# Patient Record
Sex: Female | Born: 1940 | Race: White | Hispanic: Yes | Smoking: Never smoker
Health system: Southern US, Community
[De-identification: ages and names within clinical notes are randomized; demographics above are authoritative.]

## PROBLEM LIST (undated history)

## (undated) DIAGNOSIS — I241 Dressler's syndrome: Secondary | ICD-10-CM

## (undated) DIAGNOSIS — N3000 Acute cystitis without hematuria: Secondary | ICD-10-CM

## (undated) DIAGNOSIS — F419 Anxiety disorder, unspecified: Secondary | ICD-10-CM

## (undated) DIAGNOSIS — I2510 Atherosclerotic heart disease of native coronary artery without angina pectoris: Secondary | ICD-10-CM

## (undated) DIAGNOSIS — R079 Chest pain, unspecified: Secondary | ICD-10-CM

## (undated) DIAGNOSIS — I255 Ischemic cardiomyopathy: Secondary | ICD-10-CM

## (undated) DIAGNOSIS — E119 Type 2 diabetes mellitus without complications: Secondary | ICD-10-CM

## (undated) DIAGNOSIS — I2102 ST elevation (STEMI) myocardial infarction involving left anterior descending coronary artery: Secondary | ICD-10-CM

## (undated) DIAGNOSIS — I5041 Acute combined systolic (congestive) and diastolic (congestive) heart failure: Secondary | ICD-10-CM

## (undated) DIAGNOSIS — F329 Major depressive disorder, single episode, unspecified: Secondary | ICD-10-CM

## (undated) DIAGNOSIS — I509 Heart failure, unspecified: Secondary | ICD-10-CM

## (undated) DIAGNOSIS — I5023 Acute on chronic systolic (congestive) heart failure: Secondary | ICD-10-CM

## (undated) DIAGNOSIS — R0600 Dyspnea, unspecified: Secondary | ICD-10-CM

## (undated) DIAGNOSIS — H9190 Unspecified hearing loss, unspecified ear: Secondary | ICD-10-CM

## (undated) DIAGNOSIS — I214 Non-ST elevation (NSTEMI) myocardial infarction: Secondary | ICD-10-CM

## (undated) HISTORY — DX: Heart failure, unspecified: I50.9

## (undated) HISTORY — DX: Dressler's syndrome: I24.1

## (undated) HISTORY — DX: Acute cystitis without hematuria: N30.00

## (undated) HISTORY — DX: Major depressive disorder, single episode, unspecified: F32.9

## (undated) HISTORY — DX: ST elevation (STEMI) myocardial infarction involving left anterior descending coronary artery: I21.02

## (undated) HISTORY — DX: Acute on chronic systolic (congestive) heart failure: I50.23

## (undated) HISTORY — DX: Ischemic cardiomyopathy: I25.5

## (undated) HISTORY — DX: Acute combined systolic (congestive) and diastolic (congestive) heart failure: I50.41

## (undated) HISTORY — DX: Chest pain, unspecified: R07.9

## (undated) HISTORY — DX: Atherosclerotic heart disease of native coronary artery without angina pectoris: I25.10

## (undated) HISTORY — DX: Non-ST elevation (NSTEMI) myocardial infarction: I21.4

## (undated) HISTORY — DX: Anxiety disorder, unspecified: F41.9

## (undated) HISTORY — DX: Dyspnea, unspecified: R06.00

---

## 2002-06-28 DIAGNOSIS — E119 Type 2 diabetes mellitus without complications: Secondary | ICD-10-CM

## 2002-06-28 HISTORY — DX: Type 2 diabetes mellitus without complications: E11.9

## 2004-05-29 ENCOUNTER — Ambulatory Visit: Payer: Self-pay | Admitting: Family Medicine

## 2004-07-01 ENCOUNTER — Ambulatory Visit (HOSPITAL_COMMUNITY): Admission: RE | Admit: 2004-07-01 | Discharge: 2004-07-01 | Payer: Self-pay | Admitting: Family Medicine

## 2017-10-05 ENCOUNTER — Inpatient Hospital Stay (HOSPITAL_COMMUNITY)
Admission: EM | Disposition: A | Discharge status: Home Health | Payer: Self-pay | Source: Home / Self Care | Attending: Cardiovascular Disease

## 2017-10-05 ENCOUNTER — Encounter (HOSPITAL_COMMUNITY): Payer: Self-pay | Admitting: Cardiology

## 2017-10-05 ENCOUNTER — Inpatient Hospital Stay (HOSPITAL_COMMUNITY)
Admission: EM | Admit: 2017-10-05 | Discharge: 2017-10-24 | DRG: 246 | Disposition: A | Discharge status: Home Health | Payer: Medicaid Other | Attending: Cardiovascular Disease | Admitting: Cardiovascular Disease

## 2017-10-05 ENCOUNTER — Encounter (HOSPITAL_COMMUNITY)
Admission: EM | Disposition: A | Discharge status: Home Health | Payer: Self-pay | Source: Home / Self Care | Attending: Cardiovascular Disease

## 2017-10-05 DIAGNOSIS — H9190 Unspecified hearing loss, unspecified ear: Secondary | ICD-10-CM

## 2017-10-05 DIAGNOSIS — T502X5A Adverse effect of carbonic-anhydrase inhibitors, benzothiadiazides and other diuretics, initial encounter: Secondary | ICD-10-CM | POA: Diagnosis not present

## 2017-10-05 DIAGNOSIS — E876 Hypokalemia: Secondary | ICD-10-CM | POA: Diagnosis present

## 2017-10-05 DIAGNOSIS — I2102 ST elevation (STEMI) myocardial infarction involving left anterior descending coronary artery: Secondary | ICD-10-CM

## 2017-10-05 DIAGNOSIS — N179 Acute kidney failure, unspecified: Secondary | ICD-10-CM | POA: Diagnosis present

## 2017-10-05 DIAGNOSIS — D509 Iron deficiency anemia, unspecified: Secondary | ICD-10-CM | POA: Diagnosis present

## 2017-10-05 DIAGNOSIS — Z79899 Other long term (current) drug therapy: Secondary | ICD-10-CM | POA: Diagnosis not present

## 2017-10-05 DIAGNOSIS — R0789 Other chest pain: Secondary | ICD-10-CM | POA: Diagnosis not present

## 2017-10-05 DIAGNOSIS — E1122 Type 2 diabetes mellitus with diabetic chronic kidney disease: Secondary | ICD-10-CM | POA: Diagnosis present

## 2017-10-05 DIAGNOSIS — K828 Other specified diseases of gallbladder: Secondary | ICD-10-CM | POA: Diagnosis present

## 2017-10-05 DIAGNOSIS — I255 Ischemic cardiomyopathy: Secondary | ICD-10-CM | POA: Diagnosis present

## 2017-10-05 DIAGNOSIS — I358 Other nonrheumatic aortic valve disorders: Secondary | ICD-10-CM | POA: Diagnosis present

## 2017-10-05 DIAGNOSIS — I5023 Acute on chronic systolic (congestive) heart failure: Secondary | ICD-10-CM | POA: Diagnosis present

## 2017-10-05 DIAGNOSIS — I5021 Acute systolic (congestive) heart failure: Secondary | ICD-10-CM | POA: Diagnosis not present

## 2017-10-05 DIAGNOSIS — E785 Hyperlipidemia, unspecified: Secondary | ICD-10-CM | POA: Diagnosis present

## 2017-10-05 DIAGNOSIS — N289 Disorder of kidney and ureter, unspecified: Secondary | ICD-10-CM | POA: Diagnosis not present

## 2017-10-05 DIAGNOSIS — I959 Hypotension, unspecified: Secondary | ICD-10-CM | POA: Diagnosis present

## 2017-10-05 DIAGNOSIS — N184 Chronic kidney disease, stage 4 (severe): Secondary | ICD-10-CM | POA: Diagnosis not present

## 2017-10-05 DIAGNOSIS — Z7984 Long term (current) use of oral hypoglycemic drugs: Secondary | ICD-10-CM

## 2017-10-05 DIAGNOSIS — I241 Dressler's syndrome: Secondary | ICD-10-CM | POA: Diagnosis not present

## 2017-10-05 DIAGNOSIS — R0600 Dyspnea, unspecified: Secondary | ICD-10-CM | POA: Diagnosis present

## 2017-10-05 DIAGNOSIS — D631 Anemia in chronic kidney disease: Secondary | ICD-10-CM | POA: Diagnosis present

## 2017-10-05 DIAGNOSIS — I214 Non-ST elevation (NSTEMI) myocardial infarction: Secondary | ICD-10-CM | POA: Diagnosis present

## 2017-10-05 DIAGNOSIS — I2510 Atherosclerotic heart disease of native coronary artery without angina pectoris: Secondary | ICD-10-CM | POA: Diagnosis present

## 2017-10-05 DIAGNOSIS — E222 Syndrome of inappropriate secretion of antidiuretic hormone: Secondary | ICD-10-CM | POA: Diagnosis present

## 2017-10-05 DIAGNOSIS — R109 Unspecified abdominal pain: Secondary | ICD-10-CM | POA: Diagnosis not present

## 2017-10-05 DIAGNOSIS — R1011 Right upper quadrant pain: Secondary | ICD-10-CM

## 2017-10-05 DIAGNOSIS — I252 Old myocardial infarction: Secondary | ICD-10-CM | POA: Diagnosis present

## 2017-10-05 DIAGNOSIS — R05 Cough: Secondary | ICD-10-CM

## 2017-10-05 DIAGNOSIS — R059 Cough, unspecified: Secondary | ICD-10-CM

## 2017-10-05 DIAGNOSIS — N170 Acute kidney failure with tubular necrosis: Secondary | ICD-10-CM | POA: Diagnosis not present

## 2017-10-05 DIAGNOSIS — R1013 Epigastric pain: Secondary | ICD-10-CM | POA: Diagnosis not present

## 2017-10-05 DIAGNOSIS — R1010 Upper abdominal pain, unspecified: Secondary | ICD-10-CM | POA: Diagnosis not present

## 2017-10-05 DIAGNOSIS — R0602 Shortness of breath: Secondary | ICD-10-CM | POA: Diagnosis not present

## 2017-10-05 DIAGNOSIS — Z955 Presence of coronary angioplasty implant and graft: Secondary | ICD-10-CM

## 2017-10-05 DIAGNOSIS — E871 Hypo-osmolality and hyponatremia: Secondary | ICD-10-CM

## 2017-10-05 DIAGNOSIS — I213 ST elevation (STEMI) myocardial infarction of unspecified site: Secondary | ICD-10-CM | POA: Diagnosis present

## 2017-10-05 DIAGNOSIS — D649 Anemia, unspecified: Secondary | ICD-10-CM | POA: Diagnosis not present

## 2017-10-05 HISTORY — PX: LEFT HEART CATH AND CORONARY ANGIOGRAPHY: CATH118249

## 2017-10-05 HISTORY — DX: Type 2 diabetes mellitus without complications: E11.9

## 2017-10-05 HISTORY — PX: CORONARY/GRAFT ACUTE MI REVASCULARIZATION: CATH118305

## 2017-10-05 HISTORY — DX: Unspecified hearing loss, unspecified ear: H91.90

## 2017-10-05 HISTORY — DX: ST elevation (STEMI) myocardial infarction involving left anterior descending coronary artery: I21.02

## 2017-10-05 HISTORY — DX: Ischemic cardiomyopathy: I25.5

## 2017-10-05 HISTORY — DX: Dyspnea, unspecified: R06.00

## 2017-10-05 LAB — POCT I-STAT, CHEM 8
BUN: 28 mg/dL — AB (ref 6–20)
CREATININE: 1.2 mg/dL — AB (ref 0.44–1.00)
Calcium, Ion: 1.11 mmol/L — ABNORMAL LOW (ref 1.15–1.40)
Chloride: 85 mmol/L — ABNORMAL LOW (ref 101–111)
Glucose, Bld: 295 mg/dL — ABNORMAL HIGH (ref 65–99)
HCT: 29 % — ABNORMAL LOW (ref 36.0–46.0)
Hemoglobin: 9.9 g/dL — ABNORMAL LOW (ref 12.0–15.0)
POTASSIUM: 5.5 mmol/L — AB (ref 3.5–5.1)
Sodium: 116 mmol/L — CL (ref 135–145)
TCO2: 20 mmol/L — ABNORMAL LOW (ref 22–32)

## 2017-10-05 LAB — COMPREHENSIVE METABOLIC PANEL
ALT: 13 U/L — ABNORMAL LOW (ref 14–54)
AST: 15 U/L (ref 15–41)
Albumin: 2 g/dL — ABNORMAL LOW (ref 3.5–5.0)
Alkaline Phosphatase: 47 U/L (ref 38–126)
Anion gap: 20 — ABNORMAL HIGH (ref 5–15)
BUN: 21 mg/dL — ABNORMAL HIGH (ref 6–20)
CO2: 11 mmol/L — ABNORMAL LOW (ref 22–32)
Calcium: 5.3 mg/dL — CL (ref 8.9–10.3)
Chloride: 102 mmol/L (ref 101–111)
Creatinine, Ser: 0.77 mg/dL (ref 0.44–1.00)
GFR calc Af Amer: >60 mL/min (ref >60)
GFR calc non Af Amer: >60 mL/min (ref >60)
Glucose, Bld: 216 mg/dL — ABNORMAL HIGH (ref 65–99)
Potassium: 3.2 mmol/L — ABNORMAL LOW (ref 3.5–5.1)
Sodium: 133 mmol/L — ABNORMAL LOW (ref 135–145)
Total Bilirubin: 0.3 mg/dL (ref 0.3–1.2)
Total Protein: 4.3 g/dL — ABNORMAL LOW (ref 6.5–8.1)

## 2017-10-05 LAB — CBC WITH DIFFERENTIAL/PLATELET
Basophils Absolute: 0 10*3/uL (ref 0.0–0.1)
Basophils Relative: 0 %
Eosinophils Absolute: 0 10*3/uL (ref 0.0–0.7)
Eosinophils Relative: 0 %
HCT: 27.6 % — ABNORMAL LOW (ref 36.0–46.0)
Hemoglobin: 9.6 g/dL — ABNORMAL LOW (ref 12.0–15.0)
Lymphocytes Relative: 17 %
Lymphs Abs: 1.6 10*3/uL (ref 0.7–4.0)
MCH: 27.7 pg (ref 26.0–34.0)
MCHC: 34.8 g/dL (ref 30.0–36.0)
MCV: 79.5 fL (ref 78.0–100.0)
Monocytes Absolute: 0.4 10*3/uL (ref 0.1–1.0)
Monocytes Relative: 5 %
Neutro Abs: 7.4 10*3/uL (ref 1.7–7.7)
Neutrophils Relative %: 78 %
Platelets: 493 10*3/uL — ABNORMAL HIGH (ref 150–400)
RBC: 3.47 MIL/uL — ABNORMAL LOW (ref 3.87–5.11)
RDW: 13 % (ref 11.5–15.5)
WBC: 9.4 10*3/uL (ref 4.0–10.5)

## 2017-10-05 LAB — CBC
HEMATOCRIT: 27 % — AB (ref 36.0–46.0)
Hemoglobin: 9.4 g/dL — ABNORMAL LOW (ref 12.0–15.0)
MCH: 27.6 pg (ref 26.0–34.0)
MCHC: 34.8 g/dL (ref 30.0–36.0)
MCV: 79.4 fL (ref 78.0–100.0)
PLATELETS: 485 10*3/uL — AB (ref 150–400)
RBC: 3.4 MIL/uL — ABNORMAL LOW (ref 3.87–5.11)
RDW: 13 % (ref 11.5–15.5)
WBC: 8.5 10*3/uL (ref 4.0–10.5)

## 2017-10-05 LAB — PROTIME-INR
INR: 1.02
Prothrombin Time: 13.3 seconds (ref 11.4–15.2)

## 2017-10-05 LAB — LIPID PANEL
CHOLESTEROL: 157 mg/dL (ref 0–200)
HDL: 47 mg/dL (ref >40)
LDL Cholesterol: 86 mg/dL (ref 0–99)
Total CHOL/HDL Ratio: 3.3 RATIO
Triglycerides: 119 mg/dL (ref <150)
VLDL: 24 mg/dL (ref 0–40)

## 2017-10-05 LAB — MRSA PCR SCREENING: MRSA by PCR: NEGATIVE

## 2017-10-05 LAB — HEMOGLOBIN A1C
Hgb A1c MFr Bld: 7.6 % — ABNORMAL HIGH (ref 4.8–5.6)
Hgb A1c MFr Bld: 6.6 % — ABNORMAL HIGH (ref 4.8–5.6)
MEAN PLASMA GLUCOSE: 171.42 mg/dL
Mean Plasma Glucose: 142.72 mg/dL

## 2017-10-05 LAB — POCT ACTIVATED CLOTTING TIME
ACTIVATED CLOTTING TIME: 186 s
Activated Clotting Time: 290 seconds

## 2017-10-05 LAB — TROPONIN I
Troponin I: 1.46 ng/mL (ref <0.03)
Troponin I: 1.2 ng/mL (ref <0.03)

## 2017-10-05 LAB — TSH: TSH: 3.377 u[IU]/mL (ref 0.350–4.500)

## 2017-10-05 LAB — GLUCOSE, CAPILLARY: Glucose-Capillary: 303 mg/dL — ABNORMAL HIGH (ref 65–99)

## 2017-10-05 LAB — APTT: APTT: 29 s (ref 24–36)

## 2017-10-05 LAB — BRAIN NATRIURETIC PEPTIDE: B Natriuretic Peptide: 3798.7 pg/mL — ABNORMAL HIGH (ref 0.0–100.0)

## 2017-10-05 SURGERY — LEFT HEART CATH AND CORONARY ANGIOGRAPHY
Anesthesia: LOCAL

## 2017-10-05 MED ORDER — HEPARIN (PORCINE) IN NACL 2-0.9 UNIT/ML-% IJ SOLN
INTRAMUSCULAR | Status: AC | PRN
  Administered 2017-10-05 (×2): 500 mL

## 2017-10-05 MED ORDER — NITROGLYCERIN 0.4 MG SL SUBL
0.4000 mg | SUBLINGUAL_TABLET | SUBLINGUAL | Status: DC | PRN
  Administered 2017-10-08 – 2017-10-09 (×2): 0.4 mg via SUBLINGUAL
  Filled 2017-10-05 (×3): qty 1

## 2017-10-05 MED ORDER — TIROFIBAN (AGGRASTAT) BOLUS VIA INFUSION
INTRAVENOUS | Status: DC | PRN
  Administered 2017-10-05: 1500 ug via INTRAVENOUS

## 2017-10-05 MED ORDER — ONDANSETRON HCL 4 MG/2ML IJ SOLN
4.0000 mg | Freq: Four times a day (QID) | INTRAMUSCULAR | Status: DC | PRN
  Administered 2017-10-08 – 2017-10-09 (×2): 4 mg via INTRAVENOUS
  Filled 2017-10-05 (×2): qty 2

## 2017-10-05 MED ORDER — LIDOCAINE HCL (PF) 1 % IJ SOLN
INTRAMUSCULAR | Status: DC | PRN
  Administered 2017-10-05: 15 mL via INTRADERMAL

## 2017-10-05 MED ORDER — ZOLPIDEM TARTRATE 5 MG PO TABS
5.0000 mg | ORAL_TABLET | Freq: Every evening | ORAL | Status: DC | PRN
  Administered 2017-10-13 – 2017-10-24 (×2): 5 mg via ORAL
  Filled 2017-10-05 (×2): qty 1

## 2017-10-05 MED ORDER — IOPAMIDOL (ISOVUE-370) INJECTION 76%
INTRAVENOUS | Status: AC
  Filled 2017-10-05: qty 125

## 2017-10-05 MED ORDER — ATORVASTATIN CALCIUM 80 MG PO TABS: 80.0000 mg | ORAL_TABLET | Freq: Every day | ORAL | Status: DC

## 2017-10-05 MED ORDER — TIROFIBAN HCL IN NACL 5-0.9 MG/100ML-% IV SOLN
INTRAVENOUS | Status: AC
  Filled 2017-10-05: qty 100

## 2017-10-05 MED ORDER — HYDRALAZINE HCL 20 MG/ML IJ SOLN: 5.0000 mg | INTRAMUSCULAR | Status: AC | PRN

## 2017-10-05 MED ORDER — INSULIN ASPART 100 UNIT/ML ~~LOC~~ SOLN
0.0000 [IU] | Freq: Every day | SUBCUTANEOUS | Status: DC
  Administered 2017-10-05: 4 [IU] via SUBCUTANEOUS
  Administered 2017-10-06: 2 [IU] via SUBCUTANEOUS

## 2017-10-05 MED ORDER — LIDOCAINE HCL (PF) 1 % IJ SOLN
INTRAMUSCULAR | Status: AC
  Filled 2017-10-05: qty 30

## 2017-10-05 MED ORDER — FUROSEMIDE 10 MG/ML IJ SOLN
INTRAMUSCULAR | Status: AC
  Filled 2017-10-05: qty 4

## 2017-10-05 MED ORDER — ATORVASTATIN CALCIUM 80 MG PO TABS
80.0000 mg | ORAL_TABLET | Freq: Every day | ORAL | Status: DC
  Administered 2017-10-06 – 2017-10-23 (×16): 80 mg via ORAL
  Filled 2017-10-05 (×16): qty 1

## 2017-10-05 MED ORDER — ASPIRIN 81 MG PO CHEW
81.0000 mg | CHEWABLE_TABLET | Freq: Every day | ORAL | Status: DC
  Administered 2017-10-06 – 2017-10-24 (×19): 81 mg via ORAL
  Filled 2017-10-05 (×19): qty 1

## 2017-10-05 MED ORDER — HEPARIN (PORCINE) IN NACL 2-0.9 UNIT/ML-% IJ SOLN
INTRAMUSCULAR | Status: AC
  Filled 2017-10-05: qty 1000

## 2017-10-05 MED ORDER — FENTANYL CITRATE (PF) 100 MCG/2ML IJ SOLN
INTRAMUSCULAR | Status: DC | PRN
  Administered 2017-10-05 (×2): 25 ug via INTRAVENOUS

## 2017-10-05 MED ORDER — TICAGRELOR 90 MG PO TABS
ORAL_TABLET | ORAL | Status: DC | PRN
  Administered 2017-10-05: 180 mg via ORAL

## 2017-10-05 MED ORDER — BIVALIRUDIN TRIFLUOROACETATE 250 MG IV SOLR
INTRAVENOUS | Status: AC
  Filled 2017-10-05: qty 250

## 2017-10-05 MED ORDER — MIDAZOLAM HCL 2 MG/2ML IJ SOLN
INTRAMUSCULAR | Status: DC | PRN
  Administered 2017-10-05 (×2): 1 mg via INTRAVENOUS

## 2017-10-05 MED ORDER — BIVALIRUDIN BOLUS VIA INFUSION - CUPID
INTRAVENOUS | Status: DC | PRN
  Administered 2017-10-05: 41.25 mg via INTRAVENOUS

## 2017-10-05 MED ORDER — ACETAMINOPHEN 325 MG PO TABS
650.0000 mg | ORAL_TABLET | ORAL | Status: DC | PRN
  Administered 2017-10-12 – 2017-10-23 (×4): 650 mg via ORAL
  Filled 2017-10-05 (×4): qty 2

## 2017-10-05 MED ORDER — FENTANYL CITRATE (PF) 100 MCG/2ML IJ SOLN
INTRAMUSCULAR | Status: AC
  Filled 2017-10-05: qty 2

## 2017-10-05 MED ORDER — ZOLPIDEM TARTRATE 5 MG PO TABS: 5.0000 mg | ORAL_TABLET | Freq: Every evening | ORAL | Status: DC | PRN

## 2017-10-05 MED ORDER — NITROGLYCERIN 1 MG/10 ML FOR IR/CATH LAB
INTRA_ARTERIAL | Status: AC
  Filled 2017-10-05: qty 10

## 2017-10-05 MED ORDER — TIROFIBAN HCL IN NACL 5-0.9 MG/100ML-% IV SOLN
INTRAVENOUS | Status: AC | PRN
  Administered 2017-10-05: 0.075 ug/kg/min via INTRAVENOUS

## 2017-10-05 MED ORDER — ONDANSETRON HCL 4 MG/2ML IJ SOLN: 4.0000 mg | Freq: Four times a day (QID) | INTRAMUSCULAR | Status: DC | PRN

## 2017-10-05 MED ORDER — TICAGRELOR 90 MG PO TABS
ORAL_TABLET | ORAL | Status: AC
  Filled 2017-10-05: qty 4

## 2017-10-05 MED ORDER — LABETALOL HCL 5 MG/ML IV SOLN: 10.0000 mg | INTRAVENOUS | Status: AC | PRN

## 2017-10-05 MED ORDER — ASPIRIN EC 81 MG PO TBEC: 81.0000 mg | DELAYED_RELEASE_TABLET | Freq: Every day | ORAL | Status: DC

## 2017-10-05 MED ORDER — VERAPAMIL HCL 2.5 MG/ML IV SOLN
INTRAVENOUS | Status: DC | PRN
Start: 2017-10-05 — End: 2017-10-05
  Administered 2017-10-05: 200 ug via INTRACORONARY

## 2017-10-05 MED ORDER — ALPRAZOLAM 0.25 MG PO TABS
0.2500 mg | ORAL_TABLET | Freq: Two times a day (BID) | ORAL | Status: DC | PRN
  Administered 2017-10-05: 0.25 mg via ORAL
  Filled 2017-10-05: qty 1

## 2017-10-05 MED ORDER — NITROGLYCERIN IN D5W 200-5 MCG/ML-% IV SOLN
INTRAVENOUS | Status: AC | PRN
  Administered 2017-10-05: 10 ug/min via INTRAVENOUS

## 2017-10-05 MED ORDER — SODIUM CHLORIDE 0.9% FLUSH: 3.0000 mL | INTRAVENOUS | Status: DC | PRN

## 2017-10-05 MED ORDER — SODIUM CHLORIDE 0.9 % IV SOLN: INTRAVENOUS | Status: AC

## 2017-10-05 MED ORDER — NITROGLYCERIN 1 MG/10 ML FOR IR/CATH LAB
INTRA_ARTERIAL | Status: DC | PRN
  Administered 2017-10-05 (×3): 200 ug via INTRACORONARY
  Administered 2017-10-05: 150 ug via INTRACORONARY

## 2017-10-05 MED ORDER — ACETAMINOPHEN 325 MG PO TABS: 650.0000 mg | ORAL_TABLET | ORAL | Status: DC | PRN

## 2017-10-05 MED ORDER — DIAZEPAM 5 MG PO TABS
5.0000 mg | ORAL_TABLET | Freq: Four times a day (QID) | ORAL | Status: DC | PRN
  Administered 2017-10-06 – 2017-10-15 (×9): 5 mg via ORAL
  Filled 2017-10-05 (×10): qty 1

## 2017-10-05 MED ORDER — SODIUM CHLORIDE 0.9% FLUSH
3.0000 mL | Freq: Two times a day (BID) | INTRAVENOUS | Status: DC
  Administered 2017-10-05 – 2017-10-18 (×24): 3 mL via INTRAVENOUS
  Administered 2017-10-19: 21:00:00 via INTRAVENOUS
  Administered 2017-10-20 – 2017-10-24 (×7): 3 mL via INTRAVENOUS

## 2017-10-05 MED ORDER — FUROSEMIDE 10 MG/ML IJ SOLN
INTRAMUSCULAR | Status: DC | PRN
  Administered 2017-10-05: 20 mg via INTRAVENOUS

## 2017-10-05 MED ORDER — TICAGRELOR 90 MG PO TABS
90.0000 mg | ORAL_TABLET | Freq: Two times a day (BID) | ORAL | Status: DC
  Administered 2017-10-06 – 2017-10-24 (×36): 90 mg via ORAL
  Filled 2017-10-05 (×38): qty 1

## 2017-10-05 MED ORDER — MIDAZOLAM HCL 2 MG/2ML IJ SOLN
INTRAMUSCULAR | Status: AC
  Filled 2017-10-05: qty 2

## 2017-10-05 MED ORDER — SODIUM CHLORIDE 0.9 % IV SOLN: 250.0000 mL | INTRAVENOUS | Status: DC | PRN

## 2017-10-05 MED ORDER — NITROGLYCERIN IN D5W 200-5 MCG/ML-% IV SOLN
0.0000 ug/min | INTRAVENOUS | Status: DC
  Filled 2017-10-05: qty 250

## 2017-10-05 MED ORDER — SODIUM CHLORIDE 0.9 % IV SOLN
INTRAVENOUS | Status: AC | PRN
  Administered 2017-10-05: 1.75 mg/kg/h via INTRAVENOUS

## 2017-10-05 MED ORDER — SODIUM CHLORIDE 0.9% FLUSH
3.0000 mL | INTRAVENOUS | Status: DC | PRN
  Administered 2017-10-22: 3 mL via INTRAVENOUS
  Filled 2017-10-05: qty 3

## 2017-10-05 MED ORDER — INSULIN ASPART 100 UNIT/ML ~~LOC~~ SOLN
0.0000 [IU] | Freq: Three times a day (TID) | SUBCUTANEOUS | Status: DC
  Administered 2017-10-06: 3 [IU] via SUBCUTANEOUS
  Administered 2017-10-06: 2 [IU] via SUBCUTANEOUS
  Administered 2017-10-07: 3 [IU] via SUBCUTANEOUS
  Administered 2017-10-07 – 2017-10-08 (×3): 1 [IU] via SUBCUTANEOUS
  Administered 2017-10-09 – 2017-10-10 (×2): 2 [IU] via SUBCUTANEOUS
  Administered 2017-10-10 (×2): 1 [IU] via SUBCUTANEOUS
  Administered 2017-10-11 – 2017-10-13 (×5): 2 [IU] via SUBCUTANEOUS
  Administered 2017-10-14: 5 [IU] via SUBCUTANEOUS
  Administered 2017-10-15 – 2017-10-17 (×5): 2 [IU] via SUBCUTANEOUS
  Administered 2017-10-17: 3 [IU] via SUBCUTANEOUS
  Administered 2017-10-18: 2 [IU] via SUBCUTANEOUS
  Administered 2017-10-18: 1 [IU] via SUBCUTANEOUS
  Administered 2017-10-18 – 2017-10-19 (×2): 2 [IU] via SUBCUTANEOUS
  Administered 2017-10-19: 3 [IU] via SUBCUTANEOUS
  Administered 2017-10-19: 1 [IU] via SUBCUTANEOUS
  Administered 2017-10-20: 2 [IU] via SUBCUTANEOUS
  Administered 2017-10-20: 3 [IU] via SUBCUTANEOUS
  Administered 2017-10-20 – 2017-10-21 (×2): 2 [IU] via SUBCUTANEOUS
  Administered 2017-10-21: 1 [IU] via SUBCUTANEOUS
  Administered 2017-10-21 – 2017-10-22 (×4): 2 [IU] via SUBCUTANEOUS
  Administered 2017-10-23: 1 [IU] via SUBCUTANEOUS
  Administered 2017-10-23: 2 [IU] via SUBCUTANEOUS
  Administered 2017-10-23 – 2017-10-24 (×2): 1 [IU] via SUBCUTANEOUS

## 2017-10-05 MED ORDER — METOPROLOL TARTRATE 12.5 MG HALF TABLET
12.5000 mg | ORAL_TABLET | Freq: Two times a day (BID) | ORAL | Status: DC
  Administered 2017-10-05 – 2017-10-19 (×28): 12.5 mg via ORAL
  Filled 2017-10-05 (×28): qty 1

## 2017-10-05 SURGICAL SUPPLY — 19 items
BALLN SAPPHIRE 2.0X12 (BALLOONS) ×2
BALLN SAPPHIRE 2.5X20 (BALLOONS) ×2
BALLN ~~LOC~~ EUPHORA RX 2.75X15 (BALLOONS) ×2
BALLOON SAPPHIRE 2.0X12 (BALLOONS) IMPLANT
BALLOON SAPPHIRE 2.5X20 (BALLOONS) IMPLANT
BALLOON ~~LOC~~ EUPHORA RX 2.75X15 (BALLOONS) IMPLANT
CATH INFINITI 5FR MULTPACK ANG (CATHETERS) ×1 IMPLANT
CATH VISTA GUIDE 6FR XBLAD3.5 (CATHETERS) ×1 IMPLANT
ELECT DEFIB PAD ADLT CADENCE (PAD) ×1 IMPLANT
KIT ENCORE 26 ADVANTAGE (KITS) ×1 IMPLANT
KIT HEART LEFT (KITS) ×2 IMPLANT
PACK CARDIAC CATHETERIZATION (CUSTOM PROCEDURE TRAY) ×2 IMPLANT
SHEATH AVANTI 11CM 6FR (SHEATH) ×1 IMPLANT
STENT RESOLUTE ONYX 2.25X12 (Permanent Stent) ×1 IMPLANT
STENT RESOLUTE ONYX 2.5X26 (Permanent Stent) ×1 IMPLANT
TRANSDUCER W/STOPCOCK (MISCELLANEOUS) ×2 IMPLANT
TUBING CIL FLEX 10 FLL-RA (TUBING) ×2 IMPLANT
WIRE EMERALD 3MM-J .035X150CM (WIRE) ×1 IMPLANT
WIRE PT2 MS 185 (WIRE) ×1 IMPLANT

## 2017-10-05 NOTE — H&P (Addendum)
Cardiology Admission History and Physical:   Patient ID: Sheila White; MRN: [id number]; DOB: 1941-03-31   Admission date: 10/05/2017  Primary Care Provider: No primary care provider on file. Primary Cardiologist: No primary care provider on file.  Chief Complaint:  SOB  Patient Profile:   Sheila White is a 77 y.o.Hispanic female who presented to the ED with dyspnea via EMS.  History of Present Illness:   Sheila. Sheila White does not speak English and is hard of hearing. Her history is obtained from interview with the family and information provided from the patient. Sheila White began feeling poorly a week ago. She was seen by her PCP and told she was "Ok". She continued to feel poorly-SOB and Rt back pain. Her daughter brought her back to her PCP yesterday and she was placed on ABs for suspected CAP. Around 5 am this morning the patient developed back pain that radiated to her chest. This lasted till around 10 am. After this the pain eased but she was increasingly SOB and EMS was called when it was apparent she couldn't walk to her car to go to the doctor's office.   In the ED her EKG shows anterior Qs with anterior ST elevation. She was examined and interviewed by Dr Tresa Endo in the ED. NTG and O2 provided by EMS did help her symptoms but she was still SOB at rest. It was decided to take her up to the cath lab fas a STEMI for further evaluation.   Past Medical History:  Diagnosis Date  . HOH (hard of hearing)   . Non-insulin dependent type 2 diabetes mellitus (HCC)   -The patient denied any serious medical problems  History reviewed. No pertinent surgical history. The pt denies any major surgery  Medications Prior to Admission: Prior to Admission medications  Glucophage Glimeperide 2mg    Not on File     Allergies:   No Known Allergies  Social History:   Widowed 2 years   Family History: no family history of early CAD   ROS:  Please see the history of present illness.  No history of  bleeding issues All other ROS reviewed and negative.     Physical Exam/Data:  There were no vitals filed for this visit. No intake or output data in the 24 hours ending 10/05/17 1814 There were no vitals filed for this visit. There is no height or weight on file to calculate BMI.  General:  Well nourished, well developed, in moderate distress HEENT: normal Lymph: no adenopathy Neck: noJVD Endocrine:  No thryomegaly Vascular: No carotid bruits; FA pulses 2+ bilaterally without bruits  Cardiac:  normal S1, S2; RRR; no murmur  Lungs:  Decreased breath sounds Abd: soft, nontender, no hepatomegaly  Ext: no edema Musculoskeletal:  No deformities, BUE and BLE strength normal and equal Skin: warm and dry  Neuro:  CNs 2-12 intact, no focal abnormalities noted Psych:  Normal affect    EKG:  The ECG that was done by EMS was personally reviewed and demonstrates NSR with anterior Qs and ST elevation  Relevant CV Studies:  Laboratory Data:  ChemistryNo results for input(s): NA, K, CL, CO2, GLUCOSE, BUN, CREATININE, CALCIUM, GFRNONAA, GFRAA, ANIONGAP in the last 168 hours.  No results for input(s): PROT, ALBUMIN, AST, ALT, ALKPHOS, BILITOT in the last 168 hours. HematologyNo results for input(s): WBC, RBC, HGB, HCT, MCV, MCH, MCHC, RDW, PLT in the last 168 hours. Cardiac EnzymesNo results for input(s): TROPONINI in the last 168 hours. No results for input(s):  TROPIPOC in the last 168 hours.  BNPNo results for input(s): BNP, PROBNP in the last 168 hours.  DDimer No results for input(s): DDIMER in the last 168 hours.  Radiology/Studies:  No results found.  Assessment and Plan:   Anterior STEMI- late presentation  NIDDM- on oral agents  HOH  Plan: Urgent cath  Severity of Illness: The appropriate patient status for this patient is INPATIENT. Inpatient status is judged to be reasonable and necessary in order to provide the required intensity of service to ensure the patient's safety.  The patient's presenting symptoms, physical exam findings, and initial radiographic and laboratory data in the context of their chronic comorbidities is felt to place them at high risk for further clinical deterioration. Furthermore, it is not anticipated that the patient will be medically stable for discharge from the hospital within 2 midnights of admission. The following factors support the patient status of inpatient.   " The patient's presenting symptoms include SOB . " The worrisome physical exam findings include decreased breath breath sounds. " The initial radiographic and laboratory data are worrisome because of EKG-anterior Qs, anterior ST evevation. " The chronic co-morbidities include Diabtetes.   * I certify that at the point of admission it is my clinical judgment that the patient will require inpatient hospital care spanning beyond 2 midnights from the point of admission due to high intensity of service, high risk for further deterioration and high frequency of surveillance required.*    For questions or updates, please contact [facility] Please consult [email or web address] for contact info under Cardiology/STEMI.    Sheila ProvostSigned, Luke Kilroy, PA-C  10/05/2017 6:14 PM    Patient seen and examined. Agree with assessment and plan.  Sheila White is a 77 year old Hispanic female who was originally from [place].  She has a history of diabetes mellitus.  She developed increasing fatigability approximately 1 week ago.  Yesterday and this morning she developed chest and back discomfort.  She has significant back pain rating to her chest this morning for a proximally 5 hours.  Throughout the day.  She was noticing more shortness of breath.  [facility]  She was complaining of significant shortness of breath with residual upper abdominal/chest discomfort.  Her ECG has shown anterior Q waves with residual ST elevation suggesting delayed presentation LAD ST segment elevation MI.   With ongoing symptomatology she is now taken acutely to the cardiac catheterization laboratory.     Lennette Biharihomas A. Keller Bounds, MD, [facility]LL[facility] HospitalFACC 10/05/2017 7:29 PM

## 2017-10-05 NOTE — Progress Notes (Signed)
Orthopedic Tech Progress Note Patient Details:  Sheila White [birth year][name]940 [id number]  Ortho Devices Type of Ortho Device: Knee Immobilizer Ortho Device/Splint Location: delivered to rn Ortho Device/Splint Interventions: Sheila BraveOrdered       Sheila White 10/05/2017, 10:21 PM

## 2017-10-06 ENCOUNTER — Inpatient Hospital Stay (HOSPITAL_COMMUNITY): Payer: Medicaid Other

## 2017-10-06 ENCOUNTER — Encounter (HOSPITAL_COMMUNITY): Payer: Self-pay | Admitting: Cardiovascular Disease

## 2017-10-06 ENCOUNTER — Other Ambulatory Visit (HOSPITAL_COMMUNITY): Payer: Self-pay

## 2017-10-06 ENCOUNTER — Other Ambulatory Visit: Payer: Self-pay

## 2017-10-06 DIAGNOSIS — I2102 ST elevation (STEMI) myocardial infarction involving left anterior descending coronary artery: Secondary | ICD-10-CM

## 2017-10-06 DIAGNOSIS — I2510 Atherosclerotic heart disease of native coronary artery without angina pectoris: Secondary | ICD-10-CM

## 2017-10-06 DIAGNOSIS — I241 Dressler's syndrome: Secondary | ICD-10-CM

## 2017-10-06 DIAGNOSIS — R0602 Shortness of breath: Secondary | ICD-10-CM

## 2017-10-06 LAB — LIPID PANEL
Cholesterol: 161 mg/dL (ref 0–200)
HDL: 49 mg/dL (ref >40)
LDL Cholesterol: 90 mg/dL (ref 0–99)
Total CHOL/HDL Ratio: 3.3 RATIO
Triglycerides: 110 mg/dL (ref <150)
VLDL: 22 mg/dL (ref 0–40)

## 2017-10-06 LAB — CBC
HEMATOCRIT: 26.4 % — AB (ref 36.0–46.0)
HEMOGLOBIN: 9.2 g/dL — AB (ref 12.0–15.0)
MCH: 27.7 pg (ref 26.0–34.0)
MCHC: 34.8 g/dL (ref 30.0–36.0)
MCV: 79.5 fL (ref 78.0–100.0)
Platelets: 468 10*3/uL — ABNORMAL HIGH (ref 150–400)
RBC: 3.32 MIL/uL — AB (ref 3.87–5.11)
RDW: 13 % (ref 11.5–15.5)
WBC: 9.5 10*3/uL (ref 4.0–10.5)

## 2017-10-06 LAB — COMPREHENSIVE METABOLIC PANEL
ALT: 19 U/L (ref 14–54)
AST: 22 U/L (ref 15–41)
Albumin: 2.9 g/dL — ABNORMAL LOW (ref 3.5–5.0)
Alkaline Phosphatase: 71 U/L (ref 38–126)
Anion gap: 13 (ref 5–15)
BILIRUBIN TOTAL: 0.2 mg/dL — AB (ref 0.3–1.2)
BUN: 29 mg/dL — AB (ref 6–20)
CHLORIDE: 85 mmol/L — AB (ref 101–111)
CO2: 17 mmol/L — ABNORMAL LOW (ref 22–32)
CREATININE: 1.2 mg/dL — AB (ref 0.44–1.00)
Calcium: 8.1 mg/dL — ABNORMAL LOW (ref 8.9–10.3)
GFR calc Af Amer: 48 mL/min — ABNORMAL LOW (ref >60)
GFR, EST NON AFRICAN AMERICAN: 42 mL/min — AB (ref >60)
Glucose, Bld: 286 mg/dL — ABNORMAL HIGH (ref 65–99)
Potassium: 5.5 mmol/L — ABNORMAL HIGH (ref 3.5–5.1)
Sodium: 115 mmol/L — CL (ref 135–145)
Total Protein: 6.3 g/dL — ABNORMAL LOW (ref 6.5–8.1)

## 2017-10-06 LAB — POCT ACTIVATED CLOTTING TIME: Activated Clotting Time: 147 seconds

## 2017-10-06 LAB — SODIUM, URINE, RANDOM: Sodium, Ur: 59 mmol/L

## 2017-10-06 LAB — HEPATIC FUNCTION PANEL
ALK PHOS: 67 U/L (ref 38–126)
ALT: 18 U/L (ref 14–54)
AST: 25 U/L (ref 15–41)
Albumin: 2.9 g/dL — ABNORMAL LOW (ref 3.5–5.0)
BILIRUBIN TOTAL: 0.4 mg/dL (ref 0.3–1.2)
Bilirubin, Direct: <0.1 mg/dL — ABNORMAL LOW (ref 0.1–0.5)
TOTAL PROTEIN: 6.2 g/dL — AB (ref 6.5–8.1)

## 2017-10-06 LAB — BASIC METABOLIC PANEL
Anion gap: 13 (ref 5–15)
BUN: 29 mg/dL — ABNORMAL HIGH (ref 6–20)
CO2: 20 mmol/L — ABNORMAL LOW (ref 22–32)
Calcium: 8.2 mg/dL — ABNORMAL LOW (ref 8.9–10.3)
Chloride: 83 mmol/L — ABNORMAL LOW (ref 101–111)
Creatinine, Ser: 1.27 mg/dL — ABNORMAL HIGH (ref 0.44–1.00)
GFR calc Af Amer: 45 mL/min — ABNORMAL LOW (ref >60)
GFR calc non Af Amer: 39 mL/min — ABNORMAL LOW (ref >60)
Glucose, Bld: 157 mg/dL — ABNORMAL HIGH (ref 65–99)
Potassium: 4.7 mmol/L (ref 3.5–5.1)
Sodium: 116 mmol/L — CL (ref 135–145)

## 2017-10-06 LAB — OSMOLALITY, URINE: OSMOLALITY UR: 312 mosm/kg (ref 300–900)

## 2017-10-06 LAB — GLUCOSE, CAPILLARY
GLUCOSE-CAPILLARY: 200 mg/dL — AB (ref 65–99)
GLUCOSE-CAPILLARY: 221 mg/dL — AB (ref 65–99)
GLUCOSE-CAPILLARY: 216 mg/dL — AB (ref 65–99)

## 2017-10-06 LAB — ECHOCARDIOGRAM COMPLETE: Weight: 1975.32 oz

## 2017-10-06 LAB — BRAIN NATRIURETIC PEPTIDE: B Natriuretic Peptide: 2455.9 pg/mL — ABNORMAL HIGH (ref 0.0–100.0)

## 2017-10-06 LAB — TROPONIN I: Troponin I: 1.58 ng/mL (ref <0.03)

## 2017-10-06 MED ORDER — MORPHINE SULFATE (PF) 2 MG/ML IV SOLN
2.0000 mg | INTRAVENOUS | Status: DC | PRN
  Administered 2017-10-06 – 2017-10-13 (×10): 2 mg via INTRAVENOUS
  Filled 2017-10-06 (×12): qty 1

## 2017-10-06 MED ORDER — SODIUM CHLORIDE 0.9 % IV SOLN
4.0000 mg | Freq: Once | INTRAVENOUS | Status: AC
  Administered 2017-10-06: 4 mg via INTRAVENOUS
  Filled 2017-10-06: qty 1

## 2017-10-06 MED ORDER — TIROFIBAN HCL IN NACL 5-0.9 MG/100ML-% IV SOLN
0.0750 ug/kg/min | INTRAVENOUS | Status: AC
  Administered 2017-10-06: 0.075 ug/kg/min via INTRAVENOUS
  Filled 2017-10-06: qty 100

## 2017-10-06 MED ORDER — PNEUMOCOCCAL VAC POLYVALENT 25 MCG/0.5ML IJ INJ: 0.5000 mL | INJECTION | INTRAMUSCULAR | Status: DC

## 2017-10-06 MED ORDER — FUROSEMIDE 10 MG/ML IJ SOLN
40.0000 mg | Freq: Two times a day (BID) | INTRAMUSCULAR | Status: DC
  Administered 2017-10-06 – 2017-10-07 (×2): 40 mg via INTRAVENOUS
  Filled 2017-10-06 (×2): qty 4

## 2017-10-06 MED ORDER — COLCHICINE 0.6 MG PO TABS
0.6000 mg | ORAL_TABLET | Freq: Two times a day (BID) | ORAL | Status: DC
  Administered 2017-10-06 – 2017-10-07 (×4): 0.6 mg via ORAL
  Filled 2017-10-06 (×4): qty 1

## 2017-10-06 MED ORDER — FUROSEMIDE 10 MG/ML IJ SOLN
40.0000 mg | Freq: Once | INTRAMUSCULAR | Status: AC
  Administered 2017-10-06: 40 mg via INTRAVENOUS
  Filled 2017-10-06: qty 4

## 2017-10-06 MED FILL — Heparin Sodium (Porcine) 2 Unit/ML in Sodium Chloride 0.9%: INTRAMUSCULAR | Qty: 1000 | Status: AC

## 2017-10-06 NOTE — Progress Notes (Addendum)
Progress Note  Patient Name: Sheila White Date of Encounter: 10/06/2017  Primary Cardiologist: Nicki Guadalajara  Subjective   Not feeling well.  Language barrier.  Hurts to take a deep breath.  Feels short of breath.  Hurts all over.  Very anxious and moving around.  No assessment of LV function.  Slow flow post stenting.  Inpatient Medications    Scheduled Meds: . aspirin  81 mg Oral Daily  . atorvastatin  80 mg Oral q1800  . insulin aspart  0-5 Units Subcutaneous QHS  . insulin aspart  0-9 Units Subcutaneous TID WC  . metoprolol tartrate  12.5 mg Oral BID  . sodium chloride flush  3 mL Intravenous Q12H  . ticagrelor  90 mg Oral BID   Continuous Infusions: . sodium chloride    . nitroGLYCERIN 40 mcg/min (10/06/17 0250)  . tirofiban 0.075 mcg/kg/min (10/06/17 0700)   PRN Meds: sodium chloride, acetaminophen, diazepam, nitroGLYCERIN, ondansetron (ZOFRAN) IV, sodium chloride flush, zolpidem   Vital Signs    Vitals:   10/06/17 0715 10/06/17 0745 10/06/17 0800 10/06/17 0804  BP: 133/69 132/81 130/86   Pulse: 95  93   Resp: (!) 25 16 (!) 21   Temp:    97.6 F (36.4 C)  TempSrc:    Oral  SpO2: 95%  96%   Weight:        Intake/Output Summary (Last 24 hours) at 10/06/2017 0850 Last data filed at 10/06/2017 0700 Gross per 24 hour  Intake 428.57 ml  Output 2250 ml  Net -1821.43 ml   Filed Weights   10/05/17 2200 10/06/17 0500  Weight: 129 lb 10.1 oz (58.8 kg) 123 lb 7.3 oz (56 kg)    Telemetry    Normal sinus rhythm.- Personally Reviewed  ECG    Sinus rhythm, persistent ST elevation, QS pattern V1 through V5.  When compared to preintervention EKGs from yesterday no change has occurred.- Personally Reviewed  Physical Exam  Latino female who is uncomfortable and moving about in bed. GEN: No acute distress.   Neck: No JVD Cardiac: RRR, no murmurs, rubs, or gallops.  Respiratory: Clear to auscultation bilaterally.  Decreased breath sounds bilaterally. GI: Soft,  nontender, non-distended  MS: No edema; No deformity. Neuro:  Nonfocal  Psych: Normal affect   Labs    Chemistry Recent Labs  Lab 10/05/17 1754 10/05/17 2035 10/06/17 0256  NA 115*  116* 133* 116*  K 5.5*  5.5* 3.2* 4.7  CL 85*  85* 102 83*  CO2 17* 11* 20*  GLUCOSE 286*  295* 216* 157*  BUN 29*  28* 21* 29*  CREATININE 1.20*  1.20* 0.77 1.27*  CALCIUM 8.1* 5.3* 8.2*  PROT 6.3* 4.3* 6.2*  ALBUMIN 2.9* 2.0* 2.9*  AST 22 15 25   ALT 19 13* 18  ALKPHOS 71 47 67  BILITOT 0.2* 0.3 0.4  GFRNONAA 42* >60 39*  GFRAA 48* >60 45*  ANIONGAP 13 20* 13     Hematology Recent Labs  Lab 10/05/17 1754 10/05/17 2035 10/06/17 0256  WBC 8.5 9.4 9.5  RBC 3.40* 3.47* 3.32*  HGB 9.9*  9.4* 9.6* 9.2*  HCT 29.0*  27.0* 27.6* 26.4*  MCV 79.4 79.5 79.5  MCH 27.6 27.7 27.7  MCHC 34.8 34.8 34.8  RDW 13.0 13.0 13.0  PLT 485* 493* 468*    Cardiac Enzymes Recent Labs  Lab 10/05/17 1754 10/05/17 2035 10/06/17 0256  TROPONINI 1.46* 1.20* 1.58*   No results for input(s): TROPIPOC in the last 168 hours.  BNP Recent Labs  Lab 10/05/17 2035  BNP 3,798.7*     DDimer No results for input(s): DDIMER in the last 168 hours.   Radiology    No results found.  Cardiac Studies   Coronary angiography and PCI: 10/05/2017 Coronary Diagrams   Diagnostic Diagram       Post-Intervention Diagram          Patient Profile     77 y.o. female with history of diabetes mellitus who presented with acute anterior myocardial infarction, starting 24-36 hours before presentation.  LAD stenting performed.  LAD flow was decreased postprocedure and there was evidence of microcirculatory obstruction.  TIMI blush score 0.  Assessment & Plan    1. Late presenting anterior myocardial infarction treated with angioplasty and stenting, with postprocedure slow flow and decreased myocardial blush.  Persistent chest discomfort with pleuritic component now suggestive of post infarct  pericarditis. 2. 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 -IV Decadron then add colchicine 3. Life-threatening hyponatremia -IV Lasix 4. Suspected acute systolic heart failure -IV Lasix 5. Diabetes mellitus type 2, with complications and poor control.  Overall, patient is critically ill.  Suspect post infarct pericarditis due to late presentation greater than 24 hours post MI, hyponatremia likely related to heart failure although will get either nephrology or critical care to help manage, will treat severe symptoms from pericarditis acutely with IV Decadron then colchicine, suspect significant reduction in LV function (stat echo).  Overall prognosis is guarded as she is now suffering significant complications due to late presentation with large infarct.  Critical care time, 40 minutes.  Time spent managing acute illness, discussion with patient and family via interpreter, and coordinating care.  For questions or updates, please contact [facility] Please consult [email or web address] for contact info under Cardiology/STEMI.      Signed, Lesleigh NoeHenry W Long Brimage III, MD  10/06/2017, 8:50 AM

## 2017-10-06 NOTE — H&P (Signed)
Referring Provider: No ref. provider found Primary Care Physician:  No primary care provider on file. Primary Nephrologist:     Reason for Consultation:   Hyponatremia    HPI:  This is a very nice lady [place]  She has been staying with family in the [place] since December  She was brought to the ER with chest pain and deemed to have late presenting anterior MI that was treated with angioplasty and stent  She developed post infarction pericarditis and had hyponatremia  Her sodium was found to 116 this morning that has dropped acutely from 133 yesterday  Creatinine ws 0.77 and calcium low at 5.3  Corrected still low and about 7.0 She appears very frail and weak and has no evidence of edema on examination  Her bicarbonate improved from 11 -- to 20  She diuresed aboutl 2.7 L yesterday and sodium still dropped       Past Medical History:  Diagnosis Date  . HOH (hard of hearing)   . Non-insulin dependent type 2 diabetes mellitus [facility])     Past Surgical History:  Procedure Laterality Date  . CORONARY/GRAFT ACUTE MI REVASCULARIZATION N/A 10/05/2017   Procedure: Coronary/Graft Acute MI Revascularization;  Surgeon: Lennette Bihari, MD;  Location[facility] INVASIVE CV LAB;  Service: Cardiovascular;  Laterality: N/A;  . LEFT HEART CATH AND CORONARY ANGIOGRAPHY N/A 10/05/2017   Procedure: LEFT HEART CATH AND CORONARY ANGIOGRAPHY;  Surgeon: Lennette Bihari, MD;  Location: [facility] INVASIVE CV LAB;  Service: Cardiovascular;  Laterality: N/A;    Prior to Admission medications   Medication Sig Start Date End Date Taking? Authorizing Provider  glimepiride (AMARYL) 2 MG tablet Take 2 mg by mouth daily with breakfast.   Yes [provider]  metFORMIN (GLUCOPHAGE) 1000 MG tablet Take 1,000 mg by mouth daily with breakfast.   Yes [provider]  nitrofurantoin (MACRODANTIN) 50 MG capsule Take 50 mg by mouth 4 (four) times daily.   Yes [provider]  predniSONE (DELTASONE) 10  MG tablet Take 10 mg by mouth daily with breakfast. 10/04/17 10/11/17 Yes [provider]    Current Facility-Administered Medications  Medication Dose Route Frequency Provider Last Rate Last Dose  . 0.9 %  sodium chloride infusion  250 mL Intravenous PRN Lennette Bihari, MD      . acetaminophen (TYLENOL) tablet 650 mg  650 mg Oral Q4H PRN Corine Shelter K, PA-C      . aspirin chewable tablet 81 mg  81 mg Oral Daily Lennette Bihari, MD   81 mg at 10/06/17 0928  . atorvastatin (LIPITOR) tablet 80 mg  80 mg Oral q1800 Lennette Bihari, MD      . colchicine tablet 0.6 mg  0.6 mg Oral BID Lyn Records, MD   0.6 mg at 10/06/17 1300  . diazepam (VALIUM) tablet 5 mg  5 mg Oral Q6H PRN Lennette Bihari, MD   5 mg at 10/06/17 0003  . insulin aspart (novoLOG) injection 0-5 Units  0-5 Units Subcutaneous QHS Abelino Derrick, [place]   4 Units at 10/05/17 2257  . insulin aspart (novoLOG) injection 0-9 Units  0-9 Units Subcutaneous TID WC Abelino Derrick, PA-C   3 Units at 10/06/17 1259  . metoprolol tartrate (LOPRESSOR) tablet 12.5 mg  12.5 mg Oral BID Abelino Derrick, PA-C   12.5 mg at 10/06/17 1610  . morphine 2 MG/ML injection 2 mg  2 mg Intravenous Q1H PRN Katrinka Blazing,  Barry DienesHenry W, MD   2 mg at 10/06/17 1454  . nitroGLYCERIN (NITROSTAT) SL tablet 0.4 mg  0.4 mg Sublingual Q5 Min x 3 PRN Kilroy, Luke K, PA-C      . nitroGLYCERIN 50 mg in dextrose 5 % 250 mL (0.2 mg/mL) infusion  0-200 mcg/min Intravenous Titrated Lennette BihariKelly, Thomas A, MD   Stopped at 10/06/17 1240  . ondansetron (ZOFRAN) injection 4 mg  4 mg Intravenous Q6H PRN Lennette BihariKelly, Thomas A, MD      . Melene Muller[START ON 10/07/2017] pneumococcal 23 valent vaccine (PNU-IMMUNE) injection 0.5 mL  0.5 mL Intramuscular Tomorrow-1000 Lyn RecordsSmith, Henry W, MD      . sodium chloride flush (NS) 0.9 % injection 3 mL  3 mL Intravenous PRN Kilroy, Luke K, PA-C      . sodium chloride flush (NS) 0.9 % injection 3 mL  3 mL Intravenous Q12H Lennette BihariKelly, Thomas A, MD   3 mL at 10/06/17 0932  . ticagrelor  (BRILINTA) tablet 90 mg  90 mg Oral BID Lennette BihariKelly, Thomas A, MD   90 mg at 10/06/17 0556  . zolpidem (AMBIEN) tablet 5 mg  5 mg Oral QHS PRN Lennette BihariKelly, Thomas A, MD        Allergies as of 10/05/2017  . (No Known Allergies)    History reviewed. No pertinent family history.  Social History   Socioeconomic History  . Marital status: Widowed    Spouse name: Not on file  . Number of children: Not on file  . Years of education: Not on file  . Highest education level: Not on file  Occupational History  . Not on file  Social Needs  . Financial resource strain: Not on file  . Food insecurity:    Worry: Not on file    Inability: Not on file  . Transportation needs:    Medical: Not on file    Non-medical: Not on file  Tobacco Use  . Smoking status: Never Smoker  . Smokeless tobacco: Never Used  Substance and Sexual Activity  . Alcohol use: Not Currently  . Drug use: Never  . Sexual activity: Not Currently  Lifestyle  . Physical activity:    Days per week: Not on file    Minutes per session: Not on file  . Stress: Not on file  Relationships  . Social connections:    Talks on phone: Not on file    Gets together: Not on file    Attends religious service: Not on file    Active member of club or organization: Not on file    Attends meetings of clubs or organizations: Not on file    Relationship status: Not on file  . Intimate partner violence:    Fear of current or ex partner: Not on file    Emotionally abused: Not on file    Physically abused: Not on file    Forced sexual activity: Not on file  Other Topics Concern  . Not on file  Social History Narrative  . Not on file    Review of Systems: Gen: Denies any fever, chills, sweats, anorexia, fatigue, weakness, malaise, weight loss, and sleep disorder HEENT: No visual complaints, No history of Retinopathy. Normal external appearance No Epistaxis or Sore throat. No sinusitis.   CV: Denies chest pain, angina, palpitations, syncope,  orthopnea, PND, peripheral edema, and claudication. Resp: Denies dyspnea at rest, dyspnea with exercise, cough, sputum, wheezing, coughing up blood, and pleurisy. GI: Denies vomiting blood, jaundice, and fecal incontinence.   Denies  dysphagia or odynophagia. GU : Denies urinary burning, blood in urine, urinary frequency, urinary hesitancy, nocturnal urination, and urinary incontinence.  No renal calculi. MS: Denies joint pain, limitation of movement, and swelling, stiffness, low back pain, extremity pain. Denies muscle weakness, cramps, atrophy.  No use of non steroidal antiinflammatory drugs. Derm: Denies rash, itching, dry skin, hives, moles, warts, or unhealing ulcers.  Psych: Denies depression, anxiety, memory loss, suicidal ideation, hallucinations, paranoia, and confusion. Heme: Denies bruising, bleeding, and enlarged lymph nodes. Neuro: No headache.  No diplopia. No dysarthria.  No dysphasia.  No history of CVA.  No Seizures. No paresthesias.  No weakness. Endocrine No DM.  No Thyroid disease.  No Adrenal disease.  Physical Exam: Vital signs in last 24 hours: Temp:  [97.5 F (36.4 C)-97.6 F (36.4 C)] 97.6 F (36.4 C) (04/11 0804) Pulse Rate:  [18-106] 75 (04/11 1325) Resp:  [8-31] 20 (04/11 1329) BP: (77-171)/(26-111) 119/88 (04/11 1325) SpO2:  [92 %-100 %] 99 % (04/11 1325) Arterial Line BP: (110-182)/(51-113) 118/57 (04/11 0115) Weight:  [123 lb 7.3 oz (56 kg)-129 lb 10.1 oz (58.8 kg)] 123 lb 7.3 oz (56 kg) (04/11 1329) Last BM Date: (uta) General:   Ill appearing lady in no distress  Head:  Normocephalic and atraumatic. Eyes:  Sclera clear, no icterus.   Conjunctiva pink. Ears:  Normal auditory acuity. Nose:  No deformity, discharge,  or lesions. Mouth:  No deformity or lesions, dentition normal. Neck:  Supple; no masses or thyromegaly. JVP not elevated Lungs:  Clear throughout to auscultation.   No wheezes, crackles, or rhonchi. No acute distress. Heart:  Regular rate and  rhythm; no murmurs, clicks, rubs,  or gallops. Abdomen:  Soft, nontender and nondistended. No masses, hepatosplenomegaly or hernias noted. Normal bowel sounds, without guarding, and without rebound.   Msk:  Symmetrical without gross deformities. Normal posture. Pulses:  No carotid, renal, femoral bruits. DP and PT symmetrical and equal Extremities:  Without clubbing or edema. Neurologic:  Alert and  oriented x4;  grossly normal neurologically. Skin:  Intact without significant lesions or rashes.   Intake/Output from previous day: 04/10 0701 - 04/11 0700 In: 428.6 [I.V.:428.6] Out: 2250 [Urine:2250] Intake/Output this shift: Total I/O In: 178.9 [I.V.:178.9] Out: 450 [Urine:450]  Lab Results: Recent Labs    10/05/17 1754 10/05/17 2035 10/06/17 0256  WBC 8.5 9.4 9.5  HGB 9.9*  9.4* 9.6* 9.2*  HCT 29.0*  27.0* 27.6* 26.4*  PLT 485* 493* 468*   BMET Recent Labs    10/05/17 1754 10/05/17 2035 10/06/17 0256  NA 115*  116* 133* 116*  K 5.5*  5.5* 3.2* 4.7  CL 85*  85* 102 83*  CO2 17* 11* 20*  GLUCOSE 286*  295* 216* 157*  BUN 29*  28* 21* 29*  CREATININE 1.20*  1.20* 0.77 1.27*  CALCIUM 8.1* 5.3* 8.2*   LFT Recent Labs    10/06/17 0256  PROT 6.2*  ALBUMIN 2.9*  AST 25  ALT 18  ALKPHOS 67  BILITOT 0.4  BILIDIR <0.1*  IBILI NOT CALCULATED   PT/INR Recent Labs    10/05/17 1754  LABPROT 13.3  INR 1.02   Hepatitis Panel No results for input(s): HEPBSAG, HCVAB, HEPAIGM, HEPBIGM in the last 72 hours.  Studies/Results: Dg Chest Port 1 View  Result Date: 10/06/2017 CLINICAL DATA:  MI. EXAM: PORTABLE CHEST 1 VIEW COMPARISON:  None. FINDINGS: Cardiomegaly. Perihilar and lower lobe airspace opacities, likely edema. Layering bilateral effusions. IMPRESSION: Cardiomegaly with mild pulmonary edema and  layering effusions. Electronically Signed   By: Charlett Nose M.D.   On: 10/06/2017 10:37    Assessment/Plan:  Hyponatremia  Serum Na 116   Urine Sodium 59   Urine Osm 312  TSH 3.37     This looks like SIADH or could be secondary to congestive heart failure   I think this should improve with water restriction and diuretics. Certainly the drop is fairly sudden although the results yesterday are a little inconsistent . It would be unwise to correct this too rapidly to avoid central pontinue demyelination.     LOS: 1 Javier Gell W @TODAY @3 :44 PM

## 2017-10-06 NOTE — Progress Notes (Signed)
EKG CRITICAL VALUE     12 lead EKG performed.  Critical value noted. Tawnya CrookGretchen Smitherman, RN notified.   Kaelyn Innocent H, CCT 10/06/2017 7:29 AM

## 2017-10-06 NOTE — Progress Notes (Signed)
While sheath in place dressing changed frequently due to saturation.  First ACT was 198 second ACT was 147.  Sheath pulled by me and assistance with Darien RamusAna, Charity fundraiserN.  Catheter intact. Pressure held for 35 minutes hemastasis achieved. Site dressed with gauze and transparent film. Level 0.  No issues throughout.

## 2017-10-06 NOTE — Progress Notes (Signed)
  Echocardiogram 2D Echocardiogram has been performed.  Sheila White  [name] 10/06/2017, 9:58 AM

## 2017-10-06 NOTE — Progress Notes (Signed)
Pt with 2nd episode of SOB, anxiety, c/o pain horizontal across upper stomach under breast line.

## 2017-10-06 NOTE — Progress Notes (Signed)
   Clarification from family is that patient has tended to ramble and be forgetful/confused over the past 2-3 months.  Current situation is not new.  Decadron has helped chest pain although having difficulty being accurate with that assessment based on language barrier.  Chest x-ray reveals pulmonary edema.  Echo demonstrates anteroapical infarction with thinning of myocardium suggesting an old infarction than 24-48 hours.  Cardiac marker data suggests that infarct was greater than 48 hours ago.  Plan: Colchicine for pericarditis, IV Lasix to treat heart failure, ACE/ARB therapy when kidney function stable, beta-blocker therapy when volume status is improved, nephrology consult concerning hyponatremia.  Hyponatremia likely secondary to heart failure and hopefully will improve with diuresis.  I have only given 1 dose of 40 mg IV Lasix.  Guarded prognosis.

## 2017-10-06 NOTE — Progress Notes (Signed)
Pt 3rd episode of SOB, anxiety, and attempting to get OOB. Pt Nitro titrated up MD notified, pt tol well, [facility]

## 2017-10-06 NOTE — Progress Notes (Signed)
CSW received consult for medication assistance- informed RNCM who assists with medication assistance  Burna SisJenna H. Kyiah Canepa, LCSW Clinical Social Worker [phone]

## 2017-10-06 NOTE — Progress Notes (Signed)
Pt having episode of SOB, anxiety, and pointing to stomach, pt spanish speaking only, Spanish speaking RN translating, MD paged [facility] received

## 2017-10-07 ENCOUNTER — Encounter (HOSPITAL_COMMUNITY): Payer: Self-pay | Admitting: *Deleted

## 2017-10-07 LAB — BASIC METABOLIC PANEL
Anion gap: 13 (ref 5–15)
BUN: 39 mg/dL — AB (ref 6–20)
CALCIUM: 8.1 mg/dL — AB (ref 8.9–10.3)
CHLORIDE: 83 mmol/L — AB (ref 101–111)
CO2: 22 mmol/L (ref 22–32)
Creatinine, Ser: 2.57 mg/dL — ABNORMAL HIGH (ref 0.44–1.00)
GFR calc Af Amer: 19 mL/min — ABNORMAL LOW (ref >60)
GFR calc non Af Amer: 17 mL/min — ABNORMAL LOW (ref >60)
GLUCOSE: 111 mg/dL — AB (ref 65–99)
Potassium: 4.5 mmol/L (ref 3.5–5.1)
Sodium: 118 mmol/L — CL (ref 135–145)

## 2017-10-07 LAB — GLUCOSE, CAPILLARY
GLUCOSE-CAPILLARY: 102 mg/dL — AB (ref 65–99)
GLUCOSE-CAPILLARY: 231 mg/dL — AB (ref 65–99)
Glucose-Capillary: 203 mg/dL — ABNORMAL HIGH (ref 65–99)
Glucose-Capillary: 149 mg/dL — ABNORMAL HIGH (ref 65–99)
Glucose-Capillary: 147 mg/dL — ABNORMAL HIGH (ref 65–99)

## 2017-10-07 LAB — BASIC METABOLIC PANEL WITH GFR
Anion gap: 16 — ABNORMAL HIGH (ref 5–15)
BUN: 32 mg/dL — ABNORMAL HIGH (ref 6–20)
CO2: 17 mmol/L — ABNORMAL LOW (ref 22–32)
Calcium: 8.3 mg/dL — ABNORMAL LOW (ref 8.9–10.3)
Chloride: 83 mmol/L — ABNORMAL LOW (ref 101–111)
Creatinine, Ser: 1.85 mg/dL — ABNORMAL HIGH (ref 0.44–1.00)
GFR calc Af Amer: 29 mL/min — ABNORMAL LOW
GFR calc non Af Amer: 25 mL/min — ABNORMAL LOW
Glucose, Bld: 186 mg/dL — ABNORMAL HIGH (ref 65–99)
Potassium: 4.8 mmol/L (ref 3.5–5.1)
Sodium: 116 mmol/L — CL (ref 135–145)

## 2017-10-07 LAB — SODIUM
SODIUM: 118 mmol/L — AB (ref 135–145)
SODIUM: 114 mmol/L — AB (ref 135–145)

## 2017-10-07 MED ORDER — TOLVAPTAN 15 MG PO TABS
15.0000 mg | ORAL_TABLET | ORAL | Status: DC
  Administered 2017-10-07: 15 mg via ORAL
  Filled 2017-10-07 (×2): qty 1

## 2017-10-07 MED ORDER — PNEUMOCOCCAL VAC POLYVALENT 25 MCG/0.5ML IJ INJ: 0.5000 mL | INJECTION | INTRAMUSCULAR | Status: DC | PRN

## 2017-10-07 MED ORDER — GI COCKTAIL ~~LOC~~
30.0000 mL | Freq: Once | ORAL | Status: AC
  Administered 2017-10-07: 30 mL via ORAL
  Filled 2017-10-07: qty 30

## 2017-10-07 NOTE — Progress Notes (Signed)
Patient ID: Sheila White, female   DOB: 07/16/1940, 77 y.o.   MRN: [id number] [facility] Progress Note   Assessment/ Plan:   1.  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  Concern raised with acute elevation and then again drop of sodium level however, sodium of 133 indeed appears to be spurious.  Awaiting labs from this morning to decide on further management-may need to add Tolvaptan for at least 1 or 2 doses to help augment aquaresis. 2.  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 3.  Acute systolic heart failure: LVEF 30%, continue efforts at volume unloading with furosemide.  Subjective:   Reports some chest discomfort overnight and episodes of anxiety with shortness of breath.   Objective:   BP 132/73   Pulse 86   Temp 98 F (36.7 C) (Oral)   Resp 14   Ht 5' (1.524 m)   Wt 55.8 kg (123 lb 0.3 oz)   SpO2 96%   BMI 24.03 kg/m   Intake/Output Summary (Last 24 hours) at 10/07/2017 0845 Last data filed at 10/07/2017 0600 Gross per 24 hour  Intake 149.1 ml  Output 1045 ml  Net -895.9 ml   Weight change: -2.8 kg (-6 lb 2.8 oz)  Physical Exam: Gen: Appears to be comfortable sitting up in bed, family members at bedside CVS: Pulse regular rhythm, normal rate, S1 and S2 normal without any rub Resp: Fine rales right base otherwise clear to auscultation, no rhonchi Abd: Soft, flat, nontender Ext: No lower extremity edema  Imaging: Dg Chest Port 1 View  Result Date: 10/06/2017 CLINICAL DATA:  MI. EXAM: PORTABLE CHEST 1 VIEW COMPARISON:  None. FINDINGS: Cardiomegaly. Perihilar and lower lobe airspace opacities, likely edema. Layering bilateral effusions.  IMPRESSION: Cardiomegaly with mild pulmonary edema and layering effusions. Electronically Signed   By: Charlett NoseKevin  Dover M.D.   On: 10/06/2017 10:37    Labs: BMET Recent Labs  Lab 10/05/17 1754 10/05/17 2035 10/06/17 0256  NA 115*  116* 133* 116*  K 5.5*  5.5* 3.2* 4.7  CL 85*  85* 102 83*  CO2 17* 11* 20*  GLUCOSE 286*  295* 216* 157*  BUN 29*  28* 21* 29*  CREATININE 1.20*  1.20* 0.77 1.27*  CALCIUM 8.1* 5.3* 8.2*   CBC Recent Labs  Lab 10/05/17 1754 10/05/17 2035 10/06/17 0256  WBC 8.5 9.4 9.5  NEUTROABS  --  7.4  --   HGB 9.9*  9.4* 9.6* 9.2*  HCT 29.0*  27.0* 27.6* 26.4*  MCV 79.4 79.5 79.5  PLT 485* 493* 468*    Medications:    . aspirin  81 mg Oral Daily  . atorvastatin  80 mg Oral q1800  . colchicine  0.6 mg Oral BID  . furosemide  40 mg Intravenous BID  . insulin aspart  0-5 Units Subcutaneous QHS  . insulin aspart  0-9 Units Subcutaneous TID WC  . metoprolol tartrate  12.5 mg Oral BID  . sodium chloride flush  3 mL Intravenous Q12H  . ticagrelor  90 mg Oral BID   Zetta BillsJay Dafney Farler, MD 10/07/2017, 8:45 AM

## 2017-10-07 NOTE — Care Management Note (Signed)
Case Management Note  Patient Details  Name: Sheila White MRN: [id number] Date of Birth: 07/16/1940  Subjective/Objective:       Pt admitted with MI - post infarct pericard            Action/Plan:  PTA independent from home with daughter - [place] recently moved to [place] and [place] not have insurance.  [facility]  CM spoke with pt/daughter and son at bedside (daughter interpreted).  CM explained the PCP appt and that pt will also need to go to clinic on 4/18 and apply for orange card - all information provided on AVS.  Pt will need to be MATCH at discharge.  CM discussed potential concern with NP Lillia AbedLindsay that pt may have gap in medication coverage post MATCH inventory and medication assistance being provided by [employer] - NP will follow pt closer to discharge for contingency plan for medications    Expected Discharge Date:                  Expected Discharge Plan:  Home/Self Care  In-House Referral:     Discharge planning Services  CM Consult  Post Acute Care Choice:    Choice offered to:     DME Arranged:    DME Agency:     HH Arranged:    HH Agency:     Status of Service:     If discussed at [employer] Meetings, dates discussed:    Additional Comments:  Sheila White, Clarinda Obi S, RN 10/07/2017, 10:14 AM

## 2017-10-07 NOTE — Progress Notes (Signed)
Pt's sodium level at 1824 was 118 and went down to 114 at 2207. Pt also had a total urine output of 5 ml for the last 5 hours. Dr. Allena KatzPatel from Nephrology was paged and notified of critical values and low urine output. Dr. Allena KatzPatel ordered to remove the 800 ml fluid restriction on the patient and encourage fluid intake. Will continue to monitor patient closely.

## 2017-10-07 NOTE — Progress Notes (Signed)
Attempted to notify cardiology about patient's decreased urine output over the past few hours.

## 2017-10-07 NOTE — Progress Notes (Signed)
Progress Note  Patient Name: Sheila White Date of Encounter: 10/07/2017  Primary Cardiologist: Nicki Guadalajarahomas Kelly  Subjective   Seems to feel better.  Family at bedside.  Denies angina.  Pericarditic pain has resolved.  Denies dyspnea.  Language barrier prevents direct evaluation.  Inpatient Medications    Scheduled Meds: . aspirin  81 mg Oral Daily  . atorvastatin  80 mg Oral q1800  . colchicine  0.6 mg Oral BID  . furosemide  40 mg Intravenous BID  . insulin aspart  0-5 Units Subcutaneous QHS  . insulin aspart  0-9 Units Subcutaneous TID WC  . metoprolol tartrate  12.5 mg Oral BID  . sodium chloride flush  3 mL Intravenous Q12H  . ticagrelor  90 mg Oral BID   Continuous Infusions: . sodium chloride    . nitroGLYCERIN Stopped (10/06/17 1240)   PRN Meds: sodium chloride, acetaminophen, diazepam, morphine injection, nitroGLYCERIN, ondansetron (ZOFRAN) IV, pneumococcal 23 valent vaccine, sodium chloride flush, zolpidem   Vital Signs    Vitals:   10/07/17 0500 10/07/17 0600 10/07/17 0745 10/07/17 1100  BP: 116/66 132/73  126/90  Pulse: 80 86  85  Resp: 12 14    Temp:   98 F (36.7 C)   TempSrc:   Oral   SpO2: 99% 96%    Weight:      Height:        Intake/Output Summary (Last 24 hours) at 10/07/2017 1202 Last data filed at 10/07/2017 0600 Gross per 24 hour  Intake 34.3 ml  Output 1045 ml  Net -1010.7 ml   Filed Weights   10/06/17 0500 10/06/17 1329 10/07/17 0349  Weight: 123 lb 7.3 oz (56 kg) 123 lb 7.3 oz (56 kg) 123 lb 0.3 oz (55.8 kg)    Telemetry    Normal sinus rhythm- Personally Reviewed  ECG    Sinus rhythm, prominent precordial voltage, ST elevation of 2 mm in leads with Q waves but also generally mild elevation in other leads.  No evolutionary change since admission.  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  There is probably also superimposed epicardial injury from pericarditis.- Personally Reviewed  Physical  Exam  Feels better.  Not able to carry on conversation because of language barrier.  Family at bedside. GEN: No acute distress.   Neck:  Decrease in neck veins compared to prior.  Neck veins are still visible with the patient lying at 30 degrees just slightly above the clavicle. Cardiac: RRR, no murmurs or rub.  S4 gallop.  Respiratory: Clear to auscultation bilaterally. GI: Soft, nontender, non-distended  MS: No edema; No deformity. Neuro:  Nonfocal  Psych: Normal affect   Labs    Chemistry Recent Labs  Lab 10/05/17 1754 10/05/17 2035 10/06/17 0256 10/07/17 0545  NA 115*  116* 133* 116* 116*  K 5.5*  5.5* 3.2* 4.7 4.8  CL 85*  85* 102 83* 83*  CO2 17* 11* 20* 17*  GLUCOSE 286*  295* 216* 157* 186*  BUN 29*  28* 21* 29* 32*  CREATININE 1.20*  1.20* 0.77 1.27* 1.85*  CALCIUM 8.1* 5.3* 8.2* 8.3*  PROT 6.3* 4.3* 6.2*  --   ALBUMIN 2.9* 2.0* 2.9*  --   AST 22 15 25   --   ALT 19 13* 18  --   ALKPHOS 71 47 67  --   BILITOT 0.2* 0.3 0.4  --   GFRNONAA 42* >60 39* 25*  GFRAA 48* >60 45* 29*  ANIONGAP 13 20*  13 16*     Hematology Recent Labs  Lab 10/05/17 1754 10/05/17 2035 10/06/17 0256  WBC 8.5 9.4 9.5  RBC 3.40* 3.47* 3.32*  HGB 9.9*  9.4* 9.6* 9.2*  HCT 29.0*  27.0* 27.6* 26.4*  MCV 79.4 79.5 79.5  MCH 27.6 27.7 27.7  MCHC 34.8 34.8 34.8  RDW 13.0 13.0 13.0  PLT 485* 493* 468*    Cardiac Enzymes Recent Labs  Lab 10/05/17 1754 10/05/17 2035 10/06/17 0256  TROPONINI 1.46* 1.20* 1.58*   No results for input(s): TROPIPOC in the last 168 hours.   BNP Recent Labs  Lab 10/05/17 2035 10/06/17 1037  BNP 3,798.7* 2,455.9*     DDimer No results for input(s): DDIMER in the last 168 hours.   Radiology    Dg Chest Port 1 View  Result Date: 10/06/2017 CLINICAL DATA:  MI. EXAM: PORTABLE CHEST 1 VIEW COMPARISON:  None. FINDINGS: Cardiomegaly. Perihilar and lower lobe airspace opacities, likely edema. Layering bilateral effusions. IMPRESSION:  Cardiomegaly with mild pulmonary edema and layering effusions. Electronically Signed   By: Charlett Nose M.D.   On: 10/06/2017 10:37    Cardiac Studies   Study Conclusions   - Left ventricle: The cavity size was normal. There was mild focal   basal hypertrophy of the septum. Mid to apical   inferoseptal/anteroseptal akinesis, apical lateral akinesis, mid   to apical anterior akinesis, apical inferior akinesis, akinesis   of the true apex. The estimated ejection fraction was 30%.   Features are consistent with a pseudonormal left ventricular   filling pattern, with concomitant abnormal relaxation and   increased filling pressure (grade 2 diastolic dysfunction). - Aortic valve: Trileaflet; moderately calcified leaflets.   Sclerosis without stenosis. There was trivial regurgitation. - Mitral valve: Mildly calcified annulus. There was trivial   regurgitation. - Right ventricle: The cavity size was normal. Systolic function   was normal. - Pulmonary arteries: No complete TR doppler jet so unable to   estimate PA systolic pressure. - Inferior vena cava: The vessel was normal in size. The   respirophasic diameter changes were in the normal range (= 50%),   consistent with normal central venous pressure. - Pericardium, extracardiac: A trivial pericardial effusion was   identified.   Impressions:   - Normal LV size with mild focal basal septal hypertrophy. EF 30%   with wall motion abnormalities as noted above in LAD   distribution. Normal RV size and systolic function. Aortic valve   sclerosis without significant stenosis.   Patient Profile     77 y.o. female with history of diabetes mellitus who presented with acute anterior myocardial infarction, starting 24-36 hours before presentation. LAD stenting performed. LAD flow was decreased postprocedure and there was evidence of microcirculatory obstruction. TIMI blush score 0.  Severe hyponatremia of uncertain duration, not responding to  diuresis for heart failure management.   Assessment & Plan    1. Acute on chronic systolic heart failure developing over the past 7-10 days prior to admission due to a large late presenting anterior myocardial infarction.  Pulmonary edema on chest x-ray has been managed with diuresis.  Net -2.6 L since admission. 2. Coronary artery disease with late presenting anterior myocardial infarction due to LAD occlusion, treated with PCI and stenting of the LAD in multiple sites. 3. Chest pain without pericardial rub but highly suspicious for post infarct pericarditis responding to 1 dose of IV Decadron with complete relief.  Now on colchicine 0.6 mg twice daily but may need  to adjust if kidney injury continues. 4. Severe hyponatremia, not improving with diuresis for acute heart failure.  Suspect SIADH.  Chronicity of hyponatremia is uncertain.  Consider tolvaptan therapy.  Will discuss with nephrology. 5. Acute kidney injury, stage III secondary to diuresis and decreased cardiac output in the setting of diabetes mellitus.  Plan to discontinue diuretic therapy at this point.  Also plan gentle hydration for several hours with normal saline. 6. Pre-existing cognitive impairment, uncertain if organic versus hyponatremia (not ODS since hyponatremia is still uncorrected) related.  Guarded prognosis considering above significant medical issues.  For questions or updates, please contact [facility] Please consult [email or web address] for contact info under Cardiology/STEMI.      Signed, Lesleigh Noe, MD  10/07/2017, 12:02 PM

## 2017-10-07 NOTE — Progress Notes (Signed)
EKG CRITICAL VALUE     12 lead EKG performed.  Critical value noted. Laverna PeaceShanna Hintz, RN notified.   Deitra MayoWATLINGTON, Jadie Allington H, CCT 10/07/2017 7:32 AM

## 2017-10-07 NOTE — Progress Notes (Signed)
   10/07/17 1100  Clinical Encounter Type  Visited With Patient;Patient and family together  Visit Type Spiritual support  Referral From Nurse  Consult/Referral To Chaplain  Spiritual Encounters  Spiritual Needs Prayer  Stress Factors  Patient Stress Factors None identified  Family Stress Factors None identified    Responded to Henry She is a nice lady. I prayed for her.  Also, her family  supports her.   Chaplain Intern Breck CoonsIntek Tiandre Teall

## 2017-10-08 ENCOUNTER — Inpatient Hospital Stay: Payer: Self-pay

## 2017-10-08 DIAGNOSIS — I5023 Acute on chronic systolic (congestive) heart failure: Secondary | ICD-10-CM

## 2017-10-08 DIAGNOSIS — N289 Disorder of kidney and ureter, unspecified: Secondary | ICD-10-CM

## 2017-10-08 LAB — OSMOLALITY: Osmolality: 261 mOsm/kg — ABNORMAL LOW (ref 275–295)

## 2017-10-08 LAB — GLUCOSE, CAPILLARY
GLUCOSE-CAPILLARY: 133 mg/dL — AB (ref 65–99)
GLUCOSE-CAPILLARY: 144 mg/dL — AB (ref 65–99)
GLUCOSE-CAPILLARY: 150 mg/dL — AB (ref 65–99)
Glucose-Capillary: 176 mg/dL — ABNORMAL HIGH (ref 65–99)
Glucose-Capillary: 119 mg/dL — ABNORMAL HIGH (ref 65–99)
Glucose-Capillary: 122 mg/dL — ABNORMAL HIGH (ref 65–99)

## 2017-10-08 LAB — SODIUM
SODIUM: 114 mmol/L — AB (ref 135–145)
SODIUM: 115 mmol/L — AB (ref 135–145)

## 2017-10-08 LAB — BASIC METABOLIC PANEL
Anion gap: 13 (ref 5–15)
Anion gap: 12 (ref 5–15)
BUN: 44 mg/dL — AB (ref 6–20)
BUN: 47 mg/dL — AB (ref 6–20)
CALCIUM: 7.6 mg/dL — AB (ref 8.9–10.3)
CHLORIDE: 82 mmol/L — AB (ref 101–111)
CHLORIDE: 81 mmol/L — AB (ref 101–111)
CO2: 19 mmol/L — ABNORMAL LOW (ref 22–32)
CO2: 20 mmol/L — AB (ref 22–32)
CREATININE: 3.2 mg/dL — AB (ref 0.44–1.00)
Calcium: 7.5 mg/dL — ABNORMAL LOW (ref 8.9–10.3)
Creatinine, Ser: 3.62 mg/dL — ABNORMAL HIGH (ref 0.44–1.00)
GFR calc Af Amer: 15 mL/min — ABNORMAL LOW (ref >60)
GFR calc Af Amer: 13 mL/min — ABNORMAL LOW (ref >60)
GFR calc non Af Amer: 11 mL/min — ABNORMAL LOW (ref >60)
GFR, EST NON AFRICAN AMERICAN: 13 mL/min — AB (ref >60)
Glucose, Bld: 127 mg/dL — ABNORMAL HIGH (ref 65–99)
Glucose, Bld: 146 mg/dL — ABNORMAL HIGH (ref 65–99)
POTASSIUM: 4.6 mmol/L (ref 3.5–5.1)
Potassium: 4.8 mmol/L (ref 3.5–5.1)
SODIUM: 114 mmol/L — AB (ref 135–145)
Sodium: 113 mmol/L — CL (ref 135–145)

## 2017-10-08 LAB — OSMOLALITY, URINE: Osmolality, Ur: 184 mOsm/kg — ABNORMAL LOW (ref 300–900)

## 2017-10-08 LAB — SODIUM, URINE, RANDOM

## 2017-10-08 MED ORDER — SODIUM CHLORIDE 3 % IV SOLN
INTRAVENOUS | Status: AC
  Administered 2017-10-08: 33 mL/h via INTRAVENOUS
  Filled 2017-10-08 (×2): qty 500

## 2017-10-08 MED ORDER — COLCHICINE 0.6 MG PO TABS
0.6000 mg | ORAL_TABLET | Freq: Every day | ORAL | Status: DC
  Administered 2017-10-08 – 2017-10-24 (×17): 0.6 mg via ORAL
  Filled 2017-10-08 (×17): qty 1

## 2017-10-08 MED ORDER — SODIUM CHLORIDE 3 % IV SOLN
INTRAVENOUS | Status: DC
  Filled 2017-10-08 (×2): qty 500

## 2017-10-08 MED ORDER — SODIUM CHLORIDE 3 % IV SOLN: INTRAVENOUS | Status: DC

## 2017-10-08 MED ORDER — SODIUM CHLORIDE 0.9 % IV SOLN
INTRAVENOUS | Status: DC
  Administered 2017-10-08: 09:00:00 via INTRAVENOUS

## 2017-10-08 MED ORDER — GI COCKTAIL ~~LOC~~
30.0000 mL | Freq: Once | ORAL | Status: AC
  Administered 2017-10-08: 30 mL via ORAL
  Filled 2017-10-08: qty 30

## 2017-10-08 NOTE — Progress Notes (Signed)
CRITICAL VALUE ALERT  Critical Value: Na 114  Date & Time Notied: 10/08/17 @ [id number]  Provider Notified: Dr. Allena KatzPatel (nephrology) informed on rounds @ [phone]  Orders Received/Actions taken: NS bolus ordered

## 2017-10-08 NOTE — Progress Notes (Signed)
Cardiac Rehab Note: 1315-1330 Cardiac Rehab RN in to see patient and family. Patient just got up to chair with primary RN. Patient moaning with eyes closed. Having abdominal pain. Family at bedside. RN aware. Patient got valium last evening and still remains mildly sedated. Daughter english speaking however request all education information to be given in spanish. Spanish version of Heart Attack booklet given. Unable to review related to family being very attentive to mothers pain and needs. Daughter states she will be present on Monday for education. Answered questions about progression of activity. Patient not appropriate for ambulation today. Will follow up

## 2017-10-08 NOTE — Progress Notes (Signed)
CRITICAL VALUE ALERT  Critical Value:  Sodium  114  Date & Time Notied:  10/08/17 1940   Provider Notified: Dr Arlean HoppingSchertz  Orders Received/Actions taken: MD aware of low sodium level; will start hypertonic Sodium and recheck level q 3hr. Na-level expected to go up not greater than 4meq every 2 hr. Order to call sodium level if increases rapidly but not every level higher that 114 while fluids infusing even if still critical value. Will monitor pt closely.

## 2017-10-08 NOTE — Progress Notes (Signed)
Spoke with Dr Arlean HoppingSchertz re PICC line, states ok to place. Spoke with night shift RN to notify PICC to be placed in AM.  She states a line is needed tonight for 3% NS and will call CCM.  Notified okay to run via PIV if less than 24 hours.  States will still call CCM per MD request.

## 2017-10-08 NOTE — Progress Notes (Signed)
CRITICAL VALUE ALERT  Critical Value: Na 113  Date & Time Notified: 10/08/17 @ 6:04pm  Provider Notified: Schertz @ 6:13pm  Orders Received/Actions taken: Schertz @ 1816, orders PICC placement for hypertonic saline

## 2017-10-08 NOTE — Plan of Care (Signed)
At around 1420pm, pt sitting in chair and c/o severe epigastric pain, described as cramping that radiates to upward to her sternum. Pink/blood-tinged urine also noted in pt's foley catheter tubing at this time. Pt given Morphine 2mg  IV for pain and one sublingual NTG tablet at this time. Both Dr. Arlean HoppingSchertz (nephrology) and Dr. Johney FrameAllred (cardiology) paged. Schertz made aware of new blood in urine and abdominal pain. Orders for Na level draw (BMET) at 5pm, so Schertz requested RN to call once those labs have resulted, if Na has not improved. Allred made aware of new epigastric pain, blood in urine. Allred ordered GI cocktail. GI cocktail given to patient at 1451pm, which she almost immediately vomited back up. Zofran 4mg  IV then given to patient for nausea and vomiting, and a cool cloth was placed on her neck. Will obtain 12-Lead EKG since pain radiates to chest and pt is only 24-hours post MI.

## 2017-10-08 NOTE — Progress Notes (Addendum)
Progress Note   Subjective   Doing well today, the patient denies CP or SOB.  No new concerns  Inpatient Medications    Scheduled Meds: . aspirin  81 mg Oral Daily  . atorvastatin  80 mg Oral q1800  . colchicine  0.6 mg Oral Daily  . insulin aspart  0-5 Units Subcutaneous QHS  . insulin aspart  0-9 Units Subcutaneous TID WC  . metoprolol tartrate  12.5 mg Oral BID  . sodium chloride flush  3 mL Intravenous Q12H  . ticagrelor  90 mg Oral BID   Continuous Infusions: . sodium chloride    . sodium chloride 50 mL/hr at 10/08/17 1100   PRN Meds: sodium chloride, acetaminophen, diazepam, morphine injection, nitroGLYCERIN, ondansetron (ZOFRAN) IV, pneumococcal 23 valent vaccine, sodium chloride flush, zolpidem   Vital Signs    Vitals:   10/08/17 0400 10/08/17 0420 10/08/17 0500 10/08/17 0600  BP: (!) 82/61  97/70 93/61  Pulse: 66  69 67  Resp: 11  15 12   Temp:  (!) 97.5 F (36.4 C)    TempSrc:  Axillary    SpO2: 96%  100% 95%  Weight:   124 lb 1.9 oz (56.3 kg)   Height:        Intake/Output Summary (Last 24 hours) at 10/08/2017 1207 Last data filed at 10/08/2017 1100 Gross per 24 hour  Intake 665 ml  Output 145 ml  Net 520 ml   Filed Weights   10/06/17 1329 10/07/17 0349 10/08/17 0500  Weight: 123 lb 7.3 oz (56 kg) 123 lb 0.3 oz (55.8 kg) 124 lb 1.9 oz (56.3 kg)    Telemetry    Sinus rhythm - Personally Reviewed  Physical Exam   GEN- The patient is well appearing, sleeping but rouses Head- normocephalic, atraumatic Eyes-  Sclera clear, conjunctiva pink Ears- hearing intact Oropharynx- clear Neck- supple, Lungs- Clear to ausculation bilaterally, normal work of breathing Heart- Regular rate and rhythm  GI- soft, NT, ND, + BS Extremities- no clubbing, cyanosis, or edema  MS- no significant deformity or atrophy Skin- no rash or lesion Psych- euthymic mood, full affect Neuro- strength and sensation are intact   Labs    Chemistry Recent Labs  Lab  10/05/17 1754 10/05/17 2035 10/06/17 0256 10/07/17 0545  10/07/17 1824 10/07/17 2207 10/08/17 0702  NA 115*  116* 133* 116* 116*   < > 118* 114* 114*  K 5.5*  5.5* 3.2* 4.7 4.8  --  4.5  --  4.8  CL 85*  85* 102 83* 83*  --  83*  --  82*  CO2 17* 11* 20* 17*  --  22  --  19*  GLUCOSE 286*  295* 216* 157* 186*  --  111*  --  127*  BUN 29*  28* 21* 29* 32*  --  39*  --  44*  CREATININE 1.20*  1.20* 0.77 1.27* 1.85*  --  2.57*  --  3.20*  CALCIUM 8.1* 5.3* 8.2* 8.3*  --  8.1*  --  7.6*  PROT 6.3* 4.3* 6.2*  --   --   --   --   --   ALBUMIN 2.9* 2.0* 2.9*  --   --   --   --   --   AST 22 15 25   --   --   --   --   --   ALT 19 13* 18  --   --   --   --   --  ALKPHOS 71 47 67  --   --   --   --   --   BILITOT 0.2* 0.3 0.4  --   --   --   --   --   GFRNONAA 42* >60 39* 25*  --  17*  --  13*  GFRAA 48* >60 45* 29*  --  19*  --  15*  ANIONGAP 13 20* 13 16*  --  13  --  13   < > = values in this interval not displayed.     Hematology Recent Labs  Lab 10/05/17 1754 10/05/17 2035 10/06/17 0256  WBC 8.5 9.4 9.5  RBC 3.40* 3.47* 3.32*  HGB 9.9*  9.4* 9.6* 9.2*  HCT 29.0*  27.0* 27.6* 26.4*  MCV 79.4 79.5 79.5  MCH 27.6 27.7 27.7  MCHC 34.8 34.8 34.8  RDW 13.0 13.0 13.0  PLT 485* 493* 468*    Cardiac Enzymes Recent Labs  Lab 10/05/17 1754 10/05/17 2035 10/06/17 0256  TROPONINI 1.46* 1.20* 1.58*   No results for input(s): TROPIPOC in the last 168 hours.      Patient Profile     77 y.o. female with history of diabetes mellitus who presented with acute anterior myocardial infarction, starting 24-36 hours before presentation. LAD stenting performed. LAD flow was decreased postprocedure and there was evidence of microcirculatory obstruction. TIMI blush score 0.  Severe hyponatremia of uncertain duration, not responding to diuresis for heart failure management.  Worsening renal function for which nephrology is following.  Assessment & Plan    1.  ACS with late  presenting anterior MI due to LAD occlusion Clinically improved post PCI.  Continue medical management On ASA and brilenta BP is low and limits medical therapy On high potency statin On low dose beta blocker.  2. Acute on chronic systolic dysfunction EF 30% by echo 10/06/17 (reviewed) Complicated by worsening renal failure and severe hyponatremia Low BP limits medical therapy Not on ACE inhibitor due to renal function  3. Severe hyponatremia Not improving Appreciated nephrology help  4. Acute renal function Markedly abnormal renal function post cath is worrisome Agree with gentle hydration today as advised by Dr Allena KatzPatel Not on ACE inhibitor due to renal function  5. Anemia Stable No change required today Will need to follow closely on ASA and brilenta  Up to chair as able.  Need to start ambulation soon. Transfer to stepdown because of low Na.    I agree with Dr Katrinka BlazingSmith that her prognosis is guarded.  Appears stable otherwise from CV standpoint. Complicated patient with multiple acute medical issues.  A high level of decision making was required for this encounter.  Hillis RangeJames Estus Krakowski MD, [facility] 10/08/2017 12:07 PM

## 2017-10-08 NOTE — Progress Notes (Signed)
Patient ID: Amarya Kuehl, female   DOB: 03/26/1941, 77 y.o.   MRN: [id number] [facility] Progress Note   Assessment/ Plan:   1.  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  At this time, given the abrupt rise of her creatinine/decreasing urine output, suspect that indeed she might be having intravascular volume contraction.  I will stop furosemide, stop tolvaptan and give her some normal saline (250 cc over 5 hours) and review response. 2.  Acute kidney injury: Suspected to be hemodynamically mediated-possibly also with mild injury from contrast exposure.  At this time, will hold diuretics/tolvaptan and give some gentle fluids as I suspect she might be intravascularly contracted. 2.  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 3.  Acute systolic heart failure: LVEF 30%, continue efforts at volume unloading with furosemide.  Subjective:   Chest pain overnight with negative EKG for ACS, lack of response to both GI cocktail and morphine but felt better after diazepam.   Objective:   BP 93/61   Pulse 67   Temp (!) 97.5 F (36.4 C) (Axillary)   Resp 12   Ht 5' (1.524 m)   Wt 56.3 kg (124 lb 1.9 oz)   SpO2 95%   BMI 24.24 kg/m   Intake/Output Summary (Last 24 hours) at 10/08/2017 4098 Last data filed at 10/08/2017 0800 Gross per 24 hour  Intake 575 ml  Output 185 ml  Net 390 ml   Weight change: 0.3 kg (10.6 oz)  Physical Exam: Gen: Appears to be comfortable resting in bed, family at bedside.   CVS: Pulse regular rhythm, normal rate, S1 and S2 normal without any rub Resp: Fine rales right base otherwise clear to auscultation, no rhonchi Abd: Soft, flat,  nontender Ext: No lower extremity edema  Imaging: Dg Chest Port 1 View  Result Date: 10/06/2017 CLINICAL DATA:  MI. EXAM: PORTABLE CHEST 1 VIEW COMPARISON:  None. FINDINGS: Cardiomegaly. Perihilar and lower lobe airspace opacities, likely edema. Layering bilateral effusions. IMPRESSION: Cardiomegaly with mild pulmonary edema and layering effusions. Electronically Signed   By: Charlett Nose M.D.   On: 10/06/2017 10:37    Labs: BMET Recent Labs  Lab 10/05/17 1754 10/05/17 2035 10/06/17 0256 10/07/17 0545 10/07/17 1420 10/07/17 1824 10/07/17 2207 10/08/17 0702  NA 115*  116* 133* 116* 116* 118* 118* 114* 114*  K 5.5*  5.5* 3.2* 4.7 4.8  --  4.5  --  4.8  CL 85*  85* 102 83* 83*  --  83*  --  82*  CO2 17* 11* 20* 17*  --  22  --  19*  GLUCOSE 286*  295* 216* 157* 186*  --  111*  --  127*  BUN 29*  28* 21* 29* 32*  --  39*  --  44*  CREATININE 1.20*  1.20* 0.77 1.27* 1.85*  --  2.57*  --  3.20*  CALCIUM 8.1* 5.3* 8.2* 8.3*  --  8.1*  --  7.6*   CBC Recent Labs  Lab 10/05/17 1754 10/05/17 2035 10/06/17 0256  WBC 8.5 9.4 9.5  NEUTROABS  --  7.4  --   HGB 9.9*  9.4* 9.6* 9.2*  HCT 29.0*  27.0* 27.6* 26.4*  MCV 79.4 79.5 79.5  PLT 485* 493* 468*  Medications:    . aspirin  81 mg Oral Daily  . atorvastatin  80 mg Oral q1800  . colchicine  0.6 mg Oral BID  . insulin aspart  0-5 Units Subcutaneous QHS  . insulin aspart  0-9 Units Subcutaneous TID WC  . metoprolol tartrate  12.5 mg Oral BID  . sodium chloride flush  3 mL Intravenous Q12H  . ticagrelor  90 mg Oral BID  . tolvaptan  15 mg Oral Q24H   Zetta BillsJay Paxon Propes, MD 10/08/2017, 8:33 AM

## 2017-10-09 DIAGNOSIS — N170 Acute kidney failure with tubular necrosis: Secondary | ICD-10-CM

## 2017-10-09 LAB — GLUCOSE, CAPILLARY
GLUCOSE-CAPILLARY: 113 mg/dL — AB (ref 65–99)
GLUCOSE-CAPILLARY: 151 mg/dL — AB (ref 65–99)
Glucose-Capillary: 115 mg/dL — ABNORMAL HIGH (ref 65–99)
Glucose-Capillary: 127 mg/dL — ABNORMAL HIGH (ref 65–99)

## 2017-10-09 LAB — CBC
HCT: 25.4 % — ABNORMAL LOW (ref 36.0–46.0)
Hemoglobin: 8.7 g/dL — ABNORMAL LOW (ref 12.0–15.0)
MCH: 28 pg (ref 26.0–34.0)
MCHC: 34.3 g/dL (ref 30.0–36.0)
MCV: 81.7 fL (ref 78.0–100.0)
PLATELETS: 420 10*3/uL — AB (ref 150–400)
RBC: 3.11 MIL/uL — AB (ref 3.87–5.11)
RDW: 13.6 % (ref 11.5–15.5)
WBC: 8.3 10*3/uL (ref 4.0–10.5)

## 2017-10-09 LAB — SODIUM
SODIUM: 116 mmol/L — AB (ref 135–145)
SODIUM: 123 mmol/L — AB (ref 135–145)
SODIUM: 124 mmol/L — AB (ref 135–145)
Sodium: 118 mmol/L — CL (ref 135–145)
Sodium: 121 mmol/L — ABNORMAL LOW (ref 135–145)
Sodium: 124 mmol/L — ABNORMAL LOW (ref 135–145)

## 2017-10-09 LAB — BASIC METABOLIC PANEL
Anion gap: 13 (ref 5–15)
BUN: 48 mg/dL — AB (ref 6–20)
CALCIUM: 7.3 mg/dL — AB (ref 8.9–10.3)
CO2: 18 mmol/L — ABNORMAL LOW (ref 22–32)
Chloride: 88 mmol/L — ABNORMAL LOW (ref 101–111)
Creatinine, Ser: 3.86 mg/dL — ABNORMAL HIGH (ref 0.44–1.00)
GFR calc Af Amer: 12 mL/min — ABNORMAL LOW (ref >60)
GFR, EST NON AFRICAN AMERICAN: 10 mL/min — AB (ref >60)
Glucose, Bld: 115 mg/dL — ABNORMAL HIGH (ref 65–99)
POTASSIUM: 4.2 mmol/L (ref 3.5–5.1)
SODIUM: 119 mmol/L — AB (ref 135–145)

## 2017-10-09 MED ORDER — SODIUM CHLORIDE 3 % IV SOLN
INTRAVENOUS | Status: AC
  Filled 2017-10-09: qty 500

## 2017-10-09 MED ORDER — GI COCKTAIL ~~LOC~~
30.0000 mL | Freq: Once | ORAL | Status: AC
  Administered 2017-10-09: 30 mL via ORAL
  Filled 2017-10-09: qty 30

## 2017-10-09 NOTE — Progress Notes (Signed)
Patient ID: Sheila White, female   DOB: 07/16/1940, 77 y.o.   MRN: [id number] [facility] Progress Note   Assessment/ Plan:   1.  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  With unusual sequence of events 48 hours ago when she had oliguria, worsening renal function as well as worsening hyponatremia on furosemide/tolvaptan with soft blood pressures.  Given 500 cc normal saline to try to increase intravascular volume yesterday with sodium dropping from 118 to 114.  Thereafter started on 3% saline overnight with sodium this morning 119. 2.  Acute kidney injury: Suspected to be hemodynamically mediated from CHF exacerbation/renal hypoperfusion-possibly also with mild injury from contrast exposure.  Holding diuretics/tolvaptan with soft blood pressures at this time that is likely to compound management of her hyponatremia. 2.  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 3.  Acute systolic heart failure: LVEF 30%, diuretics currently on hold and will be started when renal function improves.  Subjective:   Had an episode of chest pain/mild dyspnea and transient hematuria yesterday.  Improving urine output noted this morning.   Objective:   BP 119/69   Pulse 81   Temp 98.3 F (36.8 C) (Oral)   Resp 12   Ht 5' (1.524 m)   Wt 57.5 kg (126 lb 12.2 oz)   SpO2 96%   BMI 24.76 kg/m   Intake/Output Summary (Last 24 hours) at 10/09/2017 0851 Last data filed at 10/09/2017 0700 Gross per 24 hour  Intake 890 ml  Output 438 ml  Net 452 ml   Weight change: 1.2 kg (2 lb 10.3 oz)  Physical Exam: Gen: Appears to be comfortable resting in bed, family at bedside.    CVS: Pulse regular rhythm, normal rate, S1 and S2 normal without any rub Resp: Anterior chest clear to auscultation without rales, no rhonchi Abd: Soft, flat, nontender Ext: No lower extremity edema  Imaging: [place] Ekg Site Rite  Result Date: 10/08/2017 If Site Rite image not attached, placement could not be confirmed due to current cardiac rhythm.   Labs: BMET Recent Labs  Lab 10/05/17 2035 10/06/17 0256 10/07/17 0545  10/07/17 1824  10/08/17 0702 10/08/17 1619 10/08/17 1932 10/08/17 2118 10/09/17 0022 10/09/17 0223 10/09/17 0645  NA 133* 116* 116*   < > 118*   < > 114* 113* 114* 115* 116* 118* 119*  K 3.2* 4.7 4.8  --  4.5  --  4.8 4.6  --   --   --   --  4.2  CL 102 83* 83*  --  83*  --  82* 81*  --   --   --   --  88*  CO2 11* 20* 17*  --  22  --  19* 20*  --   --   --   --  18*  GLUCOSE 216* 157* 186*  --  111*  --  127* 146*  --   --   --   --  115*  BUN 21* 29* 32*  --  39*  --  44* 47*  --   --   --   --  48*  CREATININE 0.77 1.27* 1.85*  --  2.57*  --  3.20* 3.62*  --   --   --   --  3.86*  CALCIUM 5.3* 8.2* 8.3*  --  8.1*  --  7.6* 7.5*  --   --   --   --  7.3*   < > = values in this interval not displayed.   CBC Recent Labs  Lab 10/05/17 1754 10/05/17 2035 10/06/17 0256 10/09/17 0645  WBC 8.5 9.4 9.5 8.3  NEUTROABS  --  7.4  --   --   HGB 9.9*  9.4* 9.6* 9.2* 8.7*  HCT 29.0*  27.0* 27.6* 26.4* 25.4*  MCV 79.4 79.5 79.5 81.7  PLT 485* 493* 468* 420*    Medications:    . aspirin  81 mg Oral Daily  . atorvastatin  80 mg Oral q1800  . colchicine  0.6 mg Oral Daily  . insulin aspart  0-5 Units Subcutaneous QHS  . insulin aspart  0-9 Units Subcutaneous TID WC  . metoprolol tartrate  12.5 mg Oral BID  . sodium chloride flush  3 mL Intravenous Q12H  . ticagrelor  90 mg Oral BID   Zetta Bills, MD 10/09/2017, 8:51 AM

## 2017-10-09 NOTE — Progress Notes (Signed)
Progress Note   Subjective   Doing well today, the patient denies CP or SOB.  She had epigastric pain yesterday which resolved after vomiting.  No further nausea.   No new concerns  Inpatient Medications    Scheduled Meds: . aspirin  81 mg Oral Daily  . atorvastatin  80 mg Oral q1800  . colchicine  0.6 mg Oral Daily  . insulin aspart  0-5 Units Subcutaneous QHS  . insulin aspart  0-9 Units Subcutaneous TID WC  . metoprolol tartrate  12.5 mg Oral BID  . sodium chloride flush  3 mL Intravenous Q12H  . ticagrelor  90 mg Oral BID   Continuous Infusions: . sodium chloride     PRN Meds: sodium chloride, acetaminophen, diazepam, morphine injection, nitroGLYCERIN, ondansetron (ZOFRAN) IV, pneumococcal 23 valent vaccine, sodium chloride flush, zolpidem   Vital Signs    Vitals:   10/09/17 0400 10/09/17 0500 10/09/17 0600 10/09/17 0700  BP: 108/67 98/61 116/62 119/69  Pulse: 81 76 80 81  Resp: 12 11 12 12   Temp:    98.3 F (36.8 C)  TempSrc:    Oral  SpO2: 95% 96% 94% 96%  Weight: 126 lb 12.2 oz (57.5 kg)     Height:        Intake/Output Summary (Last 24 hours) at 10/09/2017 0849 Last data filed at 10/09/2017 0700 Gross per 24 hour  Intake 890 ml  Output 438 ml  Net 452 ml   Filed Weights   10/07/17 0349 10/08/17 0500 10/09/17 0400  Weight: 123 lb 0.3 oz (55.8 kg) 124 lb 1.9 oz (56.3 kg) 126 lb 12.2 oz (57.5 kg)    Telemetry    sinus - Personally Reviewed  Physical Exam   GEN- The patient is ill appearing,sleepoing but rouses Head- normocephalic, atraumatic Eyes-  Sclera clear, conjunctiva pink Ears- hearing intact Oropharynx- clear Neck- supple, Lungs- Clear to ausculation bilaterally, normal work of breathing Heart- Regular rate and rhythm  GI- soft, NT, ND, + BS Extremities- no clubbing, cyanosis, or edema  MS- no significant deformity or atrophy Skin- no rash or lesion Psych- euthymic mood, full affect Neuro- strength and sensation are  intact   Labs    Chemistry Recent Labs  Lab 10/05/17 1754 10/05/17 2035 10/06/17 0256  10/08/17 0702 10/08/17 1619  10/09/17 0022 10/09/17 0223 10/09/17 0645  NA 115*  116* 133* 116*   < > 114* 113*   < > 116* 118* 119*  K 5.5*  5.5* 3.2* 4.7   < > 4.8 4.6  --   --   --  4.2  CL 85*  85* 102 83*   < > 82* 81*  --   --   --  88*  CO2 17* 11* 20*   < > 19* 20*  --   --   --  18*  GLUCOSE 286*  295* 216* 157*   < > 127* 146*  --   --   --  115*  BUN 29*  28* 21* 29*   < > 44* 47*  --   --   --  48*  CREATININE 1.20*  1.20* 0.77 1.27*   < > 3.20* 3.62*  --   --   --  3.86*  CALCIUM 8.1* 5.3* 8.2*   < > 7.6* 7.5*  --   --   --  7.3*  PROT 6.3* 4.3* 6.2*  --   --   --   --   --   --   --  ALBUMIN 2.9* 2.0* 2.9*  --   --   --   --   --   --   --   AST 22 15 25   --   --   --   --   --   --   --   ALT 19 13* 18  --   --   --   --   --   --   --   ALKPHOS 71 47 67  --   --   --   --   --   --   --   BILITOT 0.2* 0.3 0.4  --   --   --   --   --   --   --   GFRNONAA 42* >60 39*   < > 13* 11*  --   --   --  10*  GFRAA 48* >60 45*   < > 15* 13*  --   --   --  12*  ANIONGAP 13 20* 13   < > 13 12  --   --   --  13   < > = values in this interval not displayed.     Hematology Recent Labs  Lab 10/05/17 2035 10/06/17 0256 10/09/17 0645  WBC 9.4 9.5 8.3  RBC 3.47* 3.32* 3.11*  HGB 9.6* 9.2* 8.7*  HCT 27.6* 26.4* 25.4*  MCV 79.5 79.5 81.7  MCH 27.7 27.7 28.0  MCHC 34.8 34.8 34.3  RDW 13.0 13.0 13.6  PLT 493* 468* 420*    Cardiac Enzymes Recent Labs  Lab 10/05/17 1754 10/05/17 2035 10/06/17 0256  TROPONINI 1.46* 1.20* 1.58*   No results for input(s): TROPIPOC in the last 168 hours.     ekg yesterday reveals evolving anterior MI  Patient Profile   77 y.o.femalewith history of diabetes mellitus who presented with acute anterior myocardial infarction, starting 24-36 hours before presentation. LAD stenting performed. LAD flow was decreased postprocedure and there was  evidence of microcirculatory obstruction. TIMI blush score 0.Severe hyponatremia of uncertain duration, not responding to diuresis for heart failure management.  Worsening renal function for which nephrology is following.  Assessment & Plan    1.  Acute coronary syndrome with anterior MI (late presentation) due to occluded LAD On ASA and brilenta On high potency statin and low dose beta blocker Low BP limits management  2. Severe hyponatremia Appreciate nephrology assistance Now on hypertonic saline with close management   3. Acute renal function Continues to decline Appreciate nephrology input  4. Acute on chronic systolic dysfunction EF 30% Management limited by renal function and low BP Not on ace inhibitor due to renal function  5. Anemia Stable No change required today  Although stable from CV standpoint, overall prognosis is guarded.   Hillis Range MD, [facility] 10/09/2017 8:49 AM

## 2017-10-10 DIAGNOSIS — I241 Dressler's syndrome: Secondary | ICD-10-CM

## 2017-10-10 DIAGNOSIS — I5041 Acute combined systolic (congestive) and diastolic (congestive) heart failure: Secondary | ICD-10-CM | POA: Insufficient documentation

## 2017-10-10 DIAGNOSIS — I214 Non-ST elevation (NSTEMI) myocardial infarction: Secondary | ICD-10-CM | POA: Diagnosis present

## 2017-10-10 DIAGNOSIS — I255 Ischemic cardiomyopathy: Secondary | ICD-10-CM

## 2017-10-10 HISTORY — DX: Dressler's syndrome: I24.1

## 2017-10-10 HISTORY — DX: Acute combined systolic (congestive) and diastolic (congestive) heart failure: I50.41

## 2017-10-10 LAB — CBC
HEMATOCRIT: 29.6 % — AB (ref 36.0–46.0)
Hemoglobin: 9.8 g/dL — ABNORMAL LOW (ref 12.0–15.0)
MCH: 27.5 pg (ref 26.0–34.0)
MCHC: 33.1 g/dL (ref 30.0–36.0)
MCV: 83.1 fL (ref 78.0–100.0)
Platelets: 465 10*3/uL — ABNORMAL HIGH (ref 150–400)
RBC: 3.56 MIL/uL — AB (ref 3.87–5.11)
RDW: 14.1 % (ref 11.5–15.5)
WBC: 7.1 10*3/uL (ref 4.0–10.5)

## 2017-10-10 LAB — COMPREHENSIVE METABOLIC PANEL
ALT: 22 U/L (ref 14–54)
AST: 28 U/L (ref 15–41)
Albumin: 2.7 g/dL — ABNORMAL LOW (ref 3.5–5.0)
Alkaline Phosphatase: 105 U/L (ref 38–126)
Anion gap: 12 (ref 5–15)
BILIRUBIN TOTAL: 0.5 mg/dL (ref 0.3–1.2)
BUN: 46 mg/dL — AB (ref 6–20)
CO2: 20 mmol/L — ABNORMAL LOW (ref 22–32)
CREATININE: 3.84 mg/dL — AB (ref 0.44–1.00)
Calcium: 8.2 mg/dL — ABNORMAL LOW (ref 8.9–10.3)
Chloride: 97 mmol/L — ABNORMAL LOW (ref 101–111)
GFR calc Af Amer: 12 mL/min — ABNORMAL LOW (ref >60)
GFR, EST NON AFRICAN AMERICAN: 10 mL/min — AB (ref >60)
Glucose, Bld: 162 mg/dL — ABNORMAL HIGH (ref 65–99)
Potassium: 4.1 mmol/L (ref 3.5–5.1)
Sodium: 129 mmol/L — ABNORMAL LOW (ref 135–145)
TOTAL PROTEIN: 6.1 g/dL — AB (ref 6.5–8.1)

## 2017-10-10 LAB — GLUCOSE, CAPILLARY
GLUCOSE-CAPILLARY: 161 mg/dL — AB (ref 65–99)
Glucose-Capillary: 141 mg/dL — ABNORMAL HIGH (ref 65–99)
Glucose-Capillary: 130 mg/dL — ABNORMAL HIGH (ref 65–99)
Glucose-Capillary: 136 mg/dL — ABNORMAL HIGH (ref 65–99)

## 2017-10-10 MED ORDER — DOCUSATE SODIUM 100 MG PO CAPS
100.0000 mg | ORAL_CAPSULE | Freq: Every day | ORAL | Status: DC
  Administered 2017-10-10 – 2017-10-24 (×13): 100 mg via ORAL
  Filled 2017-10-10 (×15): qty 1

## 2017-10-10 MED ORDER — PANTOPRAZOLE SODIUM 40 MG PO TBEC
40.0000 mg | DELAYED_RELEASE_TABLET | Freq: Every day | ORAL | Status: DC
  Administered 2017-10-10 – 2017-10-11 (×2): 40 mg via ORAL
  Filled 2017-10-10 (×2): qty 1

## 2017-10-10 MED ORDER — POLYETHYLENE GLYCOL 3350 17 G PO PACK: 17.0000 g | PACK | Freq: Every day | ORAL | Status: DC | PRN

## 2017-10-10 MED ORDER — POLYETHYLENE GLYCOL 3350 17 G PO PACK
17.0000 g | PACK | Freq: Every day | ORAL | Status: DC
  Administered 2017-10-10: 17 g via ORAL
  Filled 2017-10-10: qty 1

## 2017-10-10 MED ORDER — SORBITOL 70 % SOLN
30.0000 mL | Freq: Once | Status: AC
  Administered 2017-10-10: 30 mL via ORAL
  Filled 2017-10-10: qty 30

## 2017-10-10 NOTE — Progress Notes (Addendum)
Progress Note  Patient Name: Sheila White Date of Encounter: 10/10/2017  Primary Cardiologist: No primary care provider on file.  Subjective   No chest pain this morning. Does c/o diffuse abd pain this morning but no nausea/vomiting.   Inpatient Medications    Scheduled Meds: . aspirin  81 mg Oral Daily  . atorvastatin  80 mg Oral q1800  . colchicine  0.6 mg Oral Daily  . insulin aspart  0-5 Units Subcutaneous QHS  . insulin aspart  0-9 Units Subcutaneous TID WC  . metoprolol tartrate  12.5 mg Oral BID  . sodium chloride flush  3 mL Intravenous Q12H  . ticagrelor  90 mg Oral BID   Continuous Infusions: . sodium chloride     PRN Meds: sodium chloride, acetaminophen, diazepam, morphine injection, nitroGLYCERIN, ondansetron (ZOFRAN) IV, pneumococcal 23 valent vaccine, sodium chloride flush, zolpidem   Vital Signs    Vitals:   10/10/17 0500 10/10/17 0600 10/10/17 0800 10/10/17 0809  BP: (!) 112/52 (!) 103/55 122/65   Pulse: 79 75 85   Resp: 11 10 19    Temp:   98.1 F (36.7 C) 98.1 F (36.7 C)  TempSrc:   Oral Oral  SpO2: 95% 95% 95%   Weight:      Height:        Intake/Output Summary (Last 24 hours) at 10/10/2017 0837 Last data filed at 10/10/2017 0800 Gross per 24 hour  Intake 672 ml  Output 1770 ml  Net -1098 ml   Filed Weights   10/07/17 0349 10/08/17 0500 10/09/17 0400  Weight: 123 lb 0.3 oz (55.8 kg) 124 lb 1.9 oz (56.3 kg) 126 lb 12.2 oz (57.5 kg)    Telemetry    SR - Personally Reviewed  ECG    N/a - Personally Reviewed  Physical Exam   General: Older hispanic female appearing in no acute distress. Head: Normocephalic, atraumatic.  Neck: Supple, no JVD. Lungs:  Resp regular and unlabored, CTA. Heart: RRR, S1, S2, no murmur. Abdomen: Soft, non-tender, non-distended with normoactive bowel sounds.  Extremities: No clubbing, cyanosis, edema. Distal pedal pulses are 2+ bilaterally. Neuro: Alert and oriented X 3. Moves all extremities  spontaneously. Psych: Normal affect.  Labs    Chemistry Recent Labs  Lab 10/05/17 1754 10/05/17 2035 10/06/17 0256  10/08/17 0702 10/08/17 1619  10/09/17 0645  10/09/17 1304 10/09/17 1616 10/09/17 1921  NA 115*  116* 133* 116*   < > 114* 113*   < > 119*   < > 124* 123* 124*  K 5.5*  5.5* 3.2* 4.7   < > 4.8 4.6  --  4.2  --   --   --   --   CL 85*  85* 102 83*   < > 82* 81*  --  88*  --   --   --   --   CO2 17* 11* 20*   < > 19* 20*  --  18*  --   --   --   --   GLUCOSE 286*  295* 216* 157*   < > 127* 146*  --  115*  --   --   --   --   BUN 29*  28* 21* 29*   < > 44* 47*  --  48*  --   --   --   --   CREATININE 1.20*  1.20* 0.77 1.27*   < > 3.20* 3.62*  --  3.86*  --   --   --   --  CALCIUM 8.1* 5.3* 8.2*   < > 7.6* 7.5*  --  7.3*  --   --   --   --   PROT 6.3* 4.3* 6.2*  --   --   --   --   --   --   --   --   --   ALBUMIN 2.9* 2.0* 2.9*  --   --   --   --   --   --   --   --   --   AST 22 15 25   --   --   --   --   --   --   --   --   --   ALT 19 13* 18  --   --   --   --   --   --   --   --   --   ALKPHOS 71 47 67  --   --   --   --   --   --   --   --   --   BILITOT 0.2* 0.3 0.4  --   --   --   --   --   --   --   --   --   GFRNONAA 42* >60 39*   < > 13* 11*  --  10*  --   --   --   --   GFRAA 48* >60 45*   < > 15* 13*  --  12*  --   --   --   --   ANIONGAP 13 20* 13   < > 13 12  --  13  --   --   --   --    < > = values in this interval not displayed.     Hematology Recent Labs  Lab 10/05/17 2035 10/06/17 0256 10/09/17 0645  WBC 9.4 9.5 8.3  RBC 3.47* 3.32* 3.11*  HGB 9.6* 9.2* 8.7*  HCT 27.6* 26.4* 25.4*  MCV 79.5 79.5 81.7  MCH 27.7 27.7 28.0  MCHC 34.8 34.8 34.3  RDW 13.0 13.0 13.6  PLT 493* 468* 420*    Cardiac Enzymes Recent Labs  Lab 10/05/17 1754 10/05/17 2035 10/06/17 0256  TROPONINI 1.46* 1.20* 1.58*   No results for input(s): TROPIPOC in the last 168 hours.   BNP Recent Labs  Lab 10/05/17 2035 10/06/17 1037  BNP 3,798.7*  2,455.9*     DDimer No results for input(s): DDIMER in the last 168 hours.    Radiology    [place] Ekg Site Rite  Result Date: 10/08/2017 If [facility] image not attached, placement could not be confirmed due to current cardiac rhythm.   Cardiac Studies   Cath: 10/05/17  Conclusion     Mid LAD lesion is 90% stenosed.  Prox LAD lesion is 99% stenosed.  Dist LAD lesion is 99% stenosed.  1st Mrg lesion is 95% stenosed.  Ost 2nd Mrg to 2nd Mrg lesion is 30% stenosed.  Ost RPDA to RPDA lesion is 30% stenosed.  Post intervention, there is a 0% residual stenosis.  Post intervention, there is a 0% residual stenosis.  Post intervention, there is a 35% residual stenosis.  A stent was successfully placed.  A stent was successfully placed.   Acute late presentation anterior ST segment elevation myocardial infarction with demonstration of subtotal long proximal LAD stenosis, 90% mid stenosis, and 99% apical stenosis with reduced apical flow.  Left circumflex disease with 50%  proximal OM1 stenosis followed by distal 95% marginal stenosis prior to giving off distal branches in the OM1 vessel.  Mild diffuse irregularity of a large dominant RCA with 30% narrowing in the PDA vessel.  LVEDP 34 mmHg/  Successful PCI to the LAD with ultimate insertion of a 2.526 mm Resolute DES stent postdilated to 2.8 mm proximally and 2.68 mm distally with the 99% stenosis being reduced to 0%; DES stenting of the mid 90% stenosis with ultimate insertion of a 2.2512 mm Resolute DES stent with residual narrowing at 0%, and initial 99% apical thrombotic stenosis with reduced flow treated with low-level PTCA and intracoronary verapamil with improvement to approximately 35-40% in a very small apical LAD segment.  RECOMMENDATION: DAPT for minimum of 1 year.  Diuresis with elevated EDP.  An echo Doppler study will be obtained in a.m.  Patient should be started on low-dose ACE inhibition, carvedilol,  probable nitrate therapy, and high potency statin treatment.   TTE: 10/06/17  Study Conclusions  - Left ventricle: The cavity size was normal. There was mild focal   basal hypertrophy of the septum. Mid to apical   inferoseptal/anteroseptal akinesis, apical lateral akinesis, mid   to apical anterior akinesis, apical inferior akinesis, akinesis   of the true apex. The estimated ejection fraction was 30%.   Features are consistent with a pseudonormal left ventricular   filling pattern, with concomitant abnormal relaxation and   increased filling pressure (grade 2 diastolic dysfunction). - Aortic valve: Trileaflet; moderately calcified leaflets.   Sclerosis without stenosis. There was trivial regurgitation. - Mitral valve: Mildly calcified annulus. There was trivial   regurgitation. - Right ventricle: The cavity size was normal. Systolic function   was normal. - Pulmonary arteries: No complete TR doppler jet so unable to   estimate PA systolic pressure. - Inferior vena cava: The vessel was normal in size. The   respirophasic diameter changes were in the normal range (= 50%),   consistent with normal central venous pressure. - Pericardium, extracardiac: A trivial pericardial effusion was   identified.  Impressions:  - Normal LV size with mild focal basal septal hypertrophy. EF 30%   with wall motion abnormalities as noted above in LAD   distribution. Normal RV size and systolic function. Aortic valve   sclerosis without significant stenosis.  Patient Profile     77 y.o. female with history of diabetes mellitus who presented with acute anterior myocardial infarction, starting 24-36 hours before presentation. LAD stenting performed. LAD flow was decreased postprocedure and there was evidence of microcirculatory obstruction. TIMI blush score 0.Severe hyponatremia of uncertain duration, not responding to diuresis for heart failure management.Worsening renal function for which  nephrology is following.  Assessment & Plan    1. Late presenting MI: Had PCI/DES to the p/mLAD with ballooning to distal segment. Placed on DAPT with ASA/Brilinta. Struggled with chest pain post MI felt to be related to pericarditis. Given decadron with improvement. No further reports of chest pain. On statin, and low dose BB.   2. Severe hyponatremia: Nephrology following. Was placed on hypertonic saline. Na+ 124 last evening.  -BMET pending this morning.  3. ICM: EF 30% on echo. Did struggle with oliguria post cath, but now seems to be diuresing well. No signs of overload on exam  4. Acute renal failure: Cr continues to rise, at 3.86 last evening. Possible CN? Cr pending this am. Nephrology following.   5. Anemia: Hgb has been around 9.5, down to 8.7 today. No signs of  overt bleeding.  -- follow CBC  6. Abd pain: States she has not a BM since admission. Will add colace today.   Signed, Laverda Page, NP  10/10/2017, 8:37 AM  Pager # [phone]   For questions or updates, please contact [facility] Please consult [email or web address] for contact info under Cardiology/STEMI.

## 2017-10-10 NOTE — Progress Notes (Signed)
CARDIAC REHAB PHASE I   PRE:  Rate/Rhythm: 65 SR    BP: sitting 118/65    SaO2: 96 RA  MODE:  Ambulation: to BSC   POST:  Rate/Rhythm: 80 SR    BP: sitting 93/51 on BSC     SaO2:   Came to ambulate pt however she sts she needs to have BM. [facility]. Pt sat on BSC for 15-20 min with seemingly large BM and diarrhea. She c/o weakness and when we took her BP, it was lower. We will allow her to rest after BM in recliner or bed. Will f/u tomorrow to ambulate. Family present. Gave her daughter stent card. [id number]  Harriet MassonRandi Kristan Artemis Koller CES, ACSM 10/10/2017 11:47 AM

## 2017-10-10 NOTE — Progress Notes (Signed)
Patient ID: Sheila White, female   DOB: 07/16/1940, 77 y.o.   MRN: [id number] [facility] Progress Note   Assessment/ Plan:   1.  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  With unusual sequence of events leading to worsening hyponatremia: oliguria, worsening renal function as well as worsening hyponatremia on furosemide/tolvaptan with soft blood pressures.  Required a short course of 3% saline.  Hopeful with increased UOP water diuresis achieved.  Will follow labs from today.   2.  Acute kidney injury: Suspected to be hemodynamically mediated from CHF exacerbation/renal hypoperfusion-possibly also with mild injury from contrast exposure.  Holding diuretics/tolvaptan with soft blood pressures at this time that is likely to compound management of her hyponatremia. 2.  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  On colchicine. 3.  Acute systolic heart failure: LVEF 30%, diuretics currently on hold and will be started when renal function improves.  Subjective:    Urine output continues to improve.  3% stopped yesterday, last Na 124.  Labs pending this AM.     Objective:   BP 122/65 (BP Location: Right Arm)   Pulse 85   Temp 98.1 F (36.7 C) (Oral)   Resp 19   Ht 5' (1.524 m)   Wt 57.5 kg (126 lb 12.2 oz)   SpO2 95%   BMI 24.76 kg/m   Intake/Output Summary (Last 24 hours) at 10/10/2017 [id number] Last data filed at 10/10/2017 0800 Gross per 24 hour  Intake 606 ml  Output 1695 ml  Net -1089 ml   Weight change:   Physical Exam: Gen: Appears to be comfortable resting in bed, family at bedside.   HEENT: prominent JVP CVS: Pulse regular rhythm,  normal rate, S1 and S2 normal without any rub Resp: Anterior chest clear to auscultation without rales, no rhonchi Abd: Soft, flat, nontender Ext: No lower extremity edema  Imaging: [place] Ekg Site Rite  Result Date: 10/08/2017 If Site Rite image not attached, placement could not be confirmed due to current cardiac rhythm.   Labs: BMET Recent Labs  Lab 10/05/17 2035 10/06/17 0256 10/07/17 0545  10/07/17 1824  10/08/17 0702 10/08/17 1619  10/09/17 0022 10/09/17 0223 10/09/17 0645 10/09/17 0917 10/09/17 1304 10/09/17 1616 10/09/17 1921  NA 133* 116* 116*   < > 118*   < > 114* 113*   < > 116* 118* 119* 121* 124* 123* 124*  K 3.2* 4.7 4.8  --  4.5  --  4.8 4.6  --   --   --  4.2  --   --   --   --   CL 102 83* 83*  --  83*  --  82* 81*  --   --   --  88*  --   --   --   --   CO2 11* 20* 17*  --  22  --  19* 20*  --   --   --  18*  --   --   --   --   GLUCOSE 216* 157* 186*  --  111*  --  127* 146*  --   --   --  115*  --   --   --   --  BUN 21* 29* 32*  --  39*  --  44* 47*  --   --   --  48*  --   --   --   --   CREATININE 0.77 1.27* 1.85*  --  2.57*  --  3.20* 3.62*  --   --   --  3.86*  --   --   --   --   CALCIUM 5.3* 8.2* 8.3*  --  8.1*  --  7.6* 7.5*  --   --   --  7.3*  --   --   --   --    < > = values in this interval not displayed.   CBC Recent Labs  Lab 10/05/17 1754 10/05/17 2035 10/06/17 0256 10/09/17 0645  WBC 8.5 9.4 9.5 8.3  NEUTROABS  --  7.4  --   --   HGB 9.9*  9.4* 9.6* 9.2* 8.7*  HCT 29.0*  27.0* 27.6* 26.4* 25.4*  MCV 79.4 79.5 79.5 81.7  PLT 485* 493* 468* 420*    Medications:    . aspirin  81 mg Oral Daily  . atorvastatin  80 mg Oral q1800  . colchicine  0.6 mg Oral Daily  . docusate sodium  100 mg Oral Daily  . insulin aspart  0-5 Units Subcutaneous QHS  . insulin aspart  0-9 Units Subcutaneous TID WC  . metoprolol tartrate  12.5 mg Oral BID  . pantoprazole  40 mg Oral Daily  . polyethylene glycol  17 g Oral Daily  . sodium chloride  flush  3 mL Intravenous Q12H  . ticagrelor  90 mg Oral BID   Bufford Buttner MD [facility] pgr [phone] 10/10/2017, 9:21 AM

## 2017-10-10 NOTE — Plan of Care (Signed)
  Problem: Elimination: Goal: Will not experience complications related to bowel motility Outcome: Progressing   Problem: Pain Managment: Goal: General experience of comfort will improve Outcome: Progressing   Problem: Elimination: Goal: Will not experience complications related to urinary retention Outcome: Not Applicable

## 2017-10-11 ENCOUNTER — Inpatient Hospital Stay (HOSPITAL_COMMUNITY): Payer: Medicaid Other

## 2017-10-11 DIAGNOSIS — N179 Acute kidney failure, unspecified: Secondary | ICD-10-CM

## 2017-10-11 DIAGNOSIS — D649 Anemia, unspecified: Secondary | ICD-10-CM

## 2017-10-11 DIAGNOSIS — R0789 Other chest pain: Secondary | ICD-10-CM

## 2017-10-11 LAB — BASIC METABOLIC PANEL
ANION GAP: 11 (ref 5–15)
BUN: 36 mg/dL — ABNORMAL HIGH (ref 6–20)
CALCIUM: 8.1 mg/dL — AB (ref 8.9–10.3)
CO2: 19 mmol/L — AB (ref 22–32)
CREATININE: 3.24 mg/dL — AB (ref 0.44–1.00)
Chloride: 100 mmol/L — ABNORMAL LOW (ref 101–111)
GFR, EST AFRICAN AMERICAN: 15 mL/min — AB (ref >60)
GFR, EST NON AFRICAN AMERICAN: 13 mL/min — AB (ref >60)
GLUCOSE: 178 mg/dL — AB (ref 65–99)
Potassium: 3.8 mmol/L (ref 3.5–5.1)
Sodium: 130 mmol/L — ABNORMAL LOW (ref 135–145)

## 2017-10-11 LAB — CBC
HCT: 29.3 % — ABNORMAL LOW (ref 36.0–46.0)
HEMOGLOBIN: 9.6 g/dL — AB (ref 12.0–15.0)
MCH: 27.4 pg (ref 26.0–34.0)
MCHC: 32.8 g/dL (ref 30.0–36.0)
MCV: 83.5 fL (ref 78.0–100.0)
PLATELETS: 449 10*3/uL — AB (ref 150–400)
RBC: 3.51 MIL/uL — AB (ref 3.87–5.11)
RDW: 14.1 % (ref 11.5–15.5)
WBC: 6.8 10*3/uL (ref 4.0–10.5)

## 2017-10-11 LAB — GLUCOSE, CAPILLARY
GLUCOSE-CAPILLARY: 170 mg/dL — AB (ref 65–99)
GLUCOSE-CAPILLARY: 186 mg/dL — AB (ref 65–99)
GLUCOSE-CAPILLARY: 149 mg/dL — AB (ref 65–99)
Glucose-Capillary: 141 mg/dL — ABNORMAL HIGH (ref 65–99)

## 2017-10-11 MED ORDER — POLYETHYLENE GLYCOL 3350 17 G PO PACK
17.0000 g | PACK | Freq: Every day | ORAL | Status: DC
  Administered 2017-10-12 – 2017-10-24 (×9): 17 g via ORAL
  Filled 2017-10-11 (×11): qty 1

## 2017-10-11 MED ORDER — PANTOPRAZOLE SODIUM 40 MG PO TBEC
40.0000 mg | DELAYED_RELEASE_TABLET | Freq: Two times a day (BID) | ORAL | Status: DC
  Administered 2017-10-11 – 2017-10-24 (×26): 40 mg via ORAL
  Filled 2017-10-11 (×25): qty 1

## 2017-10-11 MED ORDER — POLYETHYLENE GLYCOL 3350 17 G PO PACK: 17.0000 g | PACK | Freq: Every day | ORAL | Status: DC

## 2017-10-11 NOTE — Progress Notes (Signed)
Patient ID: Sheila White, female   DOB: [birth year], 77 y.o.   MRN: [id number] [facility] Progress Note   Assessment/ Plan:    1.  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  She required 3% NS 4/14 which resulted in a good response- water diuresis.  Her Na is now 130 off 3% and also Lasix and she appears to be autodiuresing.  I would hold Lasix at least one more day, follow labs tomorrow, and consider restarting tomorrow AM as well.   2.  Acute kidney injury: Suspected to be hemodynamically mediated from CHF exacerbation/renal hypoperfusion-possibly also with mild injury from contrast exposure.  This is improving.  2.  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  On colchicine.  3.  Acute systolic heart failure: LVEF 30%, diuretics currently on hold and will be started when renal function improves as above.  4.  Dispo: From renal perspective will likely be able to d/c home in the next couple of days  Subjective:    Na now 130, Cr downtrending at 3.2.  Urine output picking up --> looks like she's autodiuresing.      Objective:   BP (!) 105/59 (BP Location: Right Arm)   Pulse 78   Temp 98.1 F (36.7 C) (Oral)   Resp 14   Ht 5' (1.524 m)   Wt 54.7 kg (120 lb 9.5 oz)   SpO2 95%   BMI 23.55 kg/m   Intake/Output Summary (Last 24 hours) at 10/11/2017 1333 Last data filed at 10/11/2017 1215 Gross per 24 hour  Intake 360 ml  Output 1335 ml  Net -975 ml   Weight change:   Physical Exam: Gen: Appears to be comfortable resting in bed, family at bedside.   HEENT: JVP improved CVS: Pulse regular rhythm, normal rate, S1 and S2 normal without any rub Resp: Anterior chest clear to auscultation without rales, no rhonchi Abd: Soft, flat, nontender Ext:  No lower extremity edema  Imaging: No results found.  Labs: BMET Recent Labs  Lab 10/07/17 0545  10/07/17 1824  10/08/17 0702 10/08/17 1619  10/09/17 0645 10/09/17 0917 10/09/17 1304 10/09/17 1616 10/09/17 1921 10/10/17 1048 10/11/17 0742  NA 116*   < > 118*   < > 114* 113*   < > 119* 121* 124* 123* 124* 129* 130*  K 4.8  --  4.5  --  4.8 4.6  --  4.2  --   --   --   --  4.1 3.8  CL 83*  --  83*  --  82* 81*  --  88*  --   --   --   --  97* 100*  CO2 17*  --  22  --  19* 20*  --  18*  --   --   --   --  20* 19*  GLUCOSE 186*  --  111*  --  127* 146*  --  115*  --   --   --   --  162* 178*  BUN 32*  --  39*  --  44* 47*  --  48*  --   --   --   --  46* 36*  CREATININE 1.85*  --  2.57*  --  3.20* 3.62*  --  3.86*  --   --   --   --  3.84* 3.24*  CALCIUM 8.3*  --  8.1*  --  7.6* 7.5*  --  7.3*  --   --   --   --  8.2* 8.1*   < > = values in this interval not displayed.   CBC Recent Labs  Lab 10/05/17 2035 10/06/17 0256 10/09/17 0645 10/10/17 1048 10/11/17 0742  WBC 9.4 9.5 8.3 7.1 6.8  NEUTROABS 7.4  --   --   --   --   HGB 9.6* 9.2* 8.7* 9.8* 9.6*  HCT 27.6* 26.4* 25.4* 29.6* 29.3*  MCV 79.5 79.5 81.7 83.1 83.5  PLT 493* 468* 420* 465* 449*    Medications:    . aspirin  81 mg Oral Daily  . atorvastatin  80 mg Oral q1800  . colchicine  0.6 mg Oral Daily  . docusate sodium  100 mg Oral Daily  . insulin aspart  0-5 Units Subcutaneous QHS  . insulin aspart  0-9 Units Subcutaneous TID WC  . metoprolol tartrate  12.5 mg Oral BID  . pantoprazole  40 mg Oral Daily  . sodium chloride flush  3 mL Intravenous Q12H  . ticagrelor  90 mg Oral BID   Bufford ButtnerElizabeth Lonnel Gjerde MD [facility] pgr [phone] 10/11/2017, 1:33 PM

## 2017-10-11 NOTE — Progress Notes (Signed)
Progress Note  Patient Name: Sheila White Date of Encounter: 10/11/2017  Primary Cardiologist: No primary care provider on file.  Subjective   No chest pain this morning. Does c/o diffuse abd pain this morning but no nausea/vomiting.   Inpatient Medications    Scheduled Meds: . aspirin  81 mg Oral Daily  . atorvastatin  80 mg Oral q1800  . colchicine  0.6 mg Oral Daily  . docusate sodium  100 mg Oral Daily  . insulin aspart  0-5 Units Subcutaneous QHS  . insulin aspart  0-9 Units Subcutaneous TID WC  . metoprolol tartrate  12.5 mg Oral BID  . pantoprazole  40 mg Oral Daily  . sodium chloride flush  3 mL Intravenous Q12H  . ticagrelor  90 mg Oral BID   Continuous Infusions: . sodium chloride     PRN Meds: sodium chloride, acetaminophen, diazepam, morphine injection, nitroGLYCERIN, ondansetron (ZOFRAN) IV, pneumococcal 23 valent vaccine, polyethylene glycol, sodium chloride flush, zolpidem   Vital Signs    Vitals:   10/10/17 2301 10/11/17 0427 10/11/17 0740 10/11/17 0802  BP: 139/75 104/76  105/62  Pulse: 81 84    Resp: 15 12 (!) 21 16  Temp: 97.7 F (36.5 C) (!) 97.5 F (36.4 C)  97.7 F (36.5 C)  TempSrc: Oral Oral  Oral  SpO2: 95% 94% 93% 93%  Weight: 120 lb 9.5 oz (54.7 kg) 120 lb 9.5 oz (54.7 kg)    Height:        Intake/Output Summary (Last 24 hours) at 10/11/2017 1133 Last data filed at 10/11/2017 1004 Gross per 24 hour  Intake 360 ml  Output 1210 ml  Net -850 ml   Filed Weights   10/09/17 0400 10/10/17 2301 10/11/17 0427  Weight: 126 lb 12.2 oz (57.5 kg) 120 lb 9.5 oz (54.7 kg) 120 lb 9.5 oz (54.7 kg)    Telemetry    SR - Personally Reviewed  ECG    N/a - Personally Reviewed  Physical Exam   General: Older hispanic female appearing in no acute distress. Head: Normocephalic, atraumatic.  Neck: Supple, no JVD. Lungs:  Resp regular and unlabored, CTA. Heart: RRR, S1, S2, no murmur. Abdomen: Soft, non-tender, non-distended with normoactive  bowel sounds.  Extremities: No clubbing, cyanosis, edema. Distal pedal pulses are 2+ bilaterally. Neuro: Alert and oriented X 3. Moves all extremities spontaneously. Psych: Normal affect.  Labs    Chemistry Recent Labs  Lab 10/05/17 2035 10/06/17 0256  10/09/17 0645  10/09/17 1921 10/10/17 1048 10/11/17 0742  NA 133* 116*   < > 119*   < > 124* 129* 130*  K 3.2* 4.7   < > 4.2  --   --  4.1 3.8  CL 102 83*   < > 88*  --   --  97* 100*  CO2 11* 20*   < > 18*  --   --  20* 19*  GLUCOSE 216* 157*   < > 115*  --   --  162* 178*  BUN 21* 29*   < > 48*  --   --  46* 36*  CREATININE 0.77 1.27*   < > 3.86*  --   --  3.84* 3.24*  CALCIUM 5.3* 8.2*   < > 7.3*  --   --  8.2* 8.1*  PROT 4.3* 6.2*  --   --   --   --  6.1*  --   ALBUMIN 2.0* 2.9*  --   --   --   --  2.7*  --   AST 15 25  --   --   --   --  28  --   ALT 13* 18  --   --   --   --  22  --   ALKPHOS 47 67  --   --   --   --  105  --   BILITOT 0.3 0.4  --   --   --   --  0.5  --   GFRNONAA >60 39*   < > 10*  --   --  10* 13*  GFRAA >60 45*   < > 12*  --   --  12* 15*  ANIONGAP 20* 13   < > 13  --   --  12 11   < > = values in this interval not displayed.     Hematology Recent Labs  Lab 10/09/17 0645 10/10/17 1048 10/11/17 0742  WBC 8.3 7.1 6.8  RBC 3.11* 3.56* 3.51*  HGB 8.7* 9.8* 9.6*  HCT 25.4* 29.6* 29.3*  MCV 81.7 83.1 83.5  MCH 28.0 27.5 27.4  MCHC 34.3 33.1 32.8  RDW 13.6 14.1 14.1  PLT 420* 465* 449*    Cardiac Enzymes Recent Labs  Lab 10/05/17 1754 10/05/17 2035 10/06/17 0256  TROPONINI 1.46* 1.20* 1.58*   No results for input(s): TROPIPOC in the last 168 hours.   BNP Recent Labs  Lab 10/05/17 2035 10/06/17 1037  BNP 3,798.7* 2,455.9*     DDimer No results for input(s): DDIMER in the last 168 hours.    Radiology    No results found.  Cardiac Studies   Cath: 10/05/17  Conclusion     Mid LAD lesion is 90% stenosed.  Prox LAD lesion is 99% stenosed.  Dist LAD lesion is 99%  stenosed.  1st Mrg lesion is 95% stenosed.  Ost 2nd Mrg to 2nd Mrg lesion is 30% stenosed.  Ost RPDA to RPDA lesion is 30% stenosed.  Post intervention, there is a 0% residual stenosis.  Post intervention, there is a 0% residual stenosis.  Post intervention, there is a 35% residual stenosis.  A stent was successfully placed.  A stent was successfully placed.   Acute late presentation anterior ST segment elevation myocardial infarction with demonstration of subtotal long proximal LAD stenosis, 90% mid stenosis, and 99% apical stenosis with reduced apical flow.  Left circumflex disease with 50% proximal OM1 stenosis followed by distal 95% marginal stenosis prior to giving off distal branches in the OM1 vessel.  Mild diffuse irregularity of a large dominant RCA with 30% narrowing in the PDA vessel.  LVEDP 34 mmHg/  Successful PCI to the LAD with ultimate insertion of a 2.526 mm Resolute DES stent postdilated to 2.8 mm proximally and 2.68 mm distally with the 99% stenosis being reduced to 0%; DES stenting of the mid 90% stenosis with ultimate insertion of a 2.2512 mm Resolute DES stent with residual narrowing at 0%, and initial 99% apical thrombotic stenosis with reduced flow treated with low-level PTCA and intracoronary verapamil with improvement to approximately 35-40% in a very small apical LAD segment.  RECOMMENDATION: DAPT for minimum of 1 year.  Diuresis with elevated EDP.  An echo Doppler study will be obtained in a.m.  Patient should be started on low-dose ACE inhibition, carvedilol, probable nitrate therapy, and high potency statin treatment.   TTE: 10/06/17  Study Conclusions  - Left ventricle: The cavity size was normal. There was mild focal   basal  hypertrophy of the septum. Mid to apical   inferoseptal/anteroseptal akinesis, apical lateral akinesis, mid   to apical anterior akinesis, apical inferior akinesis, akinesis   of the true apex. The estimated ejection  fraction was 30%.   Features are consistent with a pseudonormal left ventricular   filling pattern, with concomitant abnormal relaxation and   increased filling pressure (grade 2 diastolic dysfunction). - Aortic valve: Trileaflet; moderately calcified leaflets.   Sclerosis without stenosis. There was trivial regurgitation. - Mitral valve: Mildly calcified annulus. There was trivial   regurgitation. - Right ventricle: The cavity size was normal. Systolic function   was normal. - Pulmonary arteries: No complete TR doppler jet so unable to   estimate PA systolic pressure. - Inferior vena cava: The vessel was normal in size. The   respirophasic diameter changes were in the normal range (= 50%),   consistent with normal central venous pressure. - Pericardium, extracardiac: A trivial pericardial effusion was   identified.  Impressions:  - Normal LV size with mild focal basal septal hypertrophy. EF 30%   with wall motion abnormalities as noted above in LAD   distribution. Normal RV size and systolic function. Aortic valve   sclerosis without significant stenosis.  Patient Profile     77 y.o. female with history of diabetes mellitus who presented with acute anterior myocardial infarction, starting 24-36 hours before presentation. LAD stenting performed. LAD flow was decreased postprocedure and there was evidence of microcirculatory obstruction. TIMI blush score 0.Severe hyponatremia of uncertain duration, not responding to diuresis for heart failure management.Worsening renal function for which nephrology is following.  Assessment & Plan    1. Late presenting MI: Had PCI/DES to the p/mLAD with ballooning to distal segment. Placed on DAPT with ASA/Brilinta. Struggled with chest pain post MI felt to be related to pericarditis. Given decadron with improvement. No further reports of chest pain. On statin, and low dose BB.   2. Severe hyponatremia: Nephrology following. Was placed on  hypertonic saline.. Na has increased to 130  3. ICM: EF 30% on echo. Did struggle with oliguria post cath, but now seems to be diuresing well. No signs of overload on exam  4. Acute renal failure: Cr continues to rise, at 3.86---> 3.24  Possible CN? Nephrology following.   5. Anemia: Hgb has been around 9.5, down to 8.7---> 9.6. No signs of overt bleeding.  -- follow CBC  6. Abd pain: States she has not a BM since admission. Will add colace today. Had bowel movement yesterday but still complaining of abdominal pain. Abdomen soft and she has active bowel sounds. I am going to get GI service to evaluate.    Signed, Nanetta Batty, MD  10/11/2017, 11:33 AM  Pager # [phone]   For questions or updates, please contact [facility] Please consult [email or web address] for contact info under Cardiology/STEMI.

## 2017-10-11 NOTE — Progress Notes (Signed)
CARDIAC REHAB PHASE I   PRE:  Rate/Rhythm: 82 SR    BP: sitting 118/65    SaO2: 96 RA  MODE:  Ambulation: 100 ft   POST:  Rate/Rhythm: 87 SR    BP: sitting 55/30?, recheck 94/72     SaO2: 98 RA  Pt sleepy, had been in bed all day. Pt weak, dizzy, debilitated. Used RW, gait belt, assist x2 (followed with rollator) and had difficulty steering RW and standing upright. Very slow pace. She was not aware of fatiguing and we asked her to turn around. Then at end of walk, she sts she was dizzy the whole walk. To recliner, daughter present. BP initially low first check, recheck ok. Pt needs PT to help strengthen. Daughter with pt in room, in recliner. Encouraged more mobility.  [phone]   Harriet MassonRandi Kristan Thimothy Barretta CES, ACSM 10/11/2017 2:07 PM

## 2017-10-11 NOTE — Consult Note (Signed)
Gastroenterology Consult: 2:21 PM 10/11/2017  LOS: 6 days    Referring Provider: Dr Allyson SabalBerry  Primary Care Physician:  No primary care provider on file. Primary Gastroenterologist:  Dr. Allyson SabalBerry.      Reason for Consultation: Abdominal pain.  Nausea.   HPI: Sheila White is a 77 y.o. female.  Patient does not speak [place].  She is hard of hearing.  Bulk of the history was obtained using her daughter as a Nurse, learning disabilitytranslator.  History of NIDDM. No previous colonoscopy or upper endoscopy.  She was admitted almost a week ago with complaint of weakness and dyspnea.  The day prior to admission her PCP had started her on antibiotic for suspected CAP.  She developed back pain which radiated to the chest on the evening prior to presenting to the emergency department. EKG in the ED showed Q waves and she was taken to the Cath Lab. At cardiac cath she had PCI with drug-eluting stent placement to the LAD and ballooning of the distal LAD.  She is now on aspirin and Brilinta.  Her EF is 30% on echo.  Post procedure pain attributed to pericarditis and improved with Decadron and colchicine.  Hyponatremia and AK I managed by nephrology.  She is anemic.  At arrival her Hgb was 9.4 and it is remained steady, today it is 9.6.  MCV at its lowest 79.4.  Platelets in the 400s.  LFTs are normal her albumin is low The only imaging is a portable chest x-ray from a week ago showing cardiomegaly and mild pulmonary edema and layering effusions. She is complaining of abdominal pain and had not had a bowel movement since admission as of this morning so Dr. Gery PrayBarry gave her a dose of MiraLAX yesterday and added daily Colace.  He also started her on once daily Protonix.    For 3 weeks or so prior to admission she lost her appetite and was not eating as well.  Prior  to that she ate well and had stable weight.  She has pain in 2 separate locations.  One location is the epigastrium and up into her chest.  It is generally postprandial.  The other pain is in her lower abdomen and it has resolved since she had a bowel movement yesterday.    Family history of GI cancers or ulcer disease.  Does not use NSAIDs at home.     Past Medical History:  Diagnosis Date  . HOH (hard of hearing)   . Non-insulin dependent type 2 diabetes mellitus [facility](HCC)     Past Surgical History:  Procedure Laterality Date  . CORONARY/GRAFT ACUTE MI REVASCULARIZATION N/A 10/05/2017   Procedure: Coronary/Graft Acute MI Revascularization;  Surgeon: Lennette BihariKelly, Thomas A, MD;  [facility];  Service: Cardiovascular;  Laterality: N/A;  . LEFT HEART CATH AND CORONARY ANGIOGRAPHY N/A 10/05/2017   Procedure: LEFT HEART CATH AND CORONARY ANGIOGRAPHY;  Surgeon: Lennette BihariKelly, Thomas A, MD;  Location: [facility] INVASIVE CV LAB;  Service: Cardiovascular;  Laterality: N/A;    Prior to Admission medications   Medication Sig  Start Date End Date Taking? Authorizing Provider  glimepiride (AMARYL) 2 MG tablet Take 2 mg by mouth daily with breakfast.   Yes [provider]  metFORMIN (GLUCOPHAGE) 1000 MG tablet Take 1,000 mg by mouth daily with breakfast.   Yes [provider]  nitrofurantoin (MACRODANTIN) 50 MG capsule Take 50 mg by mouth 4 (four) times daily.   Yes [provider]  predniSONE (DELTASONE) 10 MG tablet Take 10 mg by mouth daily with breakfast. 10/04/17 10/11/17 Yes [provider]    Scheduled Meds: . aspirin  81 mg Oral Daily  . atorvastatin  80 mg Oral q1800  . colchicine  0.6 mg Oral Daily  . docusate sodium  100 mg Oral Daily  . insulin aspart  0-5 Units Subcutaneous QHS  . insulin aspart  0-9 Units Subcutaneous TID WC  . metoprolol tartrate  12.5 mg Oral BID  . pantoprazole  40 mg Oral Daily  . sodium chloride flush  3 mL Intravenous Q12H  .  ticagrelor  90 mg Oral BID   Infusions: . sodium chloride     PRN Meds: sodium chloride, acetaminophen, diazepam, morphine injection, nitroGLYCERIN, ondansetron (ZOFRAN) IV, pneumococcal 23 valent vaccine, polyethylene glycol, sodium chloride flush, zolpidem   Allergies as of 10/05/2017  . (No Known Allergies)    History reviewed. No pertinent family history.  Social History   Socioeconomic History  . Marital status: Widowed    Spouse name: Not on file  . Number of children: Not on file  . Years of education: Not on file  . Highest education level: Not on file  Occupational History  . Not on file  Social Needs  . Financial resource strain: Not on file  . Food insecurity:    Worry: Not on file    Inability: Not on file  . Transportation needs:    Medical: Not on file    Non-medical: Not on file  Tobacco Use  . Smoking status: Never Smoker  . Smokeless tobacco: Never Used  Substance and Sexual Activity  . Alcohol use: Not Currently  . Drug use: Never  . Sexual activity: Not Currently  Lifestyle  . Physical activity:    Days per week: Not on file    Minutes per session: Not on file  . Stress: Not on file  Relationships  . Social connections:    Talks on phone: Not on file    Gets together: Not on file    Attends religious service: Not on file    Active member of club or organization: Not on file    Attends meetings of clubs or organizations: Not on file    Relationship status: Not on file  . Intimate partner violence:    Fear of current or ex partner: Not on file    Emotionally abused: Not on file    Physically abused: Not on file    Forced sexual activity: Not on file  Other Topics Concern  . Not on file  Social History Narrative  . Not on file    REVIEW OF SYSTEMS: Constitutional: Feels tired. ENT:  No nose bleeds Pulm: Dyspnea has improved during the course of hospitalization. CV:  No palpitations, no LE edema.  GU:  No hematuria, no frequency GI:   Per HPI Heme: No unusual or excessive bleeding or bruising.  No previous history of anemia. Transfusions: No previous transfusions of blood products. Neuro:  No headaches, no peripheral tingling or numbness Derm:  No itching, no rash  or sores.  Endocrine:  No sweats or chills.  No polyuria or dysuria Immunization: Did not ask about recent vaccinations. Travel:  None beyond local counties in last few months.    PHYSICAL EXAM: Vital signs in last 24 hours: Vitals:   10/11/17 0802 10/11/17 1214  BP: 105/62 (!) 105/59  Pulse:  78  Resp: 16 14  Temp: 97.7 F (36.5 C) 98.1 F (36.7 C)  SpO2: 93% 95%   Wt Readings from Last 3 Encounters:  10/11/17 120 lb 9.5 oz (54.7 kg)    General: Elderly, Hispanic female who looks mildly ill but is comfortable. Head: Facial asymmetry or swelling.  No signs of head trauma. Eyes: Scleral icterus, no conjunctival pallor. Ears: Slightly hard of hearing. Nose: No congestion or discharge. Mouth: Full dentures in place.  Oral mucosa moist, clear.  Tongue midline. Neck: No JVD, no masses, no thyromegaly. Lungs: Clear bilaterally though breath sounds greatly reduced throughout the lung fields.  No adventitious sounds.  No dyspnea and no cough. Heart: RRR.  No MRG.  S1, S2 present. Abdomen: Soft.  Tenderness across the upper abdomen but mostly in the epigastrium and right upper quadrant.  No guarding or rebound.  No masses, no hernias, no organomegaly.  No bruits..   Rectal: Deferred Musc/Skeltl: No joint redness, swelling or significant deformities. Extremities: No CCE. Neurologic: Sleepy but awake during the conversation and exam.  Moves all 4 limbs without tremors.  Limb strength not tested. Skin: No rashes, no sores.  No telangiectasia. Tattoos: None Nodes: No cervical adenopathy. Psych: Calm, pleasant, cooperative.  Intake/Output from previous day: 04/15 0701 - 04/16 0700 In: 0  Out: 1585 [Urine:1585] Intake/Output this shift: Total I/O In:  360 [P.O.:360] Out: 250 [Urine:250]  LAB RESULTS: Recent Labs    10/09/17 0645 10/10/17 1048 10/11/17 0742  WBC 8.3 7.1 6.8  HGB 8.7* 9.8* 9.6*  HCT 25.4* 29.6* 29.3*  PLT 420* 465* 449*   BMET Lab Results  Component Value Date   NA 130 (L) 10/11/2017   NA 129 (L) 10/10/2017   NA 124 (L) 10/09/2017   K 3.8 10/11/2017   K 4.1 10/10/2017   K 4.2 10/09/2017   CL 100 (L) 10/11/2017   CL 97 (L) 10/10/2017   CL 88 (L) 10/09/2017   CO2 19 (L) 10/11/2017   CO2 20 (L) 10/10/2017   CO2 18 (L) 10/09/2017   GLUCOSE 178 (H) 10/11/2017   GLUCOSE 162 (H) 10/10/2017   GLUCOSE 115 (H) 10/09/2017   BUN 36 (H) 10/11/2017   BUN 46 (H) 10/10/2017   BUN 48 (H) 10/09/2017   CREATININE 3.24 (H) 10/11/2017   CREATININE 3.84 (H) 10/10/2017   CREATININE 3.86 (H) 10/09/2017   CALCIUM 8.1 (L) 10/11/2017   CALCIUM 8.2 (L) 10/10/2017   CALCIUM 7.3 (L) 10/09/2017   LFT Recent Labs    10/10/17 1048  PROT 6.1*  ALBUMIN 2.7*  AST 28  ALT 22  ALKPHOS 105  BILITOT 0.5   PT/INR Lab Results  Component Value Date   INR 1.02 10/05/2017   Hepatitis Panel No results for input(s): HEPBSAG, HCVAB, HEPAIGM, HEPBIGM in the last 72 hours. C-Diff No components found for: CDIFF Lipase  No results found for: LIPASE  Drugs of Abuse  No results found for: LABOPIA, COCAINSCRNUR, LABBENZ, AMPHETMU, THCU, LABBARB   RADIOLOGY STUDIES: No results found.  ENDOSCOPIC STUDIES: None ever  IMPRESSION:   *    Epigastric/right upper quadrant pain with normal LFTs.  This could be  ulcer disease, esophagitis.  Although her LFTs are normal it may represent gallbladder disease. Given her recent MI, cardiac interventions, possible pericarditis and ongoing Brilinta prefer to avoid endoscopic testing if possible  *   Chest pain, completed MI by the time she arrived.  Status post PCI and DES placement.  On Brilinta and low-dose aspirin.  *    Congestive heart failure.  *    Normocytic anemia.  Stable.   Anemia panel not yet obtained. Even if she were to be heme positive, she has not a colonoscopy candidate.   PLAN:     *    Anemia panel in the morning.  FOBT testing.  *   Daily MiraLAX unless she develops diarrhea.  *    Abdominal ultrasound in the morning to rule out biliary pathology.  Jennye Moccasin  10/11/2017, 2:21 PM Phone [phone]

## 2017-10-12 ENCOUNTER — Inpatient Hospital Stay (HOSPITAL_COMMUNITY): Payer: Medicaid Other

## 2017-10-12 DIAGNOSIS — I255 Ischemic cardiomyopathy: Secondary | ICD-10-CM

## 2017-10-12 DIAGNOSIS — R0602 Shortness of breath: Secondary | ICD-10-CM

## 2017-10-12 DIAGNOSIS — R109 Unspecified abdominal pain: Secondary | ICD-10-CM | POA: Diagnosis not present

## 2017-10-12 DIAGNOSIS — D509 Iron deficiency anemia, unspecified: Secondary | ICD-10-CM

## 2017-10-12 DIAGNOSIS — I2109 ST elevation (STEMI) myocardial infarction involving other coronary artery of anterior wall: Secondary | ICD-10-CM

## 2017-10-12 DIAGNOSIS — R630 Anorexia: Secondary | ICD-10-CM

## 2017-10-12 LAB — GLUCOSE, CAPILLARY
GLUCOSE-CAPILLARY: 175 mg/dL — AB (ref 65–99)
GLUCOSE-CAPILLARY: 98 mg/dL (ref 65–99)
GLUCOSE-CAPILLARY: 118 mg/dL — AB (ref 65–99)
Glucose-Capillary: 152 mg/dL — ABNORMAL HIGH (ref 65–99)

## 2017-10-12 LAB — CBC
HEMATOCRIT: 26.9 % — AB (ref 36.0–46.0)
HEMOGLOBIN: 8.9 g/dL — AB (ref 12.0–15.0)
MCH: 27.9 pg (ref 26.0–34.0)
MCHC: 33.1 g/dL (ref 30.0–36.0)
MCV: 84.3 fL (ref 78.0–100.0)
Platelets: 410 10*3/uL — ABNORMAL HIGH (ref 150–400)
RBC: 3.19 MIL/uL — AB (ref 3.87–5.11)
RDW: 14.3 % (ref 11.5–15.5)
WBC: 7.2 10*3/uL (ref 4.0–10.5)

## 2017-10-12 LAB — BASIC METABOLIC PANEL
ANION GAP: 10 (ref 5–15)
BUN: 32 mg/dL — ABNORMAL HIGH (ref 6–20)
CHLORIDE: 102 mmol/L (ref 101–111)
CO2: 18 mmol/L — AB (ref 22–32)
Calcium: 8 mg/dL — ABNORMAL LOW (ref 8.9–10.3)
Creatinine, Ser: 2.8 mg/dL — ABNORMAL HIGH (ref 0.44–1.00)
GFR calc non Af Amer: 15 mL/min — ABNORMAL LOW (ref >60)
GFR, EST AFRICAN AMERICAN: 17 mL/min — AB (ref >60)
Glucose, Bld: 151 mg/dL — ABNORMAL HIGH (ref 65–99)
POTASSIUM: 3.7 mmol/L (ref 3.5–5.1)
Sodium: 130 mmol/L — ABNORMAL LOW (ref 135–145)

## 2017-10-12 MED ORDER — IOPAMIDOL (ISOVUE-300) INJECTION 61%
INTRAVENOUS | Status: AC
  Administered 2017-10-12: 30 mL via ORAL
  Filled 2017-10-12: qty 30

## 2017-10-12 MED ORDER — IOPAMIDOL (ISOVUE-300) INJECTION 61%: 30.0000 mL | Freq: Once | INTRAVENOUS | Status: DC | PRN

## 2017-10-12 MED ORDER — SUCRALFATE 1 GM/10ML PO SUSP
1.0000 g | Freq: Four times a day (QID) | ORAL | Status: DC
  Administered 2017-10-12 – 2017-10-24 (×42): 1 g via ORAL
  Filled 2017-10-12 (×44): qty 10

## 2017-10-12 MED ORDER — FUROSEMIDE 40 MG PO TABS
40.0000 mg | ORAL_TABLET | Freq: Every day | ORAL | Status: DC
  Administered 2017-10-13 – 2017-10-20 (×8): 40 mg via ORAL
  Filled 2017-10-12 (×8): qty 1

## 2017-10-12 NOTE — Plan of Care (Signed)

## 2017-10-12 NOTE — Evaluation (Signed)
Physical Therapy Evaluation Patient Details Name: Sheila White MRN: [id number] DOB: [birth year] Today's Date: 10/12/2017   History of Present Illness  Patient is a 77 y.o. F with significant PMH of diabetes mellitus who presented to ED with chest pain and found to have an acute anterior myocardial infarction. Treated with angioplasty and stent. Devleoped post infarction pericarditis and hyponatremia.   Clinical Impression  Patient is presenting with decreased functional mobility compared to baseline secondary to generalized weakness, decreased balance, and limited endurance. Patient independent with ADL's and ambulation at baseline, however, upon evaluation, requiring min assist for ambulation with RW up to 100 feet. Gait speed is 0.43 ft/s indicating patient is a limited household ambulator and a fall risk. Needs continuous physical assistance of maintaining RW along a straight path and with turning. Interestingly, patient states that the "TV looks blurry," and objects closer appear clear. Difficult to obtain accurate visual assessment with language barrier. Patient son states family is available 24/7 to help at home. Recommending HHPT and 24 hour assistance. Will benefit from skilled acute PT to increase mobility and facilitate safe discharge home.     Follow Up Recommendations Home health PT;Supervision/Assistance - 24 hour    Equipment Recommendations  Rolling walker with 5" wheels;3in1 (PT)    Recommendations for Other Services       Precautions / Restrictions Precautions Precautions: Fall Restrictions Weight Bearing Restrictions: No      Mobility  Bed Mobility Overal bed mobility: Modified Independent             General bed mobility comments: Increased time for supine to sit  Transfers Overall transfer level: Needs assistance Equipment used: Rolling walker (2 wheeled);None Transfers: Sit to/from Stand Sit to Stand: Supervision         General transfer comment: VC's  for hand placement and for turning RW fully prior to sitting  Ambulation/Gait Ambulation/Gait assistance: Min assist Ambulation Distance (Feet): 100 Feet Assistive device: Rolling walker (2 wheeled);None Gait Pattern/deviations: Step-through pattern;Decreased stride length;Narrow base of support;Shuffle Gait velocity: 0.43 ft/s Gait velocity interpretation: <1.31 ft/sec, indicative of household ambulator General Gait Details: Patient requiring up to min assist for maintaning RW on a straight path and with turns. Very slow. Ambulated additional 3 feet with no device; patient with increased unsteadiness and tending to furniture walk for additional support.  Stairs            Wheelchair Mobility    Modified Rankin (Stroke Patients Only)       Balance Overall balance assessment: Needs assistance Sitting-balance support: No upper extremity supported;Feet supported Sitting balance-Leahy Scale: Good     Standing balance support: No upper extremity supported;During functional activity Standing balance-Leahy Scale: Fair Standing balance comment: Needs additional assistance for dynamic balance                             Pertinent Vitals/Pain Pain Assessment: No/denies pain    Home Living Family/patient expects to be discharged to:: Private residence Living Arrangements: Other relatives(Sister) Available Help at Discharge: Family(Sons) Type of Home: House Home Access: Level entry     Home Layout: One level Home Equipment: None      Prior Function Level of Independence: Independent         Comments: Patient independent with ADL's and ambulation. Does not drive. Enjoys going to church.      Hand Dominance        Extremity/Trunk Assessment   Upper Extremity Assessment  Upper Extremity Assessment: Generalized weakness    Lower Extremity Assessment Lower Extremity Assessment: Generalized weakness    Cervical / Trunk Assessment Cervical / Trunk  Assessment: Normal  Communication   Communication: Prefers language other than English(Spanish speaking)  Cognition Arousal/Alertness: Lethargic Behavior During Therapy: WFL for tasks assessed/performed Overall Cognitive Status: Within Functional Limits for tasks assessed                                        General Comments General comments (skin integrity, edema, etc.): VSS. Patient states TV looks blurry. Patient son present throughout.    Exercises     Assessment/Plan    PT Assessment Patient needs continued PT services  PT Problem List Decreased strength;Decreased mobility;Decreased activity tolerance;Decreased balance;Decreased knowledge of use of DME       PT Treatment Interventions DME instruction;Gait training;Stair training;Functional mobility training;Therapeutic exercise;Therapeutic activities;Balance training;Patient/family education    PT Goals (Current goals can be found in the Care Plan section)  Acute Rehab PT Goals Patient Stated Goal: Be able to go to church PT Goal Formulation: With patient Time For Goal Achievement: 10/26/17 Potential to Achieve Goals: Good Additional Goals Additional Goal #1: Patient will improve gait speed by 0.2 ft/s to decrease fall risk    Frequency Min 3X/week   Barriers to discharge        Co-evaluation               AM-PAC PT "6 Clicks" Daily Activity  Outcome Measure Difficulty turning over in bed (including adjusting bedclothes, sheets and blankets)?: A Little Difficulty moving from lying on back to sitting on the side of the bed? : A Little Difficulty sitting down on and standing up from a chair with arms (e.g., wheelchair, bedside commode, etc,.)?: A Little Help needed moving to and from a bed to chair (including a wheelchair)?: A Little Help needed walking in hospital room?: A Little Help needed climbing 3-5 steps with a railing? : A Lot 6 Click Score: 17    End of Session Equipment Utilized  During Treatment: Gait belt Activity Tolerance: Patient tolerated treatment well Patient left: in chair;with call bell/phone within reach;with family/visitor present Nurse Communication: Mobility status PT Visit Diagnosis: Unsteadiness on feet (R26.81);Muscle weakness (generalized) (M62.81);Difficulty in walking, not elsewhere classified (R26.2)    Time: [id number] PT Time Calculation (min) (ACUTE ONLY): 27 min   Charges:   PT Evaluation $PT Eval Moderate Complexity: 1 Mod PT Treatments $Gait Training: 8-22 mins   PT G Codes:       Laurina Bustlearoline Ryelan Kazee, PT, DPT Acute Rehabilitation Services  Pager: [phone]   Vanetta MuldersCarloine H Ramone Gander 10/12/2017, 9:45 AM

## 2017-10-12 NOTE — Progress Notes (Signed)
After patient returned from CT scan patient received bath and placed in chair. Patient visibly more comfortable, asleep in chair patient and family state pain in abdomen is better now. Dr. Chales AbrahamsGupta on call and stated patient may have clears and NPO after mid night

## 2017-10-12 NOTE — Progress Notes (Signed)
Progress Note   Subjective  Patient continues to have some upper abdominal pain, reportedly post prandial, often after eating medications. Hard to localize. RUQ US done showing distended GB but no overt cholecystitis.   Objective   Vital signs in last 24 hours: Temp:  [97.6 F (36.4 C)-98.5 F (36.9 C)] 98.2 F (36.8 C) (04/17 0519) Pulse Rate:  [78-98] 86 (04/17 0858) Resp:  [14-22] 22 (04/17 0858) BP: (105-129)/(59-80) 109/62 (04/17 0858) SpO2:  [91 %-97 %] 97 % (04/17 0858) Weight:  [119 lb 8 oz (54.2 kg)] 119 lb 8 oz (54.2 kg) (04/17 0519) Last BM Date: 10/11/17 General:    hispanic female in NAD Heart:  Regular rate and rhythm; no murmurs Lungs: Respirations even and unlabored,  Abdomen:  Soft, some mild upper abdominal TTP, normal bowel sounds. Extremities:  Without edema. Neurologic:  Alert and oriented,  grossly normal neurologically. Psych:  Cooperative. Normal mood and affect.  Intake/Output from previous day: 04/16 0701 - 04/17 0700 In: 480 [P.O.:480] Out: 850 [Urine:850] Intake/Output this shift: No intake/output data recorded.  Lab Results: Recent Labs    10/10/17 1048 10/11/17 0742 10/12/17 0309  WBC 7.1 6.8 7.2  HGB 9.8* 9.6* 8.9*  HCT 29.6* 29.3* 26.9*  PLT 465* 449* 410*   BMET Recent Labs    10/10/17 1048 10/11/17 0742 10/12/17 0309  NA 129* 130* 130*  K 4.1 3.8 3.7  CL 97* 100* 102  CO2 20* 19* 18*  GLUCOSE 162* 178* 151*  BUN 46* 36* 32*  CREATININE 3.84* 3.24* 2.80*  CALCIUM 8.2* 8.1* 8.0*   LFT Recent Labs    10/10/17 1048  PROT 6.1*  ALBUMIN 2.7*  AST 28  ALT 22  ALKPHOS 105  BILITOT 0.5   PT/INR No results for input(s): LABPROT, INR in the last 72 hours.  Studies/Results: [place] Abdomen Limited  Result Date: 10/11/2017 CLINICAL DATA:  Right upper quadrant pain EXAM: ULTRASOUND ABDOMEN LIMITED RIGHT UPPER QUADRANT COMPARISON:  None. FINDINGS: Gallbladder: Gallbladder is distended. No gallstones or wall thickening  visualized. No pericholecystic fluid. No sonographic Murphy sign noted by sonographer. Common bile duct: Diameter: 5 mm. No intrahepatic or extrahepatic biliary duct dilatation. Liver: No focal lesion identified. Within normal limits in parenchymal echogenicity. Portal vein is patent on color Doppler imaging with normal direction of blood flow towards the liver. There is a right pleural effusion. IMPRESSION: Gallbladder distended but otherwise appears unremarkable. Significance of this finding is uncertain. Given this appearance, it may be prudent to consider nuclear medicine hepatobiliary imaging study to assess for cystic duct patency. Right pleural effusion. Study otherwise unremarkable. Electronically Signed   By: Bretta BangWilliam  Woodruff III M.D.   On: 10/11/2017 21:15       Assessment / Plan:   77 y/o female recovering from MI s/p PCI with DES placement to the LAD, with ongoing upper abdominal pain and loss of appetite. Also noted to have microcytic anemia present upon admission.   Initial US shows a distended gallbladder but no overt cholecystitis or gallstones. LFTs normal. She seems to have some generalized upper abdominal pain to palpation. I'm recommending a CT scan of the abdomen / pelvis today, will hold off on IV contrast given her renal function. Will also empirically add carafate on top of BID PPI to empirically cover PUD in case she has this. Can consider EGD pending her course, would hope to avoid invasive procedures given her recent MI if possible. May also consider HIDA scan pending  results. Awaiting iron levels. Regarding question of side effects of medication, Brilinta can cause nausea in a small percentage of patients, but has not been reported to cause abdominal pain.   Call with questions, will follow.  Ileene Patrick, MD [facility] Gastroenterology

## 2017-10-12 NOTE — Progress Notes (Signed)
Progress Note  Patient Name: Sheila White Date of Encounter: 10/12/2017  Primary Cardiologist: No primary care provider on file.  Subjective   No chest pain this morning. Does c/o diffuse abd pain this morning but no nausea/vomiting. Related with physical therapy and was noted that she needed assistance.  Inpatient Medications    Scheduled Meds: . aspirin  81 mg Oral Daily  . atorvastatin  80 mg Oral q1800  . colchicine  0.6 mg Oral Daily  . docusate sodium  100 mg Oral Daily  . insulin aspart  0-5 Units Subcutaneous QHS  . insulin aspart  0-9 Units Subcutaneous TID WC  . metoprolol tartrate  12.5 mg Oral BID  . pantoprazole  40 mg Oral BID  . polyethylene glycol  17 g Oral Daily  . sodium chloride flush  3 mL Intravenous Q12H  . ticagrelor  90 mg Oral BID   Continuous Infusions: . sodium chloride     PRN Meds: sodium chloride, acetaminophen, diazepam, morphine injection, nitroGLYCERIN, ondansetron (ZOFRAN) IV, pneumococcal 23 valent vaccine, sodium chloride flush, zolpidem   Vital Signs    Vitals:   10/12/17 0029 10/12/17 0519 10/12/17 0847 10/12/17 0858  BP: 128/80 (!) 113/59  109/62  Pulse: 95 81 82 86  Resp: (!) 22 17  (!) 22  Temp: 98.5 F (36.9 C) 98.2 F (36.8 C)    TempSrc: Oral Oral    SpO2: 91% 94%  97%  Weight:  119 lb 8 oz (54.2 kg)    Height:        Intake/Output Summary (Last 24 hours) at 10/12/2017 1027 Last data filed at 10/12/2017 0035 Gross per 24 hour  Intake 120 ml  Output 850 ml  Net -730 ml   Filed Weights   10/10/17 2301 10/11/17 0427 10/12/17 0519  Weight: 120 lb 9.5 oz (54.7 kg) 120 lb 9.5 oz (54.7 kg) 119 lb 8 oz (54.2 kg)    Telemetry    SR - Personally Reviewed  ECG    N/a - Personally Reviewed  Physical Exam   General: Older hispanic female appearing in no acute distress. Head: Normocephalic, atraumatic.  Neck: Supple, no JVD. Lungs:  Resp regular and unlabored, CTA. Heart: RRR, S1, S2, no murmur. Abdomen: Soft,  moderately tender to palpation, non-distended with normoactive bowel sounds.  Extremities: No clubbing, cyanosis, edema. Distal pedal pulses are 2+ bilaterally. Neuro: Alert and oriented X 3. Moves all extremities spontaneously. Psych: Normal affect.  Labs    Chemistry Recent Labs  Lab 10/05/17 2035 10/06/17 0256  10/10/17 1048 10/11/17 0742 10/12/17 0309  NA 133* 116*   < > 129* 130* 130*  K 3.2* 4.7   < > 4.1 3.8 3.7  CL 102 83*   < > 97* 100* 102  CO2 11* 20*   < > 20* 19* 18*  GLUCOSE 216* 157*   < > 162* 178* 151*  BUN 21* 29*   < > 46* 36* 32*  CREATININE 0.77 1.27*   < > 3.84* 3.24* 2.80*  CALCIUM 5.3* 8.2*   < > 8.2* 8.1* 8.0*  PROT 4.3* 6.2*  --  6.1*  --   --   ALBUMIN 2.0* 2.9*  --  2.7*  --   --   AST 15 25  --  28  --   --   ALT 13* 18  --  22  --   --   ALKPHOS 47 67  --  105  --   --  BILITOT 0.3 0.4  --  0.5  --   --   GFRNONAA >60 39*   < > 10* 13* 15*  GFRAA >60 45*   < > 12* 15* 17*  ANIONGAP 20* 13   < > 12 11 10    < > = values in this interval not displayed.     Hematology Recent Labs  Lab 10/10/17 1048 10/11/17 0742 10/12/17 0309  WBC 7.1 6.8 7.2  RBC 3.56* 3.51* 3.19*  HGB 9.8* 9.6* 8.9*  HCT 29.6* 29.3* 26.9*  MCV 83.1 83.5 84.3  MCH 27.5 27.4 27.9  MCHC 33.1 32.8 33.1  RDW 14.1 14.1 14.3  PLT 465* 449* 410*    Cardiac Enzymes Recent Labs  Lab 10/05/17 1754 10/05/17 2035 10/06/17 0256  TROPONINI 1.46* 1.20* 1.58*   No results for input(s): TROPIPOC in the last 168 hours.   BNP Recent Labs  Lab 10/05/17 2035 10/06/17 1037  BNP 3,798.7* 2,455.9*     DDimer No results for input(s): DDIMER in the last 168 hours.    Radiology    US Abdomen Limited  Result Date: 10/11/2017 CLINICAL DATA:  Right upper quadrant pain EXAM: ULTRASOUND ABDOMEN LIMITED RIGHT UPPER QUADRANT COMPARISON:  None. FINDINGS: Gallbladder: Gallbladder is distended. No gallstones or wall thickening visualized. No pericholecystic fluid. No sonographic  Murphy sign noted by sonographer. Common bile duct: Diameter: 5 mm. No intrahepatic or extrahepatic biliary duct dilatation. Liver: No focal lesion identified. Within normal limits in parenchymal echogenicity. Portal vein is patent on color Doppler imaging with normal direction of blood flow towards the liver. There is a right pleural effusion. IMPRESSION: Gallbladder distended but otherwise appears unremarkable. Significance of this finding is uncertain. Given this appearance, it may be prudent to consider nuclear medicine hepatobiliary imaging study to assess for cystic duct patency. Right pleural effusion. Study otherwise unremarkable. Electronically Signed   By: Bretta Bang III M.D.   On: 10/11/2017 21:15    Cardiac Studies   Cath: 10/05/17  Conclusion     Mid LAD lesion is 90% stenosed.  Prox LAD lesion is 99% stenosed.  Dist LAD lesion is 99% stenosed.  1st Mrg lesion is 95% stenosed.  Ost 2nd Mrg to 2nd Mrg lesion is 30% stenosed.  Ost RPDA to RPDA lesion is 30% stenosed.  Post intervention, there is a 0% residual stenosis.  Post intervention, there is a 0% residual stenosis.  Post intervention, there is a 35% residual stenosis.  A stent was successfully placed.  A stent was successfully placed.   Acute late presentation anterior ST segment elevation myocardial infarction with demonstration of subtotal long proximal LAD stenosis, 90% mid stenosis, and 99% apical stenosis with reduced apical flow.  Left circumflex disease with 50% proximal OM1 stenosis followed by distal 95% marginal stenosis prior to giving off distal branches in the OM1 vessel.  Mild diffuse irregularity of a large dominant RCA with 30% narrowing in the PDA vessel.  LVEDP 34 mmHg/  Successful PCI to the LAD with ultimate insertion of a 2.526 mm Resolute DES stent postdilated to 2.8 mm proximally and 2.68 mm distally with the 99% stenosis being reduced to 0%; DES stenting of the mid 90%  stenosis with ultimate insertion of a 2.2512 mm Resolute DES stent with residual narrowing at 0%, and initial 99% apical thrombotic stenosis with reduced flow treated with low-level PTCA and intracoronary verapamil with improvement to approximately 35-40% in a very small apical LAD segment.  RECOMMENDATION: DAPT for minimum of 1  year.  Diuresis with elevated EDP.  An echo Doppler study will be obtained in a.m.  Patient should be started on low-dose ACE inhibition, carvedilol, probable nitrate therapy, and high potency statin treatment.   TTE: 10/06/17  Study Conclusions  - Left ventricle: The cavity size was normal. There was mild focal   basal hypertrophy of the septum. Mid to apical   inferoseptal/anteroseptal akinesis, apical lateral akinesis, mid   to apical anterior akinesis, apical inferior akinesis, akinesis   of the true apex. The estimated ejection fraction was 30%.   Features are consistent with a pseudonormal left ventricular   filling pattern, with concomitant abnormal relaxation and   increased filling pressure (grade 2 diastolic dysfunction). - Aortic valve: Trileaflet; moderately calcified leaflets.   Sclerosis without stenosis. There was trivial regurgitation. - Mitral valve: Mildly calcified annulus. There was trivial   regurgitation. - Right ventricle: The cavity size was normal. Systolic function   was normal. - Pulmonary arteries: No complete TR doppler jet so unable to   estimate PA systolic pressure. - Inferior vena cava: The vessel was normal in size. The   respirophasic diameter changes were in the normal range (= 50%),   consistent with normal central venous pressure. - Pericardium, extracardiac: A trivial pericardial effusion was   identified.  Impressions:  - Normal LV size with mild focal basal septal hypertrophy. EF 30%   with wall motion abnormalities as noted above in LAD   distribution. Normal RV size and systolic function. Aortic valve    sclerosis without significant stenosis.  Patient Profile     77 y.o. female with history of diabetes mellitus who presented with acute anterior myocardial infarction, starting 24-36 hours before presentation. LAD stenting performed. LAD flow was decreased postprocedure and there was evidence of microcirculatory obstruction. TIMI blush score 0.Severe hyponatremia of uncertain duration, not responding to diuresis for heart failure management.Worsening renal function for which nephrology is following.  Assessment & Plan    1. Late presenting MI: Had PCI/DES to the p/mLAD with ballooning to distal segment. Placed on DAPT with ASA/Brilinta. Struggled with chest pain post MI felt to be related to pericarditis. Given decadron with improvement. No further reports of chest pain. On statin, and low dose BB.   2. Severe hyponatremia: Nephrology following. Was placed on hypertonic saline.. Na has increased to 130  3. ICM: EF 30% on echo. Did struggle with oliguria post cath, but now seems to be diuresing well. No signs of overload on exam. I/O -5L  4. Acute renal failure: Cr continues to rise, at 3.86---> 3.24 --> 2.8 Possible CN? Nephrology following.   5. Anemia: Hgb has been around 9.5, down to 8.7---> 9.6--> 8.9 stable . No signs of overt bleeding.  -- follow CBC  6. Abd pain: still complaining of abdominal pain about 20 minutes after taking her medications. We did add Colace which resulted in a bowel movement however she continues to have abdominal pain. Abdominal ultrasound showed a slightly large gallbladder with sludge but no overt cholecystitis. The GI service has consulted and recommended twice a day PPI , Carafate and abdominal CT. They are following.  GI workup continues to progress. She is stable cardiovascularly. Her renal function continues to improve as as does her serum sodium. Her hemoglobin has remained stable. We are anticipating discharge home in the next several days with home  health care.  Signed, Nanetta BattyJonathan Kambree Krauss, MD  10/12/2017, 10:27 AM  Pager # [phone]   For questions or updates,  please contact [place] Please consult [email or web address] for contact info under Cardiology/STEMI.

## 2017-10-12 NOTE — Care Management Note (Signed)
Case Management Note Previous CM note completed by Cherylann Parrlaxton, Samantha S, RN 10/07/2017, 10:14 AM    Patient Details  Name: Sheila White MRN: [id number] Date of Birth: 07/16/1940  Subjective/Objective:       Pt admitted with MI - post infarct pericard            Action/Plan:  PTA independent from home with daughter - [place] recently moved to [place] and [place] not have insurance.  CM [facility]  CM spoke with pt/daughter and son at bedside (daughter interpreted).  CM explained the PCP appt and that pt will also need to go to clinic on 4/18 and apply for orange card - all information provided on AVS.  Pt will need to be MATCH at discharge.  CM discussed potential concern with NP Lillia AbedLindsay that pt may have gap in medication coverage [facility] and medication assistance being provided by [employer] - NP will follow pt closer to discharge for contingency plan for medications    Additional CM follow up notes:  10/12/17- 1030- Sheila Charlot RN, CM-  Per PT eval pt wound benefit from HHPT and DME needs- RW and 3n1-  Per son family will be available 24/7 to assist pt at home- pt will need orders for HHPT and DME prior to discharge- once placed will have [facility] assess pt to see if pt will qualify for charity care- DME can be delivered to room prior to discharge. CM will continue to follow for mediation needs- MATCH letter to be provided prior to discharge. CM called [facility] to have orange card appointment changed from 4/18 to 5/16- clinic to call pt/family with time for appointment post discharge.   Expected Discharge Date:                  Expected Discharge Plan:  Home w Home Health Services  In-House Referral:  NA  Discharge planning Services  CM Consult  Post Acute Care Choice:  Durable Medical Equipment, Home Health Choice offered to:  Adult Children  DME Arranged:  [place] rolling DME Agency:  [facility]  HH Arranged:  PT HH Agency:   [facility]  Status of Service:  In process, will continue to follow  If discussed at Long Length of Stay Meetings, dates discussed:    Discharge Disposition:   Additional Comments:  Sheila White, Sheila White Hall, RN 10/12/2017, 10:30 AM [phone] 4E Transition Care Coordinator

## 2017-10-12 NOTE — Progress Notes (Signed)
Cardiology MD [facility] call [facility] and made aware that ABD CT results in Epic. Oncoming RN made aware will monitor patient Sheila White, Randall AnKristin Jessup RN

## 2017-10-12 NOTE — Progress Notes (Signed)
Patient ID: Sheila BarryMaria Decruz, female   DOB: 07/16/1940, 77 y.o.   MRN: [id number] [facility] Progress Note   Assessment/ Plan:    1.  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  She required 3% NS 4/14 which resulted in a good response- water diuresis.  Her Na is now 130 off 3% and also Lasix and she appears to be autodiuresing.  Restarting Lasix for tomorrow AM at 40 mg daily.   2.  Acute kidney injury: Suspected to be hemodynamically mediated from CHF exacerbation/renal hypoperfusion-possibly also with mild injury from contrast exposure.  This is improving.  3.  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  On colchicine.  4.  Acute systolic heart failure: LVEF 30%.  5.  RUQ/ abd pain: GI following, RUQ US with distended GB, getting CT abd/ pelvis with PO contrast today.  Hope this isn't from the pericarditis but would be atypical given treatment with colchicine.  4.  Dispo: Pending   Subjective:    Has epigastric and RUQ pain that is persistent.  Getting morphine this afternoon.  Going for CT    Objective:   BP (!) 118/59 (BP Location: Right Arm)   Pulse 75   Temp 98.2 F (36.8 C) (Oral)   Resp 18   Ht 5' (1.524 m)   Wt 54.2 kg (119 lb 8 oz)   SpO2 97%   BMI 23.34 kg/m   Intake/Output Summary (Last 24 hours) at 10/12/2017 1526 Last data filed at 10/12/2017 0035 Gross per 24 hour  Intake 0 ml  Output 600 ml  Net -600 ml   Weight change: -0.495 kg (-1 lb 1.5 oz)  Physical Exam: Gen: Appears to be comfortable resting in bed, family at bedside.   HEENT: JVP improved CVS: Pulse regular rhythm, normal rate, S1 and S2 normal without any rub Resp: Anterior chest clear to auscultation without rales, no rhonchi Abd: Soft, flat, nontender Ext: No  lower extremity edema  Imaging: [place] Abdomen Limited  Result Date: 10/11/2017 CLINICAL DATA:  Right upper quadrant pain EXAM: ULTRASOUND ABDOMEN LIMITED RIGHT UPPER QUADRANT COMPARISON:  None. FINDINGS: Gallbladder: Gallbladder is distended. No gallstones or wall thickening visualized. No pericholecystic fluid. No sonographic Murphy sign noted by sonographer. Common bile duct: Diameter: 5 mm. No intrahepatic or extrahepatic biliary duct dilatation. Liver: No focal lesion identified. Within normal limits in parenchymal echogenicity. Portal vein is patent on color Doppler imaging with normal direction of blood flow towards the liver. There is a right pleural effusion. IMPRESSION: Gallbladder distended but otherwise appears unremarkable. Significance of this finding is uncertain. Given this appearance, it may be prudent to consider nuclear medicine hepatobiliary imaging study to assess for cystic duct patency. Right pleural effusion. Study otherwise unremarkable. Electronically Signed   By: Bretta BangWilliam  Woodruff III M.D.   On: 10/11/2017 21:15    Labs: BMET Recent Labs  Lab 10/07/17 1824  10/08/17 0702 10/08/17 1619  10/09/17 0645 10/09/17 0917 10/09/17 1304 10/09/17 1616 10/09/17 1921 10/10/17 1048 10/11/17 0742 10/12/17 0309  NA 118*   < > 114* 113*   < > 119* 121* 124* 123* 124* 129* 130* 130*  K 4.5  --  4.8 4.6  --  4.2  --   --   --   --  4.1 3.8 3.7  CL 83*  --  82* 81*  --  88*  --   --   --   --  97* 100* 102  CO2 22  --  19* 20*  --  18*  --   --   --   --  20* 19* 18*  GLUCOSE 111*  --  127* 146*  --  115*  --   --   --   --  162* 178* 151*  BUN 39*  --  44* 47*  --  48*  --   --   --   --  46* 36* 32*  CREATININE 2.57*  --  3.20* 3.62*  --  3.86*  --   --   --   --  3.84* 3.24* 2.80*  CALCIUM 8.1*  --  7.6* 7.5*  --  7.3*  --   --   --   --  8.2* 8.1* 8.0*   < > = values in this interval not displayed.   CBC Recent Labs  Lab 10/05/17 2035  10/09/17 0645 10/10/17 1048  10/11/17 0742 10/12/17 0309  WBC 9.4   < > 8.3 7.1 6.8 7.2  NEUTROABS 7.4  --   --   --   --   --   HGB 9.6*   < > 8.7* 9.8* 9.6* 8.9*  HCT 27.6*   < > 25.4* 29.6* 29.3* 26.9*  MCV 79.5   < > 81.7 83.1 83.5 84.3  PLT 493*   < > 420* 465* 449* 410*   < > = values in this interval not displayed.    Medications:    . aspirin  81 mg Oral Daily  . atorvastatin  80 mg Oral q1800  . colchicine  0.6 mg Oral Daily  . docusate sodium  100 mg Oral Daily  . insulin aspart  0-5 Units Subcutaneous QHS  . insulin aspart  0-9 Units Subcutaneous TID WC  . metoprolol tartrate  12.5 mg Oral BID  . pantoprazole  40 mg Oral BID  . polyethylene glycol  17 g Oral Daily  . sodium chloride flush  3 mL Intravenous Q12H  . sucralfate  1 g Oral Q6H  . ticagrelor  90 mg Oral BID   Bufford Buttner MD [facility] pgr [phone] 10/12/2017, 3:26 PM

## 2017-10-12 NOTE — Progress Notes (Addendum)
Patient visibly uncomfortable in pain, moaning and rocking back and forth, and also with complaints of pain in abdomen. Pain medication given as ordered as needed for pain. Evalena Fujii, Randall AnKristin Jessup RN

## 2017-10-13 DIAGNOSIS — I214 Non-ST elevation (NSTEMI) myocardial infarction: Secondary | ICD-10-CM

## 2017-10-13 DIAGNOSIS — R1010 Upper abdominal pain, unspecified: Secondary | ICD-10-CM

## 2017-10-13 LAB — BASIC METABOLIC PANEL
ANION GAP: 9 (ref 5–15)
BUN: 25 mg/dL — ABNORMAL HIGH (ref 6–20)
CO2: 21 mmol/L — ABNORMAL LOW (ref 22–32)
Calcium: 8.2 mg/dL — ABNORMAL LOW (ref 8.9–10.3)
Chloride: 98 mmol/L — ABNORMAL LOW (ref 101–111)
Creatinine, Ser: 2.45 mg/dL — ABNORMAL HIGH (ref 0.44–1.00)
GFR, EST AFRICAN AMERICAN: 20 mL/min — AB (ref >60)
GFR, EST NON AFRICAN AMERICAN: 18 mL/min — AB (ref >60)
GLUCOSE: 123 mg/dL — AB (ref 65–99)
POTASSIUM: 4 mmol/L (ref 3.5–5.1)
SODIUM: 128 mmol/L — AB (ref 135–145)

## 2017-10-13 LAB — CBC
HCT: 28.6 % — ABNORMAL LOW (ref 36.0–46.0)
Hemoglobin: 9.3 g/dL — ABNORMAL LOW (ref 12.0–15.0)
MCH: 27.4 pg (ref 26.0–34.0)
MCHC: 32.5 g/dL (ref 30.0–36.0)
MCV: 84.1 fL (ref 78.0–100.0)
Platelets: 395 10*3/uL (ref 150–400)
RBC: 3.4 MIL/uL — AB (ref 3.87–5.11)
RDW: 14 % (ref 11.5–15.5)
WBC: 6.7 10*3/uL (ref 4.0–10.5)

## 2017-10-13 LAB — IRON AND TIBC
IRON: 22 ug/dL — AB (ref 28–170)
SATURATION RATIOS: 8 % — AB (ref 10.4–31.8)
TIBC: 273 ug/dL (ref 250–450)
UIBC: 251 ug/dL

## 2017-10-13 LAB — GLUCOSE, CAPILLARY
GLUCOSE-CAPILLARY: 236 mg/dL — AB (ref 65–99)
Glucose-Capillary: 121 mg/dL — ABNORMAL HIGH (ref 65–99)
Glucose-Capillary: 202 mg/dL — ABNORMAL HIGH (ref 65–99)
Glucose-Capillary: 113 mg/dL — ABNORMAL HIGH (ref 65–99)

## 2017-10-13 LAB — FERRITIN: FERRITIN: 137 ng/mL (ref 11–307)

## 2017-10-13 NOTE — Progress Notes (Deleted)
CVP zeroed and measured = 20

## 2017-10-13 NOTE — Progress Notes (Signed)
CARDIAC REHAB PHASE I   Came to ambulate/access pt. She c/o significant abdominal pain today, starting from lower abdomen doing to upper abdomen. Daughter sts this is different than her previous abdominal pain and that this pain is similar to her admit pain. Pt is very uncomfortable in the bed, moving around due to the pain. Will hold ambulation. Notified RN. BP 127/71, HR 70s, seemingly ST still elevated (has been). [id number] Harriet MassonRandi Kristan Elspeth Blucher CES, ACSM 10/13/2017 11:58 AM

## 2017-10-13 NOTE — Progress Notes (Signed)
Progress Note  Patient Name: Sheila White Date of Encounter: 10/13/2017  Primary Cardiologist: No primary care provider on file.  Subjective   No chest pain this morning.POD #9 LAD stenting in setting of Ant STEMI with delayed presentation. Her abdominal pain is somewhat improved this morning. Her abdominal CT scan did not show any abnormalities however it did show large bilateral pleural effusions.  Inpatient Medications    Scheduled Meds: . aspirin  81 mg Oral Daily  . atorvastatin  80 mg Oral q1800  . colchicine  0.6 mg Oral Daily  . docusate sodium  100 mg Oral Daily  . furosemide  40 mg Oral Daily  . insulin aspart  0-5 Units Subcutaneous QHS  . insulin aspart  0-9 Units Subcutaneous TID WC  . metoprolol tartrate  12.5 mg Oral BID  . pantoprazole  40 mg Oral BID  . polyethylene glycol  17 g Oral Daily  . sodium chloride flush  3 mL Intravenous Q12H  . sucralfate  1 g Oral Q6H  . ticagrelor  90 mg Oral BID   Continuous Infusions: . sodium chloride     PRN Meds: sodium chloride, acetaminophen, diazepam, iopamidol, morphine injection, nitroGLYCERIN, ondansetron (ZOFRAN) IV, pneumococcal 23 valent vaccine, sodium chloride flush, zolpidem   Vital Signs    Vitals:   10/12/17 2053 10/13/17 0002 10/13/17 0356 10/13/17 0838  BP: 132/63 114/66 (!) 148/74 (!) 142/81  Pulse: 79 75  88  Resp: 16 20 15 13   Temp: (!) 97.4 F (36.3 C) 97.6 F (36.4 C) (!) 97.4 F (36.3 C) (!) 97.4 F (36.3 C)  TempSrc: Oral Oral Oral Oral  SpO2: 98% 99% 98% 95%  Weight:   120 lb 1.6 oz (54.5 kg)   Height:        Intake/Output Summary (Last 24 hours) at 10/13/2017 0924 Last data filed at 10/13/2017 0357 Gross per 24 hour  Intake -  Output 600 ml  Net -600 ml   Filed Weights   10/11/17 0427 10/12/17 0519 10/13/17 0356  Weight: 120 lb 9.5 oz (54.7 kg) 119 lb 8 oz (54.2 kg) 120 lb 1.6 oz (54.5 kg)    Telemetry    SR - Personally Reviewed  ECG    N/a - Personally  Reviewed  Physical Exam   General: Older hispanic female appearing in no acute distress. Head: Normocephalic, atraumatic.  Neck: Supple, no JVD. Lungs:  Resp regular and unlabored, CTA. Heart: RRR, S1, S2, no murmur. Abdomen: Soft, moderately tender to palpation, non-distended with normoactive bowel sounds.  Extremities: No clubbing, cyanosis, edema. Distal pedal pulses are 2+ bilaterally. Neuro: Alert and oriented X 3. Moves all extremities spontaneously. Psych: Normal affect.  Labs    Chemistry Recent Labs  Lab 10/10/17 1048 10/11/17 0742 10/12/17 0309 10/13/17 0313  NA 129* 130* 130* 128*  K 4.1 3.8 3.7 4.0  CL 97* 100* 102 98*  CO2 20* 19* 18* 21*  GLUCOSE 162* 178* 151* 123*  BUN 46* 36* 32* 25*  CREATININE 3.84* 3.24* 2.80* 2.45*  CALCIUM 8.2* 8.1* 8.0* 8.2*  PROT 6.1*  --   --   --   ALBUMIN 2.7*  --   --   --   AST 28  --   --   --   ALT 22  --   --   --   ALKPHOS 105  --   --   --   BILITOT 0.5  --   --   --  GFRNONAA 10* 13* 15* 18*  GFRAA 12* 15* 17* 20*  ANIONGAP 12 11 10 9      Hematology Recent Labs  Lab 10/11/17 0742 10/12/17 0309 10/13/17 0313  WBC 6.8 7.2 6.7  RBC 3.51* 3.19* 3.40*  HGB 9.6* 8.9* 9.3*  HCT 29.3* 26.9* 28.6*  MCV 83.5 84.3 84.1  MCH 27.4 27.9 27.4  MCHC 32.8 33.1 32.5  RDW 14.1 14.3 14.0  PLT 449* 410* 395    Cardiac Enzymes No results for input(s): TROPONINI in the last 168 hours. No results for input(s): TROPIPOC in the last 168 hours.   BNP Recent Labs  Lab 10/06/17 1037  BNP 2,455.9*     DDimer No results for input(s): DDIMER in the last 168 hours.    Radiology    Ct Abdomen Pelvis Wo Contrast  Result Date: 10/12/2017 CLINICAL DATA:  Abdominal distension.  Upper abdominal pain. EXAM: CT ABDOMEN AND PELVIS WITHOUT CONTRAST TECHNIQUE: Multidetector CT imaging of the abdomen and pelvis was performed following the standard protocol without IV contrast. COMPARISON:  Ultrasound 10/11/2017 FINDINGS: Lower chest:  Lung bases are clear. Hepatobiliary: No focal hepatic lesion. No biliary duct dilatation. Gallbladder is mildly distended but within normal limits. Probable sludge within the fundus (sagittal image 68) no pericholecystic fluid inflammation identified on noncontrast exam. Common bile duct normal caliber. Pancreas: Pancreas is normal. No ductal dilatation. No pancreatic inflammation. Spleen: Normal spleen Adrenals/urinary tract: Adrenal glands and kidneys are normal. The RIGHT kidney smaller than LEFT. No obstructive uropathy. Gas within the bladder. Foley catheter within the bladder. Stomach/Bowel: Stomach, small bowel, appendix, and cecum are normal. The colon and rectosigmoid colon are normal. Vascular/Lymphatic: Abdominal aorta is normal caliber with atherosclerotic calcification. There is no retroperitoneal or periportal lymphadenopathy. No pelvic lymphadenopathy. Reproductive: Uterus normal for age Other: No free fluid. Musculoskeletal: No aggressive osseous lesion. IMPRESSION: 1. Large bilateral pleural effusions with dense bibasilar passive atelectasis. 2. No acute findings in abdomen pelvis. 3. Gallbladder sludge and mild distension. 4.  Aortic Atherosclerosis (ICD10-I70.0). 5. Foley catheter in bladder. Electronically Signed   By: Genevive Bi M.D.   On: 10/12/2017 16:52   US Abdomen Limited  Result Date: 10/11/2017 CLINICAL DATA:  Right upper quadrant pain EXAM: ULTRASOUND ABDOMEN LIMITED RIGHT UPPER QUADRANT COMPARISON:  None. FINDINGS: Gallbladder: Gallbladder is distended. No gallstones or wall thickening visualized. No pericholecystic fluid. No sonographic Murphy sign noted by sonographer. Common bile duct: Diameter: 5 mm. No intrahepatic or extrahepatic biliary duct dilatation. Liver: No focal lesion identified. Within normal limits in parenchymal echogenicity. Portal vein is patent on color Doppler imaging with normal direction of blood flow towards the liver. There is a right pleural  effusion. IMPRESSION: Gallbladder distended but otherwise appears unremarkable. Significance of this finding is uncertain. Given this appearance, it may be prudent to consider nuclear medicine hepatobiliary imaging study to assess for cystic duct patency. Right pleural effusion. Study otherwise unremarkable. Electronically Signed   By: Bretta Bang III M.D.   On: 10/11/2017 21:15    Cardiac Studies   Cath: 10/05/17  Conclusion     Mid LAD lesion is 90% stenosed.  Prox LAD lesion is 99% stenosed.  Dist LAD lesion is 99% stenosed.  1st Mrg lesion is 95% stenosed.  Ost 2nd Mrg to 2nd Mrg lesion is 30% stenosed.  Ost RPDA to RPDA lesion is 30% stenosed.  Post intervention, there is a 0% residual stenosis.  Post intervention, there is a 0% residual stenosis.  Post intervention, there is a  35% residual stenosis.  A stent was successfully placed.  A stent was successfully placed.   Acute late presentation anterior ST segment elevation myocardial infarction with demonstration of subtotal long proximal LAD stenosis, 90% mid stenosis, and 99% apical stenosis with reduced apical flow.  Left circumflex disease with 50% proximal OM1 stenosis followed by distal 95% marginal stenosis prior to giving off distal branches in the OM1 vessel.  Mild diffuse irregularity of a large dominant RCA with 30% narrowing in the PDA vessel.  LVEDP 34 mmHg/  Successful PCI to the LAD with ultimate insertion of a 2.526 mm Resolute DES stent postdilated to 2.8 mm proximally and 2.68 mm distally with the 99% stenosis being reduced to 0%; DES stenting of the mid 90% stenosis with ultimate insertion of a 2.2512 mm Resolute DES stent with residual narrowing at 0%, and initial 99% apical thrombotic stenosis with reduced flow treated with low-level PTCA and intracoronary verapamil with improvement to approximately 35-40% in a very small apical LAD segment.  RECOMMENDATION: DAPT for minimum of 1 year.   Diuresis with elevated EDP.  An echo Doppler study will be obtained in a.m.  Patient should be started on low-dose ACE inhibition, carvedilol, probable nitrate therapy, and high potency statin treatment.   TTE: 10/06/17  Study Conclusions  - Left ventricle: The cavity size was normal. There was mild focal   basal hypertrophy of the septum. Mid to apical   inferoseptal/anteroseptal akinesis, apical lateral akinesis, mid   to apical anterior akinesis, apical inferior akinesis, akinesis   of the true apex. The estimated ejection fraction was 30%.   Features are consistent with a pseudonormal left ventricular   filling pattern, with concomitant abnormal relaxation and   increased filling pressure (grade 2 diastolic dysfunction). - Aortic valve: Trileaflet; moderately calcified leaflets.   Sclerosis without stenosis. There was trivial regurgitation. - Mitral valve: Mildly calcified annulus. There was trivial   regurgitation. - Right ventricle: The cavity size was normal. Systolic function   was normal. - Pulmonary arteries: No complete TR doppler jet so unable to   estimate PA systolic pressure. - Inferior vena cava: The vessel was normal in size. The   respirophasic diameter changes were in the normal range (= 50%),   consistent with normal central venous pressure. - Pericardium, extracardiac: A trivial pericardial effusion was   identified.  Impressions:  - Normal LV size with mild focal basal septal hypertrophy. EF 30%   with wall motion abnormalities as noted above in LAD   distribution. Normal RV size and systolic function. Aortic valve   sclerosis without significant stenosis.  Patient Profile     77 y.o. female with history of diabetes mellitus who presented with acute anterior myocardial infarction, starting 24-36 hours before presentation. LAD stenting performed. LAD flow was decreased postprocedure and there was evidence of microcirculatory obstruction. TIMI blush score  0.Severe hyponatremia of uncertain duration, not responding to diuresis for heart failure management.Worsening renal function for which nephrology is following.  Assessment & Plan    1. Late presenting MI: Had PCI/DES to the p/mLAD with ballooning to distal segment. Placed on DAPT with ASA/Brilinta. Struggled with chest pain post MI felt to be related to pericarditis. Given decadron with improvement. No further reports of chest pain. On statin, and low dose BB. I wonder whether some of her symptoms are related to [place]. If she has continued shortness of breath after taking Brilenta we can consider transitioning her to Plavix prior to discharge.  2.  Severe hyponatremia: Nephrology following. Was placed on hypertonic saline.. Na has increased to 130--->128  3. ICM: EF 30% on echo. Did struggle with oliguria post cath, but now seems to be diuresing well. No signs of overload on exam other than significantly diminished breath sounds at the bases consistent with the abdominal CT findings of bilateral pleural effusions.. I/O -5.5L  4. Acute renal failure: Cr continues to rise, at 3.86---> 3.24 --> 2.8---> 2.45 Possible CN? Nephrology following. Given her cannulation of pleural fluid in the absence of Lasix the renal service has suggested restarting oral diuretics which I agree with. We will continue to follow her serum creatinine and sodium levels.  5. Anemia: Hgb has been around 9.5, down to 8.7---> 9.6--> 8.9 --> 9.3 stable . No signs of overt bleeding.  -- follow CBC  6. Abd pain: her abdominal pain has significantly improved. Abdominal ultrasound showed a slightly large gallbladder with sludge but no overt cholecystitis. The GI service has consulted and recommended twice a day PPI , Carafate and abdominal CT which was performed yesterday that did not show any significant abnormalities other than bilateral pleural effusions.. They are following. Given her clinical improvement with the addition of  twice a day PPI and Carafate she probably will not require endoscopy today.  GI workup continues to progress. She is stable cardiovascularly. Her renal function continues to improve as as does her serum sodium. Her hemoglobin has remained stable. We are anticipating discharge home in the next several days with home health care. We may consider transitioning from Brilenta to Plavix if she has continued symptoms of shortness of breath after taking the Brilenta.  Signed, Nanetta BattyJonathan Berry, MD  10/13/2017, 9:24 AM  Pager # [phone]   For questions or updates, please contact [facility] Please consult [email or web address] for contact info under Cardiology/STEMI.

## 2017-10-13 NOTE — Progress Notes (Signed)
Patient ID: Sheila White, female   DOB: 07/16/1940, 77 y.o.   MRN: [id number] [facility] Progress Note   Assessment/ Plan:    1.  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  She required 3% NS 4/14 which resulted in a good response- water diuresis.  Her Na is now 130 off 3% and also Lasix and she appears to be autodiuresing.  Restarting Lasix 40 mg daily.   2.  Acute kidney injury: Suspected to be hemodynamically mediated from CHF exacerbation/renal hypoperfusion-possibly also with mild injury from contrast exposure.  This is improving.  3.  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  On colchicine.  Her pain isn't really getting better from what dtr and pt tell me today--> not sure if from ongoing pericarditis (but being treated) or something else  4.  Acute systolic heart failure: LVEF 30%.  5.  RUQ/ abd pain: GI following, RUQ US with distended GB, CT abd/ pelvis with PO contrast showing bilateral pleural effusions but no acute abdominal abnormality.  4.  Dispo: Pending   Subjective:    Significant epigastric pain today.     Objective:   BP 121/79 (BP Location: Right Arm)   Pulse 77   Temp 98.2 F (36.8 C) (Oral)   Resp 16   Ht 5' (1.524 m)   Wt 54.5 kg (120 lb 1.6 oz)   SpO2 95%   BMI 23.46 kg/m   Intake/Output Summary (Last 24 hours) at 10/13/2017 1221 Last data filed at 10/13/2017 0835 Gross per 24 hour  Intake 125 ml  Output 600 ml  Net -475 ml   Weight change: 0.272 kg (9.6 oz)  Physical Exam: Gen: Moaning in pain HEENT: JVP improved CVS: Pulse regular rhythm, normal rate, S1 and S2 normal without any rub Resp: Anterior chest clear to auscultation without rales, no rhonchi, diminished at bases Abd: Soft, flat, nontender Ext:  No lower extremity edema  Imaging: Ct Abdomen Pelvis Wo Contrast  Result Date: 10/12/2017 CLINICAL DATA:  Abdominal distension.  Upper abdominal pain. EXAM: CT ABDOMEN AND PELVIS WITHOUT CONTRAST TECHNIQUE: Multidetector CT imaging of the abdomen and pelvis was performed following the standard protocol without IV contrast. COMPARISON:  Ultrasound 10/11/2017 FINDINGS: Lower chest: Lung bases are clear. Hepatobiliary: No focal hepatic lesion. No biliary duct dilatation. Gallbladder is mildly distended but within normal limits. Probable sludge within the fundus (sagittal image 68) no pericholecystic fluid inflammation identified on noncontrast exam. Common bile duct normal caliber. Pancreas: Pancreas is normal. No ductal dilatation. No pancreatic inflammation. Spleen: Normal spleen Adrenals/urinary tract: Adrenal glands and kidneys are normal. The RIGHT kidney smaller than LEFT. No obstructive uropathy. Gas within the bladder. Foley catheter within the bladder. Stomach/Bowel: Stomach, small bowel, appendix, and cecum are normal. The colon and rectosigmoid colon are normal. Vascular/Lymphatic: Abdominal aorta is normal caliber with atherosclerotic calcification. There is no retroperitoneal or periportal lymphadenopathy. No pelvic lymphadenopathy. Reproductive: Uterus normal for age Other: No free fluid. Musculoskeletal: No aggressive osseous lesion. IMPRESSION: 1. Large bilateral pleural effusions with dense bibasilar passive atelectasis. 2. No acute findings in abdomen pelvis. 3. Gallbladder sludge and mild distension. 4.  Aortic Atherosclerosis (ICD10-I70.0). 5. Foley catheter in bladder. Electronically Signed   By: Genevive BiStewart  Edmunds M.D.   On: 10/12/2017 16:52   [place] Abdomen Limited  Result Date: 10/11/2017 CLINICAL DATA:  Right upper quadrant pain EXAM: ULTRASOUND ABDOMEN LIMITED RIGHT UPPER QUADRANT COMPARISON:  None. FINDINGS: Gallbladder: Gallbladder is distended. No gallstones or wall thickening  visualized. No pericholecystic fluid. No sonographic Murphy sign noted by sonographer. Common bile duct: Diameter: 5 mm. No intrahepatic or extrahepatic biliary duct dilatation. Liver: No focal lesion identified. Within normal limits in parenchymal echogenicity. Portal vein is patent on color Doppler imaging with normal direction of blood flow towards the liver. There is a right pleural effusion. IMPRESSION: Gallbladder distended but otherwise appears unremarkable. Significance of this finding is uncertain. Given this appearance, it may be prudent to consider nuclear medicine hepatobiliary imaging study to assess for cystic duct patency. Right pleural effusion. Study otherwise unremarkable. Electronically Signed   By: Bretta Bang III M.D.   On: 10/11/2017 21:15    Labs: BMET Recent Labs  Lab 10/08/17 0702 10/08/17 1619  10/09/17 0645  10/09/17 1304 10/09/17 1616 10/09/17 1921 10/10/17 1048 10/11/17 0742 10/12/17 0309 10/13/17 0313  NA 114* 113*   < > 119*   < > 124* 123* 124* 129* 130* 130* 128*  K 4.8 4.6  --  4.2  --   --   --   --  4.1 3.8 3.7 4.0  CL 82* 81*  --  88*  --   --   --   --  97* 100* 102 98*  CO2 19* 20*  --  18*  --   --   --   --  20* 19* 18* 21*  GLUCOSE 127* 146*  --  115*  --   --   --   --  162* 178* 151* 123*  BUN 44* 47*  --  48*  --   --   --   --  46* 36* 32* 25*  CREATININE 3.20* 3.62*  --  3.86*  --   --   --   --  3.84* 3.24* 2.80* 2.45*  CALCIUM 7.6* 7.5*  --  7.3*  --   --   --   --  8.2* 8.1* 8.0* 8.2*   < > = values in this interval not displayed.   CBC Recent Labs  Lab 10/10/17 1048 10/11/17 0742 10/12/17 0309 10/13/17 0313  WBC 7.1 6.8 7.2 6.7  HGB 9.8* 9.6* 8.9* 9.3*  HCT 29.6* 29.3* 26.9* 28.6*  MCV 83.1 83.5 84.3 84.1  PLT 465* 449* 410* 395    Medications:    . aspirin  81 mg Oral Daily  . atorvastatin  80 mg Oral q1800  . colchicine  0.6 mg Oral Daily  . docusate sodium  100 mg Oral Daily  . furosemide  40 mg Oral Daily  .  insulin aspart  0-5 Units Subcutaneous QHS  . insulin aspart  0-9 Units Subcutaneous TID WC  . metoprolol tartrate  12.5 mg Oral BID  . pantoprazole  40 mg Oral BID  . polyethylene glycol  17 g Oral Daily  . sodium chloride flush  3 mL Intravenous Q12H  . sucralfate  1 g Oral Q6H  . ticagrelor  90 mg Oral BID   Bufford Buttner MD [facility] pgr [phone] 10/13/2017, 12:21 PM

## 2017-10-13 NOTE — Progress Notes (Signed)
Daily Rounding Note  10/13/2017, 8:54 AM  LOS: 8 days   SUBJECTIVE:   Chief complaint: upper abd pain, improved.  Last incidence relieved with Morphine at 2340 and no pain since.  No BMs for a couple of days.    OBJECTIVE:         Vital signs in last 24 hours:    Temp:  [97.4 F (36.3 C)-97.6 F (36.4 C)] 97.4 F (36.3 C) (04/18 0838) Pulse Rate:  [75-88] 88 (04/18 0838) Resp:  [11-22] 13 (04/18 0838) BP: (108-148)/(59-81) 142/81 (04/18 0838) SpO2:  [95 %-99 %] 95 % (04/18 0838) Weight:  [120 lb 1.6 oz (54.5 kg)] 120 lb 1.6 oz (54.5 kg) (04/18 0356) Last BM Date: 10/11/17 Filed Weights   10/11/17 0427 10/12/17 0519 10/13/17 0356  Weight: 120 lb 9.5 oz (54.7 kg) 119 lb 8 oz (54.2 kg) 120 lb 1.6 oz (54.5 kg)   General: frail, alert.  Looks unwell but not acutely ill   Heart: RRR Chest: some crackles and reduced BS in bases.   Abdomen: soft, NT, ND.  Active BS  Extremities: no CCE Neuro/Psych:  Oriented x 3.  No gross deficits.  Follows all commands.    Intake/Output from previous day: 04/17 0701 - 04/18 0700 In: -  Out: 600 [Urine:600]  Intake/Output this shift: No intake/output data recorded.  Lab Results: Recent Labs    10/11/17 0742 10/12/17 0309 10/13/17 0313  WBC 6.8 7.2 6.7  HGB 9.6* 8.9* 9.3*  HCT 29.3* 26.9* 28.6*  PLT 449* 410* 395   BMET Recent Labs    10/11/17 0742 10/12/17 0309 10/13/17 0313  NA 130* 130* 128*  K 3.8 3.7 4.0  CL 100* 102 98*  CO2 19* 18* 21*  GLUCOSE 178* 151* 123*  BUN 36* 32* 25*  CREATININE 3.24* 2.80* 2.45*  CALCIUM 8.1* 8.0* 8.2*   LFT Recent Labs    10/10/17 1048  PROT 6.1*  ALBUMIN 2.7*  AST 28  ALT 22  ALKPHOS 105  BILITOT 0.5   PT/INR No results for input(s): LABPROT, INR in the last 72 hours. Hepatitis Panel No results for input(s): HEPBSAG, HCVAB, HEPAIGM, HEPBIGM in the last 72 hours.  Studies/Results: Ct Abdomen Pelvis Wo  Contrast  Result Date: 10/12/2017 CLINICAL DATA:  Abdominal distension.  Upper abdominal pain. EXAM: CT ABDOMEN AND PELVIS WITHOUT CONTRAST TECHNIQUE: Multidetector CT imaging of the abdomen and pelvis was performed following the standard protocol without IV contrast. COMPARISON:  Ultrasound 10/11/2017 FINDINGS: Lower chest: Lung bases are clear. Hepatobiliary: No focal hepatic lesion. No biliary duct dilatation. Gallbladder is mildly distended but within normal limits. Probable sludge within the fundus (sagittal image 68) no pericholecystic fluid inflammation identified on noncontrast exam. Common bile duct normal caliber. Pancreas: Pancreas is normal. No ductal dilatation. No pancreatic inflammation. Spleen: Normal spleen Adrenals/urinary tract: Adrenal glands and kidneys are normal. The RIGHT kidney smaller than LEFT. No obstructive uropathy. Gas within the bladder. Foley catheter within the bladder. Stomach/Bowel: Stomach, small bowel, appendix, and cecum are normal. The colon and rectosigmoid colon are normal. Vascular/Lymphatic: Abdominal aorta is normal caliber with atherosclerotic calcification. There is no retroperitoneal or periportal lymphadenopathy. No pelvic lymphadenopathy. Reproductive: Uterus normal for age Other: No free fluid. Musculoskeletal: No aggressive osseous lesion. IMPRESSION: 1. Large bilateral pleural effusions with dense bibasilar passive atelectasis. 2. No acute findings in abdomen pelvis. 3. Gallbladder sludge and mild distension. 4.  Aortic Atherosclerosis (ICD10-I70.0). 5. Foley catheter in  bladder. Electronically Signed   By: Genevive BiStewart  Edmunds M.D.   On: 10/12/2017 16:52   [place] Abdomen Limited  Result Date: 10/11/2017 CLINICAL DATA:  Right upper quadrant pain EXAM: ULTRASOUND ABDOMEN LIMITED RIGHT UPPER QUADRANT COMPARISON:  None. FINDINGS: Gallbladder: Gallbladder is distended. No gallstones or wall thickening visualized. No pericholecystic fluid. No sonographic Murphy sign  noted by sonographer. Common bile duct: Diameter: 5 mm. No intrahepatic or extrahepatic biliary duct dilatation. Liver: No focal lesion identified. Within normal limits in parenchymal echogenicity. Portal vein is patent on color Doppler imaging with normal direction of blood flow towards the liver. There is a right pleural effusion. IMPRESSION: Gallbladder distended but otherwise appears unremarkable. Significance of this finding is uncertain. Given this appearance, it may be prudent to consider nuclear medicine hepatobiliary imaging study to assess for cystic duct patency. Right pleural effusion. Study otherwise unremarkable. Electronically Signed   By: Bretta BangWilliam  Woodruff III M.D.   On: 10/11/2017 21:15   Scheduled Meds: . aspirin  81 mg Oral Daily  . atorvastatin  80 mg Oral q1800  . colchicine  0.6 mg Oral Daily  . docusate sodium  100 mg Oral Daily  . furosemide  40 mg Oral Daily  . insulin aspart  0-5 Units Subcutaneous QHS  . insulin aspart  0-9 Units Subcutaneous TID WC  . metoprolol tartrate  12.5 mg Oral BID  . pantoprazole  40 mg Oral BID  . polyethylene glycol  17 g Oral Daily  . sodium chloride flush  3 mL Intravenous Q12H  . sucralfate  1 g Oral Q6H  . ticagrelor  90 mg Oral BID   Continuous Infusions: . sodium chloride     PRN Meds:.sodium chloride, acetaminophen, diazepam, iopamidol, morphine injection, nitroGLYCERIN, ondansetron (ZOFRAN) IV, pneumococcal 23 valent vaccine, sodium chloride flush, zolpidem   ASSESMENT:   *  Upper abdominal pain, still occurring intermittently.  Not triggerred by clear PO.   BID Protonix, Carafate in place.  Pain overall improved.     Non-contrast CT: GB sludge and mild distension.  Large bil pleural effusions Distended GB on ultrasound.   Normal LFTs Could the pericarditis be the source of pain? Though location is not typical, it is improved with leaning forward.    *  MI.  S/p PCI and DES.  Ticagrelor in place.  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 Colchicine in place, may be the cause of nausea.    *   Normocytic anemia.  Low iron, low iron sat, ferritin 137.  No B12 or folate assay.  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    *  Large, bil pleural effusions.    *  Hyponatremia.    *  AKI.  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    PLAN   *   B12 and Folate in AM.    *  ? Pursue EGD and/or HIDA scan.  Since she is improving, may want to hold up on these.  Allowed her clears this AM.    *  Dr Allyson SabalBerry is starting Lasix today for pleural effusions.      Sheila MoccasinSarah Magali White  10/13/2017, 8:54 AM Phone [phone]

## 2017-10-13 NOTE — Progress Notes (Signed)
Physical Therapy Treatment Patient Details Name: Sheila White MRN: [id number] DOB: 22-Sep-1940 Today's Date: 10/13/2017    History of Present Illness Patient is a 77 y.o. F with significant PMH of diabetes mellitus who presented to ED with chest pain and found to have an acute anterior myocardial infarction. Treated with angioplasty and stent. Devleoped post infarction pericarditis and hyponatremia.     PT Comments    Treatment limited by patient abdominal pain, however patient was willing to mobilize out of bed to [facility] rather than use bed pan. Patient continues to require min assist for transfers and ambulation using RW. Poor safety awareness with RW maintaining proximity; may potentially benefit from trialing another AD for future ambulation I.e. SPC if able to maintain balance.    Follow Up Recommendations  Home health PT;Supervision/Assistance - 24 hour     Equipment Recommendations  Rolling walker with 5" wheels;3in1 (PT)    Recommendations for Other Services       Precautions / Restrictions Precautions Precautions: Fall Restrictions Weight Bearing Restrictions: No    Mobility  Bed Mobility Overal bed mobility: Modified Independent             General bed mobility comments: Increased time for bed mobility with HOB significantly elevated  Transfers Overall transfer level: Needs assistance Equipment used: Rolling walker (2 wheeled);None Transfers: Sit to/from Stand Sit to Stand: Min guard         General transfer comment: Needs frequent VC's for hand placement.   Ambulation/Gait Ambulation/Gait assistance: Min assist Ambulation Distance (Feet): 3 Feet Assistive device: Rolling walker (2 wheeled);None Gait Pattern/deviations: Decreased stride length;Narrow base of support;Shuffle     General Gait Details: Patient with min assist for ambulation with RW from bed to [facility]. Needs max verbal cueing for safety including RW proximity and turning completely with RW  before attempting to sit down on BSC.    Stairs             Wheelchair Mobility    Modified Rankin (Stroke Patients Only)       Balance Overall balance assessment: Needs assistance Sitting-balance support: No upper extremity supported;Feet supported Sitting balance-Leahy Scale: Good     Standing balance support: No upper extremity supported;During functional activity Standing balance-Leahy Scale: Fair Standing balance comment: Needs additional assistance for dynamic balance                            Cognition Arousal/Alertness: Lethargic Behavior During Therapy: WFL for tasks assessed/performed Overall Cognitive Status: Within Functional Limits for tasks assessed                                        Exercises      General Comments General comments (skin integrity, edema, etc.): Patient daughter and son in the room      Pertinent Vitals/Pain Pain Assessment: Faces Faces Pain Scale: Hurts little more Pain Location: Abdomen Pain Descriptors / Indicators: Discomfort Pain Intervention(s): Limited activity within patient's tolerance;Premedicated before session    Home Living                      Prior Function            PT Goals (current goals can now be found in the care plan section) Acute Rehab PT Goals Patient Stated Goal: Be able to go to church  PT Goal Formulation: With patient Time For Goal Achievement: 10/26/17 Potential to Achieve Goals: Good Progress towards PT goals: Progressing toward goals    Frequency    Min 3X/week      PT Plan Current plan remains appropriate    Co-evaluation              AM-PAC PT "6 Clicks" Daily Activity  Outcome Measure  Difficulty turning over in bed (including adjusting bedclothes, sheets and blankets)?: A Little Difficulty moving from lying on back to sitting on the side of the bed? : A Little Difficulty sitting down on and standing up from a chair with arms  (e.g., wheelchair, bedside commode, etc,.)?: A Little Help needed moving to and from a bed to chair (including a wheelchair)?: A Little Help needed walking in hospital room?: A Little Help needed climbing 3-5 steps with a railing? : A Lot 6 Click Score: 17    End of Session Equipment Utilized During Treatment: Gait belt Activity Tolerance: Patient limited by pain Patient left: in chair;with call bell/phone within reach;with family/visitor present Nurse Communication: Mobility status PT Visit Diagnosis: Unsteadiness on feet (R26.81);Muscle weakness (generalized) (M62.81);Difficulty in walking, not elsewhere classified (R26.2)     Time: [id number] PT Time Calculation (min) (ACUTE ONLY): 14 min  Charges:  $Therapeutic Activity: 8-22 mins                    G Codes:       Laurina Bustlearoline Travon Crochet, PT, DPT Acute Rehabilitation Services  Pager: [phone]    Vanetta MuldersCarloine H Sheriden Archibeque 10/13/2017, 2:49 PM

## 2017-10-13 NOTE — Progress Notes (Signed)
PT Cancellation Note  Patient Details Name: Sheila BarryMaria Decruz MRN: [id number] DOB: 07/16/1940   Cancelled Treatment:    Reason Eval/Treat Not Completed: Pain limiting ability to participate. Patient continues with severe abdominal/epigastric pain despite morphine prior to visit. RN aware and paged MD.   Laurina Bustlearoline Vasco Chong, PT, DPT Acute Rehabilitation Services  Pager: [phone]    Vanetta MuldersCarloine H Houston Zapien 10/13/2017, 1:32 PM

## 2017-10-14 ENCOUNTER — Inpatient Hospital Stay (HOSPITAL_COMMUNITY): Payer: Medicaid Other

## 2017-10-14 DIAGNOSIS — R0600 Dyspnea, unspecified: Secondary | ICD-10-CM

## 2017-10-14 DIAGNOSIS — I2102 ST elevation (STEMI) myocardial infarction involving left anterior descending coronary artery: Principal | ICD-10-CM

## 2017-10-14 LAB — GLUCOSE, CAPILLARY
GLUCOSE-CAPILLARY: 150 mg/dL — AB (ref 65–99)
Glucose-Capillary: 253 mg/dL — ABNORMAL HIGH (ref 65–99)
Glucose-Capillary: 158 mg/dL — ABNORMAL HIGH (ref 65–99)

## 2017-10-14 LAB — CBC WITH DIFFERENTIAL/PLATELET
BASOS ABS: 0 10*3/uL (ref 0.0–0.1)
Basophils Relative: 1 %
EOS PCT: 4 %
Eosinophils Absolute: 0.2 10*3/uL (ref 0.0–0.7)
HEMATOCRIT: 31.3 % — AB (ref 36.0–46.0)
Hemoglobin: 10.4 g/dL — ABNORMAL LOW (ref 12.0–15.0)
LYMPHS ABS: 1.9 10*3/uL (ref 0.7–4.0)
LYMPHS PCT: 28 %
MCH: 27.5 pg (ref 26.0–34.0)
MCHC: 33.2 g/dL (ref 30.0–36.0)
MCV: 82.8 fL (ref 78.0–100.0)
MONO ABS: 0.4 10*3/uL (ref 0.1–1.0)
MONOS PCT: 5 %
NEUTROS ABS: 4 10*3/uL (ref 1.7–7.7)
Neutrophils Relative %: 62 %
Platelets: 427 10*3/uL — ABNORMAL HIGH (ref 150–400)
RBC: 3.78 MIL/uL — ABNORMAL LOW (ref 3.87–5.11)
RDW: 14.4 % (ref 11.5–15.5)
WBC: 6.5 10*3/uL (ref 4.0–10.5)

## 2017-10-14 LAB — HEPATIC FUNCTION PANEL
ALT: 17 U/L (ref 14–54)
AST: 25 U/L (ref 15–41)
Albumin: 2.7 g/dL — ABNORMAL LOW (ref 3.5–5.0)
Alkaline Phosphatase: 88 U/L (ref 38–126)
BILIRUBIN DIRECT: 0.1 mg/dL (ref 0.1–0.5)
BILIRUBIN TOTAL: 0.6 mg/dL (ref 0.3–1.2)
Indirect Bilirubin: 0.5 mg/dL (ref 0.3–0.9)
Total Protein: 6 g/dL — ABNORMAL LOW (ref 6.5–8.1)

## 2017-10-14 LAB — BASIC METABOLIC PANEL
ANION GAP: 12 (ref 5–15)
BUN: 24 mg/dL — AB (ref 6–20)
CALCIUM: 8.4 mg/dL — AB (ref 8.9–10.3)
CO2: 22 mmol/L (ref 22–32)
Chloride: 98 mmol/L — ABNORMAL LOW (ref 101–111)
Creatinine, Ser: 2.3 mg/dL — ABNORMAL HIGH (ref 0.44–1.00)
GFR calc Af Amer: 22 mL/min — ABNORMAL LOW (ref >60)
GFR calc non Af Amer: 19 mL/min — ABNORMAL LOW (ref >60)
GLUCOSE: 125 mg/dL — AB (ref 65–99)
Potassium: 3.7 mmol/L (ref 3.5–5.1)
Sodium: 132 mmol/L — ABNORMAL LOW (ref 135–145)

## 2017-10-14 LAB — BRAIN NATRIURETIC PEPTIDE: B Natriuretic Peptide: 2856.8 pg/mL — ABNORMAL HIGH (ref 0.0–100.0)

## 2017-10-14 LAB — TROPONIN I: Troponin I: 0.42 ng/mL (ref <0.03)

## 2017-10-14 LAB — LIPASE, BLOOD: LIPASE: 36 U/L (ref 11–51)

## 2017-10-14 LAB — VITAMIN B12: Vitamin B-12: >7500 pg/mL — ABNORMAL HIGH (ref 180–914)

## 2017-10-14 MED ORDER — TECHNETIUM TC 99M MEBROFENIN IV KIT
5.0000 | PACK | Freq: Once | INTRAVENOUS | Status: AC | PRN
Start: 2017-10-14 — End: 2017-10-14
  Administered 2017-10-14: 5 via INTRAVENOUS

## 2017-10-14 NOTE — Progress Notes (Signed)
Daily Rounding Note  10/14/2017, 9:38 AM  LOS: 9 days   SUBJECTIVE:   Chief complaint:  Abdominal pain worse overnight and this AM.  Better right now No chest pain.   LFTs still normal.   GI meds include Carafate.   Protonix BID.    OBJECTIVE:         Vital signs in last 24 hours:    Temp:  [97.6 F (36.4 C)-98.3 F (36.8 C)] 98.1 F (36.7 C) (04/19 0928) Pulse Rate:  [77-80] 79 (04/19 0928) Resp:  [13-20] 20 (04/19 0928) BP: (100-149)/(64-79) 146/71 (04/19 0928) SpO2:  [96 %-100 %] 97 % (04/19 0928) Weight:  [117 lb 11.6 oz (53.4 kg)] 117 lb 11.6 oz (53.4 kg) (04/19 0520) Last BM Date: 10/11/17 Filed Weights   10/12/17 0519 10/13/17 0356 10/14/17 0520  Weight: 119 lb 8 oz (54.2 kg) 120 lb 1.6 oz (54.5 kg) 117 lb 11.6 oz (53.4 kg)   General: uncomfortable, unwell looking.  Tired.     Heart: RRR Chest: reduced BS on right base.  No adventitious sounds.  No labored breathing or cough Abdomen: soft, minor upper mid tenderness.  No guard or rebound.  BS hypoactive but normal quality. Extremities: no CCE Neuro/Psych:  Alert, tired, appropriate, oriented x 3.    Intake/Output from previous day: 04/18 0701 - 04/19 0700 In: 250 [P.O.:250] Out: 2100 [Urine:2100]  Intake/Output this shift: No intake/output data recorded.  Lab Results: Recent Labs    10/12/17 0309 10/13/17 0313  WBC 7.2 6.7  HGB 8.9* 9.3*  HCT 26.9* 28.6*  PLT 410* 395   BMET Recent Labs    10/12/17 0309 10/13/17 0313 10/14/17 0228  NA 130* 128* 132*  K 3.7 4.0 3.7  CL 102 98* 98*  CO2 18* 21* 22  GLUCOSE 151* 123* 125*  BUN 32* 25* 24*  CREATININE 2.80* 2.45* 2.30*  CALCIUM 8.0* 8.2* 8.4*   LFT Recent Labs    10/14/17 0228  PROT 6.0*  ALBUMIN 2.7*  AST 25  ALT 17  ALKPHOS 88  BILITOT 0.6  BILIDIR 0.1  IBILI 0.5   PT/INR No results for input(s): LABPROT, INR in the last 72 hours. Hepatitis Panel No results for  input(s): HEPBSAG, HCVAB, HEPAIGM, HEPBIGM in the last 72 hours.  Studies/Results: Ct Abdomen Pelvis Wo Contrast  Result Date: 10/12/2017 CLINICAL DATA:  Abdominal distension.  Upper abdominal pain. EXAM: CT ABDOMEN AND PELVIS WITHOUT CONTRAST TECHNIQUE: Multidetector CT imaging of the abdomen and pelvis was performed following the standard protocol without IV contrast. COMPARISON:  Ultrasound 10/11/2017 FINDINGS: Lower chest: Lung bases are clear. Hepatobiliary: No focal hepatic lesion. No biliary duct dilatation. Gallbladder is mildly distended but within normal limits. Probable sludge within the fundus (sagittal image 68) no pericholecystic fluid inflammation identified on noncontrast exam. Common bile duct normal caliber. Pancreas: Pancreas is normal. No ductal dilatation. No pancreatic inflammation. Spleen: Normal spleen Adrenals/urinary tract: Adrenal glands and kidneys are normal. The RIGHT kidney smaller than LEFT. No obstructive uropathy. Gas within the bladder. Foley catheter within the bladder. Stomach/Bowel: Stomach, small bowel, appendix, and cecum are normal. The colon and rectosigmoid colon are normal. Vascular/Lymphatic: Abdominal aorta is normal caliber with atherosclerotic calcification. There is no retroperitoneal or periportal lymphadenopathy. No pelvic lymphadenopathy. Reproductive: Uterus normal for age Other: No free fluid. Musculoskeletal: No aggressive osseous lesion. IMPRESSION: 1. Large bilateral pleural effusions with dense bibasilar passive atelectasis. 2. No acute findings in abdomen pelvis. 3.  Gallbladder sludge and mild distension. 4.  Aortic Atherosclerosis (ICD10-I70.0). 5. Foley catheter in bladder. Electronically Signed   By: Genevive BiStewart  Edmunds M.D.   On: 10/12/2017 16:52   Scheduled Meds: . aspirin  81 mg Oral Daily  . atorvastatin  80 mg Oral q1800  . colchicine  0.6 mg Oral Daily  . docusate sodium  100 mg Oral Daily  . furosemide  40 mg Oral Daily  . insulin aspart   0-5 Units Subcutaneous QHS  . insulin aspart  0-9 Units Subcutaneous TID WC  . metoprolol tartrate  12.5 mg Oral BID  . pantoprazole  40 mg Oral BID  . polyethylene glycol  17 g Oral Daily  . sodium chloride flush  3 mL Intravenous Q12H  . sucralfate  1 g Oral Q6H  . ticagrelor  90 mg Oral BID   Continuous Infusions: . sodium chloride     PRN Meds:.sodium chloride, acetaminophen, diazepam, iopamidol, morphine injection, nitroGLYCERIN, ondansetron (ZOFRAN) IV, pneumococcal 23 valent vaccine, sodium chloride flush, zolpidem   ASSESMENT:   *  Upper abdominal pain, persists and worse this AM.  Not triggerred by clear PO.   BID Protonix, Carafate in place.       Non-contrast CT: GB sludge and mild distension.  Large bil pleural effusions.   Distended GB on ultrasound.   Normal LFTs, Lipase on repeated assays.   ? Occult GB dz or PUD.   Could the pericarditis be the source of pain? Though location is not typical, it is improved with leaning forward.    *  MI.  S/p PCI and DES.  Ticagrelor in place.  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 Colchicine in place.   *   Normocytic anemia.  Low iron, low iron sat, ferritin 137.  No B12 or folate assay.  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    *  Large, bil pleural effusions.    *  Hyponatremia.  Improved.     *  AKI.  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      PLAN   *  HIDA scan ordered, if negative: EGD.  Trying to avoid sedation in pt after complicated MI/stenting on Shea EvansBrilinta    Azarah Dacy  10/14/2017, 9:38 AM Phone [phone]

## 2017-10-14 NOTE — Progress Notes (Signed)
Progress Note  Patient Name: Rudi Knippenberg Date of Encounter: 10/14/2017  Primary Cardiologist: No primary care provider on file.  Subjective   No chest pain this morning.POD #10LAD stenting in setting of Ant STEMI with delayed presentation. Her abdominal pain is actually worse this morning.  Her abdominal CT scan did not show any abnormalities however it did show large bilateral pleural effusions. She is scheduled for a hepato- biliary scan this morning. Denies CP  Inpatient Medications    Scheduled Meds: . aspirin  81 mg Oral Daily  . atorvastatin  80 mg Oral q1800  . colchicine  0.6 mg Oral Daily  . docusate sodium  100 mg Oral Daily  . furosemide  40 mg Oral Daily  . insulin aspart  0-5 Units Subcutaneous QHS  . insulin aspart  0-9 Units Subcutaneous TID WC  . metoprolol tartrate  12.5 mg Oral BID  . pantoprazole  40 mg Oral BID  . polyethylene glycol  17 g Oral Daily  . sodium chloride flush  3 mL Intravenous Q12H  . sucralfate  1 g Oral Q6H  . ticagrelor  90 mg Oral BID   Continuous Infusions: . sodium chloride     PRN Meds: sodium chloride, acetaminophen, diazepam, iopamidol, morphine injection, nitroGLYCERIN, ondansetron (ZOFRAN) IV, pneumococcal 23 valent vaccine, sodium chloride flush, zolpidem   Vital Signs    Vitals:   10/13/17 1603 10/13/17 2039 10/13/17 2346 10/14/17 0520  BP: 132/69 113/66 100/64 (!) 149/70  Pulse: 80 78    Resp: 13  16 19   Temp: 97.6 F (36.4 C) 98.3 F (36.8 C) 98.3 F (36.8 C) 98.1 F (36.7 C)  TempSrc: Oral Oral Oral Oral  SpO2: 100% 96% 96% 96%  Weight:    117 lb 11.6 oz (53.4 kg)  Height:        Intake/Output Summary (Last 24 hours) at 10/14/2017 0856 Last data filed at 10/13/2017 2101 Gross per 24 hour  Intake 125 ml  Output 2100 ml  Net -1975 ml   Filed Weights   10/12/17 0519 10/13/17 0356 10/14/17 0520  Weight: 119 lb 8 oz (54.2 kg) 120 lb 1.6 oz (54.5 kg) 117 lb 11.6 oz (53.4 kg)    Telemetry    SR -  Personally Reviewed  ECG    N/a - Personally Reviewed  Physical Exam   General: Older hispanic female appearing in no acute distress. Head: Normocephalic, atraumatic.  Neck: Supple, no JVD. Lungs:  Resp regular and unlabored, CTA. Heart: RRR, S1, S2, no murmur. Abdomen: Soft, moderately tender to palpation, non-distended with normoactive bowel sounds.  Extremities: No clubbing, cyanosis, edema. Distal pedal pulses are 2+ bilaterally. Neuro: Alert and oriented X 3. Moves all extremities spontaneously. Psych: Normal affect.  Labs    Chemistry Recent Labs  Lab 10/10/17 1048  10/12/17 0309 10/13/17 0313 10/14/17 0228  NA 129*   < > 130* 128* 132*  K 4.1   < > 3.7 4.0 3.7  CL 97*   < > 102 98* 98*  CO2 20*   < > 18* 21* 22  GLUCOSE 162*   < > 151* 123* 125*  BUN 46*   < > 32* 25* 24*  CREATININE 3.84*   < > 2.80* 2.45* 2.30*  CALCIUM 8.2*   < > 8.0* 8.2* 8.4*  PROT 6.1*  --   --   --  6.0*  ALBUMIN 2.7*  --   --   --  2.7*  AST 28  --   --   --  25  ALT 22  --   --   --  17  ALKPHOS 105  --   --   --  88  BILITOT 0.5  --   --   --  0.6  GFRNONAA 10*   < > 15* 18* 19*  GFRAA 12*   < > 17* 20* 22*  ANIONGAP 12   < > 10 9 12    < > = values in this interval not displayed.     Hematology Recent Labs  Lab 10/11/17 0742 10/12/17 0309 10/13/17 0313  WBC 6.8 7.2 6.7  RBC 3.51* 3.19* 3.40*  HGB 9.6* 8.9* 9.3*  HCT 29.3* 26.9* 28.6*  MCV 83.5 84.3 84.1  MCH 27.4 27.9 27.4  MCHC 32.8 33.1 32.5  RDW 14.1 14.3 14.0  PLT 449* 410* 395    Cardiac Enzymes No results for input(s): TROPONINI in the last 168 hours. No results for input(s): TROPIPOC in the last 168 hours.   BNP No results for input(s): BNP, PROBNP in the last 168 hours.   DDimer No results for input(s): DDIMER in the last 168 hours.    Radiology    Ct Abdomen Pelvis Wo Contrast  Result Date: 10/12/2017 CLINICAL DATA:  Abdominal distension.  Upper abdominal pain. EXAM: CT ABDOMEN AND PELVIS WITHOUT  CONTRAST TECHNIQUE: Multidetector CT imaging of the abdomen and pelvis was performed following the standard protocol without IV contrast. COMPARISON:  Ultrasound 10/11/2017 FINDINGS: Lower chest: Lung bases are clear. Hepatobiliary: No focal hepatic lesion. No biliary duct dilatation. Gallbladder is mildly distended but within normal limits. Probable sludge within the fundus (sagittal image 68) no pericholecystic fluid inflammation identified on noncontrast exam. Common bile duct normal caliber. Pancreas: Pancreas is normal. No ductal dilatation. No pancreatic inflammation. Spleen: Normal spleen Adrenals/urinary tract: Adrenal glands and kidneys are normal. The RIGHT kidney smaller than LEFT. No obstructive uropathy. Gas within the bladder. Foley catheter within the bladder. Stomach/Bowel: Stomach, small bowel, appendix, and cecum are normal. The colon and rectosigmoid colon are normal. Vascular/Lymphatic: Abdominal aorta is normal caliber with atherosclerotic calcification. There is no retroperitoneal or periportal lymphadenopathy. No pelvic lymphadenopathy. Reproductive: Uterus normal for age Other: No free fluid. Musculoskeletal: No aggressive osseous lesion. IMPRESSION: 1. Large bilateral pleural effusions with dense bibasilar passive atelectasis. 2. No acute findings in abdomen pelvis. 3. Gallbladder sludge and mild distension. 4.  Aortic Atherosclerosis (ICD10-I70.0). 5. Foley catheter in bladder. Electronically Signed   By: Genevive Bi M.D.   On: 10/12/2017 16:52    Cardiac Studies   Cath: 10/05/17  Conclusion     Mid LAD lesion is 90% stenosed.  Prox LAD lesion is 99% stenosed.  Dist LAD lesion is 99% stenosed.  1st Mrg lesion is 95% stenosed.  Ost 2nd Mrg to 2nd Mrg lesion is 30% stenosed.  Ost RPDA to RPDA lesion is 30% stenosed.  Post intervention, there is a 0% residual stenosis.  Post intervention, there is a 0% residual stenosis.  Post intervention, there is a 35%  residual stenosis.  A stent was successfully placed.  A stent was successfully placed.   Acute late presentation anterior ST segment elevation myocardial infarction with demonstration of subtotal long proximal LAD stenosis, 90% mid stenosis, and 99% apical stenosis with reduced apical flow.  Left circumflex disease with 50% proximal OM1 stenosis followed by distal 95% marginal stenosis prior to giving off distal branches in the OM1 vessel.  Mild diffuse irregularity of a large dominant RCA with 30% narrowing in the  PDA vessel.  LVEDP 34 mmHg/  Successful PCI to the LAD with ultimate insertion of a 2.526 mm Resolute DES stent postdilated to 2.8 mm proximally and 2.68 mm distally with the 99% stenosis being reduced to 0%; DES stenting of the mid 90% stenosis with ultimate insertion of a 2.2512 mm Resolute DES stent with residual narrowing at 0%, and initial 99% apical thrombotic stenosis with reduced flow treated with low-level PTCA and intracoronary verapamil with improvement to approximately 35-40% in a very small apical LAD segment.  RECOMMENDATION: DAPT for minimum of 1 year.  Diuresis with elevated EDP.  An echo Doppler study will be obtained in a.m.  Patient should be started on low-dose ACE inhibition, carvedilol, probable nitrate therapy, and high potency statin treatment.   TTE: 10/06/17  Study Conclusions  - Left ventricle: The cavity size was normal. There was mild focal   basal hypertrophy of the septum. Mid to apical   inferoseptal/anteroseptal akinesis, apical lateral akinesis, mid   to apical anterior akinesis, apical inferior akinesis, akinesis   of the true apex. The estimated ejection fraction was 30%.   Features are consistent with a pseudonormal left ventricular   filling pattern, with concomitant abnormal relaxation and   increased filling pressure (grade 2 diastolic dysfunction). - Aortic valve: Trileaflet; moderately calcified leaflets.   Sclerosis without  stenosis. There was trivial regurgitation. - Mitral valve: Mildly calcified annulus. There was trivial   regurgitation. - Right ventricle: The cavity size was normal. Systolic function   was normal. - Pulmonary arteries: No complete TR doppler jet so unable to   estimate PA systolic pressure. - Inferior vena cava: The vessel was normal in size. The   respirophasic diameter changes were in the normal range (= 50%),   consistent with normal central venous pressure. - Pericardium, extracardiac: A trivial pericardial effusion was   identified.  Impressions:  - Normal LV size with mild focal basal septal hypertrophy. EF 30%   with wall motion abnormalities as noted above in LAD   distribution. Normal RV size and systolic function. Aortic valve   sclerosis without significant stenosis.  Patient Profile     77 y.o. female with history of diabetes mellitus who presented with acute anterior myocardial infarction, starting 24-36 hours before presentation. LAD stenting performed. LAD flow was decreased postprocedure and there was evidence of microcirculatory obstruction. TIMI blush score 0.Severe hyponatremia of uncertain duration, not responding to diuresis for heart failure management.Worsening renal function for which nephrology is following.  Assessment & Plan    1. Late presenting MI: Had PCI/DES to the p/mLAD with ballooning to distal segment. Placed on DAPT with ASA/Brilinta. Struggled with chest pain post MI felt to be related to pericarditis. Given decadron with improvement. No further reports of chest pain. On statin, and low dose BB. I wonder whether some of her symptoms are related to [place]. If she has continued shortness of breath after taking Brilenta we can consider transitioning her to Plavix prior to discharge. Currentlu denies CP  2. Severe hyponatremia: Nephrology following. Was placed on hypertonic saline.. Na has increased to 130--->128---> 132  3. ICM: EF 30% on  echo. Did struggle with oliguria post cath, but now seems to be diuresing well. No signs of overload on exam other than significantly diminished breath sounds at the bases consistent with the abdominal CT findings of bilateral pleural effusions.. I/O -7.3 L   4. Acute renal failure: Cr continues to rise, at 3.86---> 3.24 --> 2.8---> 2.45 Possible CN?  Nephrology following. Given her cannulation of pleural fluid in the absence of Lasix the renal service has suggested restarting oral diuretics which I agree with. We will continue to follow her serum creatinine and sodium levels.  5. Anemia: Hgb has been around 9.5, down to 8.7---> 9.6--> 8.9 --> 9.3 stable . No signs of overt bleeding.  -- follow CBC  6. Abd pain: her abdominal pain is actually worse today  Abdominal ultrasound showed a slightly large gallbladder with sludge but no overt cholecystitis. The GI service has consulted and recommended twice a day PPI , Carafate and abdominal CT which was performed yesterday that did not show any significant abnormalities other than bilateral pleural effusions.. They are following. She is sched for hepatobiliary scan this AM. Her abd is soft with hyperactive BS but diffusely tender.   GI workup continues to progress. She is stable cardiovascularly. Her renal function continues to improve as as does her serum sodium. Her hemoglobin has remained stable. I continue to worry about her abd pain. . We may consider transitioning from Brilenta to Plavix if she has continued symptoms of shortness of breath after taking the Brilenta.  Signed, Nanetta BattyJonathan Deionte Spivack, MD  10/14/2017, 8:56 AM  Pager # [phone]   For questions or updates, please contact [facility] Please consult [email or web address] for contact info under Cardiology/STEMI.

## 2017-10-14 NOTE — Progress Notes (Signed)
Patient ID: Sheila White, female   DOB: 07/16/1940, 77 y.o.   MRN: [id number] [facility] Progress Note   Assessment/ Plan:    1.  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  She required 3% NS 4/14 which resulted in a good response- water diuresis.  Restarted Lasix 40 mg daily, Na continues to improve.  We will sign off at this time, please let us know if there are any questions.   2.  Acute kidney injury: Suspected to be hemodynamically mediated from CHF exacerbation/renal hypoperfusion-possibly also with mild injury from contrast exposure.  This is improving.  3.  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  On colchicine.  Her pain isn't really getting better--> not sure if from ongoing pericarditis (but being treated) or something else  4.  Acute systolic heart failure: LVEF 30%.  5.  RUQ/ abd pain: GI following, RUQ US with distended GB, CT abd/ pelvis with PO contrast showing bilateral pleural effusions but no acute abdominal abnormality.  HIDA scan terminated early per pt request.    4.  Dispo: Pending   Subjective:    HIDA scan today--> was terminated early.    Objective:   BP (!) 146/71 (BP Location: Right Arm)   Pulse 79   Temp 98.1 F (36.7 C) (Oral)   Resp 20   Ht 5' (1.524 m)   Wt 53.4 kg (117 lb 11.6 oz)   SpO2 100%   BMI 22.99 kg/m   Intake/Output Summary (Last 24 hours) at 10/14/2017 1545 Last data filed at 10/13/2017 2101 Gross per 24 hour  Intake -  Output 2100 ml  Net -2100 ml   Weight change: -1.077 kg (-2 lb 6 oz)  Physical Exam: Gen: lying in bed, appears uncomfortable HEENT: JVP improved CVS: Pulse regular rhythm, normal rate, S1 and S2 normal without any rub Resp: Anterior chest clear to auscultation without  rales, no rhonchi, diminished at bases Abd: Soft, flat, nontender Ext: No lower extremity edema  Imaging: Nm Hepatobiliary Liver Func  Result Date: 10/14/2017 CLINICAL DATA:  History of abdominal pain. Following ingestion of Ensure, patient had copious diarrhea and refused further imaging. EXAM: NUCLEAR MEDICINE HEPATOBILIARY IMAGING TECHNIQUE: Sequential images of the abdomen were obtained out to 60 minutes following intravenous administration of radiopharmaceutical. RADIOPHARMACEUTICALS:  5.15 mCi Tc-5711m  Choletec IV COMPARISON:  CT of the abdomen and pelvis on 10/12/2017 FINDINGS: Prompt uptake and biliary excretion of activity by the liver is seen. Gallbladder activity is visualized, consistent with patency of cystic duct. At the conclusion of the exam, no definitive bowel activity is identified. Therefore patency of the common bile duct cannot be assessed. Exam was terminated early at the request of the patient. IMPRESSION: 1. No evidence for acute cholecystitis.  Patent cystic duct. 2. Patency of the common bile duct not assessed due to early termination of the exam. Electronically Signed   By: Norva PavlovElizabeth  Brown M.D.   On: 10/14/2017 13:23    Labs: BMET Recent Labs  Lab 10/08/17 1619  10/09/17 0645  10/09/17 1616 10/09/17 1921 10/10/17 1048 10/11/17 0742 10/12/17 0309 10/13/17 0313 10/14/17 0228  NA 113*   < > 119*   < > 123* 124* 129* 130* 130* 128* 132*  K 4.6  --  4.2  --   --   --  4.1  3.8 3.7 4.0 3.7  CL 81*  --  88*  --   --   --  97* 100* 102 98* 98*  CO2 20*  --  18*  --   --   --  20* 19* 18* 21* 22  GLUCOSE 146*  --  115*  --   --   --  162* 178* 151* 123* 125*  BUN 47*  --  48*  --   --   --  46* 36* 32* 25* 24*  CREATININE 3.62*  --  3.86*  --   --   --  3.84* 3.24* 2.80* 2.45* 2.30*  CALCIUM 7.5*  --  7.3*  --   --   --  8.2* 8.1* 8.0* 8.2* 8.4*   < > = values in this interval not displayed.   CBC Recent Labs  Lab 10/11/17 0742 10/12/17 0309 10/13/17 0313  10/14/17 1339  WBC 6.8 7.2 6.7 6.5  NEUTROABS  --   --   --  4.0  HGB 9.6* 8.9* 9.3* 10.4*  HCT 29.3* 26.9* 28.6* 31.3*  MCV 83.5 84.3 84.1 82.8  PLT 449* 410* 395 427*    Medications:    . aspirin  81 mg Oral Daily  . atorvastatin  80 mg Oral q1800  . colchicine  0.6 mg Oral Daily  . docusate sodium  100 mg Oral Daily  . furosemide  40 mg Oral Daily  . insulin aspart  0-5 Units Subcutaneous QHS  . insulin aspart  0-9 Units Subcutaneous TID WC  . metoprolol tartrate  12.5 mg Oral BID  . pantoprazole  40 mg Oral BID  . polyethylene glycol  17 g Oral Daily  . sodium chloride flush  3 mL Intravenous Q12H  . sucralfate  1 g Oral Q6H  . ticagrelor  90 mg Oral BID   Bufford Buttner MD [facility] pgr [phone] 10/14/2017, 3:45 PM

## 2017-10-14 NOTE — Progress Notes (Signed)
Physical Therapy Treatment Patient Details Name: Sheila White MRN: [id number] DOB: 07-03-1940 Today's Date: 10/14/2017    History of Present Illness Patient is a 77 y.o. F with significant PMH of diabetes mellitus who presented to ED with chest pain and found to have an acute anterior myocardial infarction. Treated with angioplasty and stent. Devleoped post infarction pericarditis and hyponatremia.     PT Comments    Pt willing to work a little with PT today despite pain. Mild dizziness with standing reported today that resolved quickly. Pt is progressing toward goals and should benefit from continued PT to maximize her mobility toward pre-admission levels. Acute PT to continue during pt's hospital stay.    Follow Up Recommendations  Home health PT;Supervision/Assistance - 24 hour     Equipment Recommendations  Rolling walker with 5" wheels;3in1 (PT)    Precautions / Restrictions Precautions Precautions: Fall Restrictions Weight Bearing Restrictions: No    Mobility  Bed Mobility Overal bed mobility: Modified Independent             General bed mobility comments: pt able to move herself from supine to sitting at edge of bed with increased time needed due to pain. distant supervision for sitting balance at edge of bed with/without UE support, feet on ground  Transfers Overall transfer level: Needs assistance Equipment used: Rolling walker (2 wheeled) Transfers: Sit to/from [employer] Sit to Stand: Supervision Stand pivot transfers: Min guard       General transfer comment: cues for hand placement and weight shifting on 1st rep, no cues needed with 2cd rep. min guard assist with cues on sequencing for stand pivot bed to chair after 2cd sit/stand.   Ambulation/Gait Ambulation/Gait assistance: Min assist Ambulation Distance (Feet): 30 Feet Assistive device: 1 person hand held assist Gait Pattern/deviations: Step-through pattern;Decreased stride  length;Narrow base of support;Trunk flexed Gait velocity: decreased   General Gait Details: min HHA for gait with no overt balance losses noted. short steps with good foot clearance. limited distance due to abdominal pain and pt wanting to stay in room. mild dizziness reported intially that resolved with progression of gait.        Cognition Arousal/Alertness: Awake/alert Behavior During Therapy: WFL for tasks assessed/performed Overall Cognitive Status: Within Functional Limits for tasks assessed               Pertinent Vitals/Pain Pain Assessment: 0-10 Pain Score: 5  Pain Location: Abdomen Pain Descriptors / Indicators: Aching;Cramping;Discomfort Pain Intervention(s): Limited activity within patient's tolerance;Monitored during session;Premedicated before session;Repositioned     PT Goals (current goals can now be found in the care plan section) Acute Rehab PT Goals Patient Stated Goal: Be able to go to church PT Goal Formulation: With patient Time For Goal Achievement: 10/26/17 Potential to Achieve Goals: Good Additional Goals Additional Goal #1: Patient will improve gait speed by 0.2 ft/s to decrease fall risk Progress towards PT goals: Progressing toward goals    Frequency    Min 3X/week      PT Plan Current plan remains appropriate    AM-PAC PT "6 Clicks" Daily Activity  Outcome Measure  Difficulty turning over in bed (including adjusting bedclothes, sheets and blankets)?: A Little Difficulty moving from lying on back to sitting on the side of the bed? : A Little Difficulty sitting down on and standing up from a chair with arms (e.g., wheelchair, bedside commode, etc,.)?: A Little Help needed moving to and from a bed to chair (including a wheelchair)?: A Little Help  needed walking in hospital room?: A Little Help needed climbing 3-5 steps with a railing? : A Lot 6 Click Score: 17    End of Session Equipment Utilized During Treatment: Gait  belt Activity Tolerance: Patient tolerated treatment well;No increased pain;Patient limited by pain Patient left: in chair;with call bell/phone within reach;with family/visitor present Nurse Communication: Mobility status PT Visit Diagnosis: Unsteadiness on feet (R26.81);Other abnormalities of gait and mobility (R26.89);Muscle weakness (generalized) (M62.81)     Time: [id number] PT Time Calculation (min) (ACUTE ONLY): 17 min  Charges:  $Gait Training: 8-22 mins                    Sallyanne KusterKathy Bury, PTA, CLT [facility] [phone] 10/14/17, 10:17 AM  Sallyanne KusterBury, Kathy 10/14/2017, 10:15 AM

## 2017-10-15 DIAGNOSIS — R933 Abnormal findings on diagnostic imaging of other parts of digestive tract: Secondary | ICD-10-CM

## 2017-10-15 LAB — GLUCOSE, CAPILLARY
GLUCOSE-CAPILLARY: 129 mg/dL — AB (ref 65–99)
Glucose-Capillary: 152 mg/dL — ABNORMAL HIGH (ref 65–99)
Glucose-Capillary: 181 mg/dL — ABNORMAL HIGH (ref 65–99)
Glucose-Capillary: 141 mg/dL — ABNORMAL HIGH (ref 65–99)

## 2017-10-15 LAB — OCCULT BLOOD X 1 CARD TO LAB, STOOL
FECAL OCCULT BLD: NEGATIVE
Fecal Occult Bld: NEGATIVE

## 2017-10-15 LAB — C DIFFICILE QUICK SCREEN W PCR REFLEX
C DIFFICILE (CDIFF) INTERP: NOT DETECTED
C Diff antigen: NEGATIVE
C Diff toxin: NEGATIVE

## 2017-10-15 NOTE — Progress Notes (Signed)
CARDIAC REHAB PHASE I   PRE:  Rate/Rhythm: 85 sinus rhythm  BP:  Supine:   Sitting: 94/44  Standing:    SaO2: 100%ra   MODE:  Ambulation: 50  ft   POST:  Rate/Rhythem: 77 sinus rhythm  BP:  Supine:   Sitting: 109/91  Standing:    SaO2: 100%ra   0913-1005 Upon arrival to room, pt largely incontinent of stool.  RN and CNA assisted with cleaning pt.  Stool sample obtained for hemoccult and other stool testing.   Pt ambulated in hallway x2 assist using rolling walker.  Pt with unsteady gait, weak, pulls significantly to right. No english, pt family translated. Pt returned to chair.  C/o drowsiness.  Call light in reach, family at bedside.  Pt and family instructed in call light use, instructed to use for pain or bathroom assistance.  Understanding verbalized and return demonstrated.     CiscoJoann Hamilton Ozell Ferrera

## 2017-10-15 NOTE — Progress Notes (Signed)
Patient reports midline abdominal pain 8 /10. Patient has had diarrhea on 4/19 x3 and 4/20 x1. Stool is dark green mucus consistency.  Lab sample for occult blood collected. Microbiology sample collected without an order. [facility] called [phone] and message left.

## 2017-10-15 NOTE — Progress Notes (Signed)
Progress Note  Patient Name: Sheila White Date of Encounter: 10/15/2017  Primary Cardiologist: No primary care provider on file.  Subjective   Primary issue is GI significant diarrhea and abdominal pain  Inpatient Medications    Scheduled Meds: . aspirin  81 mg Oral Daily  . atorvastatin  80 mg Oral q1800  . colchicine  0.6 mg Oral Daily  . docusate sodium  100 mg Oral Daily  . furosemide  40 mg Oral Daily  . insulin aspart  0-5 Units Subcutaneous QHS  . insulin aspart  0-9 Units Subcutaneous TID WC  . metoprolol tartrate  12.5 mg Oral BID  . pantoprazole  40 mg Oral BID  . polyethylene glycol  17 g Oral Daily  . sodium chloride flush  3 mL Intravenous Q12H  . sucralfate  1 g Oral Q6H  . ticagrelor  90 mg Oral BID   Continuous Infusions: . sodium chloride     PRN Meds: sodium chloride, acetaminophen, diazepam, iopamidol, morphine injection, nitroGLYCERIN, ondansetron (ZOFRAN) IV, pneumococcal 23 valent vaccine, sodium chloride flush, zolpidem   Vital Signs    Vitals:   10/14/17 1934 10/14/17 2346 10/15/17 0521 10/15/17 1032  BP: (!) 163/64 113/62 134/73 110/69  Pulse: 84 76 84   Resp: 20 (!) 25 16 19   Temp: 97.7 F (36.5 C) (!) 97.5 F (36.4 C) 97.8 F (36.6 C) (!) 97.4 F (36.3 C)  TempSrc: Oral Oral Axillary Oral  SpO2: 99% 100% 100% 100%  Weight:   121 lb 4.1 oz (55 kg)   Height:        Intake/Output Summary (Last 24 hours) at 10/15/2017 1114 Last data filed at 10/15/2017 1000 Gross per 24 hour  Intake 1010 ml  Output 426 ml  Net 584 ml   Filed Weights   10/13/17 0356 10/14/17 0520 10/15/17 0521  Weight: 120 lb 1.6 oz (54.5 kg) 117 lb 11.6 oz (53.4 kg) 121 lb 4.1 oz (55 kg)    Telemetry    SR - Personally Reviewed  ECG    N/a - Personally Reviewed  Physical Exam   BP 110/69   Pulse 84   Temp (!) 97.4 F (36.3 C) (Oral)   Resp 19   Ht 5' (1.524 m)   Wt 121 lb 4.1 oz (55 kg)   SpO2 100%   BMI 23.68 kg/m  Affect appropriate Elderly  hispanic female  HEENT: normal Neck supple with no adenopathy JVP normal no bruits no thyromegaly Lungs clear with no wheezing and good diaphragmatic motion Heart:  S1/S2 no murmur, no rub, gallop or click PMI normal Abdomen: benighn, BS positve, no tenderness, no AAA no bruit.  No HSM or HJR Distal pulses intact with no bruits No edema Neuro non-focal Skin warm and dry No muscular weakness   Labs    Chemistry Recent Labs  Lab 10/10/17 1048  10/12/17 0309 10/13/17 0313 10/14/17 0228  NA 129*   < > 130* 128* 132*  K 4.1   < > 3.7 4.0 3.7  CL 97*   < > 102 98* 98*  CO2 20*   < > 18* 21* 22  GLUCOSE 162*   < > 151* 123* 125*  BUN 46*   < > 32* 25* 24*  CREATININE 3.84*   < > 2.80* 2.45* 2.30*  CALCIUM 8.2*   < > 8.0* 8.2* 8.4*  PROT 6.1*  --   --   --  6.0*  ALBUMIN 2.7*  --   --   --  2.7*  AST 28  --   --   --  25  ALT 22  --   --   --  17  ALKPHOS 105  --   --   --  88  BILITOT 0.5  --   --   --  0.6  GFRNONAA 10*   < > 15* 18* 19*  GFRAA 12*   < > 17* 20* 22*  ANIONGAP 12   < > 10 9 12    < > = values in this interval not displayed.     Hematology Recent Labs  Lab 10/12/17 0309 10/13/17 0313 10/14/17 1339  WBC 7.2 6.7 6.5  RBC 3.19* 3.40* 3.78*  HGB 8.9* 9.3* 10.4*  HCT 26.9* 28.6* 31.3*  MCV 84.3 84.1 82.8  MCH 27.9 27.4 27.5  MCHC 33.1 32.5 33.2  RDW 14.3 14.0 14.4  PLT 410* 395 427*    Cardiac Enzymes Recent Labs  Lab 10/14/17 0858  TROPONINI 0.42*   No results for input(s): TROPIPOC in the last 168 hours.   BNP Recent Labs  Lab 10/14/17 0858  BNP 2,856.8*     DDimer No results for input(s): DDIMER in the last 168 hours.    Radiology    Nm Hepatobiliary Liver Func  Result Date: 10/14/2017 CLINICAL DATA:  History of abdominal pain. Following ingestion of Ensure, patient had copious diarrhea and refused further imaging. EXAM: NUCLEAR MEDICINE HEPATOBILIARY IMAGING TECHNIQUE: Sequential images of the abdomen were obtained out to 60  minutes following intravenous administration of radiopharmaceutical. RADIOPHARMACEUTICALS:  5.15 mCi Tc-2872m  Choletec IV COMPARISON:  CT of the abdomen and pelvis on 10/12/2017 FINDINGS: Prompt uptake and biliary excretion of activity by the liver is seen. Gallbladder activity is visualized, consistent with patency of cystic duct. At the conclusion of the exam, no definitive bowel activity is identified. Therefore patency of the common bile duct cannot be assessed. Exam was terminated early at the request of the patient. IMPRESSION: 1. No evidence for acute cholecystitis.  Patent cystic duct. 2. Patency of the common bile duct not assessed due to early termination of the exam. Electronically Signed   By: Norva PavlovElizabeth  Brown M.D.   On: 10/14/2017 13:23    Cardiac Studies   Cath: 10/05/17  Conclusion     Mid LAD lesion is 90% stenosed.  Prox LAD lesion is 99% stenosed.  Dist LAD lesion is 99% stenosed.  1st Mrg lesion is 95% stenosed.  Ost 2nd Mrg to 2nd Mrg lesion is 30% stenosed.  Ost RPDA to RPDA lesion is 30% stenosed.  Post intervention, there is a 0% residual stenosis.  Post intervention, there is a 0% residual stenosis.  Post intervention, there is a 35% residual stenosis.  A stent was successfully placed.  A stent was successfully placed.   Acute late presentation anterior ST segment elevation myocardial infarction with demonstration of subtotal long proximal LAD stenosis, 90% mid stenosis, and 99% apical stenosis with reduced apical flow.  Left circumflex disease with 50% proximal OM1 stenosis followed by distal 95% marginal stenosis prior to giving off distal branches in the OM1 vessel.  Mild diffuse irregularity of a large dominant RCA with 30% narrowing in the PDA vessel.  LVEDP 34 mmHg/  Successful PCI to the LAD with ultimate insertion of a 2.526 mm Resolute DES stent postdilated to 2.8 mm proximally and 2.68 mm distally with the 99% stenosis being reduced to  0%; DES stenting of the mid 90% stenosis with ultimate insertion of a  2.2512 mm Resolute DES stent with residual narrowing at 0%, and initial 99% apical thrombotic stenosis with reduced flow treated with low-level PTCA and intracoronary verapamil with improvement to approximately 35-40% in a very small apical LAD segment.  RECOMMENDATION: DAPT for minimum of 1 year.  Diuresis with elevated EDP.  An echo Doppler study will be obtained in a.m.  Patient should be started on low-dose ACE inhibition, carvedilol, probable nitrate therapy, and high potency statin treatment.   TTE: 10/06/17  Study Conclusions  - Left ventricle: The cavity size was normal. There was mild focal   basal hypertrophy of the septum. Mid to apical   inferoseptal/anteroseptal akinesis, apical lateral akinesis, mid   to apical anterior akinesis, apical inferior akinesis, akinesis   of the true apex. The estimated ejection fraction was 30%.   Features are consistent with a pseudonormal left ventricular   filling pattern, with concomitant abnormal relaxation and   increased filling pressure (grade 2 diastolic dysfunction). - Aortic valve: Trileaflet; moderately calcified leaflets.   Sclerosis without stenosis. There was trivial regurgitation. - Mitral valve: Mildly calcified annulus. There was trivial   regurgitation. - Right ventricle: The cavity size was normal. Systolic function   was normal. - Pulmonary arteries: No complete TR doppler jet so unable to   estimate PA systolic pressure. - Inferior vena cava: The vessel was normal in size. The   respirophasic diameter changes were in the normal range (= 50%),   consistent with normal central venous pressure. - Pericardium, extracardiac: A trivial pericardial effusion was   identified.  Impressions:  - Normal LV size with mild focal basal septal hypertrophy. EF 30%   with wall motion abnormalities as noted above in LAD   distribution. Normal RV size and systolic  function. Aortic valve   sclerosis without significant stenosis.  Patient Profile     77 y.o. female with history of diabetes mellitus who presented with acute anterior myocardial infarction, starting 24-36 hours before presentation. LAD stenting performed. LAD flow was decreased postprocedure and there was evidence of microcirculatory obstruction. TIMI blush score 0.Severe hyponatremia of uncertain duration, not responding to diuresis for heart failure management.Worsening renal function for which nephrology is following.  Assessment & Plan    1. Late presenting MI:  DES to LAD late presentation EF 30% continue beta blocker DAT no chest pain   2. Severe hyponatremia: Nephrology following. Was placed on hypertonic saline.. Na has increased to 130--->128---> 132  3. ICM: EF 30% on echo. Did struggle with oliguria post cath, but now seems to be diuresing well. No signs of overload on exam other than significantly diminished breath sounds at the bases consistent with the abdominal CT findings of bilateral pleural effusions.. I/O -7.3 L   4. Acute renal failure: Cr 2.3 today urinating better   5. Anemia: stable likely related to acute blood thinners age and CRF stable Hct 31.3   6. Abd pain: biggest issue Cdiff sent HIDA negative per GI   Time spent reviewing chart, labs HIDA, CT cath films echo examining patient and discussing care with family and nurse 35 minutes   Charlton Haws

## 2017-10-15 NOTE — Progress Notes (Signed)
Progress Note   Subjective  Patient had HIDA last night. Patient aborted procedure due to diarrhea but study showed no cholecystitis. Family reports significant improvement in pain since she has had some loose stools, say the last 24 hours is the best she has felt. Nursing says she's had 3 episodes of diarrhea since it started yesterday, otherwise agree pain has been better.   Objective   Vital signs in last 24 hours: Temp:  [97.4 F (36.3 C)-97.8 F (36.6 C)] 97.4 F (36.3 C) (04/20 1032) Pulse Rate:  [76-84] 84 (04/20 0521) Resp:  [16-25] 19 (04/20 1032) BP: (110-163)/(62-88) 110/69 (04/20 1032) SpO2:  [99 %-100 %] 100 % (04/20 1032) Weight:  [121 lb 4.1 oz (55 kg)] 121 lb 4.1 oz (55 kg) (04/20 0521) Last BM Date: 10/14/17 General:    female in NAD Heart:  Regular rate and rhythm; no murmurs Lungs: Respirations even and unlabored, lungs CTA bilaterally Abdomen:  Soft, nontender and nondistended. . Extremities:  Without edema. Neurologic:  Alert and oriented,  grossly normal neurologically. Psych:  Cooperative. Normal mood and affect.  Intake/Output from previous day: 04/19 0701 - 04/20 0700 In: 1010 [P.O.:60] Out: 425 [Urine:425] Intake/Output this shift: Total I/O In: 100 [P.O.:100] Out: 351 [Urine:350; Stool:1]  Lab Results: Recent Labs    10/13/17 0313 10/14/17 1339  WBC 6.7 6.5  HGB 9.3* 10.4*  HCT 28.6* 31.3*  PLT 395 427*   BMET Recent Labs    10/13/17 0313 10/14/17 0228  NA 128* 132*  K 4.0 3.7  CL 98* 98*  CO2 21* 22  GLUCOSE 123* 125*  BUN 25* 24*  CREATININE 2.45* 2.30*  CALCIUM 8.2* 8.4*   LFT Recent Labs    10/14/17 0228  PROT 6.0*  ALBUMIN 2.7*  AST 25  ALT 17  ALKPHOS 88  BILITOT 0.6  BILIDIR 0.1  IBILI 0.5   PT/INR No results for input(s): LABPROT, INR in the last 72 hours.  Studies/Results: Nm Hepatobiliary Liver Func  Result Date: 10/14/2017 CLINICAL DATA:  History of abdominal pain. Following ingestion of  Ensure, patient had copious diarrhea and refused further imaging. EXAM: NUCLEAR MEDICINE HEPATOBILIARY IMAGING TECHNIQUE: Sequential images of the abdomen were obtained out to 60 minutes following intravenous administration of radiopharmaceutical. RADIOPHARMACEUTICALS:  5.15 mCi Tc-24m  Choletec IV COMPARISON:  CT of the abdomen and pelvis on 10/12/2017 FINDINGS: Prompt uptake and biliary excretion of activity by the liver is seen. Gallbladder activity is visualized, consistent with patency of cystic duct. At the conclusion of the exam, no definitive bowel activity is identified. Therefore patency of the common bile duct cannot be assessed. Exam was terminated early at the request of the patient. IMPRESSION: 1. No evidence for acute cholecystitis.  Patent cystic duct. 2. Patency of the common bile duct not assessed due to early termination of the exam. Electronically Signed   By: Norva Pavlov M.D.   On: 10/14/2017 13:23       Assessment / Plan:   77 y/o female recovering from MI s/p PCI with DES placement to the LAD, with ongoing upper abdominal pain.  Initial US showed a distended gallbladder but no overt cholecystitis or gallstones. LFTs / lipase normal. CT scan without contrast due to renal function, showed distended gallbladder with some sludge but no other pathology. We have been empirically treated for possible PUD with BID PPI and carafate. HIDA scan yesterday was only a partial study as the patient aborted the test, but it does  not show any evidence of cholecystitis, which is good.   She has since developed a few episodes of diarrhea, family states her pain has been improved since that occurred, however she had no constipation prior to this occurrence. Given she had another episode of diarrhea, will send stools for C diff.   Overall she appears improved compared to yesterday, family thinks pain is much better. Will hold off on EGD right now (have hoped to avoid that in light of recent MI),  will await stool test for C diff, and check on her tomorrow.   Ileene PatrickSteven Rickardo Brinegar, MD [facility]

## 2017-10-16 LAB — GLUCOSE, CAPILLARY
GLUCOSE-CAPILLARY: 142 mg/dL — AB (ref 65–99)
Glucose-Capillary: 155 mg/dL — ABNORMAL HIGH (ref 65–99)
Glucose-Capillary: 97 mg/dL (ref 65–99)

## 2017-10-16 NOTE — Progress Notes (Signed)
Progress Note   Subjective  Patient with family this AM, they state she continues to feel better. Abdominal pain is improved, diarrhea getting better. C Diff negative.    Objective   Vital signs in last 24 hours: Temp:  [97.4 F (36.3 C)-98.5 F (36.9 C)] 97.6 F (36.4 C) (04/21 0806) Pulse Rate:  [76-82] 76 (04/21 0525) Resp:  [14-23] 18 (04/21 0806) BP: (107-143)/(55-77) 143/65 (04/21 0806) SpO2:  [92 %-100 %] 100 % (04/21 0806) Weight:  [117 lb 8.1 oz (53.3 kg)] 117 lb 8.1 oz (53.3 kg) (04/21 0525) Last BM Date: 10/15/17 General:    hispanic female in NAD Heart:  Regular rate and rhythm; Lungs: Respirations even and unlabored, lungs CTA bilaterally Abdomen:  Soft, nontender and nondistended. . Extremities:  Without edema. Neurologic:  Alert and oriented,  grossly normal neurologically. Psych:  Cooperative. Normal mood and affect.  Intake/Output from previous day: 04/20 0701 - 04/21 0700 In: 160 [P.O.:160] Out: 1201 [Urine:1200; Stool:1] Intake/Output this shift: Total I/O In: -  Out: 1 [Stool:1]  Lab Results: Recent Labs    10/14/17 1339  WBC 6.5  HGB 10.4*  HCT 31.3*  PLT 427*   BMET Recent Labs    10/14/17 0228  NA 132*  K 3.7  CL 98*  CO2 22  GLUCOSE 125*  BUN 24*  CREATININE 2.30*  CALCIUM 8.4*   LFT Recent Labs    10/14/17 0228  PROT 6.0*  ALBUMIN 2.7*  AST 25  ALT 17  ALKPHOS 88  BILITOT 0.6  BILIDIR 0.1  IBILI 0.5   PT/INR No results for input(s): LABPROT, INR in the last 72 hours.  Studies/Results: Nm Hepatobiliary Liver Func  Result Date: 10/14/2017 CLINICAL DATA:  History of abdominal pain. Following ingestion of Ensure, patient had copious diarrhea and refused further imaging. EXAM: NUCLEAR MEDICINE HEPATOBILIARY IMAGING TECHNIQUE: Sequential images of the abdomen were obtained out to 60 minutes following intravenous administration of radiopharmaceutical. RADIOPHARMACEUTICALS:  5.15 mCi Tc-3159m  Choletec IV COMPARISON:   CT of the abdomen and pelvis on 10/12/2017 FINDINGS: Prompt uptake and biliary excretion of activity by the liver is seen. Gallbladder activity is visualized, consistent with patency of cystic duct. At the conclusion of the exam, no definitive bowel activity is identified. Therefore patency of the common bile duct cannot be assessed. Exam was terminated early at the request of the patient. IMPRESSION: 1. No evidence for acute cholecystitis.  Patent cystic duct. 2. Patency of the common bile duct not assessed due to early termination of the exam. Electronically Signed   By: Norva PavlovElizabeth  Brown M.D.   On: 10/14/2017 13:23       Assessment / Plan:   77 y/o femalerecovering from MIs/p PCI with DES placement to the LAD, who developed upper abdominal pain.  Initial US showed a distended gallbladder but no overt cholecystitis or gallstones. LFTs / lipase normal. CT scan without contrast due to renal function, showed distended gallbladder with some sludge but no other pathology. We have been empirically treated for possible PUD with BID PPI and carafate. HIDA scan was only a partial study as the patient aborted the test, but it does not show any evidence of cholecystitis. She then developed a few episodes of diarrhea, C diff negagtive.   Overall she is significantly improved at this point. We had held off on EGD given her recent MI, it's possible she had PUD which has taken some time to improve on therapy. Bowel spasm also possible,  could consider trial of bentyl in the future if pain recurs in the setting of bowel movement.   We will sign off for now as she seems significantly improved. I would continue protonix for now, and carafate for another few days. If pain recurs or limits her and want to consider an EGD, please contact us for reassessment.    Ileene Patrick, MD [facility]

## 2017-10-16 NOTE — Progress Notes (Signed)
Progress Note  Patient Name: Sheila White Date of Encounter: 10/16/2017  Primary Cardiologist: No primary care provider on file.  Subjective   Overall improved no cardiac complaints abdominal pain improved Stool firmer Has been ambulating   Inpatient Medications    Scheduled Meds: . aspirin  81 mg Oral Daily  . atorvastatin  80 mg Oral q1800  . colchicine  0.6 mg Oral Daily  . docusate sodium  100 mg Oral Daily  . furosemide  40 mg Oral Daily  . insulin aspart  0-5 Units Subcutaneous QHS  . insulin aspart  0-9 Units Subcutaneous TID WC  . metoprolol tartrate  12.5 mg Oral BID  . pantoprazole  40 mg Oral BID  . polyethylene glycol  17 g Oral Daily  . sodium chloride flush  3 mL Intravenous Q12H  . sucralfate  1 g Oral Q6H  . ticagrelor  90 mg Oral BID   Continuous Infusions: . sodium chloride     PRN Meds: sodium chloride, acetaminophen, diazepam, iopamidol, morphine injection, nitroGLYCERIN, ondansetron (ZOFRAN) IV, pneumococcal 23 valent vaccine, sodium chloride flush, zolpidem   Vital Signs    Vitals:   10/16/17 0525 10/16/17 0700 10/16/17 0800 10/16/17 0806  BP: (!) 107/55   (!) 143/65  Pulse: 76     Resp: 20 19 14 18   Temp: 97.8 F (36.6 C)   97.6 F (36.4 C)  TempSrc: Oral   Oral  SpO2: 96% 99%  100%  Weight: 117 lb 8.1 oz (53.3 kg)     Height:        Intake/Output Summary (Last 24 hours) at 10/16/2017 0939 Last data filed at 10/16/2017 0800 Gross per 24 hour  Intake 60 ml  Output 1202 ml  Net -1142 ml   Filed Weights   10/14/17 0520 10/15/17 0521 10/16/17 0525  Weight: 117 lb 11.6 oz (53.4 kg) 121 lb 4.1 oz (55 kg) 117 lb 8.1 oz (53.3 kg)    Telemetry    SR - Personally Reviewed  ECG    N/a - Personally Reviewed  Physical Exam   BP (!) 143/65   Pulse 76   Temp 97.6 F (36.4 C) (Oral)   Resp 18   Ht 5' (1.524 m)   Wt 117 lb 8.1 oz (53.3 kg)   SpO2 100%   BMI 22.95 kg/m  Affect appropriate Elderly hispanic female  HEENT:  normal Neck supple with no adenopathy JVP normal no bruits no thyromegaly Lungs clear with no wheezing and good diaphragmatic motion Heart:  S1/S2 no murmur, no rub, gallop or click PMI normal Abdomen: benighn, BS positve, no tenderness, no AAA no bruit.  No HSM or HJR Distal pulses intact with no bruits No edema Neuro non-focal Skin warm and dry No muscular weakness   Labs    Chemistry Recent Labs  Lab 10/10/17 1048  10/12/17 0309 10/13/17 0313 10/14/17 0228  NA 129*   < > 130* 128* 132*  K 4.1   < > 3.7 4.0 3.7  CL 97*   < > 102 98* 98*  CO2 20*   < > 18* 21* 22  GLUCOSE 162*   < > 151* 123* 125*  BUN 46*   < > 32* 25* 24*  CREATININE 3.84*   < > 2.80* 2.45* 2.30*  CALCIUM 8.2*   < > 8.0* 8.2* 8.4*  PROT 6.1*  --   --   --  6.0*  ALBUMIN 2.7*  --   --   --  2.7*  AST 28  --   --   --  25  ALT 22  --   --   --  17  ALKPHOS 105  --   --   --  88  BILITOT 0.5  --   --   --  0.6  GFRNONAA 10*   < > 15* 18* 19*  GFRAA 12*   < > 17* 20* 22*  ANIONGAP 12   < > 10 9 12    < > = values in this interval not displayed.     Hematology Recent Labs  Lab 10/12/17 0309 10/13/17 0313 10/14/17 1339  WBC 7.2 6.7 6.5  RBC 3.19* 3.40* 3.78*  HGB 8.9* 9.3* 10.4*  HCT 26.9* 28.6* 31.3*  MCV 84.3 84.1 82.8  MCH 27.9 27.4 27.5  MCHC 33.1 32.5 33.2  RDW 14.3 14.0 14.4  PLT 410* 395 427*    Cardiac Enzymes Recent Labs  Lab 10/14/17 0858  TROPONINI 0.42*   No results for input(s): TROPIPOC in the last 168 hours.   BNP Recent Labs  Lab 10/14/17 0858  BNP 2,856.8*     DDimer No results for input(s): DDIMER in the last 168 hours.    Radiology    Nm Hepatobiliary Liver Func  Result Date: 10/14/2017 CLINICAL DATA:  History of abdominal pain. Following ingestion of Ensure, patient had copious diarrhea and refused further imaging. EXAM: NUCLEAR MEDICINE HEPATOBILIARY IMAGING TECHNIQUE: Sequential images of the abdomen were obtained out to 60 minutes following  intravenous administration of radiopharmaceutical. RADIOPHARMACEUTICALS:  5.15 mCi Tc-6858m  Choletec IV COMPARISON:  CT of the abdomen and pelvis on 10/12/2017 FINDINGS: Prompt uptake and biliary excretion of activity by the liver is seen. Gallbladder activity is visualized, consistent with patency of cystic duct. At the conclusion of the exam, no definitive bowel activity is identified. Therefore patency of the common bile duct cannot be assessed. Exam was terminated early at the request of the patient. IMPRESSION: 1. No evidence for acute cholecystitis.  Patent cystic duct. 2. Patency of the common bile duct not assessed due to early termination of the exam. Electronically Signed   By: Norva PavlovElizabeth  Brown M.D.   On: 10/14/2017 13:23    Cardiac Studies   Cath: 10/05/17  Conclusion     Mid LAD lesion is 90% stenosed.  Prox LAD lesion is 99% stenosed.  Dist LAD lesion is 99% stenosed.  1st Mrg lesion is 95% stenosed.  Ost 2nd Mrg to 2nd Mrg lesion is 30% stenosed.  Ost RPDA to RPDA lesion is 30% stenosed.  Post intervention, there is a 0% residual stenosis.  Post intervention, there is a 0% residual stenosis.  Post intervention, there is a 35% residual stenosis.  A stent was successfully placed.  A stent was successfully placed.   Acute late presentation anterior ST segment elevation myocardial infarction with demonstration of subtotal long proximal LAD stenosis, 90% mid stenosis, and 99% apical stenosis with reduced apical flow.  Left circumflex disease with 50% proximal OM1 stenosis followed by distal 95% marginal stenosis prior to giving off distal branches in the OM1 vessel.  Mild diffuse irregularity of a large dominant RCA with 30% narrowing in the PDA vessel.  LVEDP 34 mmHg/  Successful PCI to the LAD with ultimate insertion of a 2.526 mm Resolute DES stent postdilated to 2.8 mm proximally and 2.68 mm distally with the 99% stenosis being reduced to 0%; DES stenting of  the mid 90% stenosis with ultimate insertion of a  2.2512 mm Resolute DES stent with residual narrowing at 0%, and initial 99% apical thrombotic stenosis with reduced flow treated with low-level PTCA and intracoronary verapamil with improvement to approximately 35-40% in a very small apical LAD segment.  RECOMMENDATION: DAPT for minimum of 1 year.  Diuresis with elevated EDP.  An echo Doppler study will be obtained in a.m.  Patient should be started on low-dose ACE inhibition, carvedilol, probable nitrate therapy, and high potency statin treatment.   TTE: 10/06/17  Study Conclusions  - Left ventricle: The cavity size was normal. There was mild focal   basal hypertrophy of the septum. Mid to apical   inferoseptal/anteroseptal akinesis, apical lateral akinesis, mid   to apical anterior akinesis, apical inferior akinesis, akinesis   of the true apex. The estimated ejection fraction was 30%.   Features are consistent with a pseudonormal left ventricular   filling pattern, with concomitant abnormal relaxation and   increased filling pressure (grade 2 diastolic dysfunction). - Aortic valve: Trileaflet; moderately calcified leaflets.   Sclerosis without stenosis. There was trivial regurgitation. - Mitral valve: Mildly calcified annulus. There was trivial   regurgitation. - Right ventricle: The cavity size was normal. Systolic function   was normal. - Pulmonary arteries: No complete TR doppler jet so unable to   estimate PA systolic pressure. - Inferior vena cava: The vessel was normal in size. The   respirophasic diameter changes were in the normal range (= 50%),   consistent with normal central venous pressure. - Pericardium, extracardiac: A trivial pericardial effusion was   identified.  Impressions:  - Normal LV size with mild focal basal septal hypertrophy. EF 30%   with wall motion abnormalities as noted above in LAD   distribution. Normal RV size and systolic function. Aortic  valve   sclerosis without significant stenosis.  Patient Profile     77 y.o. female with history of diabetes mellitus who presented with acute anterior myocardial infarction, starting 24-36 hours before presentation. LAD stenting performed. LAD flow was decreased postprocedure and there was evidence of microcirculatory obstruction. TIMI blush score 0.Severe hyponatremia of uncertain duration, not responding to diuresis for heart failure management.Worsening renal function for which nephrology is following.  Assessment & Plan    1. Late presenting MI:  DES to LAD late presentation EF 30% continue beta blocker DAT no chest pain   2. ICM: EF 30% on echo. Did struggle with oliguria post cath, but now seems to be diuresing well. No signs of overload on exam other than significantly diminished breath sounds at the bases consistent with the abdominal CT findings of bilateral pleural effusions.. I/O -7.3 L  No ACE given elevated Cr will recheck BMET in am   4. Acute renal failure: Cr 2.3  Will repeat in am   5. Anemia: stable likely related to acute blood thinners age and CRF stable Hct 31.3   6. Abd pain: biggest issue Cdiff pending  HIDA negative per GI no EGD with slow improvement    Possible d/c in am if continues to improve  Discussed care with daughter and grand daughter   Charlton Haws

## 2017-10-17 LAB — BASIC METABOLIC PANEL
ANION GAP: 12 (ref 5–15)
BUN: 23 mg/dL — AB (ref 6–20)
CALCIUM: 8.4 mg/dL — AB (ref 8.9–10.3)
CO2: 25 mmol/L (ref 22–32)
Chloride: 99 mmol/L — ABNORMAL LOW (ref 101–111)
Creatinine, Ser: 2.23 mg/dL — ABNORMAL HIGH (ref 0.44–1.00)
GFR calc Af Amer: 23 mL/min — ABNORMAL LOW (ref >60)
GFR, EST NON AFRICAN AMERICAN: 20 mL/min — AB (ref >60)
GLUCOSE: 166 mg/dL — AB (ref 65–99)
Potassium: 3.3 mmol/L — ABNORMAL LOW (ref 3.5–5.1)
Sodium: 136 mmol/L (ref 135–145)

## 2017-10-17 LAB — GLUCOSE, CAPILLARY
GLUCOSE-CAPILLARY: 139 mg/dL — AB (ref 65–99)
GLUCOSE-CAPILLARY: 162 mg/dL — AB (ref 65–99)
Glucose-Capillary: 180 mg/dL — ABNORMAL HIGH (ref 65–99)
Glucose-Capillary: 206 mg/dL — ABNORMAL HIGH (ref 65–99)
Glucose-Capillary: 137 mg/dL — ABNORMAL HIGH (ref 65–99)

## 2017-10-17 LAB — CBC
HCT: 32.6 % — ABNORMAL LOW (ref 36.0–46.0)
HEMOGLOBIN: 10.7 g/dL — AB (ref 12.0–15.0)
MCH: 27.4 pg (ref 26.0–34.0)
MCHC: 32.8 g/dL (ref 30.0–36.0)
MCV: 83.4 fL (ref 78.0–100.0)
Platelets: 399 10*3/uL (ref 150–400)
RBC: 3.91 MIL/uL (ref 3.87–5.11)
RDW: 14.6 % (ref 11.5–15.5)
WBC: 7.1 10*3/uL (ref 4.0–10.5)

## 2017-10-17 LAB — OCCULT BLOOD X 1 CARD TO LAB, STOOL: FECAL OCCULT BLD: NEGATIVE

## 2017-10-17 LAB — FOLATE RBC
Folate, Hemolysate: 375 ng/mL
Folate, RBC: 1276 ng/mL (ref >498)
Hematocrit: 29.4 % — ABNORMAL LOW (ref 34.0–46.6)

## 2017-10-17 LAB — BRAIN NATRIURETIC PEPTIDE: B NATRIURETIC PEPTIDE 5: 1448.2 pg/mL — AB (ref 0.0–100.0)

## 2017-10-17 MED ORDER — ISOSORBIDE MONONITRATE ER 30 MG PO TB24
15.0000 mg | ORAL_TABLET | Freq: Every day | ORAL | Status: DC
  Administered 2017-10-17 – 2017-10-19 (×3): 15 mg via ORAL
  Filled 2017-10-17 (×3): qty 1

## 2017-10-17 MED ORDER — HYDRALAZINE HCL 25 MG PO TABS
12.5000 mg | ORAL_TABLET | Freq: Three times a day (TID) | ORAL | Status: DC
  Administered 2017-10-17 (×2): 12.5 mg via ORAL
  Filled 2017-10-17 (×2): qty 1

## 2017-10-17 MED ORDER — POTASSIUM CHLORIDE CRYS ER 20 MEQ PO TBCR
40.0000 meq | EXTENDED_RELEASE_TABLET | Freq: Once | ORAL | Status: AC
  Administered 2017-10-17: 40 meq via ORAL
  Filled 2017-10-17: qty 2

## 2017-10-17 NOTE — Progress Notes (Signed)
Physical Therapy Treatment Patient Details Name: Sheila White MRN: [id number] DOB: [birth year] Today's Date: 10/17/2017    History of Present Illness Patient is a 77 y.o. F with significant PMH of diabetes mellitus who presented to ED with chest pain and found to have an acute anterior myocardial infarction. Treated with angioplasty and stent. Devleoped post infarction pericarditis and hyponatremia.     PT Comments    Patient is progressing well towards their physical therapy goals. No complaints of abdominal pain. Session focused on gait and functional strength training. Patient with improved control of RW this session with maintaining a straight path and turning; required no additional assistance for pushing RW. However, still requires repeated verbal cueing for maintaining good RW proximity to allow for upright posture and improved balance. Trialed gait without RW and patient with increased unsteadiness and very narrow BOS. Recommending RW for home. Instructed patient and patient daughter on activity recommendations and pacing but suspect it will need further reinforcement.     Follow Up Recommendations  Home health PT;Supervision/Assistance - 24 hour     Equipment Recommendations  Rolling walker with 5" wheels;3in1 (PT)    Recommendations for Other Services       Precautions / Restrictions Precautions Precautions: Fall Restrictions Weight Bearing Restrictions: No    Mobility  Bed Mobility               General bed mobility comments: OOB in recliner  Transfers Overall transfer level: Needs assistance Equipment used: Rolling walker (2 wheeled);None Transfers: Sit to/from Stand Sit to Stand: Min guard         General transfer comment: Needs frequent VC's for hand placement. Instructed patient daughter on proper hand placement for transfers.  Ambulation/Gait Ambulation/Gait assistance: Min guard;Min assist Ambulation Distance (Feet): 110 Feet Assistive device:  Rolling walker (2 wheeled);None Gait Pattern/deviations: Decreased stride length;Narrow base of support;Shuffle Gait velocity: decreased   General Gait Details: Patient requiring min guard for ambulation with RW x 100 feet and min assist for ambulation with no device x 10 feet. Patient displays narrower BOS, increased unsteadiness, and decreased stride length with no AD compared to with RW. Mod VC's for RW proximity and upright posture (looking up).  Required one standing rest break.     Stairs             Wheelchair Mobility    Modified Rankin (Stroke Patients Only)       Balance Overall balance assessment: Needs assistance Sitting-balance support: No upper extremity supported;Feet supported Sitting balance-Leahy Scale: Good     Standing balance support: No upper extremity supported;During functional activity Standing balance-Leahy Scale: Fair Standing balance comment: Needs additional assistance for dynamic balance                            Cognition Arousal/Alertness: Awake/alert Behavior During Therapy: WFL for tasks assessed/performed Overall Cognitive Status: Within Functional Limits for tasks assessed                                        Exercises General Exercises - Lower Extremity Heel Slides: 5 reps;Both;Seated Other Exercises Other Exercises: 5 sit to stands with BUE     General Comments General comments (skin integrity, edema, etc.): HR 83-91 bpm      Pertinent Vitals/Pain Pain Assessment: No/denies pain    Home Living  Prior Function            PT Goals (current goals can now be found in the care plan section) Acute Rehab PT Goals Patient Stated Goal: Be able to go to church PT Goal Formulation: With patient Time For Goal Achievement: 10/26/17 Potential to Achieve Goals: Good Progress towards PT goals: Progressing toward goals    Frequency    Min 3X/week      PT Plan  Current plan remains appropriate    Co-evaluation              AM-PAC PT "6 Clicks" Daily Activity  Outcome Measure  Difficulty turning over in bed (including adjusting bedclothes, sheets and blankets)?: A Little Difficulty moving from lying on back to sitting on the side of the bed? : A Little Difficulty sitting down on and standing up from a chair with arms (e.g., wheelchair, bedside commode, etc,.)?: A Little Help needed moving to and from a bed to chair (including a wheelchair)?: A Little Help needed walking in hospital room?: A Little Help needed climbing 3-5 steps with a railing? : A Little 6 Click Score: 18    End of Session Equipment Utilized During Treatment: Gait belt Activity Tolerance: Patient tolerated treatment well Patient left: in chair;with call bell/phone within reach;with family/visitor present;with nursing/sitter in room Nurse Communication: Mobility status PT Visit Diagnosis: Unsteadiness on feet (R26.81);Muscle weakness (generalized) (M62.81);Difficulty in walking, not elsewhere classified (R26.2)     Time: 1320-1340 PT Time Calculation (min) (ACUTE ONLY): 20 min  Charges:  $Gait Training: 8-22 mins                    G Codes:      Laurina Bustlearoline Letty Salvi, PT, DPT Acute Rehabilitation Services  Pager: [phone]    Vanetta MuldersCarloine H Maddison Kilner 10/17/2017, 1:48 PM

## 2017-10-17 NOTE — Progress Notes (Addendum)
Progress Note  Patient Name: Sheila White Date of Encounter: 10/17/2017  Primary Cardiologist: New to [facility]   Subjective    Pt seems to be doing well this AM. Up in chair without obvious complaints. Family at bedside to help with interpretation. Will order labs to further assessment.   Inpatient Medications    Scheduled Meds: . aspirin  81 mg Oral Daily  . atorvastatin  80 mg Oral q1800  . colchicine  0.6 mg Oral Daily  . docusate sodium  100 mg Oral Daily  . furosemide  40 mg Oral Daily  . insulin aspart  0-5 Units Subcutaneous QHS  . insulin aspart  0-9 Units Subcutaneous TID WC  . metoprolol tartrate  12.5 mg Oral BID  . pantoprazole  40 mg Oral BID  . polyethylene glycol  17 g Oral Daily  . sodium chloride flush  3 mL Intravenous Q12H  . sucralfate  1 g Oral Q6H  . ticagrelor  90 mg Oral BID   Continuous Infusions: . sodium chloride     PRN Meds: sodium chloride, acetaminophen, diazepam, iopamidol, morphine injection, nitroGLYCERIN, ondansetron (ZOFRAN) IV, pneumococcal 23 valent vaccine, sodium chloride flush, zolpidem   Vital Signs    Vitals:   10/16/17 1943 10/17/17 0000 10/17/17 0419 10/17/17 1007  BP: 104/65 109/81 130/70 (!) 121/57  Pulse: 80 75 87 83  Resp: 18 14 18  (!) 24  Temp: 98.2 F (36.8 C) 97.9 F (36.6 C) 97.7 F (36.5 C) 97.8 F (36.6 C)  TempSrc: Oral Oral Oral Oral  SpO2: 99% 99% 97% 95%  Weight:   123 lb 14.4 oz (56.2 kg)   Height:        Intake/Output Summary (Last 24 hours) at 10/17/2017 1120 Last data filed at 10/17/2017 0420 Gross per 24 hour  Intake 100 ml  Output 900 ml  Net -800 ml   Filed Weights   10/15/17 0521 10/16/17 0525 10/17/17 0419  Weight: 121 lb 4.1 oz (55 kg) 117 lb 8.1 oz (53.3 kg) 123 lb 14.4 oz (56.2 kg)    Physical Exam   GEN: Well nourished, well developed, in no acute distress.  HEENT: Grossly normal.  Neck: Supple, no JVD, carotid bruits, or masses. Cardiac: RRR, no murmurs, rubs, or gallops. No  clubbing, cyanosis, edema.  Radials/DP/PT 2+ and equal bilaterally.  Respiratory:  Respirations regular and unlabored, clear to auscultation bilaterally. GI: Soft, nontender, nondistended, BS + x 4. MS: no deformity or atrophy. Skin: warm and dry, no rash. Neuro:  Strength and sensation are intact. Psych: AAOx3.  Normal affect.  Labs    Chemistry Recent Labs  Lab 10/12/17 0309 10/13/17 0313 10/14/17 0228  NA 130* 128* 132*  K 3.7 4.0 3.7  CL 102 98* 98*  CO2 18* 21* 22  GLUCOSE 151* 123* 125*  BUN 32* 25* 24*  CREATININE 2.80* 2.45* 2.30*  CALCIUM 8.0* 8.2* 8.4*  PROT  --   --  6.0*  ALBUMIN  --   --  2.7*  AST  --   --  25  ALT  --   --  17  ALKPHOS  --   --  88  BILITOT  --   --  0.6  GFRNONAA 15* 18* 19*  GFRAA 17* 20* 22*  ANIONGAP 10 9 12      Hematology Recent Labs  Lab 10/12/17 0309 10/13/17 0313 10/14/17 1339  WBC 7.2 6.7 6.5  RBC 3.19* 3.40* 3.78*  HGB 8.9* 9.3* 10.4*  HCT 26.9* 28.6* 31.3*  MCV 84.3 84.1 82.8  MCH 27.9 27.4 27.5  MCHC 33.1 32.5 33.2  RDW 14.3 14.0 14.4  PLT 410* 395 427*    Cardiac Enzymes Recent Labs  Lab 10/14/17 0858  TROPONINI 0.42*   No results for input(s): TROPIPOC in the last 168 hours.   BNP Recent Labs  Lab 10/14/17 0858  BNP 2,856.8*    Lipid Panel     Component Value Date/Time   CHOL 161 10/06/2017 0256   TRIG 110 10/06/2017 0256   HDL 49 10/06/2017 0256   CHOLHDL 3.3 10/06/2017 0256   VLDL 22 10/06/2017 0256   LDLCALC 90 10/06/2017 0256   DDimer No results for input(s): DDIMER in the last 168 hours.   Radiology    No results found.  Telemetry    10/17/17 NSR with HR 78 - Personally Reviewed  ECG    10/17/17 No new tracings as of today - Personally Reviewed  Cardiac Studies   Cath: 10/05/17  Conclusion     Mid LAD lesion is 90% stenosed.  Prox LAD lesion is 99% stenosed.  Dist LAD lesion is 99% stenosed.  1st Mrg lesion is 95% stenosed.  Ost 2nd Mrg to 2nd Mrg lesion is 30%  stenosed.  Ost RPDA to RPDA lesion is 30% stenosed.  Post intervention, there is a 0% residual stenosis.  Post intervention, there is a 0% residual stenosis.  Post intervention, there is a 35% residual stenosis.  A stent was successfully placed.  A stent was successfully placed.  Acute late presentation anterior ST segment elevation myocardial infarction with demonstration of subtotal long proximal LAD stenosis, 90% mid stenosis, and 99% apical stenosis with reduced apical flow.  Left circumflex disease with 50% proximal OM1 stenosis followed by distal 95% marginal stenosis prior to giving off distal branches in the OM1 vessel.  Mild diffuse irregularity of a large dominant RCA with 30% narrowing in the PDA vessel.  LVEDP 34 mmHg/  Successful PCI to the LAD with ultimate insertion of a 2.526 mm Resolute DES stent postdilated to 2.8 mm proximally and 2.68 mm distally with the 99% stenosis being reduced to 0%; DES stenting of the mid 90% stenosis with ultimate insertion of a 2.2512 mm Resolute DES stent with residual narrowing at 0%, and initial 99% apical thrombotic stenosis with reduced flow treated with low-level PTCA and intracoronary verapamil with improvement to approximately 35-40% in a very small apical LAD segment.  RECOMMENDATION: DAPT for minimum of 1 year. Diuresis with elevated EDP. An echo Doppler study will be obtained in a.m. Patient should be started on low-dose ACE inhibition, carvedilol, probable nitrate therapy, and high potency statin treatment.   TTE: 10/06/17  Study Conclusions  - Left ventricle: The cavity size was normal. There was mild focal basal hypertrophy of the septum. Mid to apical inferoseptal/anteroseptal akinesis, apical lateral akinesis, mid to apical anterior akinesis, apical inferior akinesis, akinesis of the true apex. The estimated ejection fraction was 30%. Features are consistent with a pseudonormal left  ventricular filling pattern, with concomitant abnormal relaxation and increased filling pressure (grade 2 diastolic dysfunction). - Aortic valve: Trileaflet; moderately calcified leaflets. Sclerosis without stenosis. There was trivial regurgitation. - Mitral valve: Mildly calcified annulus. There was trivial regurgitation. - Right ventricle: The cavity size was normal. Systolic function was normal. - Pulmonary arteries: No complete TR doppler jet so unable to estimate PA systolic pressure. - Inferior vena cava: The vessel was normal in size. The respirophasic diameter changes  were in the normal range (= 50%), consistent with normal central venous pressure. - Pericardium, extracardiac: A trivial pericardial effusion was identified.  Impressions:  - Normal LV size with mild focal basal septal hypertrophy. EF 30% with wall motion abnormalities as noted above in LAD distribution. Normal RV size and systolic function. Aortic valve sclerosis without significant stenosis.  Patient Profile     77 y.o. female with history of diabetes mellitus who presented with acute anterior myocardial infarction, starting 24-36 hours before presentation. LAD stenting performed.Severe hyponatremia of uncertain duration, not responding to diuresis for heart failure management.Worsening renal function for which nephrology is following.  Assessment & Plan    1. Late presenting MI:   -Successful DES to LAD after late MI presentation 10/05/17 -LVEF 30% per echocardiogram with G2 DD -Continue beta blocker and DAT  -Denies chest pain>>however somewhat difficult to determine secondary to language barrier. Son at bedside who says she is having c/o of coughing but no chest pain  -Trop downtrending from peak at 1.58>>>0.42 today -ASA, Brilinta, statin -Will add hydralazine and nitrates given new low EF and inability to add ACE-I/ARB secondary to worsening renal function   2. ICM:   -EF 30% with GR DD per echo.  -Did struggle with oliguria post cath, but now seems to be diuresing well.  -No signs of overload on exam other than significantly diminished breath sounds at the bases -BNP was severely elevated at 2,856, consistent with the abdominal CT findings of bilateral pleural effusions.. -Weight, 123lb today, was 117 yesterday. Admission weight is 129lb.  -I&O, net negative 8.5L since admission, total 24H output is 900ml  -No ACE given elevated Cr  -Follow BMET closely  -Lasix 40 PO QD  -Will add hydralazine and nitrates given new dysfunction -Will repeat BNP for further assessment   4. Acute renal failure:  -Cr up to 2.3 on 10/14/17, improved since 10/13/17 at 2.45 -Baseline appears to be 1.2 range. She has been as high as 3.86 this admission -Appreciate nephrology input  -Will obtain BMET now and in AM   5. Anemia:  -Will obtain CBC for better assessment of current picture   6. Abd pain:  - Denies abdominal pain today  -Cdiff negative -HIDA negative per GI with no recs for EGD    Signed, Georgie ChardJill McDaniel NP-C [facility] Pager: [phone] 10/17/2017, 11:20 AM      Patient seen and examined. Agree with assessment and plan. No recurrent chest pain. C/O cough. No labs since 4/19.  Will re-check Bmet, BNP , CBC today.  Decreased BS without rales. No edema.  With renal insufficieny and reduced LV fxn seconday to late presentation anterior MI, add low dose nitrates/hydralazine since not a candidate yet for ACE-I/ARB or aldosterone blockade.    Lennette Biharihomas A. Kelly, MD, [facility] Regional [facility] CenterFACC 10/17/2017 11:53 AM   For questions or updates, please contact   Please consult [email or web address] for contact info under Cardiology/STEMI.

## 2017-10-17 NOTE — Progress Notes (Signed)
CARDIAC REHAB PHASE I   PRE:  Rate/Rhythm: 88 SR    BP: sitting 136/77    SaO2: 98 RA  MODE:  Ambulation: 160 ft   POST:  Rate/Rhythm: 103    BP: sitting 103/72     SaO2: 98 RA  Pt denied abdominal pain but now has a significant dry cough when sitting/lying.  She sts this has been present since yesterday. She was able to get up and walk with RW, gait belt, assist x1 (another assist following with rollator). Fairly steady but needs reminders to stay close to RW. BP lower after walk but denied dizziness or weakness. Sts she is just tired after walking. Son present but does not speak [place]. She still has a foley. Will f/u later if for d/c but does not seem quite ready.  [id number]  Sheila MassonRandi Kristan Viren White CES, ACSM 10/17/2017 9:06 AM

## 2017-10-18 ENCOUNTER — Inpatient Hospital Stay (HOSPITAL_COMMUNITY): Payer: Medicaid Other

## 2017-10-18 DIAGNOSIS — R0609 Other forms of dyspnea: Secondary | ICD-10-CM

## 2017-10-18 LAB — BASIC METABOLIC PANEL
Anion gap: 11 (ref 5–15)
BUN: 26 mg/dL — AB (ref 6–20)
CALCIUM: 7.6 mg/dL — AB (ref 8.9–10.3)
CHLORIDE: 101 mmol/L (ref 101–111)
CO2: 21 mmol/L — AB (ref 22–32)
CREATININE: 2.19 mg/dL — AB (ref 0.44–1.00)
GFR calc non Af Amer: 20 mL/min — ABNORMAL LOW (ref >60)
GFR, EST AFRICAN AMERICAN: 23 mL/min — AB (ref >60)
GLUCOSE: 157 mg/dL — AB (ref 65–99)
Potassium: 3.3 mmol/L — ABNORMAL LOW (ref 3.5–5.1)
Sodium: 133 mmol/L — ABNORMAL LOW (ref 135–145)

## 2017-10-18 LAB — GLUCOSE, CAPILLARY
Glucose-Capillary: 150 mg/dL — ABNORMAL HIGH (ref 65–99)
Glucose-Capillary: 176 mg/dL — ABNORMAL HIGH (ref 65–99)
Glucose-Capillary: 152 mg/dL — ABNORMAL HIGH (ref 65–99)
Glucose-Capillary: 145 mg/dL — ABNORMAL HIGH (ref 65–99)

## 2017-10-18 MED ORDER — LORATADINE 10 MG PO TABS
10.0000 mg | ORAL_TABLET | Freq: Every day | ORAL | Status: DC
  Administered 2017-10-18 – 2017-10-24 (×7): 10 mg via ORAL
  Filled 2017-10-18 (×7): qty 1

## 2017-10-18 MED ORDER — BENZONATATE 100 MG PO CAPS
200.0000 mg | ORAL_CAPSULE | Freq: Three times a day (TID) | ORAL | Status: DC | PRN
  Administered 2017-10-19 – 2017-10-23 (×9): 200 mg via ORAL
  Filled 2017-10-18 (×9): qty 2

## 2017-10-18 MED ORDER — GUAIFENESIN-DM 100-10 MG/5ML PO SYRP
5.0000 mL | ORAL_SOLUTION | ORAL | Status: DC | PRN
  Administered 2017-10-18 – 2017-10-22 (×9): 5 mL via ORAL
  Filled 2017-10-18 (×9): qty 5

## 2017-10-18 MED ORDER — FLUTICASONE PROPIONATE 50 MCG/ACT NA SUSP
2.0000 | Freq: Every day | NASAL | Status: DC
  Administered 2017-10-18 – 2017-10-23 (×6): 2 via NASAL
  Filled 2017-10-18 (×2): qty 16

## 2017-10-18 MED ORDER — POTASSIUM CHLORIDE CRYS ER 20 MEQ PO TBCR
40.0000 meq | EXTENDED_RELEASE_TABLET | Freq: Once | ORAL | Status: AC
  Administered 2017-10-18: 40 meq via ORAL
  Filled 2017-10-18: qty 2

## 2017-10-18 MED ORDER — HYDRALAZINE HCL 25 MG PO TABS
12.5000 mg | ORAL_TABLET | Freq: Two times a day (BID) | ORAL | Status: DC
  Administered 2017-10-19: 12.5 mg via ORAL
  Filled 2017-10-18 (×3): qty 1

## 2017-10-18 NOTE — Progress Notes (Signed)
CARDIAC REHAB PHASE I   PRE:  Rate/Rhythm: 85 SR    BP: sitting 105/61    SaO2: 100 RA  MODE:  Ambulation: 340 ft   POST:  Rate/Rhythm: 100 ST    BP: sitting 133/63     SaO2: 96 RA  Pt still with dry cough today. Able to walk with RW. Kept closer to RW today, only needing correction to step closer toward end of walk when she was getting tired. Denied any problems walking. She did not cough while walking but as soon as she sat down in recliner, she started coughing again.  [phone]    Harriet MassonRandi Kristan Hadyn Blanck CES, ACSM 10/18/2017 12:00 PM

## 2017-10-18 NOTE — Progress Notes (Addendum)
Progress Note  Patient Name: Sheila White Date of Encounter: 10/18/2017  Primary Cardiologist: New to [facility]  Subjective   Pt with c/o dry cough. Will have her ambulate again today and take foley catheter out to encourage her. Denies CP. BP soft this AM>>hydralazine held   Inpatient Medications    Scheduled Meds: . aspirin  81 mg Oral Daily  . atorvastatin  80 mg Oral q1800  . colchicine  0.6 mg Oral Daily  . docusate sodium  100 mg Oral Daily  . furosemide  40 mg Oral Daily  . hydrALAZINE  12.5 mg Oral Q8H  . insulin aspart  0-5 Units Subcutaneous QHS  . insulin aspart  0-9 Units Subcutaneous TID WC  . isosorbide mononitrate  15 mg Oral Daily  . metoprolol tartrate  12.5 mg Oral BID  . pantoprazole  40 mg Oral BID  . polyethylene glycol  17 g Oral Daily  . sodium chloride flush  3 mL Intravenous Q12H  . sucralfate  1 g Oral Q6H  . ticagrelor  90 mg Oral BID   Continuous Infusions: . sodium chloride     PRN Meds: sodium chloride, acetaminophen, diazepam, guaiFENesin-dextromethorphan, iopamidol, morphine injection, nitroGLYCERIN, ondansetron (ZOFRAN) IV, pneumococcal 23 valent vaccine, sodium chloride flush, zolpidem   Vital Signs    Vitals:   10/18/17 0027 10/18/17 0506 10/18/17 0520 10/18/17 0804  BP:   99/83 114/80  Pulse:   92 91  Resp:   (!) 25   Temp: 98.6 F (37 C)  97.9 F (36.6 C) 98.6 F (37 C)  TempSrc: Oral  Oral Oral  SpO2:   100%   Weight:  124 lb 5.4 oz (56.4 kg)    Height:        Intake/Output Summary (Last 24 hours) at 10/18/2017 0826 Last data filed at 10/18/2017 0500 Gross per 24 hour  Intake 180 ml  Output 651 ml  Net -471 ml   Filed Weights   10/16/17 0525 10/17/17 0419 10/18/17 0506  Weight: 117 lb 8.1 oz (53.3 kg) 123 lb 14.4 oz (56.2 kg) 124 lb 5.4 oz (56.4 kg)    Physical Exam   General: Frail, elderly, spanish-speaking, NAD Skin: Warm, dry, intact  Head: Normocephalic, atraumatic, clear, moist mucus membranes. Neck:  Negative for carotid bruits. No JVD Lungs: Diminished in lower lobes. Clear to ausculation bilaterally. No wheezes, rales, or rhonchi. Breathing is unlabored. Cardiovascular: RRR with S1 S2. No murmurs, rubs or gallops Abdomen: Soft, non-tender, non-distended with normoactive bowel sounds. No obvious abdominal masses. MSK: Strength and tone appear normal for age. 5/5 in all extremities Extremities: No edema. No clubbing or cyanosis. DP/PT pulses 2+ bilaterally Neuro: Alert and oriented. No focal deficits. No facial asymmetry. MAE spontaneously. Psych: Responds to questions appropriately with normal affect.    Labs    Chemistry Recent Labs  Lab 10/14/17 0228 10/17/17 1200 10/18/17 0349  NA 132* 136 133*  K 3.7 3.3* 3.3*  CL 98* 99* 101  CO2 22 25 21*  GLUCOSE 125* 166* 157*  BUN 24* 23* 26*  CREATININE 2.30* 2.23* 2.19*  CALCIUM 8.4* 8.4* 7.6*  PROT 6.0*  --   --   ALBUMIN 2.7*  --   --   AST 25  --   --   ALT 17  --   --   ALKPHOS 88  --   --   BILITOT 0.6  --   --   GFRNONAA 19* 20* 20*  GFRAA 22*  23* 23*  ANIONGAP 12 12 11      Hematology Recent Labs  Lab 10/13/17 0313 10/14/17 0228 10/14/17 1339 10/17/17 1200  WBC 6.7  --  6.5 7.1  RBC 3.40*  --  3.78* 3.91  HGB 9.3*  --  10.4* 10.7*  HCT 28.6* 29.4* 31.3* 32.6*  MCV 84.1  --  82.8 83.4  MCH 27.4  --  27.5 27.4  MCHC 32.5  --  33.2 32.8  RDW 14.0  --  14.4 14.6  PLT 395  --  427* 399    Cardiac Enzymes Recent Labs  Lab 10/14/17 0858  TROPONINI 0.42*   No results for input(s): TROPIPOC in the last 168 hours.   BNP Recent Labs  Lab 10/14/17 0858 10/17/17 1200  BNP 2,856.8* 1,448.2*     DDimer No results for input(s): DDIMER in the last 168 hours.   Radiology    No results found.  Telemetry    10/18/17 NSR  - Personally Reviewed  ECG    No new tracings as of 10/18/17 - Personally Reviewed  Cardiac Studies   Cath: 10/05/17  Conclusion     Mid LAD lesion is 90%  stenosed.  Prox LAD lesion is 99% stenosed.  Dist LAD lesion is 99% stenosed.  1st Mrg lesion is 95% stenosed.  Ost 2nd Mrg to 2nd Mrg lesion is 30% stenosed.  Ost RPDA to RPDA lesion is 30% stenosed.  Post intervention, there is a 0% residual stenosis.  Post intervention, there is a 0% residual stenosis.  Post intervention, there is a 35% residual stenosis.  A stent was successfully placed.  A stent was successfully placed.  Acute late presentation anterior ST segment elevation myocardial infarction with demonstration of subtotal long proximal LAD stenosis, 90% mid stenosis, and 99% apical stenosis with reduced apical flow.  Left circumflex disease with 50% proximal OM1 stenosis followed by distal 95% marginal stenosis prior to giving off distal branches in the OM1 vessel.  Mild diffuse irregularity of a large dominant RCA with 30% narrowing in the PDA vessel.  LVEDP 34 mmHg/  Successful PCI to the LAD with ultimate insertion of a 2.526 mm Resolute DES stent postdilated to 2.8 mm proximally and 2.68 mm distally with the 99% stenosis being reduced to 0%; DES stenting of the mid 90% stenosis with ultimate insertion of a 2.2512 mm Resolute DES stent with residual narrowing at 0%, and initial 99% apical thrombotic stenosis with reduced flow treated with low-level PTCA and intracoronary verapamil with improvement to approximately 35-40% in a very small apical LAD segment.  RECOMMENDATION: DAPT for minimum of 1 year. Diuresis with elevated EDP. An echo Doppler study will be obtained in a.m. Patient should be started on low-dose ACE inhibition, carvedilol, probable nitrate therapy, and high potency statin treatment.   TTE: 10/06/17  Study Conclusions  - Left ventricle: The cavity size was normal. There was mild focal basal hypertrophy of the septum. Mid to apical inferoseptal/anteroseptal akinesis, apical lateral akinesis, mid to apical anterior akinesis, apical  inferior akinesis, akinesis of the true apex. The estimated ejection fraction was 30%. Features are consistent with a pseudonormal left ventricular filling pattern, with concomitant abnormal relaxation and increased filling pressure (grade 2 diastolic dysfunction). - Aortic valve: Trileaflet; moderately calcified leaflets. Sclerosis without stenosis. There was trivial regurgitation. - Mitral valve: Mildly calcified annulus. There was trivial regurgitation. - Right ventricle: The cavity size was normal. Systolic function was normal. - Pulmonary arteries: No complete TR doppler jet so unable  to estimate PA systolic pressure. - Inferior vena cava: The vessel was normal in size. The respirophasic diameter changes were in the normal range (= 50%), consistent with normal central venous pressure. - Pericardium, extracardiac: A trivial pericardial effusion was identified.  Impressions:  - Normal LV size with mild focal basal septal hypertrophy. EF 30% with wall motion abnormalities as noted above in LAD distribution. Normal RV size and systolic function. Aortic valve sclerosis without significant stenosis.  Patient Profile     77 y.o. female  with history of diabetes mellitus who presented with acute anterior myocardial infarction, starting 24-36 hours before presentation. LAD stenting performed.Severe hyponatremia of uncertain duration, not responding to diuresis for heart failure management.Worsening renal function for which nephrology is following.  Assessment & Plan    1. Late presenting MI:  -Successful DES to LAD after late MI presentation 10/05/17 -LVEF 30% per echocardiogram with G2 DD -Continues to have c/o dry cough -Trop downtrending from peak at 1.58>>>0.42 today -ASA, Brilinta, statin -Continue beta blocker and DAT>>>IMDUR and hydralazine added 10/17/17. BP soft this AM. May need to decrease dose to 12.5mg  BID   2. ICM: -EF 30% with GR  DD per echo  -No signs of overload on exam  -BNP was severely elevated at 2,856, consistent with the abdominal CT findings of bilateral pleural effusions.. -Weight, 124lb today, was 123lb yesterday. Admission weight is 129lb.  -I&O, net negative 8.8L since admission, total 24H output is  -No ACE given elevated Cr  -Follow BMET closely  -Lasix 40 PO QD -Hydralazine and nitrates added given new dysfunction -BNP remains elevated however improved from 10/14/17. 1, 448 yesterday    4. Acute renal failure:  -Cr up to 2.19on 10/18/17, improved from 2.23 yesterday  -Baseline appears to be 1.2 range. She has been as high as 3.86 this admission -Appreciate nephrology input   5. Anemia:  -Hb-10.7, HCT-32.6  6. Abd pain: -Denies abdominal pain today  -Cdiff negative -HIDA negative per GIwith no recs for EGD   7. Hypokalemia: -K+, 3.3 today>>>replaced with KDUR    Signed, Georgie Chard NP-C [facility] Pager: [phone] 10/18/2017, 8:26 AM     Patient seen and examined. Agree with assessment and plan. No recurrent chest pain. I/O -[phone] since admission. BNP improved but still elevated at 1448. Still with cough. Will check PA and lat CXR.  Nitrates/hydralazine started but will reduce hydralazine to BID with low BP, and titrate as BP allows. DC foley.   Lennette Bihari, MD, [facility] 10/18/2017 12:47 PM  For questions or updates, please contact   Please consult [email or web address] for contact info under Cardiology/STEMI.

## 2017-10-19 LAB — GLUCOSE, CAPILLARY
GLUCOSE-CAPILLARY: 218 mg/dL — AB (ref 65–99)
GLUCOSE-CAPILLARY: 184 mg/dL — AB (ref 65–99)
Glucose-Capillary: 130 mg/dL — ABNORMAL HIGH (ref 65–99)
Glucose-Capillary: 170 mg/dL — ABNORMAL HIGH (ref 65–99)

## 2017-10-19 LAB — BASIC METABOLIC PANEL
ANION GAP: 12 (ref 5–15)
BUN: 24 mg/dL — ABNORMAL HIGH (ref 6–20)
CALCIUM: 7.8 mg/dL — AB (ref 8.9–10.3)
CO2: 21 mmol/L — AB (ref 22–32)
Chloride: 97 mmol/L — ABNORMAL LOW (ref 101–111)
Creatinine, Ser: 2.15 mg/dL — ABNORMAL HIGH (ref 0.44–1.00)
GFR calc non Af Amer: 21 mL/min — ABNORMAL LOW (ref >60)
GFR, EST AFRICAN AMERICAN: 24 mL/min — AB (ref >60)
Glucose, Bld: 147 mg/dL — ABNORMAL HIGH (ref 65–99)
POTASSIUM: 4.3 mmol/L (ref 3.5–5.1)
Sodium: 130 mmol/L — ABNORMAL LOW (ref 135–145)

## 2017-10-19 MED ORDER — ISOSORBIDE MONONITRATE ER 30 MG PO TB24
30.0000 mg | ORAL_TABLET | Freq: Every day | ORAL | Status: DC
  Administered 2017-10-20 – 2017-10-22 (×3): 30 mg via ORAL
  Filled 2017-10-19 (×3): qty 1

## 2017-10-19 MED ORDER — CARVEDILOL 3.125 MG PO TABS
3.1250 mg | ORAL_TABLET | Freq: Two times a day (BID) | ORAL | Status: DC
  Administered 2017-10-19 – 2017-10-20 (×2): 3.125 mg via ORAL
  Filled 2017-10-19 (×2): qty 1

## 2017-10-19 MED ORDER — HYDRALAZINE HCL 25 MG PO TABS
12.5000 mg | ORAL_TABLET | Freq: Three times a day (TID) | ORAL | Status: DC
  Administered 2017-10-19 – 2017-10-22 (×5): 12.5 mg via ORAL
  Filled 2017-10-19 (×6): qty 1

## 2017-10-19 NOTE — Progress Notes (Signed)
CARDIAC REHAB PHASE I   PRE:  Rate/Rhythm: 88 SR    BP: sitting 94/67    SaO2:   MODE:  Ambulation: 260 ft   POST:  Rate/Rhythm: 96 SR    BP: sitting 103/62     SaO2:   Pt very reluctant to walk. She doesn't feel well, c/o constant cough, not sleeping, not eating. She does not have an appetite. She finally agreed to walking and she does not cough while walking (but starts back as soon as she sits). Pt weak, c/o dizziness walking. BP low but stable. She needed many reminders to stay close to RW today.  Not as good today as yesterday. Will f/u tomorrow. [phone]  Sheila MassonRandi Kristan Ketrick White CES, ACSM 10/19/2017 1:51 PM

## 2017-10-19 NOTE — Progress Notes (Signed)
Progress Note  Patient Name: Sheila White Date of Encounter: 10/19/2017  Primary Cardiologist: new, [facility]  Subjective   No chest pain; cough better but still present  Inpatient Medications    Scheduled Meds: . aspirin  81 mg Oral Daily  . atorvastatin  80 mg Oral q1800  . colchicine  0.6 mg Oral Daily  . docusate sodium  100 mg Oral Daily  . fluticasone  2 spray Each Nare Daily  . furosemide  40 mg Oral Daily  . hydrALAZINE  12.5 mg Oral BID  . insulin aspart  0-5 Units Subcutaneous QHS  . insulin aspart  0-9 Units Subcutaneous TID WC  . isosorbide mononitrate  15 mg Oral Daily  . loratadine  10 mg Oral Daily  . metoprolol tartrate  12.5 mg Oral BID  . pantoprazole  40 mg Oral BID  . polyethylene glycol  17 g Oral Daily  . sodium chloride flush  3 mL Intravenous Q12H  . sucralfate  1 g Oral Q6H  . ticagrelor  90 mg Oral BID   Continuous Infusions: . sodium chloride     PRN Meds: sodium chloride, acetaminophen, benzonatate, diazepam, guaiFENesin-dextromethorphan, iopamidol, morphine injection, nitroGLYCERIN, ondansetron (ZOFRAN) IV, pneumococcal 23 valent vaccine, sodium chloride flush, zolpidem   Vital Signs    Vitals:   10/18/17 1944 10/18/17 2346 10/19/17 0544 10/19/17 0817  BP: 111/63 114/70 128/68 132/79  Pulse:  89  100  Resp: 13 15 20 18   Temp: 98.7 F (37.1 C) 98.4 F (36.9 C) 98.3 F (36.8 C) 98.4 F (36.9 C)  TempSrc: Oral Oral Oral Oral  SpO2: 100% 100% 99% 100%  Weight:   123 lb 12.8 oz (56.2 kg)   Height:        Intake/Output Summary (Last 24 hours) at 10/19/2017 1146 Last data filed at 10/19/2017 1000 Gross per 24 hour  Intake 360 ml  Output 2 ml  Net 358 ml    I/O since admission: -8264  Filed Weights   10/17/17 0419 10/18/17 0506 10/19/17 0544  Weight: 123 lb 14.4 oz (56.2 kg) 124 lb 5.4 oz (56.4 kg) 123 lb 12.8 oz (56.2 kg)    Telemetry    Sinus - Personally Reviewed  ECG    ECG (independently read by me): 10/13/2017:  NSR  at 80; QS V1-6 with T inversion. Low voltage.  Will obtain F/u ECG today.  Physical Exam   BP 132/79 (BP Location: Left Arm)   Pulse 100   Temp 98.4 F (36.9 C) (Oral)   Resp 18   Ht 5' (1.524 m)   Wt 123 lb 12.8 oz (56.2 kg)   SpO2 100%   BMI 24.18 kg/m  General: Alert, oriented, no distress.  Skin: normal turgor, no rashes, warm and dry HEENT: Normocephalic, atraumatic. Pupils equal round and reactive to light; sclera anicteric; extraocular muscles intact;  Nose without nasal septal hypertrophy Mouth/Parynx benign; Mallinpatti scale 3 Neck: No JVD, no carotid bruits; normal carotid upstroke Lungs: decreased BS at basesno wheezing or rales Chest wall: without tenderness to palpitation Heart: PMI not displaced, RRR, s1 s2 normal, 1/6 systolic murmur, no diastolic murmur, no rubs, gallops, thrills, or heaves Abdomen: soft, nontender; no hepatosplenomehaly, BS+; abdominal aorta nontender and not dilated by palpation. Back: no CVA tenderness Pulses 2+ Musculoskeletal: full range of motion, normal strength, no joint deformities Extremities: no clubbing cyanosis or edema, Homan's sign negative  Neurologic: grossly nonfocal; Cranial nerves grossly wnl Psychologic: Normal mood and affect   Labs  Chemistry Recent Labs  Lab 10/14/17 0228 10/17/17 1200 10/18/17 0349 10/19/17 0325  NA 132* 136 133* 130*  K 3.7 3.3* 3.3* 4.3  CL 98* 99* 101 97*  CO2 22 25 21* 21*  GLUCOSE 125* 166* 157* 147*  BUN 24* 23* 26* 24*  CREATININE 2.30* 2.23* 2.19* 2.15*  CALCIUM 8.4* 8.4* 7.6* 7.8*  PROT 6.0*  --   --   --   ALBUMIN 2.7*  --   --   --   AST 25  --   --   --   ALT 17  --   --   --   ALKPHOS 88  --   --   --   BILITOT 0.6  --   --   --   GFRNONAA 19* 20* 20* 21*  GFRAA 22* 23* 23* 24*  ANIONGAP 12 12 11 12      Hematology Recent Labs  Lab 10/13/17 0313 10/14/17 0228 10/14/17 1339 10/17/17 1200  WBC 6.7  --  6.5 7.1  RBC 3.40*  --  3.78* 3.91  HGB 9.3*  --  10.4*  10.7*  HCT 28.6* 29.4* 31.3* 32.6*  MCV 84.1  --  82.8 83.4  MCH 27.4  --  27.5 27.4  MCHC 32.5  --  33.2 32.8  RDW 14.0  --  14.4 14.6  PLT 395  --  427* 399    Cardiac Enzymes Recent Labs  Lab 10/14/17 0858  TROPONINI 0.42*   No results for input(s): TROPIPOC in the last 168 hours.   BNP Recent Labs  Lab 10/14/17 0858 10/17/17 1200  BNP 2,856.8* 1,448.2*     DDimer No results for input(s): DDIMER in the last 168 hours.   Lipid Panel     Component Value Date/Time   CHOL 161 10/06/2017 0256   TRIG 110 10/06/2017 0256   HDL 49 10/06/2017 0256   CHOLHDL 3.3 10/06/2017 0256   VLDL 22 10/06/2017 0256   LDLCALC 90 10/06/2017 0256    Radiology    Dg Chest 2 View  Result Date: 10/18/2017 CLINICAL DATA:  Dry cough EXAM: CHEST - 2 VIEW COMPARISON:  10/06/2017 FINDINGS: Small pleural effusions. Cardiomegaly with mild vascular congestion. Bibasilar airspace disease. Aortic atherosclerosis. No pneumothorax. IMPRESSION: 1. Small bilateral pleural effusions with bibasilar atelectasis or pneumonia 2. Cardiomegaly with mild vascular congestion. Overall improved aeration and decreased edema since comparison radiograph from 10/06/2017. Electronically Signed   By: Jasmine Pang M.D.   On: 10/18/2017 20:59    Cardiac Studies   Conclusion     Mid LAD lesion is 90% stenosed.  Prox LAD lesion is 99% stenosed.  Dist LAD lesion is 99% stenosed.  1st Mrg lesion is 95% stenosed.  Ost 2nd Mrg to 2nd Mrg lesion is 30% stenosed.  Ost RPDA to RPDA lesion is 30% stenosed.  Post intervention, there is a 0% residual stenosis.  Post intervention, there is a 0% residual stenosis.  Post intervention, there is a 35% residual stenosis.  A stent was successfully placed.  A stent was successfully placed.   Acute late presentation anterior ST segment elevation myocardial infarction with demonstration of subtotal long proximal LAD stenosis, 90% mid stenosis, and 99% apical stenosis with  reduced apical flow.  Left circumflex disease with 50% proximal OM1 stenosis followed by distal 95% marginal stenosis prior to giving off distal branches in the OM1 vessel.  Mild diffuse irregularity of a large dominant RCA with 30% narrowing in the PDA vessel.  LVEDP  34 mmHg/  Successful PCI to the LAD with ultimate insertion of a 2.526 mm Resolute DES stent postdilated to 2.8 mm proximally and 2.68 mm distally with the 99% stenosis being reduced to 0%; DES stenting of the mid 90% stenosis with ultimate insertion of a 2.2512 mm Resolute DES stent with residual narrowing at 0%, and initial 99% apical thrombotic stenosis with reduced flow treated with low-level PTCA and intracoronary verapamil with improvement to approximately 35-40% in a very small apical LAD segment.  RECOMMENDATION: DAPT for minimum of 1 year.  Diuresis with elevated EDP.  An echo Doppler study will be obtained in a.m.  Patient should be started on low-dose ACE inhibition, carvedilol, probable nitrate therapy, and high potency statin treatment.      Patient Profile     77 y.o. female with history of diabetes mellitus who presented with acute anterior myocardial infarction, starting 24-36 hours before presentation. LAD stenting performed.  Assessment & Plan    1. Anterior MI with late presentation: No recurrent angina; PCI/stent to prox and mid LAD with PTCA apical LAD; with concomitant CAD      2. Ischemic cardiomyopathy: EF 30% by echo; now on nitrates/hydralazine/ furosemide . Will replace lopressor with coreg 3.125 mg bid.  CXR improved aeration, less edema, small effusion. Increase imdur to 30 mg.  3. Acute kidney failure: improving with peak Cr 3.86 >>>> 2.15 today  4. HLD: now on atorvastatin 80 mg  5. DM: on insulin;  May be good candidate for future Jardiance if renal fxn improves to Cr Cl > 45.  Signed, Lennette Bihari, MD, [facility] 10/19/2017, 11:46 AM

## 2017-10-19 NOTE — Progress Notes (Signed)
Physical Therapy Treatment Patient Details Name: Sheila White MRN: [id number] DOB: January 16, 1941 Today's Date: 10/19/2017    History of Present Illness Patient is a 77 y.o. F with significant PMH of diabetes mellitus who presented to ED with chest pain and found to have an acute anterior myocardial infarction. Treated with angioplasty and stent. Devleoped post infarction pericarditis and hyponatremia.     PT Comments    Patient is progressing very well towards their physical therapy goals. Session focused on ambulating with no AD for balance training; patient with increased unsteadiness without RW but no overt LOB (total of 220 feet). Recommended continued use of RW for safety but will continue to challenge balance with therapy. Continues with dry cough before and after mobility; RN notified.     Follow Up Recommendations  Home health PT;Supervision/Assistance - 24 hour     Equipment Recommendations  Rolling walker with 5" wheels;3in1 (PT)    Recommendations for Other Services       Precautions / Restrictions Precautions Precautions: Fall Restrictions Weight Bearing Restrictions: No    Mobility  Bed Mobility               General bed mobility comments: OOB in recliner  Transfers Overall transfer level: Needs assistance Equipment used: Rolling walker (2 wheeled);None Transfers: Sit to/from Stand Sit to Stand: Supervision         General transfer comment: Requiring supervision for sit to stands from recliner and toilet.  Ambulation/Gait Ambulation/Gait assistance: Min guard Ambulation Distance (Feet): 220 Feet Assistive device: Rolling walker (2 wheeled);None Gait Pattern/deviations: Decreased stride length;Narrow base of support;Shuffle Gait velocity: decreased   General Gait Details: Patient ambulated initial 170 feet with no device and min guard assist; has increased unsteadiness with lateral sway but no overt LOB. VC's for relaxing arms at sides and wider BOS.  With RW, pt needs frequent cueing for proximity and increased L step length.   Stairs             Wheelchair Mobility    Modified Rankin (Stroke Patients Only)       Balance Overall balance assessment: Needs assistance Sitting-balance support: No upper extremity supported;Feet supported Sitting balance-Leahy Scale: Good     Standing balance support: No upper extremity supported;During functional activity Standing balance-Leahy Scale: Fair Standing balance comment: Needs additional assistance for dynamic balance                            Cognition Arousal/Alertness: Awake/alert Behavior During Therapy: WFL for tasks assessed/performed Overall Cognitive Status: Within Functional Limits for tasks assessed                                        Exercises      General Comments General comments (skin integrity, edema, etc.): HR 92-100 bpm      Pertinent Vitals/Pain Pain Assessment: No/denies pain    Home Living                      Prior Function            PT Goals (current goals can now be found in the care plan section) Acute Rehab PT Goals Patient Stated Goal: Be able to go to church PT Goal Formulation: With patient Time For Goal Achievement: 10/26/17 Potential to Achieve Goals: Good Progress towards PT goals: Progressing  toward goals    Frequency    Min 3X/week      PT Plan Current plan remains appropriate    Co-evaluation              AM-PAC PT "6 Clicks" Daily Activity  Outcome Measure  Difficulty turning over in bed (including adjusting bedclothes, sheets and blankets)?: A Little Difficulty moving from lying on back to sitting on the side of the bed? : A Little Difficulty sitting down on and standing up from a chair with arms (e.g., wheelchair, bedside commode, etc,.)?: A Little Help needed moving to and from a bed to chair (including a wheelchair)?: A Little Help needed walking in hospital  room?: A Little Help needed climbing 3-5 steps with a railing? : A Little 6 Click Score: 18    End of Session Equipment Utilized During Treatment: Gait belt Activity Tolerance: Patient tolerated treatment well Patient left: in chair;with call bell/phone within reach;with family/visitor present;with nursing/sitter in room Nurse Communication: Mobility status PT Visit Diagnosis: Unsteadiness on feet (R26.81);Muscle weakness (generalized) (M62.81);Difficulty in walking, not elsewhere classified (R26.2)     Time: [id number] PT Time Calculation (min) (ACUTE ONLY): 27 min  Charges:  $Gait Training: 23-37 mins                    G Codes:      Laurina Bustlearoline [place], PT, DPT Acute Rehabilitation Services  Pager: [phone]   Vanetta MuldersCarloine H Pate Aylward 10/19/2017, 11:46 AM

## 2017-10-20 ENCOUNTER — Encounter (HOSPITAL_COMMUNITY): Payer: Self-pay | Admitting: Cardiology

## 2017-10-20 DIAGNOSIS — I5023 Acute on chronic systolic (congestive) heart failure: Secondary | ICD-10-CM

## 2017-10-20 HISTORY — DX: Acute on chronic systolic (congestive) heart failure: I50.23

## 2017-10-20 LAB — GLUCOSE, CAPILLARY
GLUCOSE-CAPILLARY: 160 mg/dL — AB (ref 65–99)
GLUCOSE-CAPILLARY: 171 mg/dL — AB (ref 65–99)
Glucose-Capillary: 166 mg/dL — ABNORMAL HIGH (ref 65–99)
Glucose-Capillary: 229 mg/dL — ABNORMAL HIGH (ref 65–99)

## 2017-10-20 MED ORDER — CARVEDILOL 3.125 MG PO TABS
3.1250 mg | ORAL_TABLET | Freq: Two times a day (BID) | ORAL | Status: DC
  Administered 2017-10-20 – 2017-10-24 (×7): 3.125 mg via ORAL
  Filled 2017-10-20 (×9): qty 1

## 2017-10-20 MED ORDER — FUROSEMIDE 20 MG PO TABS
20.0000 mg | ORAL_TABLET | Freq: Every day | ORAL | Status: DC
  Administered 2017-10-21 – 2017-10-22 (×2): 20 mg via ORAL
  Filled 2017-10-20 (×2): qty 1

## 2017-10-20 NOTE — Plan of Care (Signed)

## 2017-10-20 NOTE — Progress Notes (Addendum)
Progress Note  Patient Name: Sheila White Date of Encounter: 10/20/2017  Primary Cardiologist: Nicki Guadalajara, MD   Subjective   Was doing well today until significant coughing spell and now with headache and tired.    Inpatient Medications    Scheduled Meds: . aspirin  81 mg Oral Daily  . atorvastatin  80 mg Oral q1800  . carvedilol  3.125 mg Oral BID WC  . colchicine  0.6 mg Oral Daily  . docusate sodium  100 mg Oral Daily  . fluticasone  2 spray Each Nare Daily  . furosemide  40 mg Oral Daily  . hydrALAZINE  12.5 mg Oral Q8H  . insulin aspart  0-5 Units Subcutaneous QHS  . insulin aspart  0-9 Units Subcutaneous TID WC  . isosorbide mononitrate  30 mg Oral Daily  . loratadine  10 mg Oral Daily  . pantoprazole  40 mg Oral BID  . polyethylene glycol  17 g Oral Daily  . sodium chloride flush  3 mL Intravenous Q12H  . sucralfate  1 g Oral Q6H  . ticagrelor  90 mg Oral BID   Continuous Infusions: . sodium chloride     PRN Meds: sodium chloride, acetaminophen, benzonatate, diazepam, guaiFENesin-dextromethorphan, iopamidol, morphine injection, nitroGLYCERIN, ondansetron (ZOFRAN) IV, pneumococcal 23 valent vaccine, sodium chloride flush, zolpidem   Vital Signs    Vitals:   10/19/17 2005 10/20/17 0008 10/20/17 0521 10/20/17 0805  BP: 113/66 98/62 94/65  98/68  Pulse: 80 73 71 72  Resp: 16 16 17 16   Temp: 98.3 F (36.8 C) 97.6 F (36.4 C) 98.4 F (36.9 C) 98.2 F (36.8 C)  TempSrc: Oral Axillary Oral Oral  SpO2: 97% 95% 96% 96%  Weight:   113 lb (51.3 kg)   Height:        Intake/Output Summary (Last 24 hours) at 10/20/2017 1207 Last data filed at 10/20/2017 0859 Gross per 24 hour  Intake 610 ml  Output 700 ml  Net -90 ml   Filed Weights   10/18/17 0506 10/19/17 0544 10/20/17 0521  Weight: 124 lb 5.4 oz (56.4 kg) 123 lb 12.8 oz (56.2 kg) 113 lb (51.3 kg)    Telemetry    SR rates in 80s to 90s  - Personally Reviewed  ECG    No new, will check one 10/21/17  - Personally Reviewed  Physical Exam   GEN: No acute distress.  + fatigue  Neck: No JVD Cardiac: RRR, no murmurs, rubs, or gallops.  Respiratory: breath sounds to auscultation bilaterally. Decreased on Rt somewhat with rhonchi, no rales or wheezes  GI: Soft, nontender, non-distended  MS: No edema; No deformity. Neuro:  Nonfocal  Psych: Normal affect   Labs    Chemistry Recent Labs  Lab 10/14/17 0228 10/17/17 1200 10/18/17 0349 10/19/17 0325  NA 132* 136 133* 130*  K 3.7 3.3* 3.3* 4.3  CL 98* 99* 101 97*  CO2 22 25 21* 21*  GLUCOSE 125* 166* 157* 147*  BUN 24* 23* 26* 24*  CREATININE 2.30* 2.23* 2.19* 2.15*  CALCIUM 8.4* 8.4* 7.6* 7.8*  PROT 6.0*  --   --   --   ALBUMIN 2.7*  --   --   --   AST 25  --   --   --   ALT 17  --   --   --   ALKPHOS 88  --   --   --   BILITOT 0.6  --   --   --  GFRNONAA 19* 20* 20* 21*  GFRAA 22* 23* 23* 24*  ANIONGAP 12 12 11 12      Hematology Recent Labs  Lab 10/14/17 0228 10/14/17 1339 10/17/17 1200  WBC  --  6.5 7.1  RBC  --  3.78* 3.91  HGB  --  10.4* 10.7*  HCT 29.4* 31.3* 32.6*  MCV  --  82.8 83.4  MCH  --  27.5 27.4  MCHC  --  33.2 32.8  RDW  --  14.4 14.6  PLT  --  427* 399    Cardiac Enzymes Recent Labs  Lab 10/14/17 0858  TROPONINI 0.42*   No results for input(s): TROPIPOC in the last 168 hours.   BNP Recent Labs  Lab 10/14/17 0858 10/17/17 1200  BNP 2,856.8* 1,448.2*    Lipid Panel     Component Value Date/Time   CHOL 161 10/06/2017 0256   TRIG 110 10/06/2017 0256   HDL 49 10/06/2017 0256   CHOLHDL 3.3 10/06/2017 0256   VLDL 22 10/06/2017 0256   LDLCALC 90 10/06/2017 0256   DDimer No results for input(s): DDIMER in the last 168 hours.   Radiology    Dg Chest 2 View  Result Date: 10/18/2017 CLINICAL DATA:  Dry cough EXAM: CHEST - 2 VIEW COMPARISON:  10/06/2017 FINDINGS: Small pleural effusions. Cardiomegaly with mild vascular congestion. Bibasilar airspace disease. Aortic atherosclerosis. No  pneumothorax. IMPRESSION: 1. Small bilateral pleural effusions with bibasilar atelectasis or pneumonia 2. Cardiomegaly with mild vascular congestion. Overall improved aeration and decreased edema since comparison radiograph from 10/06/2017. Electronically Signed   By: Jasmine Pang M.D.   On: 10/18/2017 20:59    Cardiac Studies   Conclusion     Mid LAD lesion is 90% stenosed.  Prox LAD lesion is 99% stenosed.  Dist LAD lesion is 99% stenosed.  1st Mrg lesion is 95% stenosed.  Ost 2nd Mrg to 2nd Mrg lesion is 30% stenosed.  Ost RPDA to RPDA lesion is 30% stenosed.  Post intervention, there is a 0% residual stenosis.  Post intervention, there is a 0% residual stenosis.  Post intervention, there is a 35% residual stenosis.  A stent was successfully placed.  A stent was successfully placed.  Acute late presentation anterior ST segment elevation myocardial infarction with demonstration of subtotal long proximal LAD stenosis, 90% mid stenosis, and 99% apical stenosis with reduced apical flow.  Left circumflex disease with 50% proximal OM1 stenosis followed by distal 95% marginal stenosis prior to giving off distal branches in the OM1 vessel.  Mild diffuse irregularity of a large dominant RCA with 30% narrowing in the PDA vessel.  LVEDP 34 mmHg/  Successful PCI to the LAD with ultimate insertion of a 2.526 mm Resolute DES stent postdilated to 2.8 mm proximally and 2.68 mm distally with the 99% stenosis being reduced to 0%; DES stenting of the mid 90% stenosis with ultimate insertion of a 2.2512 mm Resolute DES stent with residual narrowing at 0%, and initial 99% apical thrombotic stenosis with reduced flow treated with low-level PTCA and intracoronary verapamil with improvement to approximately 35-40% in a very small apical LAD segment.  RECOMMENDATION: DAPT for minimum of 1 year. Diuresis with elevated EDP. An echo Doppler study will be obtained in a.m. Patient should  be started on low-dose ACE inhibition, carvedilol, probable nitrate therapy, and high potency statin treatment.     Echo 10/06/17  Study Conclusions  - Left ventricle: The cavity size was normal. There was mild focal   basal  hypertrophy of the septum. Mid to apical   inferoseptal/anteroseptal akinesis, apical lateral akinesis, mid   to apical anterior akinesis, apical inferior akinesis, akinesis   of the true apex. The estimated ejection fraction was 30%.   Features are consistent with a pseudonormal left ventricular   filling pattern, with concomitant abnormal relaxation and   increased filling pressure (grade 2 diastolic dysfunction). - Aortic valve: Trileaflet; moderately calcified leaflets.   Sclerosis without stenosis. There was trivial regurgitation. - Mitral valve: Mildly calcified annulus. There was trivial   regurgitation. - Right ventricle: The cavity size was normal. Systolic function   was normal. - Pulmonary arteries: No complete TR doppler jet so unable to   estimate PA systolic pressure. - Inferior vena cava: The vessel was normal in size. The   respirophasic diameter changes were in the normal range (= 50%),   consistent with normal central venous pressure. - Pericardium, extracardiac: A trivial pericardial effusion was   identified.  Impressions:  - Normal LV size with mild focal basal septal hypertrophy. EF 30%   with wall motion abnormalities as noted above in LAD   distribution. Normal RV size and systolic function. Aortic valve   sclerosis without significant stenosis.   Patient Profile     77 y.o. female with history of diabetes mellitus who presented with acute anterior myocardial infarction, starting 24-36 hours before presentation. LAD stenting performed.  Severe hyponatremia of uncertain duration, not responding to diuresis for heart failure management.Worsening renal function for which nephrology is following.   Assessment & Plan    Acute  ant wall MI, late presenting, with DES to LAD -  No chest pain, PCI/stent to prox and mid LAD with PTCA apical LAD; with concomitant CAD continue DAPT, on BB and   Cough severe at times, has improved with tessalon capsules, but did have bought this AM.  CXR with bil. Pl effusions.  Afebrile.  On lasix 40 mg daily.   ICM  EF 30%, continue nitrates, hydralazine furosemide and BB now with coreg.    AKI with Cr pk of 3.86 Yesterday 2.15 renal signed off 10/14/17.  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  She required 3% NS 4/14 which resulted in a good response- water diuresis"  acute systolic HF improving.  Upper abd pain --GI evaluated and then signed off 10/16/17   Initial US showeda distended gallbladder but no overt cholecystitis or gallstones. LFTs / lipasenormal.CT scan without contrast due to renal function, showed distended gallbladder with some sludge but no other pathology. We have been empirically treated for possible PUD with BID PPI and carafate. HIDA scan was only a partial study as the patient aborted the test, but it does not show any evidence of cholecystitis. She then developed a few episodes of diarrhea, C diff negative.  EGD was held off due to recent MI.  If pain reoccurs then possible trial of bentyl for Bowel spasms. --continue PPI, carafate for a few days.   Anemia  Will monitor;  Iron was 22  Weakness,  Cardiac rehab and PT is following.  ? Discharge in next 1-2 days?    HOH chronic  Hypokalemia improved will monitor  Diabetes type 2 on sliding scale.  Hgb A1C was 6.6,  Was on metformin but with AKI would not add back  Glucose is 130 to 218 - was also on amaryl ? Resume   HLD with LDL 90 on admit now on lipitor  80 , will need follow up as outpt.    For questions or updates, please contact [facility] Please consult [email or web address] for contact info under Cardiology/STEMI.       Signed, Nada Boozer, NP  10/20/2017, 12:07 PM     Patient seen and examined. Agree with assessment and plan.  No recurrent chest pain.  Resting heart rate in the upper 90s.  No significant volume overload on exam.  Will try to titrate carvedilol up to 6.25 mg twice a day starting tomorrow if blood pressure allows.  Creatinine today is 2.15 continues to slowly improve.  Recheck BNP in a.m. Possible discharge in the next 48 to 72 hours if clinically improved   Lennette Bihari, MD, [facility] 10/20/2017 4:34 PM

## 2017-10-20 NOTE — Progress Notes (Signed)
CARDIAC REHAB PHASE I   PRE:  Rate/Rhythm: 85 SR  BP:  Supine:   Sitting: 100/69  Standing:    SaO2: 99%RA  MODE:  Ambulation: 300 ft   POST:  Rate/Rhythm: 108 ST  BP:  Supine:   Sitting: 103/67  Standing:    SaO2: 98-100%RA 1333-1355 Pt hesitant to walk. C/o feeling lightheaded and stated right leg weak. Pt did walk 300 ft on RA with gait belt use, rolling walker and asst x 1. Encouraged to stay close to walker. Granddaughter interpreted for me. Pt felt better after walk. Still a little lightheaded but not bad. To recliner with call bell.   Luetta Nuttingharlene Ilka Lovick, RN BSN  10/20/2017 1:52 PM

## 2017-10-21 ENCOUNTER — Telehealth: Payer: Self-pay | Admitting: Cardiovascular Disease

## 2017-10-21 DIAGNOSIS — E871 Hypo-osmolality and hyponatremia: Secondary | ICD-10-CM

## 2017-10-21 DIAGNOSIS — I5021 Acute systolic (congestive) heart failure: Secondary | ICD-10-CM

## 2017-10-21 LAB — GLUCOSE, CAPILLARY
GLUCOSE-CAPILLARY: 177 mg/dL — AB (ref 65–99)
GLUCOSE-CAPILLARY: 166 mg/dL — AB (ref 65–99)
Glucose-Capillary: 150 mg/dL — ABNORMAL HIGH (ref 65–99)
Glucose-Capillary: 162 mg/dL — ABNORMAL HIGH (ref 65–99)

## 2017-10-21 LAB — BRAIN NATRIURETIC PEPTIDE: B Natriuretic Peptide: 868.5 pg/mL — ABNORMAL HIGH (ref 0.0–100.0)

## 2017-10-21 LAB — BASIC METABOLIC PANEL
Anion gap: 12 (ref 5–15)
BUN: 26 mg/dL — AB (ref 6–20)
CO2: 20 mmol/L — AB (ref 22–32)
Calcium: 7.6 mg/dL — ABNORMAL LOW (ref 8.9–10.3)
Chloride: 97 mmol/L — ABNORMAL LOW (ref 101–111)
Creatinine, Ser: 2.15 mg/dL — ABNORMAL HIGH (ref 0.44–1.00)
GFR calc Af Amer: 24 mL/min — ABNORMAL LOW (ref >60)
GFR calc non Af Amer: 21 mL/min — ABNORMAL LOW (ref >60)
GLUCOSE: 141 mg/dL — AB (ref 65–99)
POTASSIUM: 3.1 mmol/L — AB (ref 3.5–5.1)
Sodium: 129 mmol/L — ABNORMAL LOW (ref 135–145)

## 2017-10-21 LAB — CBC
HEMATOCRIT: 27.2 % — AB (ref 36.0–46.0)
HEMOGLOBIN: 9 g/dL — AB (ref 12.0–15.0)
MCH: 26.9 pg (ref 26.0–34.0)
MCHC: 33.1 g/dL (ref 30.0–36.0)
MCV: 81.4 fL (ref 78.0–100.0)
Platelets: 292 10*3/uL (ref 150–400)
RBC: 3.34 MIL/uL — ABNORMAL LOW (ref 3.87–5.11)
RDW: 14.1 % (ref 11.5–15.5)
WBC: 7.1 10*3/uL (ref 4.0–10.5)

## 2017-10-21 MED ORDER — POTASSIUM CHLORIDE CRYS ER 20 MEQ PO TBCR
40.0000 meq | EXTENDED_RELEASE_TABLET | Freq: Once | ORAL | Status: AC
  Administered 2017-10-21: 40 meq via ORAL
  Filled 2017-10-21: qty 2

## 2017-10-21 NOTE — Telephone Encounter (Signed)
PATIENT CURRENTLY ADMITTED 10/21/17

## 2017-10-21 NOTE — Progress Notes (Signed)
Physical Therapy Treatment Patient Details Name: Sheila White MRN: [id number] DOB: 1940-08-01 Today's Date: 10/21/2017    History of Present Illness Patient is a 77 y.o. F with significant PMH of diabetes mellitus who presented to ED with chest pain and found to have an acute anterior myocardial infarction. Treated with angioplasty and stent. Devleoped post infarction pericarditis and hyponatremia.     PT Comments    Patient with improvement in endurance as evidenced by increased ambulation distance to 350 feet using RW and faster gait speed. Continues to need frequent reminders for RW proximity in order to promote upright posture and improved balance. Patient needs cueing for activity pacing. Following ambulation, patient stating, she was "very tired." Continues with dry cough prior to and following mobility; no cough noted during walk.    Follow Up Recommendations  Home health PT;Supervision/Assistance - 24 hour     Equipment Recommendations  Rolling walker with 5" wheels;3in1 (PT)    Recommendations for Other Services       Precautions / Restrictions Precautions Precautions: Fall Restrictions Weight Bearing Restrictions: No    Mobility  Bed Mobility               General bed mobility comments: OOB in recliner  Transfers Overall transfer level: Needs assistance Equipment used: Rolling walker (2 wheeled);None Transfers: Sit to/from Stand Sit to Stand: Supervision            Ambulation/Gait Ambulation/Gait assistance: Supervision   Assistive device: Rolling walker (2 wheeled);None Gait Pattern/deviations: Decreased stride length;Narrow base of support;Shuffle Gait velocity: decreased   General Gait Details: Supervision for safety. Required frequent reminders for RW proximity. Improved gait speed.   Stairs             Wheelchair Mobility    Modified Rankin (Stroke Patients Only)       Balance Overall balance assessment: Needs  assistance Sitting-balance support: No upper extremity supported;Feet supported Sitting balance-Leahy Scale: Good     Standing balance support: No upper extremity supported;During functional activity Standing balance-Leahy Scale: Fair Standing balance comment: Needs additional assistance for dynamic balance                            Cognition Arousal/Alertness: Awake/alert Behavior During Therapy: WFL for tasks assessed/performed Overall Cognitive Status: Within Functional Limits for tasks assessed                                        Exercises      General Comments General comments (skin integrity, edema, etc.): Phone interpreter used. Resting BP: 100/55; BP after ambulation: 116/65. HR 92-96 bpm      Pertinent Vitals/Pain Pain Assessment: No/denies pain    Home Living                      Prior Function            PT Goals (current goals can now be found in the care plan section) Acute Rehab PT Goals Patient Stated Goal: Be able to go to church PT Goal Formulation: With patient Time For Goal Achievement: 10/26/17 Potential to Achieve Goals: Good Progress towards PT goals: Progressing toward goals    Frequency    Min 3X/week      PT Plan Current plan remains appropriate    Co-evaluation  AM-PAC PT "6 Clicks" Daily Activity  Outcome Measure  Difficulty turning over in bed (including adjusting bedclothes, sheets and blankets)?: A Little Difficulty moving from lying on back to sitting on the side of the bed? : A Little Difficulty sitting down on and standing up from a chair with arms (e.g., wheelchair, bedside commode, etc,.)?: A Little Help needed moving to and from a bed to chair (including a wheelchair)?: A Little Help needed walking in hospital room?: A Little Help needed climbing 3-5 steps with a railing? : A Little 6 Click Score: 18    End of Session Equipment Utilized During Treatment: Gait  belt Activity Tolerance: Patient tolerated treatment well Patient left: in chair;with call bell/phone within reach;with family/visitor present;with nursing/sitter in room Nurse Communication: Mobility status PT Visit Diagnosis: Unsteadiness on feet (R26.81);Muscle weakness (generalized) (M62.81);Difficulty in walking, not elsewhere classified (R26.2)     Time: 1012-1036 PT Time Calculation (min) ([id number] ONLY): 24 min  Charges:  $Gait Training: 23-37 mins                    G Codes:       Laurina Bustlearoline Atley Neubert, PT, DPT Acute Rehabilitation Services  Pager: [phone]    Vanetta MuldersCarloine H Jahmai Finelli 10/21/2017, 11:52 AM

## 2017-10-21 NOTE — Telephone Encounter (Signed)
Sheila White, Sheila White MRN: [id number]  Date: 10/28/2017 Status: Sch  Time: 11:30 AM Length: 30  Visit Type: OFFICE VISIT 30 [368] Copay: $0.00  Provider: Darrol JumpBarrett, Rhonda G, PA-C Department: [facility]  Referring Provider:  CSN: [id number]  Notes: TOC Dr.Kelly or APP/ okay to go outside of team per Bryson DamesLaura Ingold/ks  Made On: 10/21/2017 3:07 PM

## 2017-10-21 NOTE — Progress Notes (Signed)
CARDIAC REHAB PHASE I   PRE:  Rate/Rhythm: 89 SR    BP: sitting 96/56    SaO2:   MODE:  Ambulation: 340 ft   POST:  Rate/Rhythm: 93 SR    BP: sitting 87/73, recheck 89/56     SaO2:   Pt moving better. Walked in room without RW. Able to walk in hall with RW, tired toward end. Her BP is lower after walk. Sts she feels weak and some lightheadedness but seems to be better than a few days ago. Daughter present and we discussed low sodium diet, daily wts, walking and CRPII. Will send referral to [place] [facility]. Gave information in [place] and [place]. Good reception.  [id number]   Harriet MassonRandi Kristan Justise Ehmann CES, ACSM 10/21/2017 3:24 PM

## 2017-10-21 NOTE — Progress Notes (Addendum)
Progress Note  Patient Name: Sheila White Date of Encounter: 10/21/2017  Primary Cardiologist: Nicki Guadalajarahomas Kelly, MD   Subjective   No chest pain no SOB, energy is better, has walked in hall twice today     Inpatient Medications    Scheduled Meds: . aspirin  81 mg Oral Daily  . atorvastatin  80 mg Oral q1800  . carvedilol  3.125 mg Oral BID WC  . colchicine  0.6 mg Oral Daily  . docusate sodium  100 mg Oral Daily  . fluticasone  2 spray Each Nare Daily  . furosemide  20 mg Oral Daily  . hydrALAZINE  12.5 mg Oral Q8H  . insulin aspart  0-5 Units Subcutaneous QHS  . insulin aspart  0-9 Units Subcutaneous TID WC  . isosorbide mononitrate  30 mg Oral Daily  . loratadine  10 mg Oral Daily  . pantoprazole  40 mg Oral BID  . polyethylene glycol  17 g Oral Daily  . sodium chloride flush  3 mL Intravenous Q12H  . sucralfate  1 g Oral Q6H  . ticagrelor  90 mg Oral BID   Continuous Infusions: . sodium chloride     PRN Meds: sodium chloride, acetaminophen, benzonatate, diazepam, guaiFENesin-dextromethorphan, iopamidol, morphine injection, nitroGLYCERIN, ondansetron (ZOFRAN) IV, pneumococcal 23 valent vaccine, sodium chloride flush, zolpidem   Vital Signs    Vitals:   10/21/17 0245 10/21/17 0735 10/21/17 0950 10/21/17 1212  BP: (!) 111/50 (!) 98/55 (!) 105/55 (!) 91/52  Pulse: 68 93 92 80  Resp: 20 18  19   Temp: 98.1 F (36.7 C)   98.1 F (36.7 C)  TempSrc: Oral   Oral  SpO2: 100%   98%  Weight: 111 lb 3.2 oz (50.4 kg)     Height:        Intake/Output Summary (Last 24 hours) at 10/21/2017 1338 Last data filed at 10/21/2017 0249 Gross per 24 hour  Intake 220 ml  Output 625 ml  Net -405 ml   Filed Weights   10/19/17 0544 10/20/17 0521 10/21/17 0245  Weight: 123 lb 12.8 oz (56.2 kg) 113 lb (51.3 kg) 111 lb 3.2 oz (50.4 kg)    Telemetry    SR - Personally Reviewed  ECG    SR at 83, continues with ST elevation ant leads but improved - Personally Reviewed  Physical  Exam   GEN: No acute distress.   Neck: No JVD Cardiac: RRR, no murmurs, rubs, or gallops.  Respiratory: Clear to diminished to auscultation bilaterally. occ rhonchi , no rales.   GI: Soft, nontender, non-distended  MS: No edema; No deformity. Neuro:  Nonfocal  Psych: Normal affect   Labs    Chemistry Recent Labs  Lab 10/18/17 0349 10/19/17 0325 10/21/17 0319  NA 133* 130* 129*  K 3.3* 4.3 3.1*  CL 101 97* 97*  CO2 21* 21* 20*  GLUCOSE 157* 147* 141*  BUN 26* 24* 26*  CREATININE 2.19* 2.15* 2.15*  CALCIUM 7.6* 7.8* 7.6*  GFRNONAA 20* 21* 21*  GFRAA 23* 24* 24*  ANIONGAP 11 12 12      Hematology Recent Labs  Lab 10/14/17 1339 10/17/17 1200 10/21/17 0319  WBC 6.5 7.1 7.1  RBC 3.78* 3.91 3.34*  HGB 10.4* 10.7* 9.0*  HCT 31.3* 32.6* 27.2*  MCV 82.8 83.4 81.4  MCH 27.5 27.4 26.9  MCHC 33.2 32.8 33.1  RDW 14.4 14.6 14.1  PLT 427* 399 292    Cardiac EnzymesNo results for input(s): TROPONINI in the last 168  hours. No results for input(s): TROPIPOC in the last 168 hours.   BNP Recent Labs  Lab 10/17/17 1200 10/21/17 0319  BNP 1,448.2* 868.5*     DDimer No results for input(s): DDIMER in the last 168 hours.   Radiology    No results found.  Cardiac Studies   Conclusion     Mid LAD lesion is 90% stenosed.  Prox LAD lesion is 99% stenosed.  Dist LAD lesion is 99% stenosed.  1st Mrg lesion is 95% stenosed.  Ost 2nd Mrg to 2nd Mrg lesion is 30% stenosed.  Ost RPDA to RPDA lesion is 30% stenosed.  Post intervention, there is a 0% residual stenosis.  Post intervention, there is a 0% residual stenosis.  Post intervention, there is a 35% residual stenosis.  A stent was successfully placed.  A stent was successfully placed.  Acute late presentation anterior ST segment elevation myocardial infarction with demonstration of subtotal long proximal LAD stenosis, 90% mid stenosis, and 99% apical stenosis with reduced apical flow.  Left  circumflex disease with 50% proximal OM1 stenosis followed by distal 95% marginal stenosis prior to giving off distal branches in the OM1 vessel.  Mild diffuse irregularity of a large dominant RCA with 30% narrowing in the PDA vessel.  LVEDP 34 mmHg/  Successful PCI to the LAD with ultimate insertion of a 2.526 mm Resolute DES stent postdilated to 2.8 mm proximally and 2.68 mm distally with the 99% stenosis being reduced to 0%; DES stenting of the mid 90% stenosis with ultimate insertion of a 2.2512 mm Resolute DES stent with residual narrowing at 0%, and initial 99% apical thrombotic stenosis with reduced flow treated with low-level PTCA and intracoronary verapamil with improvement to approximately 35-40% in a very small apical LAD segment.  RECOMMENDATION: DAPT for minimum of 1 year. Diuresis with elevated EDP. An echo Doppler study will be obtained in a.m. Patient should be started on low-dose ACE inhibition, carvedilol, probable nitrate therapy, and high potency statin treatment.     Echo 10/06/17  Study Conclusions  - Left ventricle: The cavity size was normal. There was mild focal basal hypertrophy of the septum. Mid to apical inferoseptal/anteroseptal akinesis, apical lateral akinesis, mid to apical anterior akinesis, apical inferior akinesis, akinesis of the true apex. The estimated ejection fraction was 30%. Features are consistent with a pseudonormal left ventricular filling pattern, with concomitant abnormal relaxation and increased filling pressure (grade 2 diastolic dysfunction). - Aortic valve: Trileaflet; moderately calcified leaflets. Sclerosis without stenosis. There was trivial regurgitation. - Mitral valve: Mildly calcified annulus. There was trivial regurgitation. - Right ventricle: The cavity size was normal. Systolic function was normal. - Pulmonary arteries: No complete TR doppler jet so unable to estimate PA systolic  pressure. - Inferior vena cava: The vessel was normal in size. The respirophasic diameter changes were in the normal range (= 50%), consistent with normal central venous pressure. - Pericardium, extracardiac: A trivial pericardial effusion was identified.  Impressions:  - Normal LV size with mild focal basal septal hypertrophy. EF 30% with wall motion abnormalities as noted above in LAD distribution. Normal RV size and systolic function. Aortic valve sclerosis without significant stenosis.      Patient Profile     77 y.o. female with history of diabetes mellitus who presented with acute anterior myocardial infarction, starting 24-36 hours before presentation. LAD stenting performed.  Severe hyponatremia of uncertain duration, not responding to diuresis for heart failure management.Worsening renal function for which nephrology is following.  Assessment & Plan    Principal Problem:   STEMI involving left anterior descending coronary artery (HCC) late presenting, with DES to LAD -  No chest pain, PCI/stent to prox and mid LAD with PTCA apical LAD; with concomitant CAD continue DAPT, on BB  --no chest pain   Active Problems:      Acute systolic HF (heart failure) (HCC) BNP today 868 decreased from 1448.  --new neg 1015  Wt at 111 down from 126 lbs at peak. 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 She required 3% NS 4/14 which resulted in a good response- water diuresis"    Hypokalemia today at 3.1 will replace      AKI   Cr now 2.15  pk Cr of 3.86  Renal signed off 10/14/17     HOH (hard of hearing)--chronic    MI, acute, non ST segment elevation (HCC)--see above  Ischemic cardiomyopathy-- WF 30% on nitrates, hydralazine, furosemide and BB     Abdominal pain- seen and evaluated by GI they have signed off- stop Carafate prior to  discharge and continue PPI         Anemia - Hgb 9.0 down from 10.4 Iron def.  And chronic disease.      Cough still with cough last night       Weakness slowly improving with PT and rehab.       DM-2 on SSI  Will ask diabetes coordinator to assist.  No further metformin.        HLD   On lipitor 80 will need follow up as outpt.      Hypokalemic at 3.1 will replace and recheck in AM    For questions or updates, please contact [facility] Please consult [email or web address] for contact info under Cardiology/STEMI.      Signed, Nada Boozer, NP  10/21/2017, 1:38 PM     Patient seen and examined. Agree with assessment and plan. I/O -5502 since admission, + 200 so far today.  Feeling better but still with some residual cough.  BNP improved today at 868 from 1448 but still elevated. Coughs when takes a deep breath.  BP has been low which prevents further medication titration of nitrates/hydralazine.  Renal function appears to have stabilized with a creatinine today at 2.15.  Continue Lasix with sodium at 129 to excrete more free water.  K replete with potassium of 3.1.  Iron saturation 22%.  No recurrent angina.   Lennette Bihari, MD, [facility] 10/21/2017 4:50 PM

## 2017-10-22 LAB — BASIC METABOLIC PANEL
Anion gap: 12 (ref 5–15)
BUN: 25 mg/dL — ABNORMAL HIGH (ref 6–20)
CHLORIDE: 95 mmol/L — AB (ref 101–111)
CO2: 22 mmol/L (ref 22–32)
Calcium: 8.1 mg/dL — ABNORMAL LOW (ref 8.9–10.3)
Creatinine, Ser: 2.24 mg/dL — ABNORMAL HIGH (ref 0.44–1.00)
GFR calc non Af Amer: 20 mL/min — ABNORMAL LOW (ref >60)
GFR, EST AFRICAN AMERICAN: 23 mL/min — AB (ref >60)
Glucose, Bld: 142 mg/dL — ABNORMAL HIGH (ref 65–99)
POTASSIUM: 3.4 mmol/L — AB (ref 3.5–5.1)
SODIUM: 129 mmol/L — AB (ref 135–145)

## 2017-10-22 LAB — GLUCOSE, CAPILLARY
GLUCOSE-CAPILLARY: 167 mg/dL — AB (ref 65–99)
GLUCOSE-CAPILLARY: 164 mg/dL — AB (ref 65–99)
GLUCOSE-CAPILLARY: 145 mg/dL — AB (ref 65–99)
Glucose-Capillary: 155 mg/dL — ABNORMAL HIGH (ref 65–99)

## 2017-10-22 LAB — CBC
HCT: 27.4 % — ABNORMAL LOW (ref 36.0–46.0)
Hemoglobin: 9.2 g/dL — ABNORMAL LOW (ref 12.0–15.0)
MCH: 27.2 pg (ref 26.0–34.0)
MCHC: 33.6 g/dL (ref 30.0–36.0)
MCV: 81.1 fL (ref 78.0–100.0)
PLATELETS: 290 10*3/uL (ref 150–400)
RBC: 3.38 MIL/uL — ABNORMAL LOW (ref 3.87–5.11)
RDW: 13.5 % (ref 11.5–15.5)
WBC: 5.9 10*3/uL (ref 4.0–10.5)

## 2017-10-22 MED ORDER — POTASSIUM CHLORIDE CRYS ER 20 MEQ PO TBCR
40.0000 meq | EXTENDED_RELEASE_TABLET | Freq: Once | ORAL | Status: AC
  Administered 2017-10-22: 40 meq via ORAL
  Filled 2017-10-22: qty 2

## 2017-10-22 MED ORDER — ISOSORBIDE MONONITRATE ER 30 MG PO TB24
15.0000 mg | ORAL_TABLET | Freq: Every day | ORAL | Status: DC
  Administered 2017-10-23 – 2017-10-24 (×2): 15 mg via ORAL
  Filled 2017-10-22 (×2): qty 1

## 2017-10-22 NOTE — Progress Notes (Addendum)
Ronie Spies, PA returned my call. New orders received and will be placed by her.  K. Doran Clay, RN

## 2017-10-22 NOTE — Progress Notes (Addendum)
Progress Note  Patient Name: Sheila White Date of Encounter: 10/22/2017  Primary Cardiologist: Nicki Guadalajara, MD   Subjective   No chest pain, but epigastric pain, ongoing cough.    Inpatient Medications    Scheduled Meds: . aspirin  81 mg Oral Daily  . atorvastatin  80 mg Oral q1800  . carvedilol  3.125 mg Oral BID WC  . colchicine  0.6 mg Oral Daily  . docusate sodium  100 mg Oral Daily  . fluticasone  2 spray Each Nare Daily  . furosemide  20 mg Oral Daily  . hydrALAZINE  12.5 mg Oral Q8H  . insulin aspart  0-5 Units Subcutaneous QHS  . insulin aspart  0-9 Units Subcutaneous TID WC  . isosorbide mononitrate  30 mg Oral Daily  . loratadine  10 mg Oral Daily  . pantoprazole  40 mg Oral BID  . polyethylene glycol  17 g Oral Daily  . sodium chloride flush  3 mL Intravenous Q12H  . sucralfate  1 g Oral Q6H  . ticagrelor  90 mg Oral BID   Continuous Infusions: . sodium chloride     PRN Meds: sodium chloride, acetaminophen, benzonatate, diazepam, guaiFENesin-dextromethorphan, iopamidol, morphine injection, nitroGLYCERIN, ondansetron (ZOFRAN) IV, pneumococcal 23 valent vaccine, sodium chloride flush, zolpidem   Vital Signs    Vitals:   10/22/17 0250 10/22/17 0609 10/22/17 0810 10/22/17 1016  BP: 106/71 (!) 98/52 (!) 95/59 120/71  Pulse: 93  84 94  Resp:  15  (!) 22  Temp: 98.1 F (36.7 C)  98.9 F (37.2 C)   TempSrc: Oral  Oral   SpO2: 99%   100%  Weight: 110 lb 8 oz (50.1 kg)     Height:        Intake/Output Summary (Last 24 hours) at 10/22/2017 1055 Last data filed at 10/22/2017 1025 Gross per 24 hour  Intake 203 ml  Output 300 ml  Net -97 ml   Filed Weights   10/20/17 0521 10/21/17 0245 10/22/17 0250  Weight: 113 lb (51.3 kg) 111 lb 3.2 oz (50.4 kg) 110 lb 8 oz (50.1 kg)    Telemetry    SR - Personally Reviewed  ECG    SR at 83, continues with ST elevation ant leads but improved - Personally Reviewed  Physical Exam   GEN: No acute distress.     Neck: No JVD Cardiac: RRR, no murmurs, rubs, or gallops.  Respiratory: Clear to diminished to auscultation bilaterally. occ rhonchi , no rales.   GI: Soft, nontender, non-distended  MS: No edema; No deformity. Neuro:  Nonfocal  Psych: Normal affect   Labs    Chemistry Recent Labs  Lab 10/19/17 0325 10/21/17 0319 10/22/17 0252  NA 130* 129* 129*  K 4.3 3.1* 3.4*  CL 97* 97* 95*  CO2 21* 20* 22  GLUCOSE 147* 141* 142*  BUN 24* 26* 25*  CREATININE 2.15* 2.15* 2.24*  CALCIUM 7.8* 7.6* 8.1*  GFRNONAA 21* 21* 20*  GFRAA 24* 24* 23*  ANIONGAP Hematology Recent Labs  Lab 10/17/17 1200 10/21/17 0319  WBC 7.1 7.1  RBC 3.91 3.34*  HGB 10.7* 9.0*  HCT 32.6* 27.2*  MCV 83.4 81.4  MCH 27.4 26.9  MCHC 32.8 33.1  RDW 14.6 14.1  PLT 399 292    Cardiac EnzymesNo results for input(s): TROPONINI in the last 168 hours. No results for input(s): TROPIPOC in the last 168 hours.   BNP Recent Labs  Lab  10/17/17 1200 10/21/17 0319  BNP 1,448.2* 868.5*     DDimer No results for input(s): DDIMER in the last 168 hours.   Radiology    No results found.  Cardiac Studies   Conclusion cath 10/05/17    Mid LAD lesion is 90% stenosed.  Prox LAD lesion is 99% stenosed.  Dist LAD lesion is 99% stenosed.  1st Mrg lesion is 95% stenosed.  Ost 2nd Mrg to 2nd Mrg lesion is 30% stenosed.  Ost RPDA to RPDA lesion is 30% stenosed.  Post intervention, there is a 0% residual stenosis.  Post intervention, there is a 0% residual stenosis.  Post intervention, there is a 35% residual stenosis.  A stent was successfully placed.  A stent was successfully placed.  Acute late presentation anterior ST segment elevation myocardial infarction with demonstration of subtotal long proximal LAD stenosis, 90% mid stenosis, and 99% apical stenosis with reduced apical flow.  Left circumflex disease with 50% proximal OM1 stenosis followed by distal 95% marginal stenosis  prior to giving off distal branches in the OM1 vessel.  Mild diffuse irregularity of a large dominant RCA with 30% narrowing in the PDA vessel.  LVEDP 34 mmHg/  Successful PCI to the LAD with ultimate insertion of a 2.526 mm Resolute DES stent postdilated to 2.8 mm proximally and 2.68 mm distally with the 99% stenosis being reduced to 0%; DES stenting of the mid 90% stenosis with ultimate insertion of a 2.2512 mm Resolute DES stent with residual narrowing at 0%, and initial 99% apical thrombotic stenosis with reduced flow treated with low-level PTCA and intracoronary verapamil with improvement to approximately 35-40% in a very small apical LAD segment.  RECOMMENDATION: DAPT for minimum of 1 year. Diuresis with elevated EDP. An echo Doppler study will be obtained in a.m. Patient should be started on low-dose ACE inhibition, carvedilol, probable nitrate therapy, and high potency statin treatment.     Echo 10/06/17  Study Conclusions  - Left ventricle: The cavity size was normal. There was mild focal basal hypertrophy of the septum. Mid to apical inferoseptal/anteroseptal akinesis, apical lateral akinesis, mid to apical anterior akinesis, apical inferior akinesis, akinesis of the true apex. The estimated ejection fraction was 30%. Features are consistent with a pseudonormal left ventricular filling pattern, with concomitant abnormal relaxation and increased filling pressure (grade 2 diastolic dysfunction). - Aortic valve: Trileaflet; moderately calcified leaflets. Sclerosis without stenosis. There was trivial regurgitation. - Mitral valve: Mildly calcified annulus. There was trivial regurgitation. - Right ventricle: The cavity size was normal. Systolic function was normal. - Pulmonary arteries: No complete TR doppler jet so unable to estimate PA systolic pressure. - Inferior vena cava: The vessel was normal in size. The respirophasic diameter changes  were in the normal range (= 50%), consistent with normal central venous pressure. - Pericardium, extracardiac: A trivial pericardial effusion was identified.  Impressions:  - Normal LV size with mild focal basal septal hypertrophy. EF 30% with wall motion abnormalities as noted above in LAD distribution. Normal RV size and systolic function. Aortic valve sclerosis without significant stenosis.    Patient Profile     77 y.o. female with history of diabetes mellitus who presented with acute anterior myocardial infarction, starting 24-36 hours before presentation. LAD stenting performed.  Severe hyponatremia of uncertain duration, not responding to diuresis for heart failure management.Worsening renal function for which nephrology is following.  Assessment & Plan    Principal Problem:   STEMI involving left anterior descending coronary artery [facility]) late presenting,  with DES to LAD -  No chest pain, PCI/stent to prox and mid LAD with PTCA apical LAD; with concomitant CAD continue DAPT, on BB  --no chest pain but epigastric pain, continue protonix  Active Problems:      Acute systolic HF (heart failure) (HCC) BNP today 868 decreased from 1448.  --new neg 1015  Wt at 111 down from 126 lbs at peak. 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 Hold lasix    Hypokalemia today at 3.4 will replace      AKI   Cr now 2.15  pk Cr of 3.86  Renal signed off 10/14/17     HOH (hard of hearing)--chronic    MI, acute, non ST segment elevation (HCC)--see above  Ischemic cardiomyopathy-- WF 30% on nitrates, hydralazine, furosemide and BB     Abdominal pain- seen and evaluated by GI they have signed off- stop Carafate prior to discharge and continue PPI         Anemia - Hgb 9.0 down from 10.4 Iron def.  And chronic  disease.      Cough still with cough last night       Weakness slowly improving with PT and rehab.       DM-2 on SSI  Will ask diabetes coordinator to assist.  No further metformin.        HLD   On lipitor 80 will need follow up as outpt.      Hypokalemic at 3.1 will replace and recheck in AM   We will consult a case manager for discharge planning.   Tobias Alexander, MD 10/22/2017 10:55 AM

## 2017-10-22 NOTE — Progress Notes (Signed)
Called by nurse over concern for low BP trends today. This has been an issue intermittently.   Patient received Lasix , hydralazine 12.5mg , Imdur  earlier today per orders. Carvedilol 3.125mg  was due this AM but was held appropriately per parameters. When Dr. Delton See saw patient, she recommended to stop Lasix and decrease Imdur back to . This note is keeping in mind these changes would take effect tomorrow as they had already been given today. BP was 95/59 earlier, then 120/71 around 10am, now 106/49. Nurse is concerned that if we give the afternoon dose of hydralazine that is due, BP will remain too low to accommodate any beta blocker today and I agree with this concern. It also appears that the patient has recently not been able to tolerate a full days' worth of doses of hydralazine due to soft BP as it has frequently had to be held. I advised the nurse hold further hydralazine altogether, and go ahead and give the carvedilol. This carvedilol will serve as her afternoon dose of beta blocker. I d/c'd hydralazine. We can consider readding as able, but with creatinine elevation this admission, need to be cautious. It is also noted K remains low at 3.4 prior to receiving additional Lasix today. Last potassium was given yesterday (K 3.1-> -> brought up to ). Will give one more dose today and f/u BMET in AM. Lastly, given hypotension, will recheck CBC to trend. She has been in the 8-9 range earlier this admission but on 4/22 she was up to 10.7 with last check yesterday at 9.0.  Clarke Amburn PA-C

## 2017-10-22 NOTE — Progress Notes (Signed)
CARDIAC REHAB PHASE I   PRE:  Rate/Rhythm: 80 SR  BP:  Supine:   Sitting: 115/92  Standing:    SaO2: 96%RA  MODE:  Ambulation: 470 ft   POST:  Rate/Rhythm: 93 SR  BP:  Supine:   Sitting: 120/71  Standing:    SaO2: 100%RA 1002-1025 Pt walked 470 ft on RA with rolling walker and gait belt use with fairly steady gait. Encouraged pt to stay close to walker. To recliner with son in room after walk. Notified RN that pt on brilinta and needs to see case manager prior to d/c as I did not see this addressed in notes. Did see notes on medication assistance. Daughter not here to ask if she had gotten brilinta card.   Luetta Nutting, RN BSN  10/22/2017 10:22 AM

## 2017-10-22 NOTE — Progress Notes (Addendum)
Dayna Dunn paged at this time.  Ernestina Columbia, RN

## 2017-10-23 DIAGNOSIS — R1013 Epigastric pain: Secondary | ICD-10-CM

## 2017-10-23 LAB — CBC
HEMATOCRIT: 28.6 % — AB (ref 36.0–46.0)
Hemoglobin: 9.3 g/dL — ABNORMAL LOW (ref 12.0–15.0)
MCH: 26.3 pg (ref 26.0–34.0)
MCHC: 32.5 g/dL (ref 30.0–36.0)
MCV: 81 fL (ref 78.0–100.0)
PLATELETS: 300 10*3/uL (ref 150–400)
RBC: 3.53 MIL/uL — AB (ref 3.87–5.11)
RDW: 13.6 % (ref 11.5–15.5)
WBC: 6.9 10*3/uL (ref 4.0–10.5)

## 2017-10-23 LAB — BASIC METABOLIC PANEL
Anion gap: 11 (ref 5–15)
BUN: 24 mg/dL — AB (ref 6–20)
CO2: 18 mmol/L — AB (ref 22–32)
CREATININE: 1.88 mg/dL — AB (ref 0.44–1.00)
Calcium: 8.3 mg/dL — ABNORMAL LOW (ref 8.9–10.3)
Chloride: 99 mmol/L — ABNORMAL LOW (ref 101–111)
GFR calc non Af Amer: 24 mL/min — ABNORMAL LOW (ref >60)
GFR, EST AFRICAN AMERICAN: 28 mL/min — AB (ref >60)
Glucose, Bld: 136 mg/dL — ABNORMAL HIGH (ref 65–99)
POTASSIUM: 4 mmol/L (ref 3.5–5.1)
Sodium: 128 mmol/L — ABNORMAL LOW (ref 135–145)

## 2017-10-23 LAB — GLUCOSE, CAPILLARY
GLUCOSE-CAPILLARY: 152 mg/dL — AB (ref 65–99)
Glucose-Capillary: 136 mg/dL — ABNORMAL HIGH (ref 65–99)
Glucose-Capillary: 165 mg/dL — ABNORMAL HIGH (ref 65–99)

## 2017-10-23 NOTE — Progress Notes (Signed)
Progress Note  Patient Name: Sheila White Date of Encounter: 10/23/2017  Primary Cardiologist: Nicki Guadalajara, MD   Subjective   No chest pain, but epigastric pain, doesn't want to go home yet as she feels weak.  Inpatient Medications    Scheduled Meds: . aspirin  81 mg Oral Daily  . atorvastatin  80 mg Oral q1800  . carvedilol  3.125 mg Oral BID WC  . colchicine  0.6 mg Oral Daily  . docusate sodium  100 mg Oral Daily  . fluticasone  2 spray Each Nare Daily  . insulin aspart  0-5 Units Subcutaneous QHS  . insulin aspart  0-9 Units Subcutaneous TID WC  . isosorbide mononitrate  15 mg Oral Daily  . loratadine  10 mg Oral Daily  . pantoprazole  40 mg Oral BID  . polyethylene glycol  17 g Oral Daily  . sodium chloride flush  3 mL Intravenous Q12H  . sucralfate  1 g Oral Q6H  . ticagrelor  90 mg Oral BID   Continuous Infusions: . sodium chloride     PRN Meds: sodium chloride, acetaminophen, benzonatate, diazepam, guaiFENesin-dextromethorphan, iopamidol, morphine injection, nitroGLYCERIN, ondansetron (ZOFRAN) IV, pneumococcal 23 valent vaccine, sodium chloride flush, zolpidem   Vital Signs    Vitals:   10/22/17 2131 10/23/17 0445 10/23/17 0740 10/23/17 0813  BP: 116/64 107/73 113/69   Pulse:   90   Resp: (!) 21  Temp: (!) 97.4 F (36.3 C) 98.5 F (36.9 C)  97.9 F (36.6 C)  TempSrc: Oral Oral  Oral  SpO2: 96% 96%    Weight:  110 lb 3.7 oz (50 kg)    Height:        Intake/Output Summary (Last 24 hours) at 10/23/2017 1122 Last data filed at 10/23/2017 0825 Gross per 24 hour  Intake 50 ml  Output 900 ml  Net -850 ml   Filed Weights   10/21/17 0245 10/22/17 0250 10/23/17 0445  Weight: 111 lb 3.2 oz (50.4 kg) 110 lb 8 oz (50.1 kg) 110 lb 3.7 oz (50 kg)    Telemetry    SR - Personally Reviewed  ECG    SR at 83, continues with ST elevation ant leads but improved - Personally Reviewed  Physical Exam   GEN: No acute distress.   Neck: No  JVD Cardiac: RRR, no murmurs, rubs, or gallops.  Respiratory: Clear to diminished to auscultation bilaterally. occ rhonchi , no rales.   GI: Soft, nontender, non-distended  MS: No edema; No deformity. Neuro:  Nonfocal  Psych: Normal affect   Labs    Chemistry Recent Labs  Lab 10/21/17 0319 10/22/17 0252 10/23/17 0236  NA 129* 129* 128*  K 3.1* 3.4* 4.0  CL 97* 95* 99*  CO2 20* 22 18*  GLUCOSE 141* 142* 136*  BUN 26* 25* 24*  CREATININE 2.15* 2.24* 1.88*  CALCIUM 7.6* 8.1* 8.3*  GFRNONAA 21* 20* 24*  GFRAA 24* 23* 28*  ANIONGAP Hematology Recent Labs  Lab 10/21/17 0319 10/22/17 1530 10/23/17 0236  WBC 7.1 5.9 6.9  RBC 3.34* 3.38* 3.53*  HGB 9.0* 9.2* 9.3*  HCT 27.2* 27.4* 28.6*  MCV 81.4 81.1 81.0  MCH 26.9 27.2 26.3  MCHC 33.1 33.6 32.5  RDW 14.1 13.5 13.6  PLT 292 290 300    Cardiac EnzymesNo results for input(s): TROPONINI in the last 168 hours. No results for input(s): TROPIPOC in the last 168 hours.   BNP  Recent Labs  Lab 10/17/17 1200 10/21/17 0319  BNP 1,448.2* 868.5*     DDimer No results for input(s): DDIMER in the last 168 hours.   Radiology    No results found.  Cardiac Studies   Conclusion cath 10/05/17    Mid LAD lesion is 90% stenosed.  Prox LAD lesion is 99% stenosed.  Dist LAD lesion is 99% stenosed.  1st Mrg lesion is 95% stenosed.  Ost 2nd Mrg to 2nd Mrg lesion is 30% stenosed.  Ost RPDA to RPDA lesion is 30% stenosed.  Post intervention, there is a 0% residual stenosis.  Post intervention, there is a 0% residual stenosis.  Post intervention, there is a 35% residual stenosis.  A stent was successfully placed.  A stent was successfully placed.  Acute late presentation anterior ST segment elevation myocardial infarction with demonstration of subtotal long proximal LAD stenosis, 90% mid stenosis, and 99% apical stenosis with reduced apical flow.  Left circumflex disease with 50% proximal OM1  stenosis followed by distal 95% marginal stenosis prior to giving off distal branches in the OM1 vessel.  Mild diffuse irregularity of a large dominant RCA with 30% narrowing in the PDA vessel.  LVEDP 34 mmHg/  Successful PCI to the LAD with ultimate insertion of a 2.526 mm Resolute DES stent postdilated to 2.8 mm proximally and 2.68 mm distally with the 99% stenosis being reduced to 0%; DES stenting of the mid 90% stenosis with ultimate insertion of a 2.2512 mm Resolute DES stent with residual narrowing at 0%, and initial 99% apical thrombotic stenosis with reduced flow treated with low-level PTCA and intracoronary verapamil with improvement to approximately 35-40% in a very small apical LAD segment.  RECOMMENDATION: DAPT for minimum of 1 year. Diuresis with elevated EDP. An echo Doppler study will be obtained in a.m. Patient should be started on low-dose ACE inhibition, carvedilol, probable nitrate therapy, and high potency statin treatment.     Echo 10/06/17  Study Conclusions  - Left ventricle: The cavity size was normal. There was mild focal basal hypertrophy of the septum. Mid to apical inferoseptal/anteroseptal akinesis, apical lateral akinesis, mid to apical anterior akinesis, apical inferior akinesis, akinesis of the true apex. The estimated ejection fraction was 30%. Features are consistent with a pseudonormal left ventricular filling pattern, with concomitant abnormal relaxation and increased filling pressure (grade 2 diastolic dysfunction). - Aortic valve: Trileaflet; moderately calcified leaflets. Sclerosis without stenosis. There was trivial regurgitation. - Mitral valve: Mildly calcified annulus. There was trivial regurgitation. - Right ventricle: The cavity size was normal. Systolic function was normal. - Pulmonary arteries: No complete TR doppler jet so unable to estimate PA systolic pressure. - Inferior vena cava: The vessel was  normal in size. The respirophasic diameter changes were in the normal range (= 50%), consistent with normal central venous pressure. - Pericardium, extracardiac: A trivial pericardial effusion was identified.  Impressions:  - Normal LV size with mild focal basal septal hypertrophy. EF 30% with wall motion abnormalities as noted above in LAD distribution. Normal RV size and systolic function. Aortic valve sclerosis without significant stenosis.    Patient Profile     77 y.o. female with history of diabetes mellitus who presented with acute anterior myocardial infarction, starting 24-36 hours before presentation. LAD stenting performed.  Severe hyponatremia of uncertain duration, not responding to diuresis for heart failure management.Worsening renal function for which nephrology is following.  Assessment & Plan    Principal Problem:   STEMI involving left anterior descending coronary  artery (HCC) late presenting, with DES to LAD -  No chest pain, PCI/stent to prox and mid LAD with PTCA apical LAD; with concomitant CAD continue DAPT, on BB  --no chest pain but epigastric pain, continue protonix  Active Problems:      Acute systolic HF (heart failure) (HCC) BNP today 868 decreased from 1448.  --new neg 1015  Wt at 111 down from 126 lbs at peak. --on lasix 20 mg daily-> developed hyponatremia and hypotension, improving after discontinuation     Hyponatremia now 128, hold lasix    AKI   Improving, today 1.88, peak Cr of 3.86,   Renal signed off 10/14/17     HOH (hard of hearing)--chronic    MI, acute, non ST segment elevation (HCC)--see above  Ischemic cardiomyopathy-- WF 30% on nitrates, hydralazine, furosemide and BB     Abdominal pain- seen and evaluated by GI they have signed off- stop Carafate prior to discharge and continue PPI         Anemia - Hgb 9.0 down from 10.4 Iron def.  And chronic disease.      Cough still with cough last night       Weakness  slowly improving with PT and rehab.       DM-2 on SSI  Will ask diabetes coordinator to assist.  No further metformin.        HLD   On lipitor 80 will need follow up as outpt.      Hypokalemic at 3.1 will replace and recheck in AM   The patient feels weak and is reluctant to go home, we will start physical therapy and plan for a discharge tomorrow, she prefers tp go home and not to rehab, she lives with her daughter. Communication arranged with an interpreter. Total time spent with the patient 35 minutes.  Tobias Alexander, MD 10/23/2017 11:22 AM

## 2017-10-23 NOTE — Progress Notes (Signed)
Pharmacist Heart Failure Core Measure Documentation  Assessment: Sheila White has an EF documented as 30% on 10/06/2017 by ECHO.  Rationale: Heart failure patients with left ventricular systolic dysfunction (LVSD) and an EF < 40% should be prescribed an angiotensin converting enzyme inhibitor (ACEI) or angiotensin receptor blocker (ARB) at discharge unless a contraindication is documented in the medical record.  This patient is not currently on an ACEI or ARB for HF.  This note is being placed in the record in order to provide documentation that a contraindication to the use of these agents is present for this encounter.  ACE Inhibitor or Angiotensin Receptor Blocker is contraindicated (specify all that apply)    ACEI allergy AND ARB allergy   Angioedema   Moderate or severe aortic stenosis   Hyperkalemia   Hypotension   Renal artery stenosis   Worsening renal function, preexisting renal disease or dysfunction   Girard Cooter, PharmD Clinical Pharmacist  Pager: [phone] Phone: [phone]  10/23/2017 4:57 PM

## 2017-10-23 NOTE — Progress Notes (Signed)
Inpatient Diabetes Program Recommendations  AACE/ADA: New Consensus Statement on Inpatient Glycemic Control (2015)  Target Ranges:  Prepandial:   less than 140 mg/dL      Peak postprandial:   less than 180 mg/dL (1-2 hours)      Critically ill patients:  140 - 180 mg/dL   Results for LEEANNA, SLABY (MRN [id number]) as of 10/23/2017 11:28  Ref. Range 10/22/2017 06:07 10/22/2017 11:36 10/22/2017 17:02 10/22/2017 21:30  Glucose-Capillary Latest Ref Range: 65 - 99 mg/dL 962 (H) 952 (H) 841 (H) 145 (H)   Results for MEAGAN, ANCONA (MRN [id number]) as of 10/23/2017 11:28  Ref. Range 10/23/2017 06:34  Glucose-Capillary Latest Ref Range: 65 - 99 mg/dL 253 (H)   Results for REEDA, SOOHOO (MRN [id number]) as of 10/23/2017 11:28  Ref. Range 10/05/2017 21:05  Hemoglobin A1C Latest Ref Range: 4.8 - 5.6 % 6.6 (H)     Home DM Meds: Metformin 1000 mg daily       Amaryl 2 mg daily  Current Insulin Orders: Novolog Sensitive Correction Scale/ SSI (0-9 units) TID AC + HS           Note Cardiology team recommendations to stop Metformin at home.  Agree.  Can continue Amaryl at home but would not increase dose given renal function.  May consider adding DPP-4 inhibitor to pt's home DM regimen.  Recommend Tradjenta 5 mg daily.  Tradjenta is glucose dependent (lower risk of Hypoglycemia with this agent) and does not have to renally dosed.      --Will follow patient during hospitalization--  Ambrose Finland RN, MSN, CDE Diabetes Coordinator Inpatient Glycemic Control Team Team Pager: [phone] (8a-5p)

## 2017-10-24 DIAGNOSIS — R059 Cough, unspecified: Secondary | ICD-10-CM

## 2017-10-24 DIAGNOSIS — R05 Cough: Secondary | ICD-10-CM

## 2017-10-24 LAB — GLUCOSE, CAPILLARY
GLUCOSE-CAPILLARY: 148 mg/dL — AB (ref 65–99)
Glucose-Capillary: 141 mg/dL — ABNORMAL HIGH (ref 65–99)
Glucose-Capillary: 209 mg/dL — ABNORMAL HIGH (ref 65–99)
Glucose-Capillary: 169 mg/dL — ABNORMAL HIGH (ref 65–99)

## 2017-10-24 MED ORDER — NITROGLYCERIN 0.4 MG SL SUBL: 0.4000 mg | SUBLINGUAL_TABLET | SUBLINGUAL | 0 refills | Status: DC | PRN

## 2017-10-24 MED ORDER — LORATADINE 10 MG PO TABS: 10.0000 mg | ORAL_TABLET | Freq: Every day | ORAL | 2 refills | Status: DC

## 2017-10-24 MED ORDER — TICAGRELOR 90 MG PO TABS: 90.0000 mg | ORAL_TABLET | Freq: Two times a day (BID) | ORAL | 4 refills | Status: DC

## 2017-10-24 MED ORDER — CARVEDILOL 3.125 MG PO TABS: 3.1250 mg | ORAL_TABLET | Freq: Two times a day (BID) | ORAL | 2 refills | Status: DC

## 2017-10-24 MED ORDER — ASPIRIN 81 MG PO CHEW: 81.0000 mg | CHEWABLE_TABLET | Freq: Every day | ORAL | Status: DC

## 2017-10-24 MED ORDER — ISOSORBIDE MONONITRATE ER 30 MG PO TB24: 15.0000 mg | ORAL_TABLET | Freq: Every day | ORAL | 3 refills | Status: DC

## 2017-10-24 MED ORDER — COLCHICINE 0.6 MG PO TABS: 0.6000 mg | ORAL_TABLET | Freq: Every day | ORAL | 0 refills | Status: DC

## 2017-10-24 MED ORDER — PANTOPRAZOLE SODIUM 40 MG PO TBEC: 40.0000 mg | DELAYED_RELEASE_TABLET | Freq: Two times a day (BID) | ORAL | 3 refills | Status: DC

## 2017-10-24 MED ORDER — ASPIRIN 81 MG PO CHEW: 81.0000 mg | CHEWABLE_TABLET | Freq: Every day | ORAL | 2 refills | Status: DC

## 2017-10-24 MED ORDER — ATORVASTATIN CALCIUM 80 MG PO TABS: 80.0000 mg | ORAL_TABLET | Freq: Every day | ORAL | 3 refills | Status: DC

## 2017-10-24 MED ORDER — LINAGLIPTIN 5 MG PO TABS: 5.0000 mg | ORAL_TABLET | Freq: Every day | ORAL | 3 refills | Status: DC

## 2017-10-24 NOTE — Progress Notes (Signed)
Progress Note  Patient Name: Sheila White Date of Encounter: 10/24/2017  Primary Cardiologist: Nicki Guadalajara, MD   Subjective   No chest pain, feels better today.  Inpatient Medications    Scheduled Meds: . aspirin  81 mg Oral Daily  . atorvastatin  80 mg Oral q1800  . carvedilol  3.125 mg Oral BID WC  . colchicine  0.6 mg Oral Daily  . docusate sodium  100 mg Oral Daily  . fluticasone  2 spray Each Nare Daily  . insulin aspart  0-5 Units Subcutaneous QHS  . insulin aspart  0-9 Units Subcutaneous TID WC  . isosorbide mononitrate  15 mg Oral Daily  . loratadine  10 mg Oral Daily  . pantoprazole  40 mg Oral BID  . polyethylene glycol  17 g Oral Daily  . sodium chloride flush  3 mL Intravenous Q12H  . sucralfate  1 g Oral Q6H  . ticagrelor  90 mg Oral BID   Continuous Infusions: . sodium chloride     PRN Meds: sodium chloride, acetaminophen, benzonatate, diazepam, guaiFENesin-dextromethorphan, iopamidol, morphine injection, nitroGLYCERIN, ondansetron (ZOFRAN) IV, pneumococcal 23 valent vaccine, sodium chloride flush, zolpidem   Vital Signs    Vitals:   10/24/17 0450 10/24/17 0530 10/24/17 0700 10/24/17 0800  BP: 119/67   114/77  Pulse:    96  Resp: 17  (!) 24 15  Temp: 98.8 F (37.1 C)   98.4 F (36.9 C)  TempSrc: Oral   Oral  SpO2: 98%   99%  Weight:  111 lb 6.4 oz (50.5 kg)    Height:        Intake/Output Summary (Last 24 hours) at 10/24/2017 1101 Last data filed at 10/24/2017 0730 Gross per 24 hour  Intake 935 ml  Output -  Net 935 ml   Filed Weights   10/22/17 0250 10/23/17 0445 10/24/17 0530  Weight: 110 lb 8 oz (50.1 kg) 110 lb 3.7 oz (50 kg) 111 lb 6.4 oz (50.5 kg)    Telemetry    SR - Personally Reviewed  ECG    SR at 83, continues with ST elevation ant leads but improved - Personally Reviewed  Physical Exam   GEN: No acute distress.   Neck: No JVD Cardiac: RRR, no murmurs, rubs, or gallops.  Respiratory: Clear to diminished to  auscultation bilaterally. occ rhonchi , no rales.   GI: Soft, nontender, non-distended  MS: No edema; No deformity. Neuro:  Nonfocal  Psych: Normal affect   Labs    Chemistry Recent Labs  Lab 10/21/17 0319 10/22/17 0252 10/23/17 0236  NA 129* 129* 128*  K 3.1* 3.4* 4.0  CL 97* 95* 99*  CO2 20* 22 18*  GLUCOSE 141* 142* 136*  BUN 26* 25* 24*  CREATININE 2.15* 2.24* 1.88*  CALCIUM 7.6* 8.1* 8.3*  GFRNONAA 21* 20* 24*  GFRAA 24* 23* 28*  ANIONGAP Hematology Recent Labs  Lab 10/21/17 0319 10/22/17 1530 10/23/17 0236  WBC 7.1 5.9 6.9  RBC 3.34* 3.38* 3.53*  HGB 9.0* 9.2* 9.3*  HCT 27.2* 27.4* 28.6*  MCV 81.4 81.1 81.0  MCH 26.9 27.2 26.3  MCHC 33.1 33.6 32.5  RDW 14.1 13.5 13.6  PLT 292 290 300    Cardiac EnzymesNo results for input(s): TROPONINI in the last 168 hours. No results for input(s): TROPIPOC in the last 168 hours.   BNP Recent Labs  Lab 10/17/17 1200 10/21/17 0319  BNP 1,448.2* 868.5*  DDimer No results for input(s): DDIMER in the last 168 hours.   Radiology    No results found.  Cardiac Studies   Conclusion cath 10/05/17    Mid LAD lesion is 90% stenosed.  Prox LAD lesion is 99% stenosed.  Dist LAD lesion is 99% stenosed.  1st Mrg lesion is 95% stenosed.  Ost 2nd Mrg to 2nd Mrg lesion is 30% stenosed.  Ost RPDA to RPDA lesion is 30% stenosed.  Post intervention, there is a 0% residual stenosis.  Post intervention, there is a 0% residual stenosis.  Post intervention, there is a 35% residual stenosis.  A stent was successfully placed.  A stent was successfully placed.  Acute late presentation anterior ST segment elevation myocardial infarction with demonstration of subtotal long proximal LAD stenosis, 90% mid stenosis, and 99% apical stenosis with reduced apical flow.  Left circumflex disease with 50% proximal OM1 stenosis followed by distal 95% marginal stenosis prior to giving off distal branches in  the OM1 vessel.  Mild diffuse irregularity of a large dominant RCA with 30% narrowing in the PDA vessel.  LVEDP 34 mmHg/  Successful PCI to the LAD with ultimate insertion of a 2.526 mm Resolute DES stent postdilated to 2.8 mm proximally and 2.68 mm distally with the 99% stenosis being reduced to 0%; DES stenting of the mid 90% stenosis with ultimate insertion of a 2.2512 mm Resolute DES stent with residual narrowing at 0%, and initial 99% apical thrombotic stenosis with reduced flow treated with low-level PTCA and intracoronary verapamil with improvement to approximately 35-40% in a very small apical LAD segment.  RECOMMENDATION: DAPT for minimum of 1 year. Diuresis with elevated EDP. An echo Doppler study will be obtained in a.m. Patient should be started on low-dose ACE inhibition, carvedilol, probable nitrate therapy, and high potency statin treatment.     Echo 10/06/17  Study Conclusions  - Left ventricle: The cavity size was normal. There was mild focal basal hypertrophy of the septum. Mid to apical inferoseptal/anteroseptal akinesis, apical lateral akinesis, mid to apical anterior akinesis, apical inferior akinesis, akinesis of the true apex. The estimated ejection fraction was 30%. Features are consistent with a pseudonormal left ventricular filling pattern, with concomitant abnormal relaxation and increased filling pressure (grade 2 diastolic dysfunction). - Aortic valve: Trileaflet; moderately calcified leaflets. Sclerosis without stenosis. There was trivial regurgitation. - Mitral valve: Mildly calcified annulus. There was trivial regurgitation. - Right ventricle: The cavity size was normal. Systolic function was normal. - Pulmonary arteries: No complete TR doppler jet so unable to estimate PA systolic pressure. - Inferior vena cava: The vessel was normal in size. The respirophasic diameter changes were in the normal range (=  50%), consistent with normal central venous pressure. - Pericardium, extracardiac: A trivial pericardial effusion was identified.  Impressions:  - Normal LV size with mild focal basal septal hypertrophy. EF 30% with wall motion abnormalities as noted above in LAD distribution. Normal RV size and systolic function. Aortic valve sclerosis without significant stenosis.    Patient Profile     77 y.o. female with history of diabetes mellitus who presented with acute anterior myocardial infarction, starting 24-36 hours before presentation. LAD stenting performed.  Severe hyponatremia of uncertain duration, not responding to diuresis for heart failure management.Worsening renal function for which nephrology is following.  Assessment & Plan    Principal Problem:   STEMI involving left anterior descending coronary artery (HCC) late presenting, with DES to LAD -  No chest pain, PCI/stent to prox  and mid LAD with PTCA apical LAD; with concomitant CAD continue DAPT, on BB  --no chest pain but epigastric pain, continue protonix  Active Problems:      Acute systolic HF (heart failure) (HCC) BNP today 868 decreased from 1448.  --new neg 1015  Wt at 111 down from 126 lbs at peak. --on lasix 20 mg daily-> developed hyponatremia and hypotension, improving after discontinuation, I would continue to hold.     Hyponatremia now 128, hold lasix    AKI   Improving, today 1.88, peak Cr of 3.86,   Renal signed off 10/14/17     HOH (hard of hearing)--chronic    MI, acute, non ST segment elevation (HCC)--see above  Ischemic cardiomyopathy-- WF 30% on nitrates, hydralazine, furosemide and BB     Abdominal pain- seen and evaluated by GI they have signed off- stop Carafate prior to discharge and continue PPI         Anemia - Hgb 9.0 down from 10.4 Iron def.  And chronic disease.      Cough still with cough last night       Weakness slowly improving with PT and rehab.       DM-2 on SSI   Will ask diabetes coordinator to assist.  No further metformin.        HLD   On lipitor 80 will need follow up as outpt.      Hypokalemic at 3.1 will replace and recheck in AM   The patient feels better, we will assess home situation, arrange for home help, PT, and discharge. We will arrange for an outpatient follow up.  Tobias Alexander, MD 10/24/2017 11:01 AM

## 2017-10-24 NOTE — Progress Notes (Signed)
CARDIAC REHAB PHASE I   Pt just walked with PT and now sound asleep. Spoke with CM and daughter to ensure that pt is getting Brilinta and the services that she needs. CM to speak with daughter [place]. Pt ready for d/c. No questions regarding education. [id number] Sheila White CES, ACSM 2:17 PM 10/24/2017

## 2017-10-24 NOTE — Progress Notes (Signed)
Inpatient Diabetes Program Recommendations  AACE/ADA: New Consensus Statement on Inpatient Glycemic Control (2015)  Target Ranges:  Prepandial:   less than 140 mg/dL      Peak postprandial:   less than 180 mg/dL (1-2 hours)      Critically ill patients:  140 - 180 mg/dL  Results for [name], [name] (MRN [id number]) as of 10/24/2017 09:09  Ref. Range 10/23/2017 06:34 10/23/2017 11:31 10/23/2017 16:26 10/23/2017 21:22 10/24/2017 06:33  Glucose-Capillary Latest Ref Range: 65 - 99 mg/dL 782 (H) 956 (H) 213 (H) 152 (H) 141 (H)  Results for [name], [name] (MRN [id number]) as of 10/24/2017 09:09  Ref. Range 10/05/2017 21:05  Hemoglobin A1C Latest Ref Range: 4.8 - 5.6 % 6.6 (H)    Review of Glycemic Control  Home DM Meds: Metformin 1000 mg daily; Amaryl 2 mg daily Current Insulin Orders: Novolog 0-9 units TID with meals, Novolog 0-5 units QHS                                       Outpatient DM medication regimen: Agree with discontinuing Metformin. Recommend continuing Amaryl 2 mg daily and consider adding DPP-4 inhibitor to pt's home DM regimen. Recommend Tradjenta 5 mg daily.  Tradjenta is glucose dependent (lower risk of Hypoglycemia with this agent) and does not have to be renally dosed.  Thanks, Orlando Penner, RN, MSN, CDE Diabetes Coordinator Inpatient Diabetes Program [phone] (Team Pager from 8am to 5pm)

## 2017-10-24 NOTE — Care Management Note (Signed)
Case Management Note Previous CM note completed by Sheila Parr, RN 10/07/2017, 10:14 AM    Patient Details  Name: Sheila White MRN: [id number] Date of Birth: [birth year]  Subjective/Objective:       Pt admitted with MI - post infarct pericard            Action/Plan:  PTA independent from home with daughter - pt recently moved to [place] and does not have insurance.  CM was able to secure appt at [facility].  CM spoke with pt/daughter and son at bedside (daughter interpreted).  CM explained the PCP appt and that pt will also need to go to clinic on 4/18 and apply for orange card - all information provided on AVS.  Pt will need to be MATCH at discharge.  CM discussed potential concern with NP Sheila White that pt may have gap in medication coverage post [facility] inventory and medication assistance being provided by [employer] - NP will follow pt closer to discharge for contingency plan for medications    Additional CM follow up notes:  10/12/17- 1030- Sheila Thibault RN, CM-  Per PT eval pt wound benefit from HHPT and DME needs- RW and 3n1-  Per son family will be available 24/7 to assist pt at home- pt will need orders for HHPT and DME prior to discharge- once placed will have [facility] assess pt to see if pt will qualify for charity care- DME can be delivered to room prior to discharge. CM will continue to follow for mediation needs- MATCH letter to be provided prior to discharge. CM called [facility] to have orange card appointment changed from 4/18 to 5/16- clinic to call pt/family with time for appointment post discharge.   Expected Discharge Date:                  Expected Discharge Plan:  Home w Home Health Services  In-House Referral:  NA  Discharge planning Services  CM Consult  Post Acute Care Choice:  Durable Medical Equipment, Home Health Choice offered to:  Adult Children  DME Arranged:  Walker rolling, 3-N-1 DME Agency:  [facility]  HH Arranged:  PT, RN,  Disease Management HH Agency:  [facility]  Status of Service:  Completed, signed off  If discussed at Long Length of Stay Meetings, dates discussed:    Discharge Disposition: home/home health   Additional Comments:  10/24/17- 1600- Sheila Slavick RN, CM- pt for d/c home today with daughter- per conversation with daughter pt does have visa and will need to renew soon- may need MD note in order to delay this- daughter to speak with MD at f/u visit. Orders have been placed for HHRN/PT- Sheila White with [facility] aware - pt has been approved for charity care with [facility]- they will f/u for home visits- DME orders also placed- notified Sheila White with [facility] for DME needs- RW and 3n1 to be delivered to room prior to discharge- discussed with daughter Sheila White needs 30 day free card provided with instructions for use along with pt assistance application and instructions to f/u with cardiology on f/u- also to call toll free # on brilinta card to ask about eligibility with visa. Pt will also need medication assistance with other meds- CM provided MATCH letter to daughter- and have requested paper scripts to use with [facility] letter -list of pharmacies provided to use with MATCH - with $3 copay per script. - pt will need to f/u with regard to DM  mediation as she will not be able to afford new med tradjenta.   Donn White [place], RN 10/24/2017, 4:14 PM [phone] 4E Transition Care Coordinator

## 2017-10-24 NOTE — Progress Notes (Signed)
Physical Therapy Treatment Patient Details Name: Sheila White MRN: [id number] DOB: 05-Jun-1941 Today's Date: 10/24/2017    History of Present Illness Patient is a 77 y.o. F with significant PMH of diabetes mellitus who presented to ED with chest pain and found to have an acute anterior myocardial infarction. Treated with angioplasty and stent. Devleoped post infarction pericarditis and hyponatremia.     PT Comments    Pt doing very well with mobility. Ready for dc home from PT standpoint.    Follow Up Recommendations  Home health PT;Supervision - Intermittent     Equipment Recommendations  Rolling walker with 5" wheels;3in1 (PT)    Recommendations for Other Services       Precautions / Restrictions Precautions Precautions: Fall Restrictions Weight Bearing Restrictions: No    Mobility  Bed Mobility               General bed mobility comments: OOB in recliner  Transfers Overall transfer level: Modified independent Equipment used: Rolling walker (2 wheeled) Transfers: Sit to/from Stand Sit to Stand: Modified independent (Device/Increase time)            Ambulation/Gait Ambulation/Gait assistance: Supervision Ambulation Distance (Feet): 250 Feet Assistive device: Rolling walker (2 wheeled) Gait Pattern/deviations: Decreased stride length;Step-through pattern Gait velocity: decreased Gait velocity interpretation: 1.31 - 2.62 ft/sec, indicative of limited community ambulator General Gait Details: Steady gait with walker   Stairs             Wheelchair Mobility    Modified Rankin (Stroke Patients Only)       Balance Overall balance assessment: Needs assistance Sitting-balance support: No upper extremity supported;Feet supported Sitting balance-Leahy Scale: Good     Standing balance support: No upper extremity supported;During functional activity Standing balance-Leahy Scale: Fair                              Cognition  Arousal/Alertness: Awake/alert Behavior During Therapy: WFL for tasks assessed/performed Overall Cognitive Status: Within Functional Limits for tasks assessed                                        Exercises      General Comments        Pertinent Vitals/Pain Pain Assessment: Faces Faces Pain Scale: No hurt    Home Living                      Prior Function            PT Goals (current goals can now be found in the care plan section) Progress towards PT goals: Progressing toward goals    Frequency    Min 3X/week      PT Plan Current plan remains appropriate    Co-evaluation              AM-PAC PT "6 Clicks" Daily Activity  Outcome Measure  Difficulty turning over in bed (including adjusting bedclothes, sheets and blankets)?: A Little Difficulty moving from lying on back to sitting on the side of the bed? : A Little Difficulty sitting down on and standing up from a chair with arms (e.g., wheelchair, bedside commode, etc,.)?: A Little Help needed moving to and from a bed to chair (including a wheelchair)?: A Little Help needed walking in hospital room?: A Little Help needed climbing 3-5 steps with  a railing? : A Little 6 Click Score: 18    End of Session   Activity Tolerance: Patient tolerated treatment well Patient left: in chair;with call bell/phone within reach;with family/visitor present Nurse Communication: Mobility status PT Visit Diagnosis: Unsteadiness on feet (R26.81);Muscle weakness (generalized) (M62.81);Difficulty in walking, not elsewhere classified (R26.2)     Time: 1218-1228 PT Time Calculation (min) (ACUTE ONLY): 10 min  Charges:  $Gait Training: 8-22 mins                    G Codes:       [facility] PT [phone]    Angelina Ok [facility] 10/24/2017, 2:10 PM

## 2017-10-24 NOTE — Discharge Summary (Signed)
Discharge Summary    Patient ID: Sheila White,  MRN: [id number], DOB/AGE: 05-May-1941 77 y.o.  Admit date: 10/05/2017 Discharge date: 10/24/2017  Primary Care Provider: No primary care provider on file. Primary Cardiologist: Nicki Guadalajara, MD  Discharge Diagnoses    Principal Problem:   STEMI involving left anterior descending coronary artery [facility]) Active Problems:   STEMI (ST elevation myocardial infarction) (HCC)   Dyspnea   HOH (hard of hearing)   MI, acute, non ST segment elevation (HCC)   Ischemic cardiomyopathy   Abdominal pain   Acute systolic HF (heart failure) (HCC)   Hyponatremia   Cough  Allergies No Known Allergies  Diagnostic Studies/Procedures    Cath: 10/05/17  Conclusion     Mid LAD lesion is 90% stenosed.  Prox LAD lesion is 99% stenosed.  Dist LAD lesion is 99% stenosed.  1st Mrg lesion is 95% stenosed.  Ost 2nd Mrg to 2nd Mrg lesion is 30% stenosed.  Ost RPDA to RPDA lesion is 30% stenosed.  Post intervention, there is a 0% residual stenosis.  Post intervention, there is a 0% residual stenosis.  Post intervention, there is a 35% residual stenosis.  A stent was successfully placed.  A stent was successfully placed.  Acute late presentation anterior ST segment elevation myocardial infarction with demonstration of subtotal long proximal LAD stenosis, 90% mid stenosis, and 99% apical stenosis with reduced apical flow.  Left circumflex disease with 50% proximal OM1 stenosis followed by distal 95% marginal stenosis prior to giving off distal branches in the OM1 vessel.  Mild diffuse irregularity of a large dominant RCA with 30% narrowing in the PDA vessel.  LVEDP 34 mmHg/  Successful PCI to the LAD with ultimate insertion of a 2.526 mm Resolute DES stent postdilated to 2.8 mm proximally and 2.68 mm distally with the 99% stenosis being reduced to 0%; DES stenting of the mid 90% stenosis with ultimate insertion of a 2.2512 mm  Resolute DES stent with residual narrowing at 0%, and initial 99% apical thrombotic stenosis with reduced flow treated with low-level PTCA and intracoronary verapamil with improvement to approximately 35-40% in a very small apical LAD segment.  RECOMMENDATION: DAPT for minimum of 1 year. Diuresis with elevated EDP. An echo Doppler study will be obtained in a.m. Patient should be started on low-dose ACE inhibition, carvedilol, probable nitrate therapy, and high potency statin treatment.   TTE: 10/06/17  Study Conclusions  - Left ventricle: The cavity size was normal. There was mild focal basal hypertrophy of the septum. Mid to apical inferoseptal/anteroseptal akinesis, apical lateral akinesis, mid to apical anterior akinesis, apical inferior akinesis, akinesis of the true apex. The estimated ejection fraction was 30%. Features are consistent with a pseudonormal left ventricular filling pattern, with concomitant abnormal relaxation and increased filling pressure (grade 2 diastolic dysfunction). - Aortic valve: Trileaflet; moderately calcified leaflets. Sclerosis without stenosis. There was trivial regurgitation. - Mitral valve: Mildly calcified annulus. There was trivial regurgitation. - Right ventricle: The cavity size was normal. Systolic function was normal. - Pulmonary arteries: No complete TR doppler jet so unable to estimate PA systolic pressure. - Inferior vena cava: The vessel was normal in size. The respirophasic diameter changes were in the normal range (= 50%), consistent with normal central venous pressure. - Pericardium, extracardiac: A trivial pericardial effusion was identified.  Impressions:  - Normal LV size with mild focal basal septal hypertrophy. EF 30% with wall motion abnormalities as noted above in LAD distribution. Normal RV size and  systolic function. Aortic valve sclerosis without significant stenosis.  History of  Present Illness     [name] is a 77 year old female who speaks minimal English and is hard of hearing with no prior CAD hx. Pt's presenting symptoms and history of present illness obtained from admitting MD, interpreter and family at bedside per interview. Patient states she began feeling poorly approximately 1 week prior to admssion.  She was seen by her PCP and told that she was "okay ". She continued to feel poorly including shortness of breath with right back pain.  Her daughter proceeded to take her back to her PCP on 10/04/2017 where she was placed on antibiotics for suspected CAP.  At approximately 5AM on 10/05/2017 the patient developed worsening back pain which radiated to her chest. This lasted until approximately 10 AM.  Given her symptoms, EMS was called for transport to [facility] for further evaluation.  In the ED, her EKG showed anterior Q waves with anterior ST elevation. She was examined and interviewed by Dr. Tresa Endo and plans were made to transport her to the cardiac cath lab given her STEMI presentation.  Nitroglycerin and O2 was provided by EMS which did help her symptoms but she remained short of breath at rest with ongoing back pain.   Hospital Course    On 10/05/2017, the patient was taken to the cardiac cath Lab for an acute late presentation (> 24H) anterior ST segment elevation myocardial infarction with demonstration of subtotal long proximal LAD stenosis, 90% mid stenosis, and 99% apical stenosis with reduced apical flow.  There was left circumflex disease with 50% proximal OM1 stenosis followed by a distal 95% marginal stenosis prior to giving off distal branches in the OM1 vessel.  There was also mild diffuse irregularity of a large dominant RCA with 30% narrowing in the PDA vessel with LVEDP 34 mmHg.  Successful DES PCI to the mid LAD ( 90% stenosis) and apical thrombotic stenosis with reduced flow treated with low-level PTCA and intracoronary verapamil with improvement to  approximately 35 to 40% and a very small apical LAD segment.   Echocardiogram completed 10/06/2017 with mild focal basal hypertrophy in the septum, mid to apical inferoseptal/anterior septal akinesis, apical lateral akinesis, mid to apical anterior akinesis, apical inferior akinesis, akinesis of the true apex.  LVEF estimated at 30% with grade 2 diastolic dysfunction.  Echocardiogram demonstrated anterior apical infarction with thinning of myocardium suggesting an old infarction greater than 24-48 hours.    Hospital course and plan will be outlined below given the complicated nature of her stay: Principle problem: 1. CAD with late presenting anterior myocardial infarction: -Status post PCI of LAD and angioplasty with continued postinfarction chest discomfort thought to initially be related post infarction pericarditis for which she was treated with decadron and colchine. She will remain on colchicine for 13 more days. No prednisone ordered per Dr. Delton See. She is now asymptomatic  -She was started on ASA and [place] for DAPT indefinitely -BP remained mildly low most of her hospital stay limiting medical therapy -High intensity statin and low-dose beta-blocker  Active problems: 2. Acute systolic heart failure/ischemic cardiomyopathy: Echocardiogram performed 10/06/17 with mid to apical inferoseptal/anteroseptal akinesis, apical lateral akinesis, mid to apical anterior akinesis, apical inferior akinesis, akinesis of the true apex. The estimated ejection fraction was 30%. BNP peaking at greater than 3,000 on admission. On 10/21/17 her BNP was down to 868. Her weight is down to 111lb from 129lb at peak. Given persistent hyponatremia and hypotension, her lasix  was stopped for a period. This has not been restarted.   3. Hyponatremia: Continued hyponatremia during this hospitalization for which she has been asymptomatic. 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 She required 3% NS 10/09/17 which resulted in a good response with water diuresis.  Her Na improved to130 off 3% and also Lasix and she appeared to be autodiuresing. Her Na+ on day of discharge is 128. Her presenting Na+ was 116.  She has some improvement over her course.    4. Hypokalemia: Has remained stable. K+ at discharge is 4.0  5. Acute kidney injury: Suspected to be hemodynamically mediated from CHF exacerbation/renal hypoperfusion. Creatinine peaked at 3.86. Nephrology consulted who believe this was contrast induced kidney injury. This has progressively been improving over time. On day of discharge, her creatinine is 1.88. This will need to be monitored at her follow up visit.   6. Hard of hearing: Chronic and ongoing   7.  Abdominal pain: GI consulted with initial US showed a distended gallbladder but no overt cholecystitis or gallstones. LFTs / lipase normal. CT scan without contrast due to renal function, showed distended gallbladder with some sludge but no other pathology. Empirical treatement for possible PUD with BID PPI and carafate. HIDA scan with no evidence of cholecystitis. Recommendations per GI to stop Carafate prior to discharge and continue PPI     8. Anemia: Iron deficient anemia with Hgb of 9.3 on day of discharge. Her peak HB was 10.7. Likely chronic disease mediated. Will need monitoring with CBC at follow up.   9. Cough: Resolved   10. Weakness: Pt has been working with cardiac rehabilitation and PT/OT evaluation prior to discharge who recommend strict fall precautions. She will need home health PT with intermittent supervision. It has also been recommended that she receive a rolling walker for home ambulation. Orders will be written   11. DM2: Currently on SSI while inpatient. DM coordinator recommends discontinuing home Metformin, then continuing Amaryl  2mg  PO QD and adding Tradjenta 5mg  PO QD as this is glucose dependent with a lower risk of hypoglycemia and dose not need to be renally dosed.  12. HLD: Continue high intensity statin   13. Hypokalemia: K+ 4.0 today  Consultants: Nephrology and GI  The patient was seen and examined by Dr. Delton See who feels that she is stable and ready for discharge today, 10/24/17. All follow up appointments have been made. Pt has been seen and examined by PT for home evaluation.  _____________  Discharge Vitals Blood pressure 113/69, pulse 86, temperature 98.5 F (36.9 C), temperature source Oral, resp. rate 16, height 5' (1.524 m), weight 111 lb 6.4 oz (50.5 kg), SpO2 98 %.  Filed Weights   10/22/17 0250 10/23/17 0445 10/24/17 0530  Weight: 110 lb 8 oz (50.1 kg) 110 lb 3.7 oz (50 kg) 111 lb 6.4 oz (50.5 kg)    Labs & Radiologic Studies    CBC Recent Labs    10/22/17 1530 10/23/17 0236  WBC 5.9 6.9  HGB 9.2* 9.3*  HCT 27.4* 28.6*  MCV 81.1 81.0  PLT 290 300   Basic Metabolic Panel Recent Labs    16/10/96 0252 10/23/17 0236  NA 129* 128*  K 3.4* 4.0  CL 95* 99*  CO2 22 18*  GLUCOSE 142* 136*  BUN 25* 24*  CREATININE 2.24* 1.88*  CALCIUM 8.1* 8.3*  _____________  Ct Abdomen Pelvis Wo Contrast  Result Date: 10/12/2017 CLINICAL DATA:  Abdominal distension.  Upper abdominal pain. EXAM: CT ABDOMEN AND PELVIS WITHOUT CONTRAST TECHNIQUE: Multidetector CT imaging of the abdomen and pelvis was performed following the standard protocol without IV contrast. COMPARISON:  Ultrasound 10/11/2017 FINDINGS: Lower chest: Lung bases are clear. Hepatobiliary: No focal hepatic lesion. No biliary duct dilatation. Gallbladder is mildly distended but within normal limits. Probable sludge within the fundus (sagittal image 68) no pericholecystic fluid inflammation identified on noncontrast exam. Common bile duct normal caliber. Pancreas: Pancreas is normal. No ductal dilatation. No pancreatic inflammation.  Spleen: Normal spleen Adrenals/urinary tract: Adrenal glands and kidneys are normal. The RIGHT kidney smaller than LEFT. No obstructive uropathy. Gas within the bladder. Foley catheter within the bladder. Stomach/Bowel: Stomach, small bowel, appendix, and cecum are normal. The colon and rectosigmoid colon are normal. Vascular/Lymphatic: Abdominal aorta is normal caliber with atherosclerotic calcification. There is no retroperitoneal or periportal lymphadenopathy. No pelvic lymphadenopathy. Reproductive: Uterus normal for age Other: No free fluid. Musculoskeletal: No aggressive osseous lesion. IMPRESSION: 1. Large bilateral pleural effusions with dense bibasilar passive atelectasis. 2. No acute findings in abdomen pelvis. 3. Gallbladder sludge and mild distension. 4.  Aortic Atherosclerosis (ICD10-I70.0). 5. Foley catheter in bladder. Electronically Signed   By: Genevive Bi M.D.   On: 10/12/2017 16:52   Dg Chest 2 View  Result Date: 10/18/2017 CLINICAL DATA:  Dry cough EXAM: CHEST - 2 VIEW COMPARISON:  10/06/2017 FINDINGS: Small pleural effusions. Cardiomegaly with mild vascular congestion. Bibasilar airspace disease. Aortic atherosclerosis. No pneumothorax. IMPRESSION: 1. Small bilateral pleural effusions with bibasilar atelectasis or pneumonia 2. Cardiomegaly with mild vascular congestion. Overall improved aeration and decreased edema since comparison radiograph from 10/06/2017. Electronically Signed   By: Jasmine Pang M.D.   On: 10/18/2017 20:59   Nm Hepatobiliary Liver Func  Result Date: 10/14/2017 CLINICAL DATA:  History of abdominal pain. Following ingestion of Ensure, patient had copious diarrhea and refused further imaging. EXAM: NUCLEAR MEDICINE HEPATOBILIARY IMAGING TECHNIQUE: Sequential images of the abdomen were obtained out to 60 minutes following intravenous administration of radiopharmaceutical. RADIOPHARMACEUTICALS:  5.15 mCi Tc-79m  Choletec IV COMPARISON:  CT of the abdomen and pelvis  on 10/12/2017 FINDINGS: Prompt uptake and biliary excretion of activity by the liver is seen. Gallbladder activity is visualized, consistent with patency of cystic duct. At the conclusion of the exam, no definitive bowel activity is identified. Therefore patency of the common bile duct cannot be assessed. Exam was terminated early at the request of the patient. IMPRESSION: 1. No evidence for acute cholecystitis.  Patent cystic duct. 2. Patency of the common bile duct not assessed due to early termination of the exam. Electronically Signed   By: Norva Pavlov M.D.   On: 10/14/2017 13:23   US Abdomen Limited  Result Date: 10/11/2017 CLINICAL DATA:  Right upper quadrant pain EXAM: ULTRASOUND ABDOMEN LIMITED RIGHT UPPER QUADRANT COMPARISON:  None. FINDINGS: Gallbladder: Gallbladder is distended. No gallstones or wall thickening visualized. No pericholecystic fluid. No sonographic Murphy sign noted by sonographer. Common bile duct: Diameter: 5 mm. No intrahepatic or extrahepatic biliary duct dilatation. Liver: No focal lesion identified. Within normal limits in parenchymal echogenicity. Portal vein is patent on color Doppler imaging with normal direction of blood flow towards the liver. There is a right pleural effusion. IMPRESSION: Gallbladder distended but otherwise appears unremarkable. Significance of this finding is uncertain. Given this appearance, it may be prudent to consider nuclear medicine hepatobiliary imaging study to  assess for cystic duct patency. Right pleural effusion. Study otherwise unremarkable. Electronically Signed   By: Bretta Bang III M.D.   On: 10/11/2017 21:15   Dg Chest Port 1 View  Result Date: 10/06/2017 CLINICAL DATA:  MI. EXAM: PORTABLE CHEST 1 VIEW COMPARISON:  None. FINDINGS: Cardiomegaly. Perihilar and lower lobe airspace opacities, likely edema. Layering bilateral effusions. IMPRESSION: Cardiomegaly with mild pulmonary edema and layering effusions. Electronically Signed    By: Charlett Nose M.D.   On: 10/06/2017 10:37   [place] Ekg Site Rite  Result Date: 10/08/2017 If Site Rite image not attached, placement could not be confirmed due to current cardiac rhythm.  Disposition   Pt is being discharged home today in good condition.  Follow-up Plans & Appointments   Follow-up Information    [facility] Follow up on 11/17/2017.   Why:  Dirjase a la direccin anterior 5/16 (clinic to call with time for appointment) para solicitar la tarjeta naranja (la tarjeta proporcionar el copago y la asistencia de medicamentos). Adems, cita con el mdico el 5/23 a las 4 pm en la misma direccin. Contact information: [address] [place] [address] [phone]       Lennette Bihari, MD Follow up on 10/28/2017.   Specialty:  Cardiology Why:  at 11:30 AM with [name], PA Contact information: [address] [place] [address] [phone]          Discharge Instructions    Amb Referral to Cardiac Rehabilitation   Complete by:  As directed    Diagnosis:   STEMI Coronary Stents PTCA       Discharge Medications   Allergies as of 10/24/2017   No Known Allergies     Medication List    STOP taking these medications   metFORMIN 1000 MG tablet Commonly known as:  GLUCOPHAGE   predniSONE 10 MG tablet Commonly known as:  DELTASONE     TAKE these medications   aspirin 81 MG chewable tablet Chew 1 tablet (81 mg total) by mouth daily. Start taking on:  10/25/2017   atorvastatin 80 MG tablet Commonly known as:  LIPITOR Take 1 tablet (80 mg total) by mouth daily.   carvedilol 3.125 MG tablet Commonly known as:  COREG Take 1 tablet (3.125 mg total) by mouth 2 (two) times daily with a meal.   colchicine 0.6 MG tablet Take 1 tablet (0.6 mg total) by mouth daily for 13 days. Start taking on:  10/25/2017   glimepiride 2 MG tablet Commonly known as:  AMARYL Take 2 mg by mouth daily with  breakfast.   isosorbide mononitrate 30 MG 24 hr tablet Commonly known as:  IMDUR Take 0.5 tablets (15 mg total) by mouth daily. Start taking on:  10/25/2017   linagliptin 5 MG Tabs tablet Commonly known as:  TRADJENTA Take 1 tablet (5 mg total) by mouth daily.   loratadine 10 MG tablet Commonly known as:  CLARITIN Take 1 tablet (10 mg total) by mouth daily. Start taking on:  10/25/2017   nitrofurantoin 50 MG capsule Commonly known as:  MACRODANTIN Take 50 mg by mouth 4 (four) times daily.   nitroGLYCERIN 0.4 MG SL tablet Commonly known as:  NITROSTAT Place 1 tablet (0.4 mg total) under the tongue every 5 (five) minutes x 3 doses as needed for chest pain.   pantoprazole 40 MG tablet Commonly known as:  PROTONIX Take 1 tablet (40 mg total) by mouth 2 (two) times daily.  ticagrelor 90 MG Tabs tablet Commonly known as:  BRILINTA Take 1 tablet (90 mg total) by mouth 2 (two) times daily.      Aspirin prescribed at discharge?  Yes High Intensity Statin Prescribed? (Lipitor 40-80mg  or Crestor 20-40mg ): Yes Beta Blocker Prescribed? Yes For EF <40%, was ACEI/ARB Prescribed? No: renal insufficiency  ADP Receptor Inhibitor Prescribed? (i.e. Plavix etc.-Includes Medically Managed Patients): Yes For EF <40%, Aldosterone Inhibitor Prescribed? No, renal insufficiency  Was EF assessed during THIS hospitalization? Yes Was Cardiac Rehab II ordered? (Included Medically managed Patients): Yes   Outstanding Labs/Studies   Pt will need BMET and CBC at follow up visit.   Duration of Discharge Encounter   Greater than 30 minutes including physician time.  Signed, Daron Offer, NP 10/24/2017, 2:44 PM

## 2017-10-25 ENCOUNTER — Telehealth: Payer: Self-pay | Admitting: Cardiovascular Disease

## 2017-10-25 MED ORDER — GLIMEPIRIDE 2 MG PO TABS: 2.0000 mg | ORAL_TABLET | Freq: Every day | ORAL | 1 refills | Status: DC

## 2017-10-25 NOTE — Telephone Encounter (Signed)
Spoke with daughter and pt did not get diabetic med it was not filled Discussed with  Dr Tresa Endo and it is okay to refill Glimepiride as pt is not established at this time with PMD Pt has appt on 11/17/17 with Dr Delrae Alfred .Zack Seal

## 2017-10-25 NOTE — Telephone Encounter (Signed)
ew Message:   Please call,concerning pt's medicine.

## 2017-10-25 NOTE — Telephone Encounter (Signed)
SPOKE TO DAUGHTER , PATIENT PHONE IS DISCONNECTED NOW ,  DAUGHTER ( [name] ) STATES AWARE APPT.  Patient contacted regarding discharge from [facility] on 10/24/17.  Patient understands to follow up with provider BARRETT on 10/28/17 at 11:30 AM at [facility]. Patient understands discharge instructions?  yes  Patient understands medications and regiment?  yes  Patient understands to bring all medications to this visit? yes

## 2017-10-28 ENCOUNTER — Ambulatory Visit (INDEPENDENT_AMBULATORY_CARE_PROVIDER_SITE_OTHER): Payer: Self-pay | Admitting: Physician Assistant

## 2017-10-28 ENCOUNTER — Encounter: Payer: Self-pay | Admitting: Physician Assistant

## 2017-10-28 ENCOUNTER — Ambulatory Visit
Admission: RE | Admit: 2017-10-28 | Discharge: 2017-10-28 | Disposition: A | Discharge status: Home / Self Care | Payer: Self-pay | Source: Ambulatory Visit | Attending: Physician Assistant | Admitting: Physician Assistant

## 2017-10-28 ENCOUNTER — Other Ambulatory Visit: Payer: Self-pay | Admitting: Physician Assistant

## 2017-10-28 VITALS — BP 140/72 | HR 88 | Ht <= 58 in | Wt 114.8 lb

## 2017-10-28 DIAGNOSIS — I10 Essential (primary) hypertension: Secondary | ICD-10-CM

## 2017-10-28 DIAGNOSIS — I213 ST elevation (STEMI) myocardial infarction of unspecified site: Secondary | ICD-10-CM

## 2017-10-28 DIAGNOSIS — E871 Hypo-osmolality and hyponatremia: Secondary | ICD-10-CM

## 2017-10-28 DIAGNOSIS — I255 Ischemic cardiomyopathy: Secondary | ICD-10-CM

## 2017-10-28 DIAGNOSIS — R05 Cough: Secondary | ICD-10-CM

## 2017-10-28 DIAGNOSIS — I241 Dressler's syndrome: Secondary | ICD-10-CM

## 2017-10-28 DIAGNOSIS — R0602 Shortness of breath: Secondary | ICD-10-CM

## 2017-10-28 DIAGNOSIS — I5042 Chronic combined systolic (congestive) and diastolic (congestive) heart failure: Secondary | ICD-10-CM

## 2017-10-28 DIAGNOSIS — R059 Cough, unspecified: Secondary | ICD-10-CM

## 2017-10-28 MED ORDER — BENZONATATE 100 MG PO CAPS
100.0000 mg | ORAL_CAPSULE | Freq: Three times a day (TID) | ORAL | 0 refills | Status: DC | PRN
Start: 2017-10-28 — End: 2017-10-31

## 2017-10-28 MED ORDER — CARVEDILOL 6.25 MG PO TABS: 6.2500 mg | ORAL_TABLET | Freq: Two times a day (BID) | ORAL | 3 refills | Status: DC

## 2017-10-28 MED ORDER — BENZONATATE 100 MG PO CAPS: 100.0000 mg | ORAL_CAPSULE | Freq: Three times a day (TID) | ORAL | 0 refills | Status: DC | PRN

## 2017-10-28 NOTE — Patient Instructions (Signed)
Medication Instructions:  INCREASE- Carvedilol 6.25 mg twice a day  If you need a refill on your cardiac medications before your next appointment, please call your pharmacy.  Labwork: CBC and BMP Today HERE IN OUR OFFICE AT [facility]  Take the provided lab slips with you to the lab for your blood draw.   You will NOT need to fast   Testing/Procedures: A chest x-ray takes a picture of the organs and structures inside the chest, including the heart, lungs, and blood vessels. This test can show several things, including, whether the heart is enlarges; whether fluid is building up in the lungs; and whether pacemaker / defibrillator leads are still in place.   Follow-Up: Your physician wants you to follow-up in: Next available with Dr Tresa Endo.     Thank you for choosing [facility] at [facility]!!

## 2017-10-28 NOTE — Progress Notes (Signed)
Cardiology Office Note   Date:  10/28/2017   ID:  Sheila White, DOB 17-Jun-1941, MRN [id number]  PCP:  Julieanne Manson, MD  Cardiologist: Dr. Tresa Endo in hospital 10/21/2017 Sheila Demark, PA-C    Chief Complaint  Patient presents with  . Follow-up    had heartattack on April 10th, denies current chest pains, swelling in hands/feet, SOB, pt does complain of coughing episodes    History of Present Illness: Sheila White is a 77 y.o. female with a history of HOH, NIDDM  Admitted 4/10- 10/24/2017 for STEMI, late presentation, acute S-CHF, AKI, hyponatremia, abd pain, anemia, weakness.  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  Weight at discharge 111 pounds the Lasix stopped due to hyponatremia with a sodium of 128 at discharge (116 on admission).  Peak creatinine 3.86, Nephrology felt contrast-induced injury, creatinine 1.88 at discharge.  Possible PUD, Carafate stopped at discharge, on PPI.  Hemoglobin 9.3 at discharge.  Metformin stopped and patient on Amaryl and Tradjenta at discharge, further management per PCP  Sheila White presents for cardiology follow up.  Her daughter is here with her to translate and also helps in her care.  She is doing daily weights, 133.8>114.4 after breakfast.    She has had a small amount of swelling in her legs at the end of the day, does not wake with it.   She is coughing during the day and at night. The sx were improved in the hospital by [facility], but she has started coughing again, it is a dry cough. Robitussin does not help much.   Does not think the cough is from allergies, the Claritin helps the nasal congestion she gets from allergies.  The abdominal pain and the chest pain are all gone.   She is having regular bowel movements.  No GI issues.  No obvious bleeding.  She and her daughter are aware of the appointment at the [facility] clinic and will keep it.   Past Medical  History:  Diagnosis Date  . Acute combined systolic and diastolic CHF, NYHA class 3 (HCC) 10/10/2017   in setting of MI  . HOH (hard of hearing)   . Ischemic cardiomyopathy 10/05/2017  . Non-insulin dependent type 2 diabetes mellitus (HCC)   . 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 STEMI involving left anterior descending coronary artery (HCC) 10/05/2017    Past Surgical History:  Procedure Laterality Date  . CORONARY/GRAFT ACUTE MI REVASCULARIZATION N/A 10/05/2017   Procedure: Coronary/Graft Acute MI Revascularization;  Surgeon: Lennette Bihari, MD;  Location: [facility] INVASIVE CV LAB;  Service: Cardiovascular;  Laterality: N/A;  . LEFT HEART CATH AND CORONARY ANGIOGRAPHY N/A 10/05/2017   Procedure: LEFT HEART CATH AND CORONARY ANGIOGRAPHY;  Surgeon: Lennette Bihari, MD;  Location: [facility] INVASIVE CV LAB;  Service: Cardiovascular;  Laterality: N/A;    Current Outpatient Medications  Medication Sig Dispense Refill  . aspirin 81 MG chewable tablet Chew 1 tablet (81 mg total) by mouth daily. 60 tablet 2  . atorvastatin (LIPITOR) 80 MG tablet Take 1 tablet (80 mg total) by mouth daily. 60 tablet 3  . carvedilol (COREG) 3.125 MG tablet Take 1 tablet (3.125 mg total) by mouth 2 (two) times daily with a meal. 180 tablet 3  . colchicine 0.6 MG tablet Take 1 tablet (0.6 mg total) by mouth daily for 13 days. 15 tablet 0  . glimepiride (AMARYL) 2 MG tablet Take 1 tablet (2 mg total) by  mouth daily with breakfast. 30 tablet 1  . isosorbide mononitrate (IMDUR) 30 MG 24 hr tablet Take 0.5 tablets (15 mg total) by mouth daily. 60 tablet 3  . loratadine (CLARITIN) 10 MG tablet Take 1 tablet (10 mg total) by mouth daily. 60 tablet 2  . nitroGLYCERIN (NITROSTAT) 0.4 MG SL tablet Place 1 tablet (0.4 mg total) under the tongue every 5 (five) minutes x 3 doses as needed for chest pain. 25 tablet 0  . pantoprazole (PROTONIX) 40 MG tablet Take 1 tablet (40 mg total) by mouth 2 (two) times daily. 60 tablet 3  .  ticagrelor (BRILINTA) 90 MG TABS tablet Take 1 tablet (90 mg total) by mouth 2 (two) times daily. 120 tablet 4  . linagliptin (TRADJENTA) 5 MG TABS tablet Take 1 tablet (5 mg total) by mouth daily. (Patient not taking: Reported on 10/28/2017) 30 tablet 3  . nitrofurantoin (MACRODANTIN) 50 MG capsule Take 50 mg by mouth 4 (four) times daily.     No current facility-administered medications for this visit.     Allergies:   Patient has no known allergies.    Social History:  The patient  reports that she has never smoked. She has never used smokeless tobacco. She reports that she drank alcohol. She reports that she does not use drugs.   Family History:  The patient's family history is not on file.     ROS:  Please see the history of present illness. All other systems are reviewed and negative.    PHYSICAL EXAM: VS:  BP 140/72 (BP Location: Left Arm)   Pulse 88   Ht  (1.473 m)   Wt 114 lb 12.8 oz (52.1 kg)   BMI 23.99 kg/m  , BMI Body mass index is 23.99 kg/m. GEN: Well nourished, well developed, female in no acute distress  HEENT: normal for age  Neck: Minimal JVD, no carotid bruit, no masses Cardiac: RRR; soft murmur, no rubs, or gallops Respiratory: Rales bases bilaterally, normal work of breathing GI: soft, nontender, nondistended, + BS MS: no deformity or atrophy; no edema; distal pulses are 2+ in all 4 extremities   Skin: warm and dry, no rash Neuro:  Strength and sensation are intact Psych: euthymic mood, full affect    EKG:  EKG is not ordered today.  Cath: 10/05/17 Conclusion    Mid LAD lesion is 90% stenosed.  Prox LAD lesion is 99% stenosed.  Dist LAD lesion is 99% stenosed.  1st Mrg lesion is 95% stenosed.  Ost 2nd Mrg to 2nd Mrg lesion is 30% stenosed.  Ost RPDA to RPDA lesion is 30% stenosed.  Post intervention, there is a 0% residual stenosis.  Post intervention, there is a 0% residual stenosis.  Post intervention, there is a 35% residual  stenosis.  A stent was successfully placed.  A stent was successfully placed. Acute late presentation anterior ST segment elevation myocardial infarction with demonstration of subtotal long proximal LAD stenosis, 90% mid stenosis, and 99% apical stenosis with reduced apical flow. Left circumflex disease with 50% proximal OM1 stenosis followed by distal 95% marginal stenosis prior to giving off distal branches in the OM1 vessel.  Mild diffuse irregularity of a large dominant RCA with 30% narrowing in the PDA vessel. LVEDP 34 mmHg/ Successful PCI to the LAD with ultimate insertion of a 2.526 mm Resolute DES stent postdilated to 2.8 mm proximally and 2.68 mm distally with the 99% stenosis being reduced to 0%; DES stenting of the mid 90%  stenosis with ultimate insertion of a 2.2512 mm Resolute DES stent with residual narrowing at 0%, and initial 99% apical thrombotic stenosis with reduced flow treated with low-level PTCA and intracoronary verapamil with improvement to approximately 35-40% in a very small apical LAD segment. RECOMMENDATION: DAPT for minimum of 1 year. Diuresis with elevated EDP. An echo Doppler study will be obtained in a.m. Patient should be started on low-dose ACE inhibition, carvedilol, probable nitrate therapy, and high potency statin treatment.   TTE: 10/06/17 Study Conclusions - Left ventricle: The cavity size was normal. There was mild focal basal hypertrophy of the septum. Mid to apical inferoseptal/anteroseptal akinesis, apical lateral akinesis, mid to apical anterior akinesis, apical inferior akinesis, akinesis of the true apex. The estimated ejection fraction was 30%. Features are consistent with a pseudonormal left ventricular filling pattern, with concomitant abnormal relaxation and increased filling pressure (grade 2 diastolic dysfunction). - Aortic valve: Trileaflet; moderately calcified leaflets. Sclerosis without stenosis. There was  trivial regurgitation. - Mitral valve: Mildly calcified annulus. There was trivial regurgitation. - Right ventricle: The cavity size was normal. Systolic function was normal. - Pulmonary arteries: No complete TR doppler jet so unable to estimate PA systolic pressure. - Inferior vena cava: The vessel was normal in size. The respirophasic diameter changes were in the normal range (= 50%), consistent with normal central venous pressure. - Pericardium, extracardiac: A trivial pericardial effusion was identified. Impressions: - Normal LV size with mild focal basal septal hypertrophy. EF 30% with wall motion abnormalities as noted above in LAD distribution. Normal RV size and systolic function. Aortic valve sclerosis without significant stenosis.   Recent Labs: 10/05/2017: TSH 3.377 10/14/2017: ALT 17 10/21/2017: B Natriuretic Peptide 868.5 10/23/2017: BUN 24; Creatinine, Ser 1.88; Hemoglobin 9.3; Platelets 300; Potassium 4.0; Sodium 128    Lipid Panel    Component Value Date/Time   CHOL 161 10/06/2017 0256   TRIG 110 10/06/2017 0256   HDL 49 10/06/2017 0256   CHOLHDL 3.3 10/06/2017 0256   VLDL 22 10/06/2017 0256   LDLCALC 90 10/06/2017 0256     Wt Readings from Last 3 Encounters:  10/28/17 114 lb 12.8 oz (52.1 kg)  10/24/17 111 lb 6.4 oz (50.5 kg)     Other studies Reviewed: Additional studies/ records that were reviewed today include: Hospital records and testing.  ASSESSMENT AND PLAN:  1.  CAD with recent MI and DES LAD: Continue baby aspirin, Brilinta, beta-blocker and high-dose statin.  She is also on Imdur.  No ischemic symptoms.  2.  Hypertension: Her blood pressure is above target today, increase the carvedilol.  I explained to the daughter that it was okay to give her 2 of her old pills at a time to use them up, then 1 pill twice a day out of the new bottle.  3.  Post MI pericarditis: The symptoms have resolved, she is to complete the  colchicine.  4.  Chronic combined systolic and diastolic CHF: Daughter understands the importance of daily weights and will make sure they happen.  She is not currently on a diuretic, and has no overload by exam.  They are to call us if her weight goes up or down by more than 3 pounds.  5.  Hyponatremia, AKI: Recheck a BMET today  6.  Anemia: No obvious blood loss on dual antiplatelet therapy, recheck CBC today  7.  Cough: She has some rales in her bases and the cough is gotten worse since discharge.  Give her a short-term prescription for  Tessalon Perles, follow-up with internal medicine and check a chest x-ray.  8.  Ischemic cardiomyopathy: She is on beta-blocker which will be increased and a low dose of Imdur.  Review today's labs with Dr. Tresa Endo and decide on starting low-dose Entresto.   Current medicines are reviewed at length with the patient today.  The patient has concerns regarding medicines.  Concerns were addressed  The following changes have been made: Increase Coreg  Labs/ tests ordered today include:   Orders Placed This Encounter  Procedures  . DG Chest 2 View  . CBC  . Basic Metabolic Panel (BMET)     Disposition:   FU with Dr. Tresa Endo  Signed, Sheila Demark, PA-C  10/28/2017 12:03 PM    [facility] Phone: [phone]; Fax: [phone]  This note was written with the assistance of speech recognition software. Please excuse any transcriptional errors.

## 2017-10-29 ENCOUNTER — Other Ambulatory Visit: Payer: Self-pay

## 2017-10-29 ENCOUNTER — Encounter (HOSPITAL_COMMUNITY): Payer: Self-pay | Admitting: *Deleted

## 2017-10-29 ENCOUNTER — Emergency Department (HOSPITAL_COMMUNITY)
Admission: EM | Admit: 2017-10-29 | Discharge: 2017-10-30 | Disposition: A | Discharge status: Home / Self Care | Payer: Self-pay | Attending: Emergency Medicine | Admitting: Emergency Medicine

## 2017-10-29 DIAGNOSIS — I509 Heart failure, unspecified: Secondary | ICD-10-CM | POA: Insufficient documentation

## 2017-10-29 LAB — CBC
HEMATOCRIT: 29.7 % — AB (ref 34.0–46.6)
HEMOGLOBIN: 9.5 g/dL — AB (ref 11.1–15.9)
MCH: 26.9 pg (ref 26.6–33.0)
MCHC: 32 g/dL (ref 31.5–35.7)
MCV: 84 fL (ref 79–97)
Platelets: 366 10*3/uL (ref 150–379)
RBC: 3.53 x10E6/uL — AB (ref 3.77–5.28)
RDW: 14.6 % (ref 12.3–15.4)
WBC: 6.7 10*3/uL (ref 3.4–10.8)

## 2017-10-29 LAB — BASIC METABOLIC PANEL
BUN/Creatinine Ratio: 16 (ref 12–28)
BUN: 24 mg/dL (ref 8–27)
CALCIUM: 8.9 mg/dL (ref 8.7–10.3)
CO2: 19 mmol/L — ABNORMAL LOW (ref 20–29)
CREATININE: 1.47 mg/dL — AB (ref 0.57–1.00)
Chloride: 99 mmol/L (ref 96–106)
GFR calc Af Amer: 39 mL/min/{1.73_m2} — ABNORMAL LOW (ref >59)
GFR, EST NON AFRICAN AMERICAN: 34 mL/min/{1.73_m2} — AB (ref >59)
Glucose: 183 mg/dL — ABNORMAL HIGH (ref 65–99)
POTASSIUM: 5.3 mmol/L — AB (ref 3.5–5.2)
Sodium: 134 mmol/L (ref 134–144)

## 2017-10-29 NOTE — ED Triage Notes (Signed)
The pt was just discharged from this hospital  This past Monday after having a heart attack she has been coughing all week and was given  tesselone  Pills that are not help;iong. She saw her doctor yesterday who gave her tessalone  She has not stopped coughing

## 2017-10-30 ENCOUNTER — Emergency Department (HOSPITAL_COMMUNITY): Payer: Self-pay

## 2017-10-30 DIAGNOSIS — I25119 Atherosclerotic heart disease of native coronary artery with unspecified angina pectoris: Secondary | ICD-10-CM

## 2017-10-30 LAB — BASIC METABOLIC PANEL
Anion gap: 11 (ref 5–15)
BUN: 24 mg/dL — AB (ref 6–20)
CO2: 20 mmol/L — ABNORMAL LOW (ref 22–32)
Calcium: 8.5 mg/dL — ABNORMAL LOW (ref 8.9–10.3)
Chloride: 98 mmol/L — ABNORMAL LOW (ref 101–111)
Creatinine, Ser: 1.52 mg/dL — ABNORMAL HIGH (ref 0.44–1.00)
GFR calc Af Amer: 36 mL/min — ABNORMAL LOW (ref >60)
GFR calc non Af Amer: 31 mL/min — ABNORMAL LOW (ref >60)
GLUCOSE: 143 mg/dL — AB (ref 65–99)
POTASSIUM: 4.5 mmol/L (ref 3.5–5.1)
Sodium: 129 mmol/L — ABNORMAL LOW (ref 135–145)

## 2017-10-30 LAB — CBC WITH DIFFERENTIAL/PLATELET
Basophils Absolute: 0.1 10*3/uL (ref 0.0–0.1)
Basophils Relative: 1 %
EOS PCT: 6 %
Eosinophils Absolute: 0.4 10*3/uL (ref 0.0–0.7)
HCT: 26.5 % — ABNORMAL LOW (ref 36.0–46.0)
HEMOGLOBIN: 8.5 g/dL — AB (ref 12.0–15.0)
LYMPHS ABS: 2.1 10*3/uL (ref 0.7–4.0)
LYMPHS PCT: 33 %
MCH: 26.2 pg (ref 26.0–34.0)
MCHC: 32.1 g/dL (ref 30.0–36.0)
MCV: 81.8 fL (ref 78.0–100.0)
Monocytes Absolute: 0.3 10*3/uL (ref 0.1–1.0)
Monocytes Relative: 5 %
NEUTROS PCT: 55 %
Neutro Abs: 3.4 10*3/uL (ref 1.7–7.7)
Platelets: 361 10*3/uL (ref 150–400)
RBC: 3.24 MIL/uL — AB (ref 3.87–5.11)
RDW: 13.7 % (ref 11.5–15.5)
WBC: 6.1 10*3/uL (ref 4.0–10.5)

## 2017-10-30 LAB — I-STAT TROPONIN, ED: Troponin i, poc: 0.04 ng/mL (ref 0.00–0.08)

## 2017-10-30 LAB — BRAIN NATRIURETIC PEPTIDE: B Natriuretic Peptide: 1990.1 pg/mL — ABNORMAL HIGH (ref 0.0–100.0)

## 2017-10-30 MED ORDER — FUROSEMIDE 20 MG PO TABS: 20.0000 mg | ORAL_TABLET | Freq: Every day | ORAL | 0 refills | Status: DC

## 2017-10-30 MED ORDER — FUROSEMIDE 10 MG/ML IJ SOLN
80.0000 mg | Freq: Once | INTRAMUSCULAR | Status: AC
  Administered 2017-10-30: 80 mg via INTRAVENOUS
  Filled 2017-10-30: qty 8

## 2017-10-30 MED ORDER — HYDROCODONE-ACETAMINOPHEN 7.5-325 MG/15ML PO SOLN
10.0000 mL | Freq: Once | ORAL | Status: AC
  Administered 2017-10-30: 10 mL via ORAL
  Filled 2017-10-30: qty 15

## 2017-10-30 MED ORDER — HYDROCOD POLST-CPM POLST ER 10-8 MG/5ML PO SUER: 5.0000 mL | Freq: Every evening | ORAL | 0 refills | Status: DC | PRN

## 2017-10-30 MED ORDER — POTASSIUM CHLORIDE CRYS ER 20 MEQ PO TBCR
20.0000 meq | EXTENDED_RELEASE_TABLET | Freq: Two times a day (BID) | ORAL | Status: DC
  Filled 2017-10-30: qty 1

## 2017-10-30 MED ORDER — FUROSEMIDE 20 MG PO TABS
40.0000 mg | ORAL_TABLET | Freq: Once | ORAL | Status: AC
Start: 2017-10-30 — End: 2017-10-30
  Administered 2017-10-30: 40 mg via ORAL
  Filled 2017-10-30: qty 2

## 2017-10-30 MED ORDER — POTASSIUM CHLORIDE ER 10 MEQ PO TBCR
10.0000 meq | EXTENDED_RELEASE_TABLET | Freq: Two times a day (BID) | ORAL | 0 refills | Status: DC
Start: 2017-10-30 — End: 2017-11-17

## 2017-10-30 NOTE — ED Notes (Signed)
Patient transported to X-ray 

## 2017-10-30 NOTE — ED Provider Notes (Addendum)
[facility] EMERGENCY DEPARTMENT Provider Note   CSN: [id number] Arrival date & time: 10/29/17  2334     History   Chief Complaint Chief Complaint  Patient presents with  . Cough    HPI Sheila White is a 77 y.o. female.  Complaint is cough, and shortness of breath.  HPI: 77 year old female.  Discharged 6 days ago after 19-day hospitalization for STEMI, new onset CHF, pericarditis, and cough.  Patient presented for 10 after several days of discomfort and was found to have anterior MI with ST elevation and Q waves.  Underwent stenting with 3 LAD, and one marginal stent.  EF during hospitalization was 30.  Discharge diagnosis ischemic cardiomyopathy, CHF, pericarditis, and cough.  Discharged on Imdur and carvedilol.  Not on Lasix.   Had cough while she was hospitalized.  Was seen in the office this week and given Tessalon.  Has not helped her cough.  Continues to complain of cough during the day and at night.  NO additional chest pain.  No fever.  No purulent sputum.  Past Medical History:  Diagnosis Date  . Acute combined systolic and diastolic CHF, NYHA class 3 (HCC) 10/10/2017   in setting of MI  . HOH (hard of hearing)   . Ischemic cardiomyopathy 10/05/2017  . Non-insulin dependent type 2 diabetes mellitus (HCC)   . 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 STEMI involving left anterior descending coronary artery (HCC) 10/05/2017    Patient Active Problem List   Diagnosis Date Noted  . Cough   . Hyponatremia   . Acute systolic HF (heart failure) (HCC) 10/20/2017  . Abdominal pain   . MI, acute, non ST segment elevation (HCC)   . Ischemic cardiomyopathy   . STEMI (ST elevation myocardial infarction) (HCC) 10/05/2017  . Dyspnea 10/05/2017  . HOH (hard of hearing) 10/05/2017  . STEMI involving left anterior descending coronary artery (HCC) 10/05/2017    Past Surgical History:  Procedure Laterality Date  . CORONARY/GRAFT ACUTE MI  REVASCULARIZATION N/A 10/05/2017   Procedure: Coronary/Graft Acute MI Revascularization;  Surgeon: Lennette Bihari, MD;  Location: [facility] INVASIVE CV LAB;  Service: Cardiovascular;  Laterality: N/A;  . LEFT HEART CATH AND CORONARY ANGIOGRAPHY N/A 10/05/2017   Procedure: LEFT HEART CATH AND CORONARY ANGIOGRAPHY;  Surgeon: Lennette Bihari, MD;  Location: [facility] INVASIVE CV LAB;  Service: Cardiovascular;  Laterality: N/A;     OB History   None      Home Medications    Prior to Admission medications   Medication Sig Start Date End Date Taking? Authorizing Provider  aspirin 81 MG chewable tablet Chew 1 tablet (81 mg total) by mouth daily. 10/25/17  Yes Georgie Chard D, NP  atorvastatin (LIPITOR) 80 MG tablet Take 1 tablet (80 mg total) by mouth daily. 10/24/17  Yes Georgie Chard D, NP  benzonatate (TESSALON PERLES) 100 MG capsule Take 1 capsule (100 mg total) by mouth 3 (three) times daily as needed for cough. 10/28/17  Yes Barrett, Joline Salt, PA-C  carvedilol (COREG) 6.25 MG tablet Take 1 tablet (6.25 mg total) by mouth 2 (two) times daily with a meal. 10/28/17  Yes Barrett, Rhonda G, PA-C  colchicine 0.6 MG tablet Take 1 tablet (0.6 mg total) by mouth daily for 13 days. 10/25/17 11/07/17 Yes Georgie Chard D, NP  glimepiride (AMARYL) 2 MG tablet Take 1 tablet (2 mg total) by mouth daily with breakfast. 10/25/17  Yes Lennette Bihari, MD  isosorbide mononitrate (IMDUR) 30 MG 24  hr tablet Take 0.5 tablets (15 mg total) by mouth daily. 10/25/17  Yes Georgie Chard D, NP  linagliptin (TRADJENTA) 5 MG TABS tablet Take 1 tablet (5 mg total) by mouth daily. 10/24/17  Yes Georgie Chard D, NP  loratadine (CLARITIN) 10 MG tablet Take 1 tablet (10 mg total) by mouth daily. 10/25/17  Yes Georgie Chard D, NP  nitroGLYCERIN (NITROSTAT) 0.4 MG SL tablet Place 1 tablet (0.4 mg total) under the tongue every 5 (five) minutes x 3 doses as needed for chest pain. 10/24/17  Yes Georgie Chard D, NP  pantoprazole (PROTONIX) 40 MG tablet  Take 1 tablet (40 mg total) by mouth 2 (two) times daily. 10/24/17  Yes Georgie Chard D, NP  ticagrelor (BRILINTA) 90 MG TABS tablet Take 1 tablet (90 mg total) by mouth 2 (two) times daily. 10/24/17  Yes Filbert Schilder, NP    Family History No family history on file.  Social History Social History   Tobacco Use  . Smoking status: Never Smoker  . Smokeless tobacco: Never Used  Substance Use Topics  . Alcohol use: Not Currently  . Drug use: Never     Allergies   Patient has no known allergies.   Review of Systems Review of Systems  Constitutional: Negative for appetite change, chills, diaphoresis, fatigue and fever.  HENT: Negative for mouth sores, sore throat and trouble swallowing.   Eyes: Negative for visual disturbance.  Respiratory: Positive for cough and shortness of breath. Negative for chest tightness and wheezing.   Cardiovascular: Negative for chest pain.  Gastrointestinal: Negative for abdominal distention, abdominal pain, diarrhea, nausea and vomiting.  Endocrine: Negative for polydipsia, polyphagia and polyuria.  Genitourinary: Negative for dysuria, frequency and hematuria.  Musculoskeletal: Negative for gait problem.  Skin: Negative for color change, pallor and rash.  Neurological: Negative for dizziness, syncope, light-headedness and headaches.  Hematological: Does not bruise/bleed easily.  Psychiatric/Behavioral: Negative for behavioral problems and confusion.     Physical Exam Updated Vital Signs BP 120/71   Pulse 88   Temp 98.6 F (37 C) (Oral)   Resp 18   SpO2 97%   Physical Exam  Constitutional: She is oriented to person, place, and time. She appears well-developed and well-nourished. No distress.  HENT:  Head: Normocephalic.  Eyes: Pupils are equal, round, and reactive to light. Conjunctivae are normal. No scleral icterus.  Neck: Normal range of motion. Neck supple. No thyromegaly present.  Cardiovascular: Normal rate and regular rhythm.  Exam reveals no gallop and no friction rub.  No murmur heard. Pulmonary/Chest: Effort normal and breath sounds normal. No respiratory distress. She has no wheezes. She has no rales.  Frequent cough.  Scattered rhonchi.  No frank rales are globally diminished breath sounds  Abdominal: Soft. Bowel sounds are normal. She exhibits no distension. There is no tenderness. There is no rebound.  Musculoskeletal: Normal range of motion.  Neurological: She is alert and oriented to person, place, and time.  Skin: Skin is warm and dry. No rash noted.  Psychiatric: She has a normal mood and affect. Her behavior is normal.     ED Treatments / Results  Labs (all labs ordered are listed, but only abnormal results are displayed) Labs Reviewed  CBC WITH DIFFERENTIAL/PLATELET - Abnormal; Notable for the following components:      Result Value   RBC 3.24 (*)    Hemoglobin 8.5 (*)    HCT 26.5 (*)    All other components within normal limits  BASIC METABOLIC  PANEL - Abnormal; Notable for the following components:   Sodium 129 (*)    Chloride 98 (*)    CO2 20 (*)    Glucose, Bld 143 (*)    BUN 24 (*)    Creatinine, Ser 1.52 (*)    Calcium 8.5 (*)    GFR calc non Af Amer 31 (*)    GFR calc Af Amer 36 (*)    All other components within normal limits  BRAIN NATRIURETIC PEPTIDE  I-STAT TROPONIN, ED    EKG None  Radiology Dg Chest 2 View  Result Date: 10/30/2017 CLINICAL DATA:  Shortness of breath EXAM: CHEST - 2 VIEW COMPARISON:  10/28/2017, 10/18/2017 FINDINGS: Small bilateral pleural effusions. Cardiomegaly with vascular congestion and moderate pulmonary edema. Bibasilar consolidations. Aortic atherosclerosis. No pneumothorax. IMPRESSION: 1. Cardiomegaly with vascular congestion and mild to moderate pulmonary edema. 2. Small pleural effusions with bibasilar atelectasis or pneumonia. Electronically Signed   By: Jasmine Pang M.D.   On: 10/30/2017 03:50   Dg Chest 2 View  Result Date:  10/29/2017 CLINICAL DATA:  77 year old female with shortness of breath and cough for 1 week. EXAM: CHEST - 2 VIEW COMPARISON:  Chest x-ray 10/18/2017. FINDINGS: Patchy multifocal interstitial and airspace disease noted throughout the mid to lower lungs bilaterally. Small bilateral pleural effusions. No evidence of pulmonary edema. Heart size is normal. Upper mediastinal contours are within normal limits. Aortic atherosclerosis. IMPRESSION: 1. Findings are concerning for multilobar bronchopneumonia throughout the lung bases with associated small bilateral parapneumonic pleural effusions. 2. Aortic atherosclerosis. Electronically Signed   By: Trudie Reed M.D.   On: 10/29/2017 09:43    Procedures Procedures (including critical care time)  Medications Ordered in ED Medications  HYDROcodone-acetaminophen (HYCET) 7.5-325 mg/15 ml solution 10 mL (10 mLs Oral Given 10/30/17 0239)  furosemide (LASIX) injection 80 mg (80 mg Intravenous Given 10/30/17 0425)     Initial Impression / Assessment and Plan / ED Course  I have reviewed the triage vital signs and the nursing notes.  Pertinent labs & imaging results that were available during my care of the patient were reviewed by me and considered in my medical decision making (see chart for details).   77 year old recent MI and new onset CHF.  Main complaint is cough.  Does not have marked stigmata of CHF clinically.  However radiographically has cephalization and pulmonary vascular congestion.  Will diurese.  Await labs and EKG.  If reassuring and improving, I will discuss with cardiology.  May be candidate for discharge with Lasix as she was not discharged from the hospital with it.     EKG: slor R progression, no acute changes. Sinus rhythm  Final Clinical Impressions(s) / ED Diagnoses   Final diagnoses:  Congestive heart failure, unspecified HF chronicity, unspecified heart failure type [facility])    ED Discharge Orders    None       Rolland Porter,  MD 10/30/17 [id number]    Rolland Porter, MD 11/15/17 1550

## 2017-10-30 NOTE — ED Notes (Signed)
Nurse will start IV and draw labs. 

## 2017-10-30 NOTE — Discharge Instructions (Addendum)
Follow-up with your cardiologist this week to recheck lab values. Return to ER with new or worsening symptoms

## 2017-10-31 ENCOUNTER — Emergency Department (HOSPITAL_COMMUNITY): Payer: Medicaid Other

## 2017-10-31 ENCOUNTER — Encounter (HOSPITAL_COMMUNITY): Payer: Self-pay

## 2017-10-31 ENCOUNTER — Telehealth: Payer: Self-pay | Admitting: Cardiovascular Disease

## 2017-10-31 ENCOUNTER — Inpatient Hospital Stay (HOSPITAL_COMMUNITY)
Admission: EM | Admit: 2017-10-31 | Discharge: 2017-11-03 | DRG: 281 | Disposition: A | Discharge status: Home Health | Payer: Medicaid Other | Attending: Internal Medicine | Admitting: Internal Medicine

## 2017-10-31 ENCOUNTER — Other Ambulatory Visit: Payer: Self-pay

## 2017-10-31 DIAGNOSIS — Z7902 Long term (current) use of antithrombotics/antiplatelets: Secondary | ICD-10-CM

## 2017-10-31 DIAGNOSIS — Z7982 Long term (current) use of aspirin: Secondary | ICD-10-CM

## 2017-10-31 DIAGNOSIS — D649 Anemia, unspecified: Secondary | ICD-10-CM | POA: Diagnosis present

## 2017-10-31 DIAGNOSIS — N189 Chronic kidney disease, unspecified: Secondary | ICD-10-CM

## 2017-10-31 DIAGNOSIS — N3000 Acute cystitis without hematuria: Secondary | ICD-10-CM | POA: Insufficient documentation

## 2017-10-31 DIAGNOSIS — E1151 Type 2 diabetes mellitus with diabetic peripheral angiopathy without gangrene: Secondary | ICD-10-CM | POA: Diagnosis present

## 2017-10-31 DIAGNOSIS — R079 Chest pain, unspecified: Secondary | ICD-10-CM | POA: Diagnosis present

## 2017-10-31 DIAGNOSIS — Z7984 Long term (current) use of oral hypoglycemic drugs: Secondary | ICD-10-CM

## 2017-10-31 DIAGNOSIS — I241 Dressler's syndrome: Secondary | ICD-10-CM | POA: Diagnosis present

## 2017-10-31 DIAGNOSIS — I252 Old myocardial infarction: Secondary | ICD-10-CM

## 2017-10-31 DIAGNOSIS — Z955 Presence of coronary angioplasty implant and graft: Secondary | ICD-10-CM

## 2017-10-31 DIAGNOSIS — B961 Klebsiella pneumoniae [K. pneumoniae] as the cause of diseases classified elsewhere: Secondary | ICD-10-CM | POA: Diagnosis present

## 2017-10-31 DIAGNOSIS — I2510 Atherosclerotic heart disease of native coronary artery without angina pectoris: Secondary | ICD-10-CM | POA: Diagnosis present

## 2017-10-31 DIAGNOSIS — H9190 Unspecified hearing loss, unspecified ear: Secondary | ICD-10-CM | POA: Diagnosis present

## 2017-10-31 DIAGNOSIS — I2102 ST elevation (STEMI) myocardial infarction involving left anterior descending coronary artery: Secondary | ICD-10-CM | POA: Diagnosis present

## 2017-10-31 DIAGNOSIS — I5043 Acute on chronic combined systolic (congestive) and diastolic (congestive) heart failure: Principal | ICD-10-CM | POA: Diagnosis present

## 2017-10-31 DIAGNOSIS — I5023 Acute on chronic systolic (congestive) heart failure: Secondary | ICD-10-CM | POA: Diagnosis not present

## 2017-10-31 DIAGNOSIS — R109 Unspecified abdominal pain: Secondary | ICD-10-CM

## 2017-10-31 DIAGNOSIS — R1013 Epigastric pain: Secondary | ICD-10-CM

## 2017-10-31 DIAGNOSIS — I25119 Atherosclerotic heart disease of native coronary artery with unspecified angina pectoris: Secondary | ICD-10-CM | POA: Diagnosis not present

## 2017-10-31 DIAGNOSIS — I255 Ischemic cardiomyopathy: Secondary | ICD-10-CM | POA: Diagnosis present

## 2017-10-31 DIAGNOSIS — E222 Syndrome of inappropriate secretion of antidiuretic hormone: Secondary | ICD-10-CM | POA: Diagnosis present

## 2017-10-31 DIAGNOSIS — Z8711 Personal history of peptic ulcer disease: Secondary | ICD-10-CM

## 2017-10-31 DIAGNOSIS — N289 Disorder of kidney and ureter, unspecified: Secondary | ICD-10-CM

## 2017-10-31 DIAGNOSIS — N390 Urinary tract infection, site not specified: Secondary | ICD-10-CM | POA: Diagnosis present

## 2017-10-31 DIAGNOSIS — Z9861 Coronary angioplasty status: Secondary | ICD-10-CM | POA: Diagnosis present

## 2017-10-31 DIAGNOSIS — I5021 Acute systolic (congestive) heart failure: Secondary | ICD-10-CM

## 2017-10-31 DIAGNOSIS — E871 Hypo-osmolality and hyponatremia: Secondary | ICD-10-CM | POA: Diagnosis present

## 2017-10-31 DIAGNOSIS — R627 Adult failure to thrive: Secondary | ICD-10-CM | POA: Diagnosis present

## 2017-10-31 DIAGNOSIS — G9340 Encephalopathy, unspecified: Secondary | ICD-10-CM

## 2017-10-31 HISTORY — DX: Atherosclerotic heart disease of native coronary artery without angina pectoris: I25.10

## 2017-10-31 HISTORY — DX: Chest pain, unspecified: R07.9

## 2017-10-31 LAB — I-STAT VENOUS BLOOD GAS, ED
Acid-base deficit: 4 mmol/L — ABNORMAL HIGH (ref 0.0–2.0)
Bicarbonate: 20.4 mmol/L (ref 20.0–28.0)
O2 Saturation: 64 %
PCO2 VEN: 33.9 mmHg — AB (ref 44.0–60.0)
PH VEN: 7.386 (ref 7.250–7.430)
PO2 VEN: 33 mmHg (ref 32.0–45.0)
TCO2: 21 mmol/L — ABNORMAL LOW (ref 22–32)

## 2017-10-31 LAB — I-STAT CHEM 8, ED
BUN: 25 mg/dL — AB (ref 6–20)
CHLORIDE: 95 mmol/L — AB (ref 101–111)
CREATININE: 1.6 mg/dL — AB (ref 0.44–1.00)
Calcium, Ion: 1.08 mmol/L — ABNORMAL LOW (ref 1.15–1.40)
GLUCOSE: 222 mg/dL — AB (ref 65–99)
HCT: 26 % — ABNORMAL LOW (ref 36.0–46.0)
Hemoglobin: 8.8 g/dL — ABNORMAL LOW (ref 12.0–15.0)
POTASSIUM: 4.1 mmol/L (ref 3.5–5.1)
Sodium: 128 mmol/L — ABNORMAL LOW (ref 135–145)
TCO2: 21 mmol/L — ABNORMAL LOW (ref 22–32)

## 2017-10-31 LAB — URINALYSIS, COMPLETE (UACMP) WITH MICROSCOPIC
Bilirubin Urine: NEGATIVE
GLUCOSE, UA: NEGATIVE mg/dL
KETONES UR: NEGATIVE mg/dL
Nitrite: NEGATIVE
PH: 6 (ref 5.0–8.0)
Protein, ur: NEGATIVE mg/dL
Specific Gravity, Urine: 1.005 (ref 1.005–1.030)
WBC, UA: >50 WBC/hpf — ABNORMAL HIGH (ref 0–5)

## 2017-10-31 LAB — COMPREHENSIVE METABOLIC PANEL
ALBUMIN: 2.8 g/dL — AB (ref 3.5–5.0)
ALT: 15 U/L (ref 14–54)
ANION GAP: 10 (ref 5–15)
AST: 20 U/L (ref 15–41)
Alkaline Phosphatase: 73 U/L (ref 38–126)
BILIRUBIN TOTAL: 0.4 mg/dL (ref 0.3–1.2)
BUN: 27 mg/dL — ABNORMAL HIGH (ref 6–20)
CO2: 22 mmol/L (ref 22–32)
Calcium: 8.2 mg/dL — ABNORMAL LOW (ref 8.9–10.3)
Chloride: 96 mmol/L — ABNORMAL LOW (ref 101–111)
Creatinine, Ser: 1.74 mg/dL — ABNORMAL HIGH (ref 0.44–1.00)
GFR calc Af Amer: 31 mL/min — ABNORMAL LOW (ref >60)
GFR calc non Af Amer: 27 mL/min — ABNORMAL LOW (ref >60)
GLUCOSE: 225 mg/dL — AB (ref 65–99)
POTASSIUM: 4.1 mmol/L (ref 3.5–5.1)
Sodium: 128 mmol/L — ABNORMAL LOW (ref 135–145)
TOTAL PROTEIN: 6 g/dL — AB (ref 6.5–8.1)

## 2017-10-31 LAB — CBC WITH DIFFERENTIAL/PLATELET
BASOS ABS: 0 10*3/uL (ref 0.0–0.1)
Basophils Relative: 1 %
Eosinophils Absolute: 0.3 10*3/uL (ref 0.0–0.7)
Eosinophils Relative: 7 %
HEMATOCRIT: 26 % — AB (ref 36.0–46.0)
HEMOGLOBIN: 8.5 g/dL — AB (ref 12.0–15.0)
LYMPHS PCT: 34 %
Lymphs Abs: 1.6 10*3/uL (ref 0.7–4.0)
MCH: 26.4 pg (ref 26.0–34.0)
MCHC: 32.7 g/dL (ref 30.0–36.0)
MCV: 80.7 fL (ref 78.0–100.0)
MONO ABS: 0.3 10*3/uL (ref 0.1–1.0)
Monocytes Relative: 6 %
NEUTROS ABS: 2.5 10*3/uL (ref 1.7–7.7)
NEUTROS PCT: 52 %
Platelets: 385 10*3/uL (ref 150–400)
RBC: 3.22 MIL/uL — AB (ref 3.87–5.11)
RDW: 13.6 % (ref 11.5–15.5)
WBC: 4.8 10*3/uL (ref 4.0–10.5)

## 2017-10-31 LAB — TROPONIN I: TROPONIN I: 0.05 ng/mL — AB (ref <0.03)

## 2017-10-31 LAB — I-STAT CG4 LACTIC ACID, ED: Lactic Acid, Venous: 0.8 mmol/L (ref 0.5–1.9)

## 2017-10-31 LAB — I-STAT TROPONIN, ED: TROPONIN I, POC: 0.03 ng/mL (ref 0.00–0.08)

## 2017-10-31 LAB — RAPID URINE DRUG SCREEN, HOSP PERFORMED
AMPHETAMINES: NOT DETECTED
BENZODIAZEPINES: POSITIVE — AB
Barbiturates: NOT DETECTED
Cocaine: NOT DETECTED
OPIATES: NOT DETECTED
Tetrahydrocannabinol: NOT DETECTED

## 2017-10-31 LAB — CBG MONITORING, ED
Glucose-Capillary: 206 mg/dL — ABNORMAL HIGH (ref 65–99)
Glucose-Capillary: 120 mg/dL — ABNORMAL HIGH (ref 65–99)

## 2017-10-31 LAB — LIPASE, BLOOD: LIPASE: 51 U/L (ref 11–51)

## 2017-10-31 LAB — ETHANOL: Alcohol, Ethyl (B): <10 mg/dL (ref <10)

## 2017-10-31 LAB — AMMONIA: AMMONIA: 12 umol/L (ref 9–35)

## 2017-10-31 LAB — BRAIN NATRIURETIC PEPTIDE: B Natriuretic Peptide: 1648 pg/mL — ABNORMAL HIGH (ref 0.0–100.0)

## 2017-10-31 MED ORDER — SODIUM CHLORIDE 0.9 % IV SOLN: INTRAVENOUS | Status: DC

## 2017-10-31 MED ORDER — TICAGRELOR 90 MG PO TABS
90.0000 mg | ORAL_TABLET | Freq: Two times a day (BID) | ORAL | Status: DC
  Administered 2017-10-31 – 2017-11-03 (×6): 90 mg via ORAL
  Filled 2017-10-31 (×6): qty 1

## 2017-10-31 MED ORDER — NITROGLYCERIN 2 % TD OINT
1.0000 [in_us] | TOPICAL_OINTMENT | Freq: Once | TRANSDERMAL | Status: AC
  Administered 2017-10-31: 1 [in_us] via TOPICAL
  Filled 2017-10-31: qty 1

## 2017-10-31 MED ORDER — NITROGLYCERIN 0.4 MG SL SUBL: 0.4000 mg | SUBLINGUAL_TABLET | SUBLINGUAL | Status: DC | PRN

## 2017-10-31 MED ORDER — SODIUM CHLORIDE 0.9 % IV SOLN
1.0000 g | INTRAVENOUS | Status: DC
  Administered 2017-11-01 – 2017-11-03 (×3): 1 g via INTRAVENOUS
  Filled 2017-10-31 (×4): qty 1

## 2017-10-31 MED ORDER — POTASSIUM CHLORIDE CRYS ER 10 MEQ PO TBCR
10.0000 meq | EXTENDED_RELEASE_TABLET | Freq: Two times a day (BID) | ORAL | Status: DC
  Administered 2017-11-01 – 2017-11-03 (×5): 10 meq via ORAL
  Filled 2017-10-31 (×8): qty 1

## 2017-10-31 MED ORDER — INSULIN ASPART 100 UNIT/ML ~~LOC~~ SOLN: 0.0000 [IU] | Freq: Every day | SUBCUTANEOUS | Status: DC

## 2017-10-31 MED ORDER — ONDANSETRON HCL 4 MG/2ML IJ SOLN: 4.0000 mg | Freq: Four times a day (QID) | INTRAMUSCULAR | Status: DC | PRN

## 2017-10-31 MED ORDER — ATORVASTATIN CALCIUM 80 MG PO TABS
80.0000 mg | ORAL_TABLET | Freq: Every day | ORAL | Status: DC
  Administered 2017-11-01 – 2017-11-02 (×2): 80 mg via ORAL
  Filled 2017-10-31 (×2): qty 1

## 2017-10-31 MED ORDER — INSULIN ASPART 100 UNIT/ML ~~LOC~~ SOLN
0.0000 [IU] | Freq: Three times a day (TID) | SUBCUTANEOUS | Status: DC
Start: 2017-11-01 — End: 2017-11-03
  Administered 2017-11-01 – 2017-11-02 (×3): 2 [IU] via SUBCUTANEOUS
  Administered 2017-11-02: 1 [IU] via SUBCUTANEOUS

## 2017-10-31 MED ORDER — GUAIFENESIN-DM 100-10 MG/5ML PO SYRP
5.0000 mL | ORAL_SOLUTION | ORAL | Status: DC | PRN
  Administered 2017-11-01 – 2017-11-02 (×3): 5 mL via ORAL
  Filled 2017-10-31 (×3): qty 5

## 2017-10-31 MED ORDER — PANTOPRAZOLE SODIUM 40 MG PO TBEC
40.0000 mg | DELAYED_RELEASE_TABLET | Freq: Two times a day (BID) | ORAL | Status: DC
  Administered 2017-10-31 – 2017-11-03 (×6): 40 mg via ORAL
  Filled 2017-10-31 (×6): qty 1

## 2017-10-31 MED ORDER — ACETAMINOPHEN 325 MG PO TABS
650.0000 mg | ORAL_TABLET | ORAL | Status: DC | PRN
  Administered 2017-11-01 (×2): 650 mg via ORAL
  Filled 2017-10-31 (×2): qty 2

## 2017-10-31 MED ORDER — CARVEDILOL 3.125 MG PO TABS
3.1250 mg | ORAL_TABLET | Freq: Two times a day (BID) | ORAL | Status: DC
  Administered 2017-11-01 – 2017-11-03 (×5): 3.125 mg via ORAL
  Filled 2017-10-31 (×6): qty 1

## 2017-10-31 MED ORDER — FUROSEMIDE 10 MG/ML IJ SOLN
20.0000 mg | Freq: Two times a day (BID) | INTRAMUSCULAR | Status: DC
  Administered 2017-11-01 – 2017-11-03 (×5): 20 mg via INTRAVENOUS
  Filled 2017-10-31 (×5): qty 2

## 2017-10-31 MED ORDER — FUROSEMIDE 10 MG/ML IJ SOLN
20.0000 mg | Freq: Once | INTRAMUSCULAR | Status: AC
  Administered 2017-10-31: 20 mg via INTRAVENOUS
  Filled 2017-10-31: qty 2

## 2017-10-31 MED ORDER — ASPIRIN 81 MG PO CHEW
81.0000 mg | CHEWABLE_TABLET | Freq: Every day | ORAL | Status: DC
  Administered 2017-11-01 – 2017-11-03 (×3): 81 mg via ORAL
  Filled 2017-10-31 (×3): qty 1

## 2017-10-31 MED ORDER — SODIUM CHLORIDE 0.9 % IV SOLN
1.0000 g | Freq: Once | INTRAVENOUS | Status: AC
  Administered 2017-10-31: 1 g via INTRAVENOUS
  Filled 2017-10-31: qty 10

## 2017-10-31 MED ORDER — ISOSORBIDE MONONITRATE ER 30 MG PO TB24
15.0000 mg | ORAL_TABLET | Freq: Every day | ORAL | Status: DC
  Administered 2017-11-01 – 2017-11-03 (×3): 15 mg via ORAL
  Filled 2017-10-31 (×3): qty 1

## 2017-10-31 MED ORDER — HEPARIN SODIUM (PORCINE) 5000 UNIT/ML IJ SOLN
5000.0000 [IU] | Freq: Three times a day (TID) | INTRAMUSCULAR | Status: DC
  Administered 2017-10-31 – 2017-11-03 (×7): 5000 [IU] via SUBCUTANEOUS
  Filled 2017-10-31 (×7): qty 1

## 2017-10-31 NOTE — ED Notes (Signed)
Hospitalist paged to Dr. Eudelia Bunch per his request

## 2017-10-31 NOTE — Telephone Encounter (Signed)
Pt c/o BP issue: STAT if pt c/o blurred vision, one-sided weakness or slurred speech  1. What are your last 5 BP readings? 100/58  2. Are you having any other symptoms (ex. Dizziness, headache, blurred vision, passed out)? Dizziness   3. What is your BP issue? Low and chest discomfort, pt keeps grabbing at her stomach

## 2017-10-31 NOTE — Telephone Encounter (Signed)
Returned call and left message for [facility] RN, [employer].   In review pt in ED being evaluated

## 2017-10-31 NOTE — ED Provider Notes (Signed)
The patient was accepted at change of shift, she has had a recent ST elevation MI which unfortunately was a late presentation and left her with congestive heart failure despite stenting of her obstructive disease.  Since that time the patient has had some intermittent chest discomfort, she was in fact seen here couple of days ago but presents today with chest pain, some altered mental status, shortness of breath per family.  The patient on laboratory work-up does not fact have a urinary tract infection.  Troponin is negative, the EKG shows an evolving MI with Q waves, will discuss with cardiology and admit to the hospitalist.  Rocephin given for urinary tract infection.  Discussed with cardiology, they will consult as needed if hospitalist pages them tomorrow.  They agree with admission for antibiotics and ongoing evaluation of chest pain  D/w Dr. 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

## 2017-10-31 NOTE — H&P (Signed)
History and Physical    Sheila White ZOX:[id number] DOB: 07/12/1940 DOA: 10/31/2017  PCP: Julieanne Manson, MD   Patient coming from: Home  Chief Complaint: Abdominal pain, SOB, cough, fatigue  HPI: Sheila White is a 77 y.o. female with medical history significant for type 2 diabetes mellitus, coronary artery disease,  STEMI last month treated with DES to LAD, and systolic CHF, now presenting to the emergency department for evaluation of abdominal pain, shortness of breath, nonproductive cough, and fatigue.  Patient was discharged from the hospital on 10/24/2017 after management of STEMI.  She developed systolic CHF with EF 30% but diuresis was complicated by hyponatremia and acute kidney injury.  Since returning home, she has had progressive dyspnea with orthopnea, nonproductive cough, and fatigue.  She denies fevers or chills.  She has also been experiencing abdominal pain with mild nausea but no vomiting or diarrhea.  She had recently undergone extensive inpatient work-up for the abdominal pain which was negative, and she was started on twice daily Protonix for suspected PUD.  EMS was called for transport to the hospital, there was concern that the abdominal pain may be anginal and she was treated with aspirin and nitroglycerin prior to arrival in the ED.  ED Course: Upon arrival to the ED, patient is found to be afebrile, saturating well on room air, and with vitals otherwise stable.  EKG features a sinus rhythm with inferior ST elevations.  Chest x-ray suggest CHF with mild interstitial edema.  Noncontrast head CT is negative for acute intracranial abnormality.  Chemistry panel features a sodium of 128, similar to priors, and a creatinine 1.74, up from 1.52 yesterday.  CBC is notable for normocytic anemia with hemoglobin 8.5.  Lactic acid is normal, troponin is normal, and BNP is elevated at 1648.  Review of Systems:  All other systems reviewed and apart from HPI, are negative.  Past Medical  History:  Diagnosis Date  . Acute combined systolic and diastolic CHF, NYHA class 3 (HCC) 10/10/2017   in setting of MI  . HOH (hard of hearing)   . Ischemic cardiomyopathy 10/05/2017  . Non-insulin dependent type 2 diabetes mellitus (HCC)   . 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 STEMI involving left anterior descending coronary artery (HCC) 10/05/2017    Past Surgical History:  Procedure Laterality Date  . CORONARY/GRAFT ACUTE MI REVASCULARIZATION N/A 10/05/2017   Procedure: Coronary/Graft Acute MI Revascularization;  Surgeon: Lennette Bihari, MD;  Location[facility] INVASIVE CV LAB;  Service: Cardiovascular;  Laterality: N/A;  . LEFT HEART CATH AND CORONARY ANGIOGRAPHY N/A 10/05/2017   Procedure: LEFT HEART CATH AND CORONARY ANGIOGRAPHY;  Surgeon: Lennette Bihari, MD;  Location: [facility] INVASIVE CV LAB;  Service: Cardiovascular;  Laterality: N/A;     reports that she has never smoked. She has never used smokeless tobacco. She reports that she drank alcohol. She reports that she does not use drugs.  No Known Allergies  History reviewed. No pertinent family history.   Prior to Admission medications   Medication Sig Start Date End Date Taking? Authorizing Provider  aspirin 81 MG chewable tablet Chew 1 tablet (81 mg total) by mouth daily. 10/25/17  Yes Georgie Chard D, NP  atorvastatin (LIPITOR) 80 MG tablet Take 1 tablet (80 mg total) by mouth daily. 10/24/17  Yes Georgie Chard D, NP  carvedilol (COREG) 3.125 MG tablet Take 3.125 mg by mouth 2 (two) times daily with a meal.   Yes [provider]  colchicine 0.6 MG tablet Take 1  tablet (0.6 mg total) by mouth daily for 13 days. 10/25/17 11/07/17 Yes Georgie Chard D, NP  furosemide (LASIX) 20 MG tablet Take 1 tablet (20 mg total) by mouth daily. 10/30/17  Yes Rolland Porter, MD  glimepiride (AMARYL) 2 MG tablet Take 1 tablet (2 mg total) by mouth daily with breakfast. 10/25/17  Yes Lennette Bihari, MD  isosorbide mononitrate (IMDUR)  30 MG 24 hr tablet Take 0.5 tablets (15 mg total) by mouth daily. 10/25/17  Yes Georgie Chard D, NP  loratadine (CLARITIN) 10 MG tablet Take 1 tablet (10 mg total) by mouth daily. 10/25/17  Yes Georgie Chard D, NP  nitroGLYCERIN (NITROSTAT) 0.4 MG SL tablet Place 1 tablet (0.4 mg total) under the tongue every 5 (five) minutes x 3 doses as needed for chest pain. 10/24/17  Yes Georgie Chard D, NP  pantoprazole (PROTONIX) 40 MG tablet Take 1 tablet (40 mg total) by mouth 2 (two) times daily. 10/24/17  Yes Georgie Chard D, NP  potassium chloride (K-DUR) 10 MEQ tablet Take 1 tablet (10 mEq total) by mouth 2 (two) times daily. 10/30/17  Yes Rolland Porter, MD  ticagrelor (BRILINTA) 90 MG TABS tablet Take 1 tablet (90 mg total) by mouth 2 (two) times daily. 10/24/17  Yes Georgie Chard D, NP  carvedilol (COREG) 6.25 MG tablet Take 1 tablet (6.25 mg total) by mouth 2 (two) times daily with a meal. 10/28/17   Barrett, Joline Salt, PA-C  chlorpheniramine-HYDROcodone (TUSSIONEX PENNKINETIC ER) 10-8 MG/5ML SUER Take 5 mLs by mouth at bedtime as needed for cough. 10/30/17   Rolland Porter, MD  linagliptin (TRADJENTA) 5 MG TABS tablet Take 1 tablet (5 mg total) by mouth daily. 10/24/17   Filbert Schilder, NP    Physical Exam: Vitals:   10/31/17 1730 10/31/17 1745 10/31/17 1815 10/31/17 2116  BP: 129/74 113/87 115/71 102/73  Pulse: 86 79 84 84  Resp: (!) Temp:      TempSrc:      SpO2: 100% 100% 99% 99%  Weight:      Height:          Constitutional: NAD, calm  Eyes: PERTLA, lids and conjunctivae normal ENMT: Mucous membranes are moist. Posterior pharynx clear of any exudate or lesions.   Neck: normal, supple, no masses, no thyromegaly Respiratory: Fine rales bilaterally. No accessory muscle use.  Cardiovascular: S1 & S2 heard, regular rate and rhythm. JVP 9 cmH2O. Abdomen: No distension, no tenderness, soft. Bowel sounds normal.  Musculoskeletal: no clubbing / cyanosis. No joint deformity upper and  lower extremities.   Skin: no significant rashes, lesions, ulcers. Warm, dry, well-perfused. Neurologic: CN 2-12 grossly intact. Sensation intact. Strength 5/5 in all 4 limbs.  Psychiatric:  Alert and oriented to person, place, and situation. Normal mood and affect.     Labs on Admission: I have personally reviewed following labs and imaging studies  CBC: Recent Labs  Lab 10/28/17 1215 10/30/17 0415 10/31/17 1429 10/31/17 1458  WBC 6.7 6.1 4.8  --   NEUTROABS  --  3.4 2.5  --   HGB 9.5* 8.5* 8.5* 8.8*  HCT 29.7* 26.5* 26.0* 26.0*  MCV 84 81.8 80.7  --   PLT 366 361 385  --    Basic Metabolic Panel: Recent Labs  Lab 10/28/17 1215 10/30/17 0415 10/31/17 1429 10/31/17 1458  NA 134 129* 128* 128*  K 5.3* 4.5 4.1 4.1  CL 99 98* 96* 95*  CO2 19* 20* 22  --  GLUCOSE 183* 143* 225* 222*  BUN 24 24* 27* 25*  CREATININE 1.47* 1.52* 1.74* 1.60*  CALCIUM 8.9 8.5* 8.2*  --    GFR: Estimated Creatinine Clearance: 20.3 mL/min (A) (by C-G formula based on SCr of 1.6 mg/dL (H)). Liver Function Tests: Recent Labs  Lab 10/31/17 1429  AST 20  ALT 15  ALKPHOS 73  BILITOT 0.4  PROT 6.0*  ALBUMIN 2.8*   No results for input(s): LIPASE, AMYLASE in the last 168 hours. Recent Labs  Lab 10/31/17 1430  AMMONIA 12   Coagulation Profile: No results for input(s): INR, PROTIME in the last 168 hours. Cardiac Enzymes: No results for input(s): CKTOTAL, CKMB, CKMBINDEX, TROPONINI in the last 168 hours. BNP (last 3 results) No results for input(s): PROBNP in the last 8760 hours. HbA1C: No results for input(s): HGBA1C in the last 72 hours. CBG: Recent Labs  Lab 10/31/17 1450  GLUCAP 206*   Lipid Profile: No results for input(s): CHOL, HDL, LDLCALC, TRIG, CHOLHDL, LDLDIRECT in the last 72 hours. Thyroid Function Tests: No results for input(s): TSH, T4TOTAL, FREET4, T3FREE, THYROIDAB in the last 72 hours. Anemia Panel: No results for input(s): VITAMINB12, FOLATE, FERRITIN, TIBC,  IRON, RETICCTPCT in the last 72 hours. Urine analysis:    Component Value Date/Time   COLORURINE YELLOW 10/31/2017 1920   APPEARANCEUR HAZY (A) 10/31/2017 1920   LABSPEC 1.005 10/31/2017 1920   PHURINE 6.0 10/31/2017 1920   GLUCOSEU NEGATIVE 10/31/2017 1920   HGBUR MODERATE (A) 10/31/2017 1920   BILIRUBINUR NEGATIVE 10/31/2017 1920   KETONESUR NEGATIVE 10/31/2017 1920   PROTEINUR NEGATIVE 10/31/2017 1920   NITRITE NEGATIVE 10/31/2017 1920   LEUKOCYTESUR LARGE (A) 10/31/2017 1920   Sepsis Labs: (procalcitonin:4,lacticidven:4) )No results found for this or any previous visit (from the past 240 hour(s)).   Radiological Exams on Admission: Dg Chest 2 View  Result Date: 10/31/2017 CLINICAL DATA:  Epigastric pain radiating into the chest since yesterday. Recent MI with coronary stent placement. CHF. Nonsmoker. EXAM: CHEST - 2 VIEW COMPARISON:  PA and lateral chest x-ray of Oct 30, 2017 FINDINGS: The lungs are well-expanded. The interstitial markings remain increased. Patchy densities are present at both lung bases. Small amounts of pleural fluid are noted on the right. The cardiac silhouette is mildly enlarged. There is calcification in the wall of the aortic arch. The trachea is midline. The bony thorax exhibits no acute abnormality. IMPRESSION: CHF with mild interstitial edema. Bibasilar atelectasis. Small right and trace left pleural effusions. Thoracic aortic atherosclerosis. Electronically Signed   By: David  [place] M.D.   On: 10/31/2017 16:21   Dg Chest 2 View  Result Date: 10/30/2017 CLINICAL DATA:  Shortness of breath EXAM: CHEST - 2 VIEW COMPARISON:  10/28/2017, 10/18/2017 FINDINGS: Small bilateral pleural effusions. Cardiomegaly with vascular congestion and moderate pulmonary edema. Bibasilar consolidations. Aortic atherosclerosis. No pneumothorax. IMPRESSION: 1. Cardiomegaly with vascular congestion and mild to moderate pulmonary edema. 2. Small pleural effusions with bibasilar  atelectasis or pneumonia. Electronically Signed   By: Jasmine Pang M.D.   On: 10/30/2017 03:50   Ct Head Wo Contrast  Result Date: 10/31/2017 CLINICAL DATA:  77 year old presenting with acute onset of occipital headache, nausea and vomiting, and acute mental status changes. EXAM: CT HEAD WITHOUT CONTRAST TECHNIQUE: Contiguous axial images were obtained from the base of the skull through the vertex without intravenous contrast. COMPARISON:  None. FINDINGS: Brain: Moderate cortical, deep and cerebellar atrophy. Remote strokes involving both cerebellar hemispheres. Mild changes of small vessel disease of  the white matter diffusely. No mass lesion. No midline shift. No acute hemorrhage or hematoma. No extra-axial fluid collections. No evidence of acute infarction. Vascular: Moderate BILATERAL carotid siphon and vertebral artery atherosclerosis. No hyperdense vessel. Skull: No skull fracture or other focal osseous abnormality involving the skull. Sinuses/Orbits: Visualized paranasal sinuses, bilateral mastoid air cells and bilateral middle ear cavities well-aerated. Bony nasal septal deviation to the LEFT. Visualized orbits and globes normal in appearance. Other: None. IMPRESSION: 1. No acute intracranial abnormality. 2. Remote strokes involving both cerebellar hemispheres. 3. Moderate generalized atrophy and mild chronic microvascular ischemic changes of the white matter. Electronically Signed   By: Hulan Saas M.D.   On: 10/31/2017 16:20    EKG: Independently reviewed. Sinus rhythm.   Assessment/Plan  1. Acute systolic CHF  - Presents with abdominal pain, mild confusion, and SOB with orthopnea and non-productive cough  - Rales on exam, interstitial edema on CXR, marked BNP elevation  - Diuresis during recent admission was complicated by renal insufficiency and hyponatremia  - Given Lasix 20 mg IV in ED   - Continue diuresis with Lasix 20 mg IV q12h, follow I/O's, daily wts, daily chem panel,  continue beta-blocker   2. 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 Renal insufficiency  - SCr is 1.74 on admission, up from 1.52 the day before, but down from 3.86 last month  - She is being diuresed as above and chem panel will be repeated in am   4. CAD - Presents with abdominal pain, reports this to be different than angina she had last month  - ST abnormalities on admission EKG reviewed by cardiology and secondary to recent MI  - Initial troponin is wnl  - Continue DAPT, continue beta-blocker, statin, nitrates; ACE/ARB precluded by renal insufficiency    5. UTI  - Presents with abdominal pain and mild confusion  - UA suggestive of infection and sample sent for culture  - Continue empiric abx while awaiting culture data    6. Type II DM  - A1c was 6.6% last month  - Managed at home with glimepiride and Tradjenta, held on admission  - Follow CBG's and use a SSI with Novolog while in hospital    7. Anemia  - Hgb is 8.5 on admission  - No bleeding evident, slightly lower than priors in setting of hypervolemia  - Check anemia panel     DVT prophylaxis: sq heparin  Code Status: Full  Family Communication: Family updated at bedside Consults called: Cardiology Admission status: Observation    Briscoe Deutscher, MD [facility] Pager [phone]  If 7PM-7AM, please contact night-coverage [email or web address] Password [facility]  10/31/2017, 9:24 PM

## 2017-10-31 NOTE — ED Provider Notes (Addendum)
[facility] EMERGENCY DEPARTMENT Provider Note  CSN: [id number] Arrival date & time: 10/31/17 1426  Chief Complaint(s) Chest Pain and Abdominal Pain  HPI Danija Gosa is a 77 y.o. female with a recent STEMI requiring stenting to the LAD, ischemic cardiomyopathy with last EF of 35 to 40% during admission several weeks ago presents to the emergency department with upper abdominal and chest pain.  Family and EMS reported that the patient was complaining of upper abdominal pain that resolved yesterday however this morning her pain returned with associated shortness of breath and diaphoresis.  EKG by EMS was concerning for possible ST segment elevation in the anterior septal lead consistent with her previous STEMI.  Code STEMI was  initiated in route by EMS.   Of note patient was evaluated 2 days ago for 2 days of dry cough and diagnosed with CHF exacerbation.  As she was not started on Lasix during admission, patient was started on Lasix in the emergency department and cardiology was consulted.  She was discharged home on Lasix.  Initially patient appeared to be altered as she answered I do not know to most of the questions however when family arrived the patient was reported at her baseline and answering patients with more detail and she seemed to be oriented.  They did report that she was a little bit off her baseline.   Today patient complains of epigastric and substernal chest pressure that is nonradiating but exertional with associated shortness of breath and diaphoresis.  No other alleviating or aggravating factors.  She denies any headache recent fevers, vomiting or diarrhea.  She denies any urinary symptoms other than frequency.   HPI  Past Medical History Past Medical History:  Diagnosis Date  . Acute combined systolic and diastolic CHF, NYHA class 3 (HCC) 10/10/2017   in setting of MI  . HOH (hard of hearing)   . Ischemic cardiomyopathy 10/05/2017  . Non-insulin  dependent type 2 diabetes mellitus (HCC)   . 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 STEMI involving left anterior descending coronary artery (HCC) 10/05/2017   Patient Active Problem List   Diagnosis Date Noted  . Cough   . Hyponatremia   . Acute systolic HF (heart failure) (HCC) 10/20/2017  . Abdominal pain   . MI, acute, non ST segment elevation (HCC)   . Ischemic cardiomyopathy   . STEMI (ST elevation myocardial infarction) (HCC) 10/05/2017  . Dyspnea 10/05/2017  . HOH (hard of hearing) 10/05/2017  . STEMI involving left anterior descending coronary artery (HCC) 10/05/2017   Home Medication(s) Prior to Admission medications   Medication Sig Start Date End Date Taking? Authorizing Provider  aspirin 81 MG chewable tablet Chew 1 tablet (81 mg total) by mouth daily. 10/25/17  Yes Georgie Chard D, NP  atorvastatin (LIPITOR) 80 MG tablet Take 1 tablet (80 mg total) by mouth daily. 10/24/17  Yes Georgie Chard D, NP  carvedilol (COREG) 3.125 MG tablet Take 3.125 mg by mouth 2 (two) times daily with a meal.   Yes [provider]  colchicine 0.6 MG tablet Take 1 tablet (0.6 mg total) by mouth daily for 13 days. 10/25/17 11/07/17 Yes Georgie Chard D, NP  furosemide (LASIX) 20 MG tablet Take 1 tablet (20 mg total) by mouth daily. 10/30/17  Yes Rolland Porter, MD  glimepiride (AMARYL) 2 MG tablet Take 1 tablet (2 mg total) by mouth daily with breakfast. 10/25/17  Yes Lennette Bihari, MD  isosorbide mononitrate (IMDUR) 30 MG 24 hr tablet  Take 0.5 tablets (15 mg total) by mouth daily. 10/25/17  Yes Georgie Chard D, NP  loratadine (CLARITIN) 10 MG tablet Take 1 tablet (10 mg total) by mouth daily. 10/25/17  Yes Georgie Chard D, NP  nitroGLYCERIN (NITROSTAT) 0.4 MG SL tablet Place 1 tablet (0.4 mg total) under the tongue every 5 (five) minutes x 3 doses as needed for chest pain. 10/24/17  Yes Georgie Chard D, NP  pantoprazole (PROTONIX) 40 MG tablet Take 1 tablet (40 mg total) by mouth 2  (two) times daily. 10/24/17  Yes Georgie Chard D, NP  potassium chloride (K-DUR) 10 MEQ tablet Take 1 tablet (10 mEq total) by mouth 2 (two) times daily. 10/30/17  Yes Rolland Porter, MD  ticagrelor (BRILINTA) 90 MG TABS tablet Take 1 tablet (90 mg total) by mouth 2 (two) times daily. 10/24/17  Yes Georgie Chard D, NP  benzonatate (TESSALON PERLES) 100 MG capsule Take 1 capsule (100 mg total) by mouth 3 (three) times daily as needed for cough. Patient not taking: Reported on 10/31/2017 10/28/17   Barrett, Joline Salt, PA-C  carvedilol (COREG) 6.25 MG tablet Take 1 tablet (6.25 mg total) by mouth 2 (two) times daily with a meal. 10/28/17   Barrett, Joline Salt, PA-C  chlorpheniramine-HYDROcodone (TUSSIONEX PENNKINETIC ER) 10-8 MG/5ML SUER Take 5 mLs by mouth at bedtime as needed for cough. 10/30/17   Rolland Porter, MD  linagliptin (TRADJENTA) 5 MG TABS tablet Take 1 tablet (5 mg total) by mouth daily. 10/24/17   Filbert Schilder, NP                                                                                                                                    Past Surgical History Past Surgical History:  Procedure Laterality Date  . CORONARY/GRAFT ACUTE MI REVASCULARIZATION N/A 10/05/2017   Procedure: Coronary/Graft Acute MI Revascularization;  Surgeon: Lennette Bihari, MD;  Location: [facility] INVASIVE CV LAB;  Service: Cardiovascular;  Laterality: N/A;  . LEFT HEART CATH AND CORONARY ANGIOGRAPHY N/A 10/05/2017   Procedure: LEFT HEART CATH AND CORONARY ANGIOGRAPHY;  Surgeon: Lennette Bihari, MD;  Location: [facility] INVASIVE CV LAB;  Service: Cardiovascular;  Laterality: N/A;   Family History History reviewed. No pertinent family history.  Social History Social History   Tobacco Use  . Smoking status: Never Smoker  . Smokeless tobacco: Never Used  Substance Use Topics  . Alcohol use: Not Currently  . Drug use: Never   Allergies Patient has no known allergies.  Review of Systems Review of Systems All other systems are  reviewed and are negative for acute change except as noted in the HPI  Physical Exam Vital Signs  I have reviewed the triage vital signs BP (!) 110/59   Pulse 76   Temp (!) 96.9 F (36.1 C) (Rectal)   Resp 19   Ht  (1.473 m)   Wt 51.7 kg (114 lb)  SpO2 99%   BMI 23.83 kg/m   Physical Exam  Constitutional: She is oriented to person, place, and time. She appears well-developed and well-nourished. No distress.  HENT:  Head: Normocephalic and atraumatic.  Nose: Nose normal.  Eyes: Pupils are equal, round, and reactive to light. Conjunctivae and EOM are normal. Right eye exhibits no discharge. Left eye exhibits no discharge. No scleral icterus.  Neck: Normal range of motion. Neck supple.  Cardiovascular: Normal rate and regular rhythm. Exam reveals no gallop and no friction rub.  No murmur heard. Pulmonary/Chest: Effort normal. No stridor. No respiratory distress. She has rales in the right lower field and the left lower field.  Abdominal: Soft. She exhibits no distension. There is no tenderness.  Musculoskeletal: She exhibits no edema or tenderness.  No peripheral edema.  Neurological: She is alert and oriented to person, place, and time.  Skin: Skin is warm and dry. No rash noted. She is not diaphoretic. No erythema.  Psychiatric: She has a normal mood and affect.  Vitals reviewed.   ED Results and Treatments Labs (all labs ordered are listed, but only abnormal results are displayed) Labs Reviewed  COMPREHENSIVE METABOLIC PANEL - Abnormal; Notable for the following components:      Result Value   Sodium 128 (*)    Chloride 96 (*)    Glucose, Bld 225 (*)    BUN 27 (*)    Creatinine, Ser 1.74 (*)    Calcium 8.2 (*)    Total Protein 6.0 (*)    Albumin 2.8 (*)    GFR calc non Af Amer 27 (*)    GFR calc Af Amer 31 (*)    All other components within normal limits  CBC WITH DIFFERENTIAL/PLATELET - Abnormal; Notable for the following components:   RBC 3.22 (*)     Hemoglobin 8.5 (*)    HCT 26.0 (*)    All other components within normal limits  BRAIN NATRIURETIC PEPTIDE - Abnormal; Notable for the following components:   B Natriuretic Peptide 1,648.0 (*)    All other components within normal limits  CBG MONITORING, ED - Abnormal; Notable for the following components:   Glucose-Capillary 206 (*)    All other components within normal limits  I-STAT CHEM 8, ED - Abnormal; Notable for the following components:   Sodium 128 (*)    Chloride 95 (*)    BUN 25 (*)    Creatinine, Ser 1.60 (*)    Glucose, Bld 222 (*)    Calcium, Ion 1.08 (*)    TCO2 21 (*)    Hemoglobin 8.8 (*)    HCT 26.0 (*)    All other components within normal limits  I-STAT VENOUS BLOOD GAS, ED - Abnormal; Notable for the following components:   pCO2, Ven 33.9 (*)    TCO2 21 (*)    Acid-base deficit 4.0 (*)    All other components within normal limits  AMMONIA  ETHANOL  URINALYSIS, COMPLETE (UACMP) WITH MICROSCOPIC  RAPID URINE DRUG SCREEN, HOSP PERFORMED  LIPASE, BLOOD  I-STAT CG4 LACTIC ACID, ED  I-STAT TROPONIN, ED  EKG  EKG Interpretation  Date/Time:  Monday Oct 31 2017 14:27:29 EDT Ventricular Rate:  73 PR Interval:    QRS Duration: 91 QT Interval:  389 QTC Calculation: 429 R Axis:   -72 Text Interpretation:  Sinus rhythm Probable left atrial enlargement Consider inferior infarct Anterolateral infarct, age indeterminate ST elevation, consider inferior injury No significant change since last tracing Confirmed by Drema Pry [phone]) on 10/31/2017 3:22:36 PM      Radiology Dg Chest 2 View  Result Date: 10/31/2017 CLINICAL DATA:  Epigastric pain radiating into the chest since yesterday. Recent MI with coronary stent placement. CHF. Nonsmoker. EXAM: CHEST - 2 VIEW COMPARISON:  PA and lateral chest x-ray of Oct 30, 2017 FINDINGS: The lungs are  well-expanded. The interstitial markings remain increased. Patchy densities are present at both lung bases. Small amounts of pleural fluid are noted on the right. The cardiac silhouette is mildly enlarged. There is calcification in the wall of the aortic arch. The trachea is midline. The bony thorax exhibits no acute abnormality. IMPRESSION: CHF with mild interstitial edema. Bibasilar atelectasis. Small right and trace left pleural effusions. Thoracic aortic atherosclerosis. Electronically Signed   By: David  [place] M.D.   On: 10/31/2017 16:21   Ct Head Wo Contrast  Result Date: 10/31/2017 CLINICAL DATA:  77 year old presenting with acute onset of occipital headache, nausea and vomiting, and acute mental status changes. EXAM: CT HEAD WITHOUT CONTRAST TECHNIQUE: Contiguous axial images were obtained from the base of the skull through the vertex without intravenous contrast. COMPARISON:  None. FINDINGS: Brain: Moderate cortical, deep and cerebellar atrophy. Remote strokes involving both cerebellar hemispheres. Mild changes of small vessel disease of the white matter diffusely. No mass lesion. No midline shift. No acute hemorrhage or hematoma. No extra-axial fluid collections. No evidence of acute infarction. Vascular: Moderate BILATERAL carotid siphon and vertebral artery atherosclerosis. No hyperdense vessel. Skull: No skull fracture or other focal osseous abnormality involving the skull. Sinuses/Orbits: Visualized paranasal sinuses, bilateral mastoid air cells and bilateral middle ear cavities well-aerated. Bony nasal septal deviation to the LEFT. Visualized orbits and globes normal in appearance. Other: None. IMPRESSION: 1. No acute intracranial abnormality. 2. Remote strokes involving both cerebellar hemispheres. 3. Moderate generalized atrophy and mild chronic microvascular ischemic changes of the white matter. Electronically Signed   By: Hulan Saas M.D.   On: 10/31/2017 16:20   Pertinent labs &  imaging results that were available during my care of the patient were reviewed by me and considered in my medical decision making (see chart for details).  Medications Ordered in ED Medications  furosemide (LASIX) injection 20 mg (has no administration in time range)  nitroGLYCERIN (NITROGLYN) 2 % ointment 1 inch (1 inch Topical Given 10/31/17 1657)  Procedures Procedures  (including critical care time)  Medical Decision Making / ED Course I have reviewed the nursing notes for this encounter and the patient's prior records (if available in EHR or on provided paperwork).    Patient is here with epigastric and substernal chest pressure.  EKG by EMS did note ST segment elevation and anterior septal leads.  Cardiology evaluated the EKG and canceled the code STEMI.  EKG here revealed improved ST segment initial troponin negative.  Work-up was consistent with CHF exacerbation.  Given her initial apparent altered mental status, CT head was obtained which was negative.  Also noted that the patient had hyponatremia during previous admission and does have mild hyponatremia today but given her mental status improvement, I do not suspect that this is the cause.  Patient does have hyperglycemia without evidence of DKA.  Hemoglobin stable from 2 days ago.  Renal function is close to her previous baseline.  Ammonia was negative.  Negative alcohol.  Will obtain urinalysis to assess for possible urinary tract infection, which is currently still pending.   If UA is negative, will need to discuss case with cardiology regarding admission for ACS rule out.   Patient care turned over to Dr Hyacinth Meeker at [phone]. Patient case and results discussed in detail; please see their note for further ED managment.      Final Clinical Impression(s) / ED Diagnoses Final diagnoses:  Chest pain  Acute  on chronic systolic congestive heart failure (HCC)      This chart was dictated using voice recognition software.  Despite best efforts to proofread,  errors can occur which can change the documentation meaning.     Nira Conn, MD 10/31/17 1740

## 2017-10-31 NOTE — Progress Notes (Signed)
Pharmacy Antibiotic Note  Dehlia Kilner is a 77 y.o. female admitted on 10/31/2017 with UTI.  Pharmacy has been consulted for cefepime dosing. WBC wnl. Pt is hypothermic. SR 1.6 (BL ~ 1.2). CrCl ~ 30-35 mL/min.   Plan: -Start cefepime 1 gm IV Q 24 hours -Monitor CBC, renal fx, cultures and clinical progress   Height:  (147.3 cm) Weight: 114 lb (51.7 kg) IBW/kg (Calculated) : 40.9  Temp (24hrs), Avg:97.2 F (36.2 C), Min:96.9 F (36.1 C), Max:97.4 F (36.3 C)  Recent Labs  Lab 10/28/17 1215 10/30/17 0415 10/31/17 1429 10/31/17 1458  WBC 6.7 6.1 4.8  --   CREATININE 1.47* 1.52* 1.74* 1.60*  LATICACIDVEN  --   --   --  0.80    Estimated Creatinine Clearance: 20.3 mL/min (A) (by C-G formula based on SCr of 1.6 mg/dL (H)).    No Known Allergies    Thank you for allowing pharmacy to be a part of this patient's care.  Vinnie Level, PharmD., BCPS Clinical Pharmacist Clinical phone for 10/31/17 until 11pm: [phone] If after 11pm, please call main pharmacy at: [phone]

## 2017-10-31 NOTE — ED Triage Notes (Signed)
Per [facility] pt c/o epigastric pain radiating to chest since yesterday. Pt has history of MI and recent stent placement. Received 325 aspirin and 1 nitro en route. Language barrier, pt is spanish speaking.

## 2017-11-01 ENCOUNTER — Other Ambulatory Visit: Payer: Self-pay

## 2017-11-01 ENCOUNTER — Inpatient Hospital Stay (HOSPITAL_COMMUNITY): Payer: Medicaid Other

## 2017-11-01 DIAGNOSIS — I255 Ischemic cardiomyopathy: Secondary | ICD-10-CM | POA: Diagnosis present

## 2017-11-01 DIAGNOSIS — N289 Disorder of kidney and ureter, unspecified: Secondary | ICD-10-CM | POA: Diagnosis not present

## 2017-11-01 DIAGNOSIS — I5023 Acute on chronic systolic (congestive) heart failure: Secondary | ICD-10-CM | POA: Diagnosis not present

## 2017-11-01 DIAGNOSIS — I25119 Atherosclerotic heart disease of native coronary artery with unspecified angina pectoris: Secondary | ICD-10-CM | POA: Diagnosis not present

## 2017-11-01 DIAGNOSIS — B961 Klebsiella pneumoniae [K. pneumoniae] as the cause of diseases classified elsewhere: Secondary | ICD-10-CM | POA: Diagnosis present

## 2017-11-01 DIAGNOSIS — R072 Precordial pain: Secondary | ICD-10-CM

## 2017-11-01 DIAGNOSIS — I241 Dressler's syndrome: Secondary | ICD-10-CM | POA: Diagnosis present

## 2017-11-01 DIAGNOSIS — R1013 Epigastric pain: Secondary | ICD-10-CM | POA: Diagnosis not present

## 2017-11-01 DIAGNOSIS — Z8711 Personal history of peptic ulcer disease: Secondary | ICD-10-CM | POA: Diagnosis not present

## 2017-11-01 DIAGNOSIS — E871 Hypo-osmolality and hyponatremia: Secondary | ICD-10-CM | POA: Diagnosis not present

## 2017-11-01 DIAGNOSIS — Z955 Presence of coronary angioplasty implant and graft: Secondary | ICD-10-CM | POA: Diagnosis not present

## 2017-11-01 DIAGNOSIS — N3000 Acute cystitis without hematuria: Secondary | ICD-10-CM | POA: Diagnosis present

## 2017-11-01 DIAGNOSIS — Z7982 Long term (current) use of aspirin: Secondary | ICD-10-CM | POA: Diagnosis not present

## 2017-11-01 DIAGNOSIS — Z7902 Long term (current) use of antithrombotics/antiplatelets: Secondary | ICD-10-CM | POA: Diagnosis not present

## 2017-11-01 DIAGNOSIS — I2510 Atherosclerotic heart disease of native coronary artery without angina pectoris: Secondary | ICD-10-CM | POA: Diagnosis present

## 2017-11-01 DIAGNOSIS — H9190 Unspecified hearing loss, unspecified ear: Secondary | ICD-10-CM | POA: Diagnosis present

## 2017-11-01 DIAGNOSIS — I5043 Acute on chronic combined systolic (congestive) and diastolic (congestive) heart failure: Secondary | ICD-10-CM | POA: Diagnosis present

## 2017-11-01 DIAGNOSIS — E222 Syndrome of inappropriate secretion of antidiuretic hormone: Secondary | ICD-10-CM | POA: Diagnosis present

## 2017-11-01 DIAGNOSIS — Z7984 Long term (current) use of oral hypoglycemic drugs: Secondary | ICD-10-CM | POA: Diagnosis not present

## 2017-11-01 DIAGNOSIS — R627 Adult failure to thrive: Secondary | ICD-10-CM | POA: Diagnosis present

## 2017-11-01 DIAGNOSIS — I252 Old myocardial infarction: Secondary | ICD-10-CM | POA: Diagnosis not present

## 2017-11-01 DIAGNOSIS — D649 Anemia, unspecified: Secondary | ICD-10-CM | POA: Diagnosis present

## 2017-11-01 DIAGNOSIS — E1151 Type 2 diabetes mellitus with diabetic peripheral angiopathy without gangrene: Secondary | ICD-10-CM | POA: Diagnosis present

## 2017-11-01 DIAGNOSIS — I2102 ST elevation (STEMI) myocardial infarction involving left anterior descending coronary artery: Secondary | ICD-10-CM | POA: Diagnosis present

## 2017-11-01 LAB — GLUCOSE, CAPILLARY
GLUCOSE-CAPILLARY: 166 mg/dL — AB (ref 65–99)
GLUCOSE-CAPILLARY: 132 mg/dL — AB (ref 65–99)
Glucose-Capillary: 104 mg/dL — ABNORMAL HIGH (ref 65–99)
Glucose-Capillary: 95 mg/dL (ref 65–99)

## 2017-11-01 LAB — RETICULOCYTES
RBC.: 3.55 MIL/uL — ABNORMAL LOW (ref 3.87–5.11)
RETIC CT PCT: 1.1 % (ref 0.4–3.1)
Retic Count, Absolute: 39.1 10*3/uL (ref 19.0–186.0)

## 2017-11-01 LAB — BASIC METABOLIC PANEL
Anion gap: 13 (ref 5–15)
BUN: 24 mg/dL — AB (ref 6–20)
CALCIUM: 8.6 mg/dL — AB (ref 8.9–10.3)
CO2: 22 mmol/L (ref 22–32)
CREATININE: 1.74 mg/dL — AB (ref 0.44–1.00)
Chloride: 99 mmol/L — ABNORMAL LOW (ref 101–111)
GFR calc Af Amer: 31 mL/min — ABNORMAL LOW (ref >60)
GFR calc non Af Amer: 27 mL/min — ABNORMAL LOW (ref >60)
GLUCOSE: 126 mg/dL — AB (ref 65–99)
Potassium: 3.7 mmol/L (ref 3.5–5.1)
Sodium: 134 mmol/L — ABNORMAL LOW (ref 135–145)

## 2017-11-01 LAB — FOLATE: Folate: 12.5 ng/mL (ref >5.9)

## 2017-11-01 LAB — CBC
HCT: 28.7 % — ABNORMAL LOW (ref 36.0–46.0)
Hemoglobin: 9.3 g/dL — ABNORMAL LOW (ref 12.0–15.0)
MCH: 26.2 pg (ref 26.0–34.0)
MCHC: 32.4 g/dL (ref 30.0–36.0)
MCV: 80.8 fL (ref 78.0–100.0)
Platelets: 410 10*3/uL — ABNORMAL HIGH (ref 150–400)
RBC: 3.55 MIL/uL — ABNORMAL LOW (ref 3.87–5.11)
RDW: 13.6 % (ref 11.5–15.5)
WBC: 4.8 10*3/uL (ref 4.0–10.5)

## 2017-11-01 LAB — IRON AND TIBC
IRON: 28 ug/dL (ref 28–170)
SATURATION RATIOS: 9 % — AB (ref 10.4–31.8)
TIBC: 297 ug/dL (ref 250–450)
UIBC: 269 ug/dL

## 2017-11-01 LAB — TROPONIN I
TROPONIN I: 0.05 ng/mL — AB (ref <0.03)
Troponin I: 0.05 ng/mL (ref <0.03)

## 2017-11-01 LAB — FERRITIN: FERRITIN: 126 ng/mL (ref 11–307)

## 2017-11-01 LAB — VITAMIN B12: Vitamin B-12: >7500 pg/mL — ABNORMAL HIGH (ref 180–914)

## 2017-11-01 MED ORDER — SUCRALFATE 1 G PO TABS
1.0000 g | ORAL_TABLET | Freq: Three times a day (TID) | ORAL | Status: DC
  Administered 2017-11-01 – 2017-11-03 (×8): 1 g via ORAL
  Filled 2017-11-01 (×8): qty 1

## 2017-11-01 MED ORDER — DICYCLOMINE HCL 10 MG PO CAPS
10.0000 mg | ORAL_CAPSULE | Freq: Three times a day (TID) | ORAL | Status: DC
  Administered 2017-11-01 – 2017-11-03 (×6): 10 mg via ORAL
  Filled 2017-11-01 (×6): qty 1

## 2017-11-01 MED ORDER — GI COCKTAIL ~~LOC~~
30.0000 mL | Freq: Two times a day (BID) | ORAL | Status: DC | PRN
  Administered 2017-11-01: 30 mL via ORAL
  Filled 2017-11-01: qty 30

## 2017-11-01 MED ORDER — DIPHENHYDRAMINE HCL 25 MG PO CAPS
25.0000 mg | ORAL_CAPSULE | Freq: Four times a day (QID) | ORAL | Status: DC | PRN
  Administered 2017-11-01: 25 mg via ORAL
  Filled 2017-11-01: qty 1

## 2017-11-01 NOTE — Progress Notes (Signed)
PROGRESS NOTE    Sheila White  ZOX:[id number] DOB: 1941/04/14 DOA: 10/31/2017 PCP: Julieanne Manson, MD   Outpatient Specialists:     Brief Narrative:  Sheila White is a 77 y.o. female with medical history significant for type 2 diabetes mellitus, coronary artery disease,  STEMI last month treated with DES to LAD, and systolic CHF, now presenting to the emergency department for evaluation of abdominal pain, shortness of breath, nonproductive cough, and fatigue.  Patient was discharged from the hospital on 10/24/2017 after management of STEMI.  She developed systolic CHF with EF 30% but diuresis was complicated by hyponatremia and acute kidney injury.  Since returning home, she has had progressive dyspnea with orthopnea, nonproductive cough, and fatigue.  She denies fevers or chills.  She has also been experiencing abdominal pain with mild nausea but no vomiting or diarrhea.  She had recently undergone extensive inpatient work-up for the abdominal pain which was negative, and she was started on twice daily Protonix for suspected PUD.  EMS was called for transport to the hospital, there was concern that the abdominal pain may be anginal and she was treated with aspirin and nitroglycerin prior to arrival in the ED.     Assessment & Plan:   Principal Problem:   Acute on chronic systolic congestive heart failure (HCC) Active Problems:   Abdominal pain   Hyponatremia   Renal insufficiency   Normocytic anemia   Coronary artery disease   Chest pain   Acute lower UTI  Epigastric/stomach pain -?need for EGD -GI consult -PPI and carafate  Acute systolic CHF  - Presents with abdominal pain, mild confusion, and SOB with orthopnea and non-productive cough  - Rales on exam, interstitial edema on CXR, marked BNP elevation  - Diuresis during recent admission was complicated by renal insufficiency and hyponatremia  - Given Lasix 20 mg IV in ED   - diuresis per cardiology  Hyponatremia  -  Serum sodium is 128 on admission, similar to recent priors  - fluid restrict  Renal insufficiency  - SCr is 1.74 on admission, up from 1.52 the day before, but down from 3.86 last month  - monitor closely -? What her actual baseline is  CAD - Presents with abdominal pain, reports this to be different than angina she had last month -- but discomfort was fixed by nitro - ST abnormalities on admission EKG reviewed by cardiology and secondary to recent MI  - troponin flat - Continue DAPT, continue beta-blocker, statin, nitrates; ACE/ARB precluded by renal insufficiency     UTI  - UA suggestive of infection but was contaminated -appears culture never sent - Continue empiric abx while in hospital   Type II DM  - A1c was 6.6% last month  - Managed at home with glimepiride and Tradjenta, held on admission  - Follow CBG's and use a SSI with Novolog while in hospital     Anemia  - Hgb is 8.5 on admission  - No bleeding evident, slightly lower than priors in setting of hypervolemia       DVT prophylaxis:  SQ Heparin  Code Status: Full Code   Family Communication: Multiple at bedside  Disposition Plan:     Consultants:   GI  cards   Subjective: C/o upper abdominal pain-- pressure improved with nitro  Objective: Vitals:   11/01/17 0356 11/01/17 0400 11/01/17 0434 11/01/17 0902  BP: (!) 144/70 (!) 134/58 139/78 125/71  Pulse: 85  91 84  Resp: 15 17 19  Temp:   97.7 F (36.5 C)   TempSrc:   Oral   SpO2: 98%  99%   Weight:   49.9 kg (109 lb 14.4 oz)   Height:        Intake/Output Summary (Last 24 hours) at 11/01/2017 1412 Last data filed at 11/01/2017 1200 Gross per 24 hour  Intake 460 ml  Output 600 ml  Net -140 ml   Filed Weights   10/31/17 1438 11/01/17 0434  Weight: 51.7 kg (114 lb) 49.9 kg (109 lb 14.4 oz)    Examination:  General exam: seen with translator, family does the majority of talking- NAD Respiratory system: Clear to auscultation.  Respiratory effort normal. Cardiovascular system: S1 & S2 heard, RRR. No JVD, murmurs, rubs, gallops or clicks. No pedal edema. Gastrointestinal system: Abdomen is nondistended, soft and nontender. No organomegaly or masses felt. Normal bowel sounds heard. Central nervous system: Alert and oriented. No focal neurological deficits. Extremities: Symmetric 5 x 5 power. Skin: No rashes, lesions or ulcers Psychiatry: Judgement and insight appear normal. Mood & affect appropriate.     Data Reviewed: I have personally reviewed following labs and imaging studies  CBC: Recent Labs  Lab 10/28/17 1215 10/30/17 0415 10/31/17 1429 10/31/17 1458 11/01/17 0413  WBC 6.7 6.1 4.8  --  4.8  NEUTROABS  --  3.4 2.5  --   --   HGB 9.5* 8.5* 8.5* 8.8* 9.3*  HCT 29.7* 26.5* 26.0* 26.0* 28.7*  MCV 84 81.8 80.7  --  80.8  PLT 366 361 385  --  410*   Basic Metabolic Panel: Recent Labs  Lab 10/28/17 1215 10/30/17 0415 10/31/17 1429 10/31/17 1458 11/01/17 0413  NA 134 129* 128* 128* 134*  K 5.3* 4.5 4.1 4.1 3.7  CL 99 98* 96* 95* 99*  CO2 19* 20* 22  --  22  GLUCOSE 183* 143* 225* 222* 126*  BUN 24 24* 27* 25* 24*  CREATININE 1.47* 1.52* 1.74* 1.60* 1.74*  CALCIUM 8.9 8.5* 8.2*  --  8.6*   GFR: Estimated Creatinine Clearance: 18.4 mL/min (A) (by C-G formula based on SCr of 1.74 mg/dL (H)). Liver Function Tests: Recent Labs  Lab 10/31/17 1429  AST 20  ALT 15  ALKPHOS 73  BILITOT 0.4  PROT 6.0*  ALBUMIN 2.8*   Recent Labs  Lab 10/31/17 2152  LIPASE 51   Recent Labs  Lab 10/31/17 1430  AMMONIA 12   Coagulation Profile: No results for input(s): INR, PROTIME in the last 168 hours. Cardiac Enzymes: Recent Labs  Lab 10/31/17 2152 11/01/17 0413 11/01/17 0950  TROPONINI 0.05* 0.05* 0.05*   BNP (last 3 results) No results for input(s): PROBNP in the last 8760 hours. HbA1C: No results for input(s): HGBA1C in the last 72 hours. CBG: Recent Labs  Lab 10/31/17 1450  10/31/17 2151 11/01/17 0748 11/01/17 1223  GLUCAP 206* 120* 104* 166*   Lipid Profile: No results for input(s): CHOL, HDL, LDLCALC, TRIG, CHOLHDL, LDLDIRECT in the last 72 hours. Thyroid Function Tests: No results for input(s): TSH, T4TOTAL, FREET4, T3FREE, THYROIDAB in the last 72 hours. Anemia Panel: Recent Labs    11/01/17 0413  VITAMINB12 >7,500*  FOLATE 12.5  FERRITIN 126  TIBC 297  IRON 28  RETICCTPCT 1.1   Urine analysis:    Component Value Date/Time   COLORURINE YELLOW 10/31/2017 1920   APPEARANCEUR HAZY (A) 10/31/2017 1920   LABSPEC 1.005 10/31/2017 1920   PHURINE 6.0 10/31/2017 1920   GLUCOSEU NEGATIVE 10/31/2017 1920  HGBUR MODERATE (A) 10/31/2017 1920   BILIRUBINUR NEGATIVE 10/31/2017 1920   KETONESUR NEGATIVE 10/31/2017 1920   PROTEINUR NEGATIVE 10/31/2017 1920   NITRITE NEGATIVE 10/31/2017 1920   LEUKOCYTESUR LARGE (A) 10/31/2017 1920     )No results found for this or any previous visit (from the past 240 hour(s)).    Anti-infectives (From admission, onward)   Start     Dose/Rate Route Frequency Ordered Stop   11/01/17 0000  ceFEPIme (MAXIPIME) 1 g in sodium chloride 0.9 % 100 mL IVPB     1 g 200 mL/hr over 30 Minutes Intravenous Every 24 hours 10/31/17 2133     10/31/17 2100  cefTRIAXone (ROCEPHIN) 1 g in sodium chloride 0.9 % 100 mL IVPB     1 g 200 mL/hr over 30 Minutes Intravenous  Once 10/31/17 2053 10/31/17 2141       Radiology Studies: Dg Chest 2 View  Result Date: 10/31/2017 CLINICAL DATA:  Epigastric pain radiating into the chest since yesterday. Recent MI with coronary stent placement. CHF. Nonsmoker. EXAM: CHEST - 2 VIEW COMPARISON:  PA and lateral chest x-ray of Oct 30, 2017 FINDINGS: The lungs are well-expanded. The interstitial markings remain increased. Patchy densities are present at both lung bases. Small amounts of pleural fluid are noted on the right. The cardiac silhouette is mildly enlarged. There is calcification in the wall  of the aortic arch. The trachea is midline. The bony thorax exhibits no acute abnormality. IMPRESSION: CHF with mild interstitial edema. Bibasilar atelectasis. Small right and trace left pleural effusions. Thoracic aortic atherosclerosis. Electronically Signed   By: David  [place] M.D.   On: 10/31/2017 16:21   Ct Head Wo Contrast  Result Date: 10/31/2017 CLINICAL DATA:  77 year old presenting with acute onset of occipital headache, nausea and vomiting, and acute mental status changes. EXAM: CT HEAD WITHOUT CONTRAST TECHNIQUE: Contiguous axial images were obtained from the base of the skull through the vertex without intravenous contrast. COMPARISON:  None. FINDINGS: Brain: Moderate cortical, deep and cerebellar atrophy. Remote strokes involving both cerebellar hemispheres. Mild changes of small vessel disease of the white matter diffusely. No mass lesion. No midline shift. No acute hemorrhage or hematoma. No extra-axial fluid collections. No evidence of acute infarction. Vascular: Moderate BILATERAL carotid siphon and vertebral artery atherosclerosis. No hyperdense vessel. Skull: No skull fracture or other focal osseous abnormality involving the skull. Sinuses/Orbits: Visualized paranasal sinuses, bilateral mastoid air cells and bilateral middle ear cavities well-aerated. Bony nasal septal deviation to the LEFT. Visualized orbits and globes normal in appearance. Other: None. IMPRESSION: 1. No acute intracranial abnormality. 2. Remote strokes involving both cerebellar hemispheres. 3. Moderate generalized atrophy and mild chronic microvascular ischemic changes of the white matter. Electronically Signed   By: Hulan Saas M.D.   On: 10/31/2017 16:20        Scheduled Meds: . aspirin  81 mg Oral Daily  . atorvastatin  80 mg Oral q1800  . carvedilol  3.125 mg Oral BID WC  . dicyclomine  10 mg Oral TID AC  . furosemide  20 mg Intravenous Q12H  . heparin  5,000 Units Subcutaneous Q8H  . insulin aspart   0-5 Units Subcutaneous QHS  . insulin aspart  0-9 Units Subcutaneous TID WC  . isosorbide mononitrate  15 mg Oral Daily  . pantoprazole  40 mg Oral BID  . potassium chloride  10 mEq Oral BID  . sucralfate  1 g Oral TID WC & HS  . ticagrelor  90 mg Oral  BID   Continuous Infusions: . ceFEPime (MAXIPIME) IV Stopped (11/01/17 0049)     LOS: 0 days    Time spent: 35 min    Joseph Art, DO [facility] Pager [phone]  If 7PM-7AM, please contact night-coverage [email or web address] Password [email or web address] 11/01/2017, 2:12 PM

## 2017-11-01 NOTE — Consult Note (Addendum)
Cardiology Consultation:   Patient ID: Sheila White; [id number]; Nov 02, 1940   Admit date: 10/31/2017 Date of Consult: 11/01/2017  Primary Care Provider: Julieanne Manson, MD Primary Cardiologist: Dr Tresa Endo, in-hospital 10/21/2017 Primary Electrophysiologist:  n/a   Patient Profile:   Sheila White is a 77 y.o. female with a hx of NIDDM, HOH, admit 10/05/2017 w/ STEMI, late presentation s/p DES LA, acute S-D-CHF w/ EF 30% w/ grade 2 dd, AKI, hyponatremia, abd pain, anemia, weakness , EF 30% with grade 2 diastolic, postinfarction pericarditis, d/c on colchicine.  Weight at discharge 111 pounds the Lasix stopped due to hyponatremia with a sodium of 128 at discharge (116 on admission).  Peak creatinine 3.86 2nd CIN, cr 1.88 at discharge.      Possible PUD, Carafate stopped at discharge, on PPI.      Anemia w/ Hemoglobin 9.3 at discharge.     DM w/ Metformin stopped and patient on Amaryl and Tradjenta at discharge  Pt seen in office 05/03 w/ non-productive cough, no fevers, no CP, wt stable at home, mild daytime edema, no SOB.   Pt in ER 05/05 and given narcotic cough medicine, also felt volume overloaded and given Lasix 80 mg IV x 1. D/c home.   Pt came back to the ER 05/06 with chest pain and SOB, cardiology asked by Dr Benjamine Mola to evaluate her.   History of Present Illness:   Ms. Sheila White was interviewed with the help of the video interpreter.  Pt states the pain started a couple of days ago. It is in her epigastric area, severe. No change w/ deep inspiration but the area is tender to palpation. She is a little nauseated, no vomiting. No melena.  She is anxious about the pain, because it is so bad.   She is still coughing at times.   Breathing is ok today.   The pain does not remind her of her MI.   She has had this pain before but is not able to be specific about when or any other details.    Past Medical History:  Diagnosis Date  . Acute combined systolic and diastolic CHF, NYHA  class 3 (HCC) 10/10/2017   in setting of MI  . HOH (hard of hearing)   . Ischemic cardiomyopathy 10/05/2017  . Non-insulin dependent type 2 diabetes mellitus (HCC)   . 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 STEMI involving left anterior descending coronary artery (HCC) 10/05/2017    Past Surgical History:  Procedure Laterality Date  . CORONARY/GRAFT ACUTE MI REVASCULARIZATION N/A 10/05/2017   Procedure: Coronary/Graft Acute MI Revascularization;  Surgeon: Lennette Bihari, MD;  Location: [facility] INVASIVE CV LAB;  Service: Cardiovascular;  Laterality: N/A;  . LEFT HEART CATH AND CORONARY ANGIOGRAPHY N/A 10/05/2017   Procedure: LEFT HEART CATH AND CORONARY ANGIOGRAPHY;  Surgeon: Lennette Bihari, MD;  Location: [facility] INVASIVE CV LAB;  Service: Cardiovascular;  Laterality: N/A;     Prior to Admission medications   Medication Sig Start Date End Date Taking? Authorizing Provider  aspirin 81 MG chewable tablet Chew 1 tablet (81 mg total) by mouth daily. 10/25/17  Yes Georgie Chard D, NP  atorvastatin (LIPITOR) 80 MG tablet Take 1 tablet (80 mg total) by mouth daily. 10/24/17  Yes Georgie Chard D, NP  carvedilol (COREG) 3.125 MG tablet Take 3.125 mg by mouth 2 (two) times daily with a meal.   Yes [provider]  colchicine 0.6 MG tablet Take 1 tablet (0.6 mg total) by mouth daily for 13 days.  10/25/17 11/07/17 Yes Georgie Chard D, NP  furosemide (LASIX) 20 MG tablet Take 1 tablet (20 mg total) by mouth daily. 10/30/17  Yes Rolland Porter, MD  glimepiride (AMARYL) 2 MG tablet Take 1 tablet (2 mg total) by mouth daily with breakfast. 10/25/17  Yes Lennette Bihari, MD  isosorbide mononitrate (IMDUR) 30 MG 24 hr tablet Take 0.5 tablets (15 mg total) by mouth daily. 10/25/17  Yes Georgie Chard D, NP  loratadine (CLARITIN) 10 MG tablet Take 1 tablet (10 mg total) by mouth daily. 10/25/17  Yes Georgie Chard D, NP  nitroGLYCERIN (NITROSTAT) 0.4 MG SL tablet Place 1 tablet (0.4 mg total) under the tongue  every 5 (five) minutes x 3 doses as needed for chest pain. 10/24/17  Yes Georgie Chard D, NP  pantoprazole (PROTONIX) 40 MG tablet Take 1 tablet (40 mg total) by mouth 2 (two) times daily. 10/24/17  Yes Georgie Chard D, NP  potassium chloride (K-DUR) 10 MEQ tablet Take 1 tablet (10 mEq total) by mouth 2 (two) times daily. 10/30/17  Yes Rolland Porter, MD  ticagrelor (BRILINTA) 90 MG TABS tablet Take 1 tablet (90 mg total) by mouth 2 (two) times daily. 10/24/17  Yes Georgie Chard D, NP  carvedilol (COREG) 6.25 MG tablet Take 1 tablet (6.25 mg total) by mouth 2 (two) times daily with a meal. 10/28/17   Barrett, Joline Salt, PA-C  chlorpheniramine-HYDROcodone (TUSSIONEX PENNKINETIC ER) 10-8 MG/5ML SUER Take 5 mLs by mouth at bedtime as needed for cough. 10/30/17   Rolland Porter, MD  linagliptin (TRADJENTA) 5 MG TABS tablet Take 1 tablet (5 mg total) by mouth daily. 10/24/17   Filbert Schilder, NP    Inpatient Medications: Scheduled Meds: . aspirin  81 mg Oral Daily  . atorvastatin  80 mg Oral q1800  . carvedilol  3.125 mg Oral BID WC  . furosemide  20 mg Intravenous Q12H  . heparin  5,000 Units Subcutaneous Q8H  . insulin aspart  0-5 Units Subcutaneous QHS  . insulin aspart  0-9 Units Subcutaneous TID WC  . isosorbide mononitrate  15 mg Oral Daily  . pantoprazole  40 mg Oral BID  . potassium chloride  10 mEq Oral BID  . ticagrelor  90 mg Oral BID   Continuous Infusions: . ceFEPime (MAXIPIME) IV Stopped (11/01/17 0049)   PRN Meds: acetaminophen, diphenhydrAMINE, guaiFENesin-dextromethorphan, nitroGLYCERIN, ondansetron (ZOFRAN) IV  Allergies:   No Known Allergies  Social History:   Social History   Socioeconomic History  . Marital status: Widowed    Spouse name: Not on file  . Number of children: Not on file  . Years of education: Not on file  . Highest education level: Not on file  Occupational History  . Not on file  Social Needs  . Financial resource strain: Not on file  . Food insecurity:     Worry: Not on file    Inability: Not on file  . Transportation needs:    Medical: Not on file    Non-medical: Not on file  Tobacco Use  . Smoking status: Never Smoker  . Smokeless tobacco: Never Used  Substance and Sexual Activity  . Alcohol use: Not Currently  . Drug use: Never  . Sexual activity: Not Currently  Lifestyle  . Physical activity:    Days per week: Not on file    Minutes per session: Not on file  . Stress: Not on file  Relationships  . Social connections:    Talks on phone: Not  on file    Gets together: Not on file    Attends religious service: Not on file    Active member of club or organization: Not on file    Attends meetings of clubs or organizations: Not on file    Relationship status: Not on file  . Intimate partner violence:    Fear of current or ex partner: Not on file    Emotionally abused: Not on file    Physically abused: Not on file    Forced sexual activity: Not on file  Other Topics Concern  . Not on file  Social History Narrative  . Not on file    Family History:   History reviewed. No pertinent family history. Family Status:  No family status information on file.    ROS:  Please see the history of present illness.  All other ROS reviewed and negative.     Physical Exam/Data:   Vitals:   11/01/17 0356 11/01/17 0400 11/01/17 0434 11/01/17 0902  BP: (!) 144/70 (!) 134/58 139/78 125/71  Pulse: 85  91 84  Resp: Temp:   97.7 F (36.5 C)   TempSrc:   Oral   SpO2: 98%  99%   Weight:   109 lb 14.4 oz (49.9 kg)   Height:        Intake/Output Summary (Last 24 hours) at 11/01/2017 1230 Last data filed at 11/01/2017 1100 Gross per 24 hour  Intake 100 ml  Output 600 ml  Net -500 ml   Filed Weights   10/31/17 1438 11/01/17 0434  Weight: 114 lb (51.7 kg) 109 lb 14.4 oz (49.9 kg)   Body mass index is 22.97 kg/m.  General:  Well nourished, elderly female, in acute distress HEENT: normal Lymph: no adenopathy Neck:  minimal JVD Endocrine:  No thryomegaly Vascular: No carotid bruits; 4/4 extremity pulses 2+, without bruits  Cardiac:  normal S1, S2; RRR; no murmur, no rub appreciated Lungs: decreased BS bases bilaterally, no wheezing, rhonchi, few rales  Abd: soft, + tender upper abd, no hepatomegaly  Ext: no edema Musculoskeletal:  No deformities, BUE and BLE strength normal and equal Skin: warm and dry  Neuro:  CNs 2-12 intact, no focal abnormalities noted Psych: Anxious affect   EKG:  The EKG was personally reviewed and demonstrates:  SR, HR 88, inferolateral Q waves unchanged from 10/30/2017, ST segments improved from 10/21/2017 Telemetry:  Telemetry was personally reviewed and demonstrates:  SR, rare PVCs  Relevant CV Studies:  ECHO: 10/06/17 Study Conclusions - Left ventricle: The cavity size was normal. There was mild focal basal hypertrophy of the septum. Mid to apical inferoseptal/anteroseptal akinesis, apical lateral akinesis, mid to apical anterior akinesis, apical inferior akinesis, akinesis of the true apex. The estimated ejection fraction was 30%. Features are consistent with a pseudonormal left ventricular filling pattern, with concomitant abnormal relaxation and increased filling pressure (grade 2 diastolic dysfunction). - Aortic valve: Trileaflet; moderately calcified leaflets. Sclerosis without stenosis. There was trivial regurgitation. - Mitral valve: Mildly calcified annulus. There was trivial regurgitation. - Right ventricle: The cavity size was normal. Systolic function was normal. - Pulmonary arteries: No complete TR doppler jet so unable to estimate PA systolic pressure. - Inferior vena cava: The vessel was normal in size. The respirophasic diameter changes were in the normal range (= 50%), consistent with normal central venous pressure. - Pericardium, extracardiac: A trivial pericardial effusion was identified. Impressions: - Normal  LV size with mild focal basal septal hypertrophy.  EF 30% with wall motion abnormalities as noted above in LAD distribution. Normal RV size and systolic function. Aortic valve sclerosis without significant stenosis.   CATH: 10/05/17 Conclusion    Mid LAD lesion is 90% stenosed.  Prox LAD lesion is 99% stenosed.  Dist LAD lesion is 99% stenosed.  1st Mrg lesion is 95% stenosed.  Ost 2nd Mrg to 2nd Mrg lesion is 30% stenosed.  Ost RPDA to RPDA lesion is 30% stenosed.  Post intervention, there is a 0% residual stenosis.  Post intervention, there is a 0% residual stenosis.  Post intervention, there is a 35% residual stenosis.  A stent was successfully placed.  A stent was successfully placed. Acute late presentation anterior ST segment elevation myocardial infarction with demonstration of subtotal long proximal LAD stenosis, 90% mid stenosis, and 99% apical stenosis with reduced apical flow. Left circumflex disease with 50% proximal OM1 stenosis followed by distal 95% marginal stenosis prior to giving off distal branches in the OM1 vessel.  Mild diffuse irregularity of a large dominant RCA with 30% narrowing in the PDA vessel. LVEDP 34 mmHg/ Successful PCI to the LAD with ultimate insertion of a 2.526 mm Resolute DES stent postdilated to 2.8 mm proximally and 2.68 mm distally with the 99% stenosis being reduced to 0%; DES stenting of the mid 90% stenosis with ultimate insertion of a 2.2512 mm Resolute DES stent with residual narrowing at 0%, and initial 99% apical thrombotic stenosis with reduced flow treated with low-level PTCA and intracoronary verapamil with improvement to approximately 35-40% in a very small apical LAD segment. RECOMMENDATION: DAPT for minimum of 1 year. Diuresis with elevated EDP. An echo Doppler study will be obtained in a.m. Patient should be started on low-dose ACE inhibition, carvedilol, probable nitrate therapy, and high potency statin  treatment.  Post-Intervention Diagram       Laboratory Data:  Chemistry Recent Labs  Lab 10/30/17 0415 10/31/17 1429 10/31/17 1458 11/01/17 0413  NA 129* 128* 128* 134*  K 4.5 4.1 4.1 3.7  CL 98* 96* 95* 99*  CO2 20* 22  --  22  GLUCOSE 143* 225* 222* 126*  BUN 24* 27* 25* 24*  CREATININE 1.52* 1.74* 1.60* 1.74*  CALCIUM 8.5* 8.2*  --  8.6*  GFRNONAA 31* 27*  --  27*  GFRAA 36* 31*  --  31*  ANIONGAP 11 10  --  13    Lab Results  Component Value Date   ALT 15 10/31/2017   AST 20 10/31/2017   ALKPHOS 73 10/31/2017   BILITOT 0.4 10/31/2017   Hematology Recent Labs  Lab 10/30/17 0415 10/31/17 1429 10/31/17 1458 11/01/17 0413  WBC 6.1 4.8  --  4.8  RBC 3.24* 3.22*  --  3.55*  3.55*  HGB 8.5* 8.5* 8.8* 9.3*  HCT 26.5* 26.0* 26.0* 28.7*  MCV 81.8 80.7  --  80.8  MCH 26.2 26.4  --  26.2  MCHC 32.1 32.7  --  32.4  RDW 13.7 13.6  --  13.6  PLT 361 385  --  410*   Cardiac Enzymes Recent Labs  Lab 10/31/17 2152 11/01/17 0413 11/01/17 0950  TROPONINI 0.05* 0.05* 0.05*    Recent Labs  Lab 10/30/17 0422 10/31/17 1457  TROPIPOC 0.04 0.03    BNP Recent Labs  Lab 10/30/17 0415 10/31/17 1431  BNP 1,990.1* 1,648.0*    TSH:  Lab Results  Component Value Date   TSH 3.377 10/05/2017   Lipids: Lab Results  Component Value Date  CHOL 161 10/06/2017   HDL 49 10/06/2017   LDLCALC 90 10/06/2017   TRIG 110 10/06/2017   CHOLHDL 3.3 10/06/2017   HgbA1c: Lab Results  Component Value Date   HGBA1C 6.6 (H) 10/05/2017    Radiology/Studies:  Dg Chest 2 View  Result Date: 10/31/2017 CLINICAL DATA:  Epigastric pain radiating into the chest since yesterday. Recent MI with coronary stent placement. CHF. Nonsmoker. EXAM: CHEST - 2 VIEW COMPARISON:  PA and lateral chest x-ray of Oct 30, 2017 FINDINGS: The lungs are well-expanded. The interstitial markings remain increased. Patchy densities are present at both lung bases. Small amounts of pleural fluid are noted  on the right. The cardiac silhouette is mildly enlarged. There is calcification in the wall of the aortic arch. The trachea is midline. The bony thorax exhibits no acute abnormality. IMPRESSION: CHF with mild interstitial edema. Bibasilar atelectasis. Small right and trace left pleural effusions. Thoracic aortic atherosclerosis. Electronically Signed   By: David  [place] M.D.   On: 10/31/2017 16:21   Dg Chest 2 View  Result Date: 10/30/2017 CLINICAL DATA:  Shortness of breath EXAM: CHEST - 2 VIEW COMPARISON:  10/28/2017, 10/18/2017 FINDINGS: Small bilateral pleural effusions. Cardiomegaly with vascular congestion and moderate pulmonary edema. Bibasilar consolidations. Aortic atherosclerosis. No pneumothorax. IMPRESSION: 1. Cardiomegaly with vascular congestion and mild to moderate pulmonary edema. 2. Small pleural effusions with bibasilar atelectasis or pneumonia. Electronically Signed   By: Jasmine Pang M.D.   On: 10/30/2017 03:50   Dg Chest 2 View  Result Date: 10/29/2017 CLINICAL DATA:  77 year old female with shortness of breath and cough for 1 week. EXAM: CHEST - 2 VIEW COMPARISON:  Chest x-ray 10/18/2017. FINDINGS: Patchy multifocal interstitial and airspace disease noted throughout the mid to lower lungs bilaterally. Small bilateral pleural effusions. No evidence of pulmonary edema. Heart size is normal. Upper mediastinal contours are within normal limits. Aortic atherosclerosis. IMPRESSION: 1. Findings are concerning for multilobar bronchopneumonia throughout the lung bases with associated small bilateral parapneumonic pleural effusions. 2. Aortic atherosclerosis. Electronically Signed   By: Trudie Reed M.D.   On: 10/29/2017 09:43   Ct Head Wo Contrast  Result Date: 10/31/2017 CLINICAL DATA:  77 year old presenting with acute onset of occipital headache, nausea and vomiting, and acute mental status changes. EXAM: CT HEAD WITHOUT CONTRAST TECHNIQUE: Contiguous axial images were obtained from the  base of the skull through the vertex without intravenous contrast. COMPARISON:  None. FINDINGS: Brain: Moderate cortical, deep and cerebellar atrophy. Remote strokes involving both cerebellar hemispheres. Mild changes of small vessel disease of the white matter diffusely. No mass lesion. No midline shift. No acute hemorrhage or hematoma. No extra-axial fluid collections. No evidence of acute infarction. Vascular: Moderate BILATERAL carotid siphon and vertebral artery atherosclerosis. No hyperdense vessel. Skull: No skull fracture or other focal osseous abnormality involving the skull. Sinuses/Orbits: Visualized paranasal sinuses, bilateral mastoid air cells and bilateral middle ear cavities well-aerated. Bony nasal septal deviation to the LEFT. Visualized orbits and globes normal in appearance. Other: None. IMPRESSION: 1. No acute intracranial abnormality. 2. Remote strokes involving both cerebellar hemispheres. 3. Moderate generalized atrophy and mild chronic microvascular ischemic changes of the white matter. Electronically Signed   By: Hulan Saas M.D.   On: 10/31/2017 16:20    Assessment and Plan:   Principal Problem: 1.  Acute on chronic systolic congestive heart failure (HCC) - wt at 109 is below base weight, but po intake has been poor for a few days - Cr trending up but BUN  stable. - recheck in am, consider changing to po Lasix at that time  2.   Coronary artery disease - s/p DES LAS 10/05/2017 - would not stop the Brilinta unless absolutely necessary - MD advise on holding ASA - she has a substrate for angina w/ 95% OM, but no clearly anginal sx seen - continue rx w/ Coreg 3.125 mg bid, Lipitor 80 mg qd, Imdur 15 mg qd in addition to ASA/Brilinta - think BP/HR high enough to increase Coreg to 6.25 mg bid  3. ICM - EF 30% at echo - no ACE/ARB due to renal insufficiency - on BB, increase dose - recheck echo in 3 months  4. Abnormal CXR - CXR done 05/04 showed PNA - on Cefipime,  per IM  Otherwise, per IM. Will give GI cocktail to see if this helps her sx. GI is seeing. Active Problems:   Abdominal pain   Hyponatremia   Renal insufficiency   Normocytic anemia   Acute lower UTI   For questions or updates, please contact [facility] Please consult [email or web address] for contact info under Cardiology/STEMI.   Signed, [name], PA-C  11/01/2017 12:30 PM   I have examined the patient and reviewed assessment and plan and discussed with patient.  Agree with above as stated.  Chest pain likely noncardiac.  Negative troponin.  PCI less than 1 months ago.  No acute changes on ECG>  WOuld be hesitant to stop Brilinta this early post stent if other procedure needed.  D/w GI, could consider diagnostic only EGD on Brilinta.  Will give GI cocktail.  Treated for pericarditis as well.    Diuresing for heart failure.  Lance Muss

## 2017-11-01 NOTE — Progress Notes (Signed)
Pt tx from 5C08, family at bedside oriented to unit and routines, pain management discussed,son explained to pt and denies pain at this time, verbalized understanding of safety concerns

## 2017-11-01 NOTE — Consult Note (Addendum)
[facility] Consult: 12:40 PM 11/01/2017  LOS: 0 days    Referring Provider: Dr Benjamine Mola  Primary Care Physician:  Julieanne Manson, MD Primary Gastroenterologist:  Dr. Adela Lank.      Reason for Consultation:  Abdominal pain.     HPI: Sheila White is a 77 y.o. female.  Speaks no English.  History NIDDM.  No previous EGD or colonoscopy.  Admission in early 09/2017 with acute MI.  S/p 4/10 cardiac cath with angioplasty and DES placed to LAD.  EF by echo 30%.  Postprocedural chest and upper abdominal pain attributed to pericarditis, treated with Decadron and colchicine.   Seen in evaluation by Dr. Adela Lank for abdominal pain.  For 3 weeks PTA she had anorexia.  She described pain in 2 separate locations one in the epigastrium radiating into her chest, generally postprandial the other pain was lower abdominal and this resolved after large bowel movement following a period of constipation. She underwent abdominal ultrasound showing distended GB but no overt cholecystitis or gallstones. Noncontrast CT showed distended gallbladder and biliary sludge otherwise unremarkable.  HIDA scan was only a partial study, aborted due to patient developing diarrhea.  Though limited it did not show any evidence for acute cholecystitis, cystic duct was patent, unable to assess patency of CBD due to early termination of exam Labs revealed normal LFTs, lipase.  C. difficile negative. Since her symptoms improved after empiric treatment with PPI, Dr. Adela Lank signed off. He held off pursuing EGD because of the acute MI but suggested it might be helpful if the symptoms recurred.Marland Kitchen  He suggested continuing PPI treatment. At discharge she continued on colchicine, Brilinta, ASA 81 mg, Protonix 40 mg twice daily  Patient was started on Lasix  2 days ago for dry cough and CHF flare (BNP 1990), after evaluation in the ED by cardiology.  After going home she had episode of burning, pressure-like pain in the epigastrium that lasted about 4 hours.  This was not associated with nausea or vomiting.  It was not triggered by food.  It did not radiate to the back.  She is been compliant with twice daily pantoprazole.  The only thing has helped the pain is Valium.  The one incident 2 days ago resolved of its own accord.  It was similar to the pain she had 3 weeks ago.  It recurred yesterday and the family returned their mother to the ED.  Again there was no nausea or vomiting.  No diarrhea.  She has had 2 or 3 soft, formed stools yesterday.  Some urinary frequency. Patient returned to the ED yesterday afternoon with these symptoms.  There were inferior ST elevation on EKG, chest x-ray suggesting CHF.  Head CT negative for acute abnormality. Lipase 51. LFTs normal though albumin low at 2.8.  Stable hyponatremia at 128.  Elevated BUN, 1648.  Troponins 0.05 x 3.  . AKI.    Stable Lowman anemia.    U/A with +++ bacteria, WBCs, Leukocytes.        Past Medical History:  Diagnosis Date  . Acute  combined systolic and diastolic CHF, NYHA class 3 (HCC) 10/10/2017   in setting of MI  . HOH (hard of hearing)   . Ischemic cardiomyopathy 10/05/2017  . Non-insulin dependent type 2 diabetes mellitus (HCC)   . 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 STEMI involving left anterior descending coronary artery (HCC) 10/05/2017    Past Surgical History:  Procedure Laterality Date  . CORONARY/GRAFT ACUTE MI REVASCULARIZATION N/A 10/05/2017   Procedure: Coronary/Graft Acute MI Revascularization;  Surgeon: Lennette Bihari, MD;  Location: [facility] INVASIVE CV LAB;  Service: Cardiovascular;  Laterality: N/A;  . LEFT HEART CATH AND CORONARY ANGIOGRAPHY N/A 10/05/2017   Procedure: LEFT HEART CATH AND CORONARY ANGIOGRAPHY;  Surgeon: Lennette Bihari, MD;  Location: [facility]  INVASIVE CV LAB;  Service: Cardiovascular;  Laterality: N/A;    Prior to Admission medications   Medication Sig Start Date End Date Taking? Authorizing Provider  aspirin 81 MG chewable tablet Chew 1 tablet (81 mg total) by mouth daily. 10/25/17  Yes Georgie Chard D, NP  atorvastatin (LIPITOR) 80 MG tablet Take 1 tablet (80 mg total) by mouth daily. 10/24/17  Yes Georgie Chard D, NP  carvedilol (COREG) 3.125 MG tablet Take 3.125 mg by mouth 2 (two) times daily with a meal.   Yes [provider]  colchicine 0.6 MG tablet Take 1 tablet (0.6 mg total) by mouth daily for 13 days. 10/25/17 11/07/17 Yes Georgie Chard D, NP  furosemide (LASIX) 20 MG tablet Take 1 tablet (20 mg total) by mouth daily. 10/30/17  Yes Rolland Porter, MD  glimepiride (AMARYL) 2 MG tablet Take 1 tablet (2 mg total) by mouth daily with breakfast. 10/25/17  Yes Lennette Bihari, MD  isosorbide mononitrate (IMDUR) 30 MG 24 hr tablet Take 0.5 tablets (15 mg total) by mouth daily. 10/25/17  Yes Georgie Chard D, NP  loratadine (CLARITIN) 10 MG tablet Take 1 tablet (10 mg total) by mouth daily. 10/25/17  Yes Georgie Chard D, NP  nitroGLYCERIN (NITROSTAT) 0.4 MG SL tablet Place 1 tablet (0.4 mg total) under the tongue every 5 (five) minutes x 3 doses as needed for chest pain. 10/24/17  Yes Georgie Chard D, NP  pantoprazole (PROTONIX) 40 MG tablet Take 1 tablet (40 mg total) by mouth 2 (two) times daily. 10/24/17  Yes Georgie Chard D, NP  potassium chloride (K-DUR) 10 MEQ tablet Take 1 tablet (10 mEq total) by mouth 2 (two) times daily. 10/30/17  Yes Rolland Porter, MD  ticagrelor (BRILINTA) 90 MG TABS tablet Take 1 tablet (90 mg total) by mouth 2 (two) times daily. 10/24/17  Yes Georgie Chard D, NP  carvedilol (COREG) 6.25 MG tablet Take 1 tablet (6.25 mg total) by mouth 2 (two) times daily with a meal. 10/28/17   Barrett, Joline Salt, PA-C  chlorpheniramine-HYDROcodone (TUSSIONEX PENNKINETIC ER) 10-8 MG/5ML SUER Take 5 mLs by mouth at bedtime as  needed for cough. 10/30/17   Rolland Porter, MD  linagliptin (TRADJENTA) 5 MG TABS tablet Take 1 tablet (5 mg total) by mouth daily. 10/24/17   Georgie Chard D, NP    Scheduled Meds: . aspirin  81 mg Oral Daily  . atorvastatin  80 mg Oral q1800  . carvedilol  3.125 mg Oral BID WC  . furosemide  20 mg Intravenous Q12H  . heparin  5,000 Units Subcutaneous Q8H  . insulin aspart  0-5 Units Subcutaneous QHS  . insulin aspart  0-9 Units Subcutaneous TID WC  . isosorbide mononitrate  15 mg Oral Daily  .  pantoprazole  40 mg Oral BID  . potassium chloride  10 mEq Oral BID  . sucralfate  1 g Oral TID WC & HS  . ticagrelor  90 mg Oral BID   Infusions: . ceFEPime (MAXIPIME) IV Stopped (11/01/17 0049)   PRN Meds: acetaminophen, diphenhydrAMINE, guaiFENesin-dextromethorphan, nitroGLYCERIN, ondansetron (ZOFRAN) IV   Allergies as of 10/31/2017  . (No Known Allergies)    History reviewed. No pertinent family history.  Social History   Socioeconomic History  . Marital status: Widowed    Spouse name: Not on file  . Number of children: Not on file  . Years of education: Not on file  . Highest education level: Not on file  Occupational History  . Not on file  Social Needs  . Financial resource strain: Not on file  . Food insecurity:    Worry: Not on file    Inability: Not on file  . Transportation needs:    Medical: Not on file    Non-medical: Not on file  Tobacco Use  . Smoking status: Never Smoker  . Smokeless tobacco: Never Used  Substance and Sexual Activity  . Alcohol use: Not Currently  . Drug use: Never  . Sexual activity: Not Currently  Lifestyle  . Physical activity:    Days per week: Not on file    Minutes per session: Not on file  . Stress: Not on file  Relationships  . Social connections:    Talks on phone: Not on file    Gets together: Not on file    Attends religious service: Not on file    Active member of club or organization: Not on file    Attends meetings  of clubs or organizations: Not on file    Relationship status: Not on file  . Intimate partner violence:    Fear of current or ex partner: Not on file    Emotionally abused: Not on file    Physically abused: Not on file    Forced sexual activity: Not on file  Other Topics Concern  . Not on file  Social History Narrative  . Not on file    REVIEW OF SYSTEMS: Constitutional: Feels kind of tired. ENT:  No nose bleeds Pulm: Dry cough. CV:  No palpitations, no LE edema.  No chest pain. GU:  No hematuria, no frequency GI:  Per HPI Heme: No unusual bleeding or bruising. Transfusions: None. Neuro:  No headaches, no peripheral tingling or numbness Derm:  No itching, no rash or sores.  Endocrine:  No sweats or chills.  No polyuria or dysuria Immunization: Not required. Travel:  None beyond local counties in last few months.    PHYSICAL EXAM: Vital signs in last 24 hours: Vitals:   11/01/17 0434 11/01/17 0902  BP: 139/78 125/71  Pulse: 91 84  Resp: 19   Temp: 97.7 F (36.5 C)   SpO2: 99%    Wt Readings from Last 3 Encounters:  11/01/17 109 lb 14.4 oz (49.9 kg)  10/28/17 114 lb 12.8 oz (52.1 kg)  10/24/17 111 lb 6.4 oz (50.5 kg)    General: Frail, alert, talkative, non-ill-appearing elderly Hispanic woman. Head: No facial asymmetry or swelling.  No signs of head trauma. Eyes: No scleral icterus.  No conjunctival pallor.  EOMI. Ears: Somewhat HOH. Nose: No discharge or congestion. Mouth: Tongue midline.  Oral mucosa moist and clear. Neck: No JVD, no masses, no thyromegaly. Lungs: Clear bilaterally.  No labored breathing or cough. Heart: RRR.  Normal S1,  S2.  No MRG. Abdomen: Soft.  Not distended.  Active bowel sounds.  Minimal if any tenderness in the upper abdomen (epigastrium and bilateral upper quadrants)..   Rectal: Deferred Musc/Skeltl: No joint swelling or redness. Extremities: No CCE. Neurologic: Oriented x3.  Alert.  Fluid speech though a bit rambling.  Moves all  4 limbs.  No tremor or obvious deficits. Skin: No jaundice, no sores, no rashes, no significant purpura or bruising. Tattoos: None seen Nodes: No cervical or inguinal adenopathy. Psych: Somewhat anxious.  Fluid speech.  Intake/Output from previous day: 05/06 0701 - 05/07 0700 In: 100 [IV Piggyback:100] Out: 250 [Urine:250] Intake/Output this shift: Total I/O In: 240 [P.O.:240] Out: 350 [Urine:350]  LAB RESULTS: Recent Labs    10/30/17 0415 10/31/17 1429 10/31/17 1458 11/01/17 0413  WBC 6.1 4.8  --  4.8  HGB 8.5* 8.5* 8.8* 9.3*  HCT 26.5* 26.0* 26.0* 28.7*  PLT 361 385  --  410*   BMET Lab Results  Component Value Date   NA 134 (L) 11/01/2017   NA 128 (L) 10/31/2017   NA 128 (L) 10/31/2017   K 3.7 11/01/2017   K 4.1 10/31/2017   K 4.1 10/31/2017   CL 99 (L) 11/01/2017   CL 95 (L) 10/31/2017   CL 96 (L) 10/31/2017   CO2 22 11/01/2017   CO2 22 10/31/2017   CO2 20 (L) 10/30/2017   GLUCOSE 126 (H) 11/01/2017   GLUCOSE 222 (H) 10/31/2017   GLUCOSE 225 (H) 10/31/2017   BUN 24 (H) 11/01/2017   BUN 25 (H) 10/31/2017   BUN 27 (H) 10/31/2017   CREATININE 1.74 (H) 11/01/2017   CREATININE 1.60 (H) 10/31/2017   CREATININE 1.74 (H) 10/31/2017   CALCIUM 8.6 (L) 11/01/2017   CALCIUM 8.2 (L) 10/31/2017   CALCIUM 8.5 (L) 10/30/2017   LFT Recent Labs    10/31/17 1429  PROT 6.0*  ALBUMIN 2.8*  AST 20  ALT 15  ALKPHOS 73  BILITOT 0.4   PT/INR Lab Results  Component Value Date   INR 1.02 10/05/2017   Hepatitis Panel No results for input(s): HEPBSAG, HCVAB, HEPAIGM, HEPBIGM in the last 72 hours. C-Diff No components found for: CDIFF Lipase     Component Value Date/Time   LIPASE 51 10/31/2017 2152    Drugs of Abuse     Component Value Date/Time   LABOPIA NONE DETECTED 10/31/2017 1920   COCAINSCRNUR NONE DETECTED 10/31/2017 1920   LABBENZ POSITIVE (A) 10/31/2017 1920   AMPHETMU NONE DETECTED 10/31/2017 1920   THCU NONE DETECTED 10/31/2017 1920   LABBARB  NONE DETECTED 10/31/2017 1920     RADIOLOGY STUDIES: Dg Chest 2 View  Result Date: 10/31/2017 CLINICAL DATA:  Epigastric pain radiating into the chest since yesterday. Recent MI with coronary stent placement. CHF. Nonsmoker. EXAM: CHEST - 2 VIEW COMPARISON:  PA and lateral chest x-ray of Oct 30, 2017 FINDINGS: The lungs are well-expanded. The interstitial markings remain increased. Patchy densities are present at both lung bases. Small amounts of pleural fluid are noted on the right. The cardiac silhouette is mildly enlarged. There is calcification in the wall of the aortic arch. The trachea is midline. The bony thorax exhibits no acute abnormality. IMPRESSION: CHF with mild interstitial edema. Bibasilar atelectasis. Small right and trace left pleural effusions. Thoracic aortic atherosclerosis. Electronically Signed   By: David  [place] M.D.   On: 10/31/2017 16:21   Ct Head Wo Contrast  Result Date: 10/31/2017 CLINICAL DATA:  77 year old presenting with acute onset  of occipital headache, nausea and vomiting, and acute mental status changes. EXAM: CT HEAD WITHOUT CONTRAST TECHNIQUE: Contiguous axial images were obtained from the base of the skull through the vertex without intravenous contrast. COMPARISON:  None. FINDINGS: Brain: Moderate cortical, deep and cerebellar atrophy. Remote strokes involving both cerebellar hemispheres. Mild changes of small vessel disease of the white matter diffusely. No mass lesion. No midline shift. No acute hemorrhage or hematoma. No extra-axial fluid collections. No evidence of acute infarction. Vascular: Moderate BILATERAL carotid siphon and vertebral artery atherosclerosis. No hyperdense vessel. Skull: No skull fracture or other focal osseous abnormality involving the skull. Sinuses/Orbits: Visualized paranasal sinuses, bilateral mastoid air cells and bilateral middle ear cavities well-aerated. Bony nasal septal deviation to the LEFT. Visualized orbits and globes normal in  appearance. Other: None. IMPRESSION: 1. No acute intracranial abnormality. 2. Remote strokes involving both cerebellar hemispheres. 3. Moderate generalized atrophy and mild chronic microvascular ischemic changes of the white matter. Electronically Signed   By: Hulan Saas M.D.   On: 10/31/2017 16:20      IMPRESSION:   *   Recurrent epigastric pain.  Suspected peptic ulcer disease during work-up 3 weeks ago.  Had not undergone EGD because of acute MI during that admission. carafate now added to BID PPI.  Interesting that Valium provides best relief.    *   Acute MI.  Status post angioplasty and placement of DES to LAD 10/05/2017.  On Brilinta since then.  *   CHF.  Acute.  On lasix. Cardiac enzymes close to normal.    *   Probable UTI.   On Cefepime.     PLAN:     *   Because of recent MI and stenting, Dr. Lavon Paganini prefers to pursue upper GI series rather than EGD.  Ordered this for today.    *  Add dicyclomine.     Jennye Moccasin  11/01/2017, 12:40 PM Phone [phone]   Attending physician's note   I have taken a history, examined the patient and reviewed the chart. I agree with the Advanced Practitioner's note, impression and recommendations. Patient doesn't speak english, according to daughter epigastric abd discomfort is more like pressure and is smiliar to she had when she had heart attack. She also thinks Lasix is causing the pain?? As she was fine when she stopped taking lasix for few days in between Epigastric discomfort/dyspepsia could be related to CHF exacerbation and possible gastroparesis Continue PPI Low residue and low fat diet Small frequent meals Follow up upper GI series to exclude peptic ulcer disease or any large neoplastic lesion Patient is at high risk with invasive procedures and anesthesia given recent MI and coronary intervention less than 4 weeks ago. Will defer EGD HIDA scan negative for acute cholecystitis   K. Scherry Ran , MD [phone]

## 2017-11-02 ENCOUNTER — Ambulatory Visit: Payer: Self-pay | Admitting: Cardiovascular Disease

## 2017-11-02 DIAGNOSIS — N289 Disorder of kidney and ureter, unspecified: Secondary | ICD-10-CM

## 2017-11-02 DIAGNOSIS — E871 Hypo-osmolality and hyponatremia: Secondary | ICD-10-CM

## 2017-11-02 DIAGNOSIS — I25118 Atherosclerotic heart disease of native coronary artery with other forms of angina pectoris: Secondary | ICD-10-CM

## 2017-11-02 DIAGNOSIS — R1013 Epigastric pain: Secondary | ICD-10-CM

## 2017-11-02 DIAGNOSIS — I5023 Acute on chronic systolic (congestive) heart failure: Secondary | ICD-10-CM

## 2017-11-02 DIAGNOSIS — N3000 Acute cystitis without hematuria: Secondary | ICD-10-CM

## 2017-11-02 LAB — BASIC METABOLIC PANEL
ANION GAP: 12 (ref 5–15)
BUN: 21 mg/dL — AB (ref 6–20)
CHLORIDE: 100 mmol/L — AB (ref 101–111)
CO2: 23 mmol/L (ref 22–32)
Calcium: 8.5 mg/dL — ABNORMAL LOW (ref 8.9–10.3)
Creatinine, Ser: 1.87 mg/dL — ABNORMAL HIGH (ref 0.44–1.00)
GFR calc Af Amer: 28 mL/min — ABNORMAL LOW (ref >60)
GFR, EST NON AFRICAN AMERICAN: 24 mL/min — AB (ref >60)
Glucose, Bld: 141 mg/dL — ABNORMAL HIGH (ref 65–99)
Potassium: 3.5 mmol/L (ref 3.5–5.1)
SODIUM: 135 mmol/L (ref 135–145)

## 2017-11-02 LAB — GLUCOSE, CAPILLARY
GLUCOSE-CAPILLARY: 138 mg/dL — AB (ref 65–99)
Glucose-Capillary: 164 mg/dL — ABNORMAL HIGH (ref 65–99)
Glucose-Capillary: 184 mg/dL — ABNORMAL HIGH (ref 65–99)
Glucose-Capillary: 137 mg/dL — ABNORMAL HIGH (ref 65–99)

## 2017-11-02 LAB — CBC
HCT: 27.5 % — ABNORMAL LOW (ref 36.0–46.0)
HEMOGLOBIN: 9.1 g/dL — AB (ref 12.0–15.0)
MCH: 26.8 pg (ref 26.0–34.0)
MCHC: 33.1 g/dL (ref 30.0–36.0)
MCV: 81.1 fL (ref 78.0–100.0)
PLATELETS: 394 10*3/uL (ref 150–400)
RBC: 3.39 MIL/uL — AB (ref 3.87–5.11)
RDW: 14.1 % (ref 11.5–15.5)
WBC: 5.5 10*3/uL (ref 4.0–10.5)

## 2017-11-02 LAB — PHOSPHORUS: Phosphorus: 3 mg/dL (ref 2.5–4.6)

## 2017-11-02 LAB — MAGNESIUM: Magnesium: 1.4 mg/dL — ABNORMAL LOW (ref 1.7–2.4)

## 2017-11-02 MED ORDER — LORATADINE 10 MG PO TABS
10.0000 mg | ORAL_TABLET | Freq: Every day | ORAL | Status: DC
  Administered 2017-11-02 – 2017-11-03 (×2): 10 mg via ORAL
  Filled 2017-11-02 (×2): qty 1

## 2017-11-02 MED ORDER — MIRTAZAPINE 15 MG PO TBDP
15.0000 mg | ORAL_TABLET | Freq: Every day | ORAL | Status: DC
  Administered 2017-11-02: 15 mg via ORAL
  Filled 2017-11-02: qty 1

## 2017-11-02 MED ORDER — MAGNESIUM SULFATE 2 GM/50ML IV SOLN
2.0000 g | Freq: Once | INTRAVENOUS | Status: AC
  Administered 2017-11-02: 2 g via INTRAVENOUS
  Filled 2017-11-02: qty 50

## 2017-11-02 NOTE — Care Management Note (Signed)
Case Management Note  Patient Details  Name: Sheila White MRN: [id number] Date of Birth: 11-Jan-1941  Subjective/Objective:  Pt presented for Acute on Chronic CHF. Pt is from home with daughter. Previous admission and pt was set up with [facility] for RN, PT, SW. Pt will need resumption orders once stable to transition home. [facility] and per son pt gets medications without any problems.                  Action/Plan: [facility] is aware that pt is hospitalized. No further needs from CM at this time.   Expected Discharge Date:                  Expected Discharge Plan:  Home w Home Health Services  In-House Referral:  NA  Discharge planning Services  CM Consult  Post Acute Care Choice:  Home Health, Resumption of Svcs/PTA Provider Choice offered to:  Patient, Adult Children  DME Arranged:  N/A DME Agency:  NA  HH Arranged:  RN, Disease Management, PT, Social Work [employer] Agency:  [facility]  Status of Service:  Completed, signed off  If discussed at [employer], dates discussed:    Additional Comments:  Gala Lewandowsky, RN 11/02/2017, 11:14 AM

## 2017-11-02 NOTE — Progress Notes (Addendum)
Daily Rounding Note  11/02/2017, 8:16 AM  LOS: 1 day   SUBJECTIVE:   Chief complaint: Nonproductive, dry cough.  Bothered her all night long.  Last night she was complaining of feeling hot but was not diaphoretic.  This morning she complains of feeling cold.  Epigastric/upper abdominal pain much improved, minimal at present. Brown stool at about 4 AM this morning.   OBJECTIVE:         Vital signs in last 24 hours:    Temp:  [97.7 F (36.5 C)-98.4 F (36.9 C)] 98.4 F (36.9 C) (05/08 0449) Pulse Rate:  [69-95] 95 (05/08 0449) Resp:  [19-22] 22 (05/08 0449) BP: (99-136)/(63-78) 136/78 (05/08 0449) SpO2:  [97 %-99 %] 97 % (05/08 0449) Weight:  [108 lb 6.4 oz (49.2 kg)] 108 lb 6.4 oz (49.2 kg) (05/08 0449) Last BM Date: 11/01/17 Filed Weights   10/31/17 1438 11/01/17 0434 11/02/17 0449  Weight: 114 lb (51.7 kg) 109 lb 14.4 oz (49.9 kg) 108 lb 6.4 oz (49.2 kg)   General: Does not look acutely ill.  She is alert and coughing Heart: RRR. Chest: Dry cough. Abdomen: Soft, active bowel sounds.  Non-distended.  Slight tenderness in the upper abdomen, nonfocal. Extremities: No CCE. Neuro/Psych: Alert.  Appropriate.  Follows commands.  Intake/Output from previous day: 05/07 0701 - 05/08 0700 In: 360 [P.O.:360] Out: 700 [Urine:700]  Intake/Output this shift: No intake/output data recorded.  Lab Results: Recent Labs    10/31/17 1429 10/31/17 1458 11/01/17 0413  WBC 4.8  --  4.8  HGB 8.5* 8.8* 9.3*  HCT 26.0* 26.0* 28.7*  PLT 385  --  410*   BMET Recent Labs    10/31/17 1429 10/31/17 1458 11/01/17 0413  NA 128* 128* 134*  K 4.1 4.1 3.7  CL 96* 95* 99*  CO2 22  --  22  GLUCOSE 225* 222* 126*  BUN 27* 25* 24*  CREATININE 1.74* 1.60* 1.74*  CALCIUM 8.2*  --  8.6*   LFT Recent Labs    10/31/17 1429  PROT 6.0*  ALBUMIN 2.8*  AST 20  ALT 15  ALKPHOS 73  BILITOT 0.4   PT/INR No results for input(s):  LABPROT, INR in the last 72 hours. Hepatitis Panel No results for input(s): HEPBSAG, HCVAB, HEPAIGM, HEPBIGM in the last 72 hours.  Studies/Results: Dg Chest 2 View  Result Date: 10/31/2017 CLINICAL DATA:  Epigastric pain radiating into the chest since yesterday. Recent MI with coronary stent placement. CHF. Nonsmoker. EXAM: CHEST - 2 VIEW COMPARISON:  PA and lateral chest x-ray of Oct 30, 2017 FINDINGS: The lungs are well-expanded. The interstitial markings remain increased. Patchy densities are present at both lung bases. Small amounts of pleural fluid are noted on the right. The cardiac silhouette is mildly enlarged. There is calcification in the wall of the aortic arch. The trachea is midline. The bony thorax exhibits no acute abnormality. IMPRESSION: CHF with mild interstitial edema. Bibasilar atelectasis. Small right and trace left pleural effusions. Thoracic aortic atherosclerosis. Electronically Signed   By: David  [place] M.D.   On: 10/31/2017 16:21   Ct Head Wo Contrast  Result Date: 10/31/2017 CLINICAL DATA:  77 year old presenting with acute onset of occipital headache, nausea and vomiting, and acute mental status changes. EXAM: CT HEAD WITHOUT CONTRAST TECHNIQUE: Contiguous axial images were obtained from the base of the skull through the vertex without intravenous contrast. COMPARISON:  None. FINDINGS: Brain: Moderate cortical, deep and cerebellar atrophy.  Remote strokes involving both cerebellar hemispheres. Mild changes of small vessel disease of the white matter diffusely. No mass lesion. No midline shift. No acute hemorrhage or hematoma. No extra-axial fluid collections. No evidence of acute infarction. Vascular: Moderate BILATERAL carotid siphon and vertebral artery atherosclerosis. No hyperdense vessel. Skull: No skull fracture or other focal osseous abnormality involving the skull. Sinuses/Orbits: Visualized paranasal sinuses, bilateral mastoid air cells and bilateral middle ear cavities  well-aerated. Bony nasal septal deviation to the LEFT. Visualized orbits and globes normal in appearance. Other: None. IMPRESSION: 1. No acute intracranial abnormality. 2. Remote strokes involving both cerebellar hemispheres. 3. Moderate generalized atrophy and mild chronic microvascular ischemic changes of the white matter. Electronically Signed   By: Hulan Saas M.D.   On: 10/31/2017 16:20   Dg Ugi W/high Density W/kub  Result Date: 11/01/2017 CLINICAL DATA:  Abdominal pain concerning for peptic ulcer disease.Recent myocardial infarction with stenting that precludes EGD. EXAM: UPPER GI SERIES WITH KUB TECHNIQUE: After obtaining a scout radiograph a routine upper GI series was performed using thin and high density barium. FLUOROSCOPY TIME:  Fluoroscopy Time:  1 minutes 24 seconds Radiation Exposure Index (if provided by the fluoroscopic device): 13.6 Number of Acquired Spot Images: 6 COMPARISON:  Abdominal CT 10/12/2017 FINDINGS: Lateral pharyngeal imaging is negative for aspiration or diverticulum. No obstructive process. The esophagus has smooth mucosal contour and normal distensibility. Normal motility in the upright position. The stomach has normal shape, distensibility, and location. There is no ulcer, mass or fold thickening. No gastric outlet obstruction. Normal course of the duodenum with normal fold pattern. IMPRESSION: Normal upper GI.  No findings of gastritis or ulcer. Electronically Signed   By: Marnee Spring M.D.   On: 11/01/2017 16:06   Scheduled Meds: . aspirin  81 mg Oral Daily  . atorvastatin  80 mg Oral q1800  . carvedilol  3.125 mg Oral BID WC  . dicyclomine  10 mg Oral TID AC  . furosemide  20 mg Intravenous Q12H  . heparin  5,000 Units Subcutaneous Q8H  . insulin aspart  0-5 Units Subcutaneous QHS  . insulin aspart  0-9 Units Subcutaneous TID WC  . isosorbide mononitrate  15 mg Oral Daily  . pantoprazole  40 mg Oral BID  . potassium chloride  10 mEq Oral BID  .  sucralfate  1 g Oral TID WC & HS  . ticagrelor  90 mg Oral BID   Continuous Infusions: . ceFEPime (MAXIPIME) IV Stopped (11/02/17 0143)   PRN Meds:.acetaminophen, diphenhydrAMINE, gi cocktail, guaiFENesin-dextromethorphan, nitroGLYCERIN, ondansetron (ZOFRAN) IV   ASSESMENT:   *  Epigastric pain. Sucralfate and dicyclomine added to BID PPI.    Biliary sludge, normal LFTs.  Incomplete HIDA: patent cystic duct, no acute cholecystitis, unable to assess CBD patency.  Normal UGI.    *  Acute MI, s/p DES/angioplasty to LAD 10/05/17.  On 81 ASA, Brilinta since then.   *  Post MI pericarditis.  RX Colchicine.    *   CHF.    *  UTI suspected.  clx pending.  On Maxipime.       *   IDDM.     PLAN   *   Continue twice daily PPI, Carafate, TID dicyclomine.Sheila White  11/02/2017, 8:16 AM Phone [phone]   Attending physician's note   I have taken an interval history, reviewed the chart and examined the patient. I agree with the Advanced Practitioner's note, impression and recommendations.  Epigastric abdominal pain has improved. Continue PPI twice daily and dicyclomine as needed Small frequent meals with low fiber and low-fat diet Upper GI series was normal exam with no evidence of peptic ulcer disease or neoplastic lesions Will sign off, please call with any questions  Kirtland Bouchard Scherry Ran , MD [phone]

## 2017-11-02 NOTE — Progress Notes (Signed)
Patient has slept throughout the night, has not complained of abdominal pain.  She states that medication was helping her controlling the pain.  Daughter was at her beside half the night and son came in for the other half.  At this time, patient is asleep, will keep monitoring the patient.  Has been going to the bathroom with family's help.  Daughter stated she is trying to use walker at home for mobility with some success.

## 2017-11-02 NOTE — Progress Notes (Addendum)
Progress Note  Patient Name: Sheila White Date of Encounter: 11/02/2017  Primary Cardiologist: Nicki Guadalajara, MD  Subjective   No chest pain, or abd pain this morning. C/o dry cough.   Inpatient Medications    Scheduled Meds: . aspirin  81 mg Oral Daily  . atorvastatin  80 mg Oral q1800  . carvedilol  3.125 mg Oral BID WC  . dicyclomine  10 mg Oral TID AC  . furosemide  20 mg Intravenous Q12H  . heparin  5,000 Units Subcutaneous Q8H  . insulin aspart  0-5 Units Subcutaneous QHS  . insulin aspart  0-9 Units Subcutaneous TID WC  . isosorbide mononitrate  15 mg Oral Daily  . loratadine  10 mg Oral Daily  . mirtazapine  15 mg Oral QHS  . pantoprazole  40 mg Oral BID  . potassium chloride  10 mEq Oral BID  . sucralfate  1 g Oral TID WC & HS  . ticagrelor  90 mg Oral BID   Continuous Infusions: . ceFEPime (MAXIPIME) IV Stopped (11/02/17 0143)   PRN Meds: acetaminophen, gi cocktail, guaiFENesin-dextromethorphan, nitroGLYCERIN, ondansetron (ZOFRAN) IV   Vital Signs    Vitals:   11/01/17 1800 11/01/17 1955 11/02/17 0449 11/02/17 0819  BP: 99/65 112/63 136/78 115/67  Pulse: 69 79 95 85  Resp:  19 (!) 22   Temp:  98.2 F (36.8 C) 98.4 F (36.9 C)   TempSrc:  Oral Oral   SpO2:  99% 97%   Weight:   108 lb 6.4 oz (49.2 kg)   Height:        Intake/Output Summary (Last 24 hours) at 11/02/2017 1103 Last data filed at 11/02/2017 0559 Gross per 24 hour  Intake 120 ml  Output 350 ml  Net -230 ml   Filed Weights   10/31/17 1438 11/01/17 0434 11/02/17 0449  Weight: 114 lb (51.7 kg) 109 lb 14.4 oz (49.9 kg) 108 lb 6.4 oz (49.2 kg)    Telemetry    SR - Personally Reviewed  ECG    N/a - Personally Reviewed  Physical Exam   General: Thin, frail older hispanic female appearing in no acute distress. Head: Normocephalic, atraumatic.  Neck: Supple without bruits, JVD. Lungs:  Resp regular and unlabored, CTA. Heart: RRR, S1, S2, no S3, S4, or murmur; no rub. Abdomen:  Soft, non-tender, non-distended with normoactive bowel sounds.  Extremities: No clubbing, cyanosis, edema. Distal pedal pulses are 2+ bilaterally. Neuro: Alert and oriented X 3. Moves all extremities spontaneously. Psych: Normal affect.  Labs    Chemistry Recent Labs  Lab 10/31/17 1429 10/31/17 1458 11/01/17 0413 11/02/17 0930  NA 128* 128* 134* 135  K 4.1 4.1 3.7 3.5  CL 96* 95* 99* 100*  CO2 22  --  22 23  GLUCOSE 225* 222* 126* 141*  BUN 27* 25* 24* 21*  CREATININE 1.74* 1.60* 1.74* 1.87*  CALCIUM 8.2*  --  8.6* 8.5*  PROT 6.0*  --   --   --   ALBUMIN 2.8*  --   --   --   AST 20  --   --   --   ALT 15  --   --   --   ALKPHOS 73  --   --   --   BILITOT 0.4  --   --   --   GFRNONAA 27*  --  27* 24*  GFRAA 31*  --  31* 28*  ANIONGAP 10  --  13 12  Hematology Recent Labs  Lab 10/31/17 1429 10/31/17 1458 11/01/17 0413 11/02/17 0930  WBC 4.8  --  4.8 5.5  RBC 3.22*  --  3.55*  3.55* 3.39*  HGB 8.5* 8.8* 9.3* 9.1*  HCT 26.0* 26.0* 28.7* 27.5*  MCV 80.7  --  80.8 81.1  MCH 26.4  --  26.2 26.8  MCHC 32.7  --  32.4 33.1  RDW 13.6  --  13.6 14.1  PLT 385  --  410* 394    Cardiac Enzymes Recent Labs  Lab 10/31/17 2152 11/01/17 0413 11/01/17 0950  TROPONINI 0.05* 0.05* 0.05*    Recent Labs  Lab 10/30/17 0422 10/31/17 1457  TROPIPOC 0.04 0.03     BNP Recent Labs  Lab 10/30/17 0415 10/31/17 1431  BNP 1,990.1* 1,648.0*     DDimer No results for input(s): DDIMER in the last 168 hours.    Radiology    Dg Chest 2 View  Result Date: 10/31/2017 CLINICAL DATA:  Epigastric pain radiating into the chest since yesterday. Recent MI with coronary stent placement. CHF. Nonsmoker. EXAM: CHEST - 2 VIEW COMPARISON:  PA and lateral chest x-ray of Oct 30, 2017 FINDINGS: The lungs are well-expanded. The interstitial markings remain increased. Patchy densities are present at both lung bases. Small amounts of pleural fluid are noted on the right. The cardiac  silhouette is mildly enlarged. There is calcification in the wall of the aortic arch. The trachea is midline. The bony thorax exhibits no acute abnormality. IMPRESSION: CHF with mild interstitial edema. Bibasilar atelectasis. Small right and trace left pleural effusions. Thoracic aortic atherosclerosis. Electronically Signed   By: David  [place] M.D.   On: 10/31/2017 16:21   Ct Head Wo Contrast  Result Date: 10/31/2017 CLINICAL DATA:  77 year old presenting with acute onset of occipital headache, nausea and vomiting, and acute mental status changes. EXAM: CT HEAD WITHOUT CONTRAST TECHNIQUE: Contiguous axial images were obtained from the base of the skull through the vertex without intravenous contrast. COMPARISON:  None. FINDINGS: Brain: Moderate cortical, deep and cerebellar atrophy. Remote strokes involving both cerebellar hemispheres. Mild changes of small vessel disease of the white matter diffusely. No mass lesion. No midline shift. No acute hemorrhage or hematoma. No extra-axial fluid collections. No evidence of acute infarction. Vascular: Moderate BILATERAL carotid siphon and vertebral artery atherosclerosis. No hyperdense vessel. Skull: No skull fracture or other focal osseous abnormality involving the skull. Sinuses/Orbits: Visualized paranasal sinuses, bilateral mastoid air cells and bilateral middle ear cavities well-aerated. Bony nasal septal deviation to the LEFT. Visualized orbits and globes normal in appearance. Other: None. IMPRESSION: 1. No acute intracranial abnormality. 2. Remote strokes involving both cerebellar hemispheres. 3. Moderate generalized atrophy and mild chronic microvascular ischemic changes of the white matter. Electronically Signed   By: Hulan Saas M.D.   On: 10/31/2017 16:20   Dg Ugi W/high Density W/kub  Result Date: 11/01/2017 CLINICAL DATA:  Abdominal pain concerning for peptic ulcer disease.Recent myocardial infarction with stenting that precludes EGD. EXAM: UPPER GI  SERIES WITH KUB TECHNIQUE: After obtaining a scout radiograph a routine upper GI series was performed using thin and high density barium. FLUOROSCOPY TIME:  Fluoroscopy Time:  1 minutes 24 seconds Radiation Exposure Index (if provided by the fluoroscopic device): 13.6 Number of Acquired Spot Images: 6 COMPARISON:  Abdominal CT 10/12/2017 FINDINGS: Lateral pharyngeal imaging is negative for aspiration or diverticulum. No obstructive process. The esophagus has smooth mucosal contour and normal distensibility. Normal motility in the upright position. The stomach has normal  shape, distensibility, and location. There is no ulcer, mass or fold thickening. No gastric outlet obstruction. Normal course of the duodenum with normal fold pattern. IMPRESSION: Normal upper GI.  No findings of gastritis or ulcer. Electronically Signed   By: Marnee Spring M.D.   On: 11/01/2017 16:06    Cardiac Studies   N/a  Patient Profile     77 y.o. female with a hx of NIDDM, HOH, admit 10/05/2017 w/ STEMI, late presentation s/p DES LA, acuteS-D-CHF w/ EF 30% w/ grade 2 dd, AKI,hyponatremia, abd pain, anemia, weakness, EF 30% with grade 2 diastolic, postinfarction pericarditis, d/c on colchicine. Presented back with chest pain and SOB.   Assessment & Plan    1. Acute on Chronic Systolic HF: No reported shortness of breath this morning. Weight is stable. Would plan to transition to oral dosing in the am.   2. CAD: s/p DES to LAD on 10/05/17. Remains on DAPT with ASA/Brilinta. No chest pain today. Remains on medical therapy with BB, statin, Imdur.   3. ICM: EF 30% on echo. No room for ACE/ARB 2/2 to renal function. Weight is stable. Breathing improved.   4. Abnormal CXR: showed PNA and placed on Cefipime per admitting  5. Abd pain: no reports of pain this morning. GI following.    Signed, Laverda Page, NP  11/02/2017, 11:03 AM  Pager # [phone]    I have examined the patient and reviewed assessment and plan and  discussed with patient.  Agree with above as stated.  When I saw the patient, she mentioned a burning feeling in her chest coming from her stomach to her upper chest.  Continue to treat for volume overload.  Her troponin pattern is not consistent with ACS.  No further cardiac w/u is planned.  GI also following.    Lance Muss  For questions or updates, please contact [facility] Please consult [email or web address] for contact info under Cardiology/STEMI.

## 2017-11-02 NOTE — Progress Notes (Signed)
Patient c/o blurred vision and some hot flashes.  Checked pupils, not showing no dilation when light was shown.  No other complain at this time.  I will keep monitoring patient.

## 2017-11-02 NOTE — Progress Notes (Signed)
PROGRESS NOTE    Sheila White  ZOX:[id number] DOB: [birth year] DOA: 10/31/2017 PCP: Julieanne Manson, MD   Outpatient Specialists:     Brief Narrative:  Sheila White is a 77 y.o. female with medical history significant for type 2 diabetes mellitus, coronary artery disease,  STEMI last month treated with DES to LAD, and systolic CHF, now presenting to the emergency department for evaluation of abdominal pain, shortness of breath, nonproductive cough, and fatigue.  Patient was discharged from the hospital on 10/24/2017 after management of STEMI.  She developed systolic CHF with EF 30% but diuresis was complicated by hyponatremia and acute kidney injury.  Since returning home, she has had progressive dyspnea with orthopnea, nonproductive cough, and fatigue.  She denies fevers or chills.  She has also been experiencing abdominal pain with mild nausea but no vomiting or diarrhea.  She had recently undergone extensive inpatient work-up for the abdominal pain which was negative, and she was started on twice daily Protonix for suspected PUD.      Assessment & Plan:   Principal Problem:   Acute on chronic systolic congestive heart failure (HCC) Active Problems:   Abdominal pain   Hyponatremia   Renal insufficiency   Normocytic anemia   Coronary artery disease   Chest pain   Acute lower UTI  Epigastric/stomach pain -GI consult appreciated -normal upper GI series -PPI BID and carafate -symptoms improved or not depending on who asks -may need gastric emptying study and reglan  FTT -patient says she does not have an appetite -remeron added  Acute systolic CHF  - lasix IV -cardiology plans to change to PO in the AM  Hyponatremia  - Serum sodium is 128 on admission, similar to recent priors  - fluid restrict -normal with last lab draw  Hypomagnesemia -replace IV  Renal insufficiency  - SCr is not normal, but down from 3.86 last month  - monitor closely -? What her actual  baseline is  CAD - Presents with abdominal pain, reports this to be different than angina she had last month -- but discomfort was fixed by nitro - Continue DAPT, continue beta-blocker, statin, nitrates; ACE/ARB precluded by renal insufficiency     UTI  - UA suggestive of infection but was contaminated -culture growing Klebsiella - Continue empiric abx   Type II DM  - A1c was 6.6% last month  - Managed at home with glimepiride and Tradjenta, held on admission  - Follow CBG's and use a SSI with Novolog while in hospital     Anemia  -monitor   Markedly elevated B12 level -not on replacement -not sure of the significance  -?liver disease       DVT prophylaxis:  SQ Heparin  Code Status: Full Code   Family Communication: Multiple at bedside-- seen with interpreter  Disposition Plan:  Home once symptoms improved   Consultants:   GI  cards   Subjective: Multiple complaints: says no one is forcing her to eat-- thinks we should, pain was better per chart but she says she is still in pain, c/o numbness in legs  Objective: Vitals:   11/01/17 1800 11/01/17 1955 11/02/17 0449 11/02/17 0819  BP: 99/65 112/63 136/78 115/67  Pulse: 69 79 95 85  Resp:  19 (!) 22   Temp:  98.2 F (36.8 C) 98.4 F (36.9 C)   TempSrc:  Oral Oral   SpO2:  99% 97%   Weight:   49.2 kg (108 lb 6.4 oz)   Height:  Intake/Output Summary (Last 24 hours) at 11/02/2017 1426 Last data filed at 11/02/2017 0559 Gross per 24 hour  Intake -  Output 350 ml  Net -350 ml   Filed Weights   10/31/17 1438 11/01/17 0434 11/02/17 0449  Weight: 51.7 kg (114 lb) 49.9 kg (109 lb 14.4 oz) 49.2 kg (108 lb 6.4 oz)    Examination:  General exam: NAD-- ? Baseline-- appears confused-- disagrees with family regarding sympomts Respiratory system: no wheezing Cardiovascular system: rrr Gastrointestinal system: +Bs, NT Central nervous system: alert      Data Reviewed: I have personally  reviewed following labs and imaging studies  CBC: Recent Labs  Lab 10/28/17 1215 10/30/17 0415 10/31/17 1429 10/31/17 1458 11/01/17 0413 11/02/17 0930  WBC 6.7 6.1 4.8  --  4.8 5.5  NEUTROABS  --  3.4 2.5  --   --   --   HGB 9.5* 8.5* 8.5* 8.8* 9.3* 9.1*  HCT 29.7* 26.5* 26.0* 26.0* 28.7* 27.5*  MCV 84 81.8 80.7  --  80.8 81.1  PLT 366 361 385  --  410* 394   Basic Metabolic Panel: Recent Labs  Lab 10/28/17 1215 10/30/17 0415 10/31/17 1429 10/31/17 1458 11/01/17 0413 11/02/17 0930  NA 134 129* 128* 128* 134* 135  K 5.3* 4.5 4.1 4.1 3.7 3.5  CL 99 98* 96* 95* 99* 100*  CO2 19* 20* 22  --  22 23  GLUCOSE 183* 143* 225* 222* 126* 141*  BUN 24 24* 27* 25* 24* 21*  CREATININE 1.47* 1.52* 1.74* 1.60* 1.74* 1.87*  CALCIUM 8.9 8.5* 8.2*  --  8.6* 8.5*  MG  --   --   --   --   --  1.4*  PHOS  --   --   --   --   --  3.0   GFR: Estimated Creatinine Clearance: 17 mL/min (A) (by C-G formula based on SCr of 1.87 mg/dL (H)). Liver Function Tests: Recent Labs  Lab 10/31/17 1429  AST 20  ALT 15  ALKPHOS 73  BILITOT 0.4  PROT 6.0*  ALBUMIN 2.8*   Recent Labs  Lab 10/31/17 2152  LIPASE 51   Recent Labs  Lab 10/31/17 1430  AMMONIA 12   Coagulation Profile: No results for input(s): INR, PROTIME in the last 168 hours. Cardiac Enzymes: Recent Labs  Lab 10/31/17 2152 11/01/17 0413 11/01/17 0950  TROPONINI 0.05* 0.05* 0.05*   BNP (last 3 results) No results for input(s): PROBNP in the last 8760 hours. HbA1C: No results for input(s): HGBA1C in the last 72 hours. CBG: Recent Labs  Lab 11/01/17 1223 11/01/17 1656 11/01/17 2046 11/02/17 0801 11/02/17 1150  GLUCAP 166* 95 132* 164* 138*   Lipid Profile: No results for input(s): CHOL, HDL, LDLCALC, TRIG, CHOLHDL, LDLDIRECT in the last 72 hours. Thyroid Function Tests: No results for input(s): TSH, T4TOTAL, FREET4, T3FREE, THYROIDAB in the last 72 hours. Anemia Panel: Recent Labs    11/01/17 0413    VITAMINB12 >7,500*  FOLATE 12.5  FERRITIN 126  TIBC 297  IRON 28  RETICCTPCT 1.1   Urine analysis:    Component Value Date/Time   COLORURINE YELLOW 10/31/2017 1920   APPEARANCEUR HAZY (A) 10/31/2017 1920   LABSPEC 1.005 10/31/2017 1920   PHURINE 6.0 10/31/2017 1920   GLUCOSEU NEGATIVE 10/31/2017 1920   HGBUR MODERATE (A) 10/31/2017 1920   BILIRUBINUR NEGATIVE 10/31/2017 1920   KETONESUR NEGATIVE 10/31/2017 1920   PROTEINUR NEGATIVE 10/31/2017 1920   NITRITE NEGATIVE 10/31/2017 1920  LEUKOCYTESUR LARGE (A) 10/31/2017 1920     ) Recent Results (from the past 240 hour(s))  Urine Culture     Status: Abnormal (Preliminary result)   Collection Time: 10/31/17  8:54 PM  Result Value Ref Range Status   Specimen Description URINE, CLEAN CATCH  Final   Special Requests   Final    NONE Performed at [facility], [address] [place], [place] [address]    Culture >=100,000 COLONIES/mL KLEBSIELLA PNEUMONIAE (A)  Final   Report Status PENDING  Incomplete      Anti-infectives (From admission, onward)   Start     Dose/Rate Route Frequency Ordered Stop   11/01/17 0000  ceFEPIme (MAXIPIME) 1 g in sodium chloride 0.9 % 100 mL IVPB     1 g 200 mL/hr over 30 Minutes Intravenous Every 24 hours 10/31/17 2133     10/31/17 2100  cefTRIAXone (ROCEPHIN) 1 g in sodium chloride 0.9 % 100 mL IVPB     1 g 200 mL/hr over 30 Minutes Intravenous  Once 10/31/17 2053 10/31/17 2141       Radiology Studies: Dg Chest 2 View  Result Date: 10/31/2017 CLINICAL DATA:  Epigastric pain radiating into the chest since yesterday. Recent MI with coronary stent placement. CHF. Nonsmoker. EXAM: CHEST - 2 VIEW COMPARISON:  PA and lateral chest x-ray of Oct 30, 2017 FINDINGS: The lungs are well-expanded. The interstitial markings remain increased. Patchy densities are present at both lung bases. Small amounts of pleural fluid are noted on the right. The cardiac silhouette is mildly enlarged. There is  calcification in the wall of the aortic arch. The trachea is midline. The bony thorax exhibits no acute abnormality. IMPRESSION: CHF with mild interstitial edema. Bibasilar atelectasis. Small right and trace left pleural effusions. Thoracic aortic atherosclerosis. Electronically Signed   By: David  [place] M.D.   On: 10/31/2017 16:21   Ct Head Wo Contrast  Result Date: 10/31/2017 CLINICAL DATA:  77 year old presenting with acute onset of occipital headache, nausea and vomiting, and acute mental status changes. EXAM: CT HEAD WITHOUT CONTRAST TECHNIQUE: Contiguous axial images were obtained from the base of the skull through the vertex without intravenous contrast. COMPARISON:  None. FINDINGS: Brain: Moderate cortical, deep and cerebellar atrophy. Remote strokes involving both cerebellar hemispheres. Mild changes of small vessel disease of the white matter diffusely. No mass lesion. No midline shift. No acute hemorrhage or hematoma. No extra-axial fluid collections. No evidence of acute infarction. Vascular: Moderate BILATERAL carotid siphon and vertebral artery atherosclerosis. No hyperdense vessel. Skull: No skull fracture or other focal osseous abnormality involving the skull. Sinuses/Orbits: Visualized paranasal sinuses, bilateral mastoid air cells and bilateral middle ear cavities well-aerated. Bony nasal septal deviation to the LEFT. Visualized orbits and globes normal in appearance. Other: None. IMPRESSION: 1. No acute intracranial abnormality. 2. Remote strokes involving both cerebellar hemispheres. 3. Moderate generalized atrophy and mild chronic microvascular ischemic changes of the white matter. Electronically Signed   By: Hulan Saas M.D.   On: 10/31/2017 16:20   Dg Ugi W/high Density W/kub  Result Date: 11/01/2017 CLINICAL DATA:  Abdominal pain concerning for peptic ulcer disease.Recent myocardial infarction with stenting that precludes EGD. EXAM: UPPER GI SERIES WITH KUB TECHNIQUE: After  obtaining a scout radiograph a routine upper GI series was performed using thin and high density barium. FLUOROSCOPY TIME:  Fluoroscopy Time:  1 minutes 24 seconds Radiation Exposure Index (if provided by the fluoroscopic device): 13.6 Number of Acquired Spot Images: 6 COMPARISON:  Abdominal CT 10/12/2017 FINDINGS: Lateral pharyngeal imaging is negative for aspiration or diverticulum. No obstructive process. The esophagus has smooth mucosal contour and normal distensibility. Normal motility in the upright position. The stomach has normal shape, distensibility, and location. There is no ulcer, mass or fold thickening. No gastric outlet obstruction. Normal course of the duodenum with normal fold pattern. IMPRESSION: Normal upper GI.  No findings of gastritis or ulcer. Electronically Signed   By: Marnee Spring M.D.   On: 11/01/2017 16:06        Scheduled Meds: . aspirin  81 mg Oral Daily  . atorvastatin  80 mg Oral q1800  . carvedilol  3.125 mg Oral BID WC  . dicyclomine  10 mg Oral TID AC  . furosemide  20 mg Intravenous Q12H  . heparin  5,000 Units Subcutaneous Q8H  . insulin aspart  0-5 Units Subcutaneous QHS  . insulin aspart  0-9 Units Subcutaneous TID WC  . isosorbide mononitrate  15 mg Oral Daily  . loratadine  10 mg Oral Daily  . mirtazapine  15 mg Oral QHS  . pantoprazole  40 mg Oral BID  . potassium chloride  10 mEq Oral BID  . sucralfate  1 g Oral TID WC & HS  . ticagrelor  90 mg Oral BID   Continuous Infusions: . ceFEPime (MAXIPIME) IV Stopped (11/02/17 0143)     LOS: 1 day    Time spent: 25 min    Joseph Art, DO [facility] Pager [phone]  If 7PM-7AM, please contact night-coverage [email or web address] Password [email or web address] 11/02/2017, 2:26 PM

## 2017-11-03 LAB — BASIC METABOLIC PANEL
ANION GAP: 11 (ref 5–15)
BUN: 20 mg/dL (ref 6–20)
CO2: 24 mmol/L (ref 22–32)
Calcium: 8.7 mg/dL — ABNORMAL LOW (ref 8.9–10.3)
Chloride: 99 mmol/L — ABNORMAL LOW (ref 101–111)
Creatinine, Ser: 1.87 mg/dL — ABNORMAL HIGH (ref 0.44–1.00)
GFR calc Af Amer: 28 mL/min — ABNORMAL LOW (ref >60)
GFR, EST NON AFRICAN AMERICAN: 24 mL/min — AB (ref >60)
GLUCOSE: 115 mg/dL — AB (ref 65–99)
POTASSIUM: 3.8 mmol/L (ref 3.5–5.1)
Sodium: 134 mmol/L — ABNORMAL LOW (ref 135–145)

## 2017-11-03 LAB — CBC
HEMATOCRIT: 27.3 % — AB (ref 36.0–46.0)
Hemoglobin: 9 g/dL — ABNORMAL LOW (ref 12.0–15.0)
MCH: 26.9 pg (ref 26.0–34.0)
MCHC: 33 g/dL (ref 30.0–36.0)
MCV: 81.5 fL (ref 78.0–100.0)
Platelets: 403 10*3/uL — ABNORMAL HIGH (ref 150–400)
RBC: 3.35 MIL/uL — ABNORMAL LOW (ref 3.87–5.11)
RDW: 13.8 % (ref 11.5–15.5)
WBC: 5.6 10*3/uL (ref 4.0–10.5)

## 2017-11-03 LAB — GLUCOSE, CAPILLARY
Glucose-Capillary: 131 mg/dL — ABNORMAL HIGH (ref 65–99)
Glucose-Capillary: 202 mg/dL — ABNORMAL HIGH (ref 65–99)

## 2017-11-03 LAB — MAGNESIUM: MAGNESIUM: 2.1 mg/dL (ref 1.7–2.4)

## 2017-11-03 LAB — URINE CULTURE

## 2017-11-03 MED ORDER — DICYCLOMINE HCL 10 MG PO CAPS: 10.0000 mg | ORAL_CAPSULE | Freq: Three times a day (TID) | ORAL | 0 refills | Status: DC

## 2017-11-03 MED ORDER — FUROSEMIDE 20 MG PO TABS: 20.0000 mg | ORAL_TABLET | Freq: Two times a day (BID) | ORAL | 0 refills | Status: DC

## 2017-11-03 MED ORDER — FUROSEMIDE 20 MG PO TABS: 20.0000 mg | ORAL_TABLET | Freq: Two times a day (BID) | ORAL | Status: DC

## 2017-11-03 MED ORDER — CEFUROXIME AXETIL 500 MG PO TABS: 500.0000 mg | ORAL_TABLET | Freq: Two times a day (BID) | ORAL | 0 refills | Status: AC

## 2017-11-03 MED ORDER — SUCRALFATE 1 G PO TABS: 1.0000 g | ORAL_TABLET | Freq: Three times a day (TID) | ORAL | 0 refills | Status: DC

## 2017-11-03 NOTE — Progress Notes (Addendum)
Progress Note  Patient Name: Sheila White Date of Encounter: 11/03/2017  Primary Cardiologist: Nicki Guadalajara, MD   Subjective   No chest pain or recurrent abdominal pain. Continues to have dry cough.   Inpatient Medications    Scheduled Meds: . aspirin  81 mg Oral Daily  . atorvastatin  80 mg Oral q1800  . carvedilol  3.125 mg Oral BID WC  . dicyclomine  10 mg Oral TID AC  . furosemide  20 mg Intravenous Q12H  . heparin  5,000 Units Subcutaneous Q8H  . insulin aspart  0-5 Units Subcutaneous QHS  . insulin aspart  0-9 Units Subcutaneous TID WC  . isosorbide mononitrate  15 mg Oral Daily  . loratadine  10 mg Oral Daily  . mirtazapine  15 mg Oral QHS  . pantoprazole  40 mg Oral BID  . potassium chloride  10 mEq Oral BID  . sucralfate  1 g Oral TID WC & HS  . ticagrelor  90 mg Oral BID   Continuous Infusions: . ceFEPime (MAXIPIME) IV Stopped (11/03/17 0115)   PRN Meds: acetaminophen, gi cocktail, guaiFENesin-dextromethorphan, nitroGLYCERIN, ondansetron (ZOFRAN) IV   Vital Signs    Vitals:   11/02/17 1452 11/02/17 1800 11/02/17 2124 11/03/17 0537  BP: 103/61 111/63 108/60 121/74  Pulse: 75  80 86  Resp:   16   Temp: 97.8 F (36.6 C)  98.9 F (37.2 C) 99.2 F (37.3 C)  TempSrc: Oral  Oral Oral  SpO2: 98%  98% 97%  Weight:    109 lb (49.4 kg)  Height:        Intake/Output Summary (Last 24 hours) at 11/03/2017 0827 Last data filed at 11/03/2017 6962 Gross per 24 hour  Intake 430 ml  Output 800 ml  Net -370 ml   Filed Weights   11/01/17 0434 11/02/17 0449 11/03/17 0537  Weight: 109 lb 14.4 oz (49.9 kg) 108 lb 6.4 oz (49.2 kg) 109 lb (49.4 kg)    Telemetry    Sr at 80s- Personally Reviewed  ECG    N/A  Physical Exam   GEN: Thin frail female in no acute distress.   Neck: No JVD Cardiac: RRR, no murmurs, rubs, or gallops.  Respiratory: Clear to auscultation bilaterally. GI: Soft, nontender, non-distended  MS: No edema; No deformity. Neuro:  Nonfocal    Psych: Normal affect   Labs    Chemistry Recent Labs  Lab 10/31/17 1429  11/01/17 0413 11/02/17 0930 11/03/17 0523  NA 128*   < > 134* 135 134*  K 4.1   < > 3.7 3.5 3.8  CL 96*   < > 99* 100* 99*  CO2 22  --  GLUCOSE 225*   < > 126* 141* 115*  BUN 27*   < > 24* 21* 20  CREATININE 1.74*   < > 1.74* 1.87* 1.87*  CALCIUM 8.2*  --  8.6* 8.5* 8.7*  PROT 6.0*  --   --   --   --   ALBUMIN 2.8*  --   --   --   --   AST 20  --   --   --   --   ALT 15  --   --   --   --   ALKPHOS 73  --   --   --   --   BILITOT 0.4  --   --   --   --   GFRNONAA 27*  --  27*  24* 24*  GFRAA 31*  --  31* 28* 28*  ANIONGAP 10  --  < > = values in this interval not displayed.     Hematology Recent Labs  Lab 11/01/17 0413 11/02/17 0930 11/03/17 0523  WBC 4.8 5.5 5.6  RBC 3.55*  3.55* 3.39* 3.35*  HGB 9.3* 9.1* 9.0*  HCT 28.7* 27.5* 27.3*  MCV 80.8 81.1 81.5  MCH 26.2 26.8 26.9  MCHC 32.4 33.1 33.0  RDW 13.6 14.1 13.8  PLT 410* 394 403*    Cardiac Enzymes Recent Labs  Lab 10/31/17 2152 11/01/17 0413 11/01/17 0950  TROPONINI 0.05* 0.05* 0.05*    Recent Labs  Lab 10/30/17 0422 10/31/17 1457  TROPIPOC 0.04 0.03     BNP Recent Labs  Lab 10/30/17 0415 10/31/17 1431  BNP 1,990.1* 1,648.0*     Radiology    Dg Kayleen Memos W/high Density W/kub  Result Date: 11/01/2017 CLINICAL DATA:  Abdominal pain concerning for peptic ulcer disease.Recent myocardial infarction with stenting that precludes EGD. EXAM: UPPER GI SERIES WITH KUB TECHNIQUE: After obtaining a scout radiograph a routine upper GI series was performed using thin and high density barium. FLUOROSCOPY TIME:  Fluoroscopy Time:  1 minutes 24 seconds Radiation Exposure Index (if provided by the fluoroscopic device): 13.6 Number of Acquired Spot Images: 6 COMPARISON:  Abdominal CT 10/12/2017 FINDINGS: Lateral pharyngeal imaging is negative for aspiration or diverticulum. No obstructive process. The esophagus has  smooth mucosal contour and normal distensibility. Normal motility in the upright position. The stomach has normal shape, distensibility, and location. There is no ulcer, mass or fold thickening. No gastric outlet obstruction. Normal course of the duodenum with normal fold pattern. IMPRESSION: Normal upper GI.  No findings of gastritis or ulcer. Electronically Signed   By: Marnee Spring M.D.   On: 11/01/2017 16:06    Cardiac Studies   None this admission  Patient Profile     77 y.o. female with a hx of NIDDM, HOH, admit 10/05/2017 w/STEMI, late presentations/pDES LA, acuteS-D-CHFw/ EF 30% w/ grade 2 dd, AKI,hyponatremia, abd pain, anemia, weakness, EF 30% with grade 2 diastolic, postinfarction pericarditis, d/c oncolchicine. Presented back with chest pain and SOB.>>> found to have ? pneumonia and UTI (treatment per primary).   Assessment & Plan    1. Acute on chronic systolic CHF - BNP elevated on admission. Net I & O -620cc. Doubt accurate. Weight stable. Euvolemic. No orthopnea. On Iv lasix. Will change to po lasix  BID ( at home on  qd).  Scr stable.   2. ICM - LVEF of 30%. No ACE/ARB given renal function. Continue low dose coreg.   3. CAD s/p DES to LAD on 10/05/17 - Troponin trended down from last admission. Remains on medical therapy with BB, statin, Imdur.  - Continue DAPT  4. Abdominal pain - per GI  Other problem per primary team. Has follow up appointment with APP 12/01/17. Will sign off. Call with questions.    For questions or updates, please contact [facility] Please consult [email or web address] for contact info under Cardiology/STEMI.      Signed, Manson Passey, PA  11/03/2017, 8:27 AM    I have examined the patient and reviewed assessment and plan and discussed with patient.  Agree with above as stated.  She feels better.  COntinue current cardiac meds including DAPT given her recent MI and PCI.  Will sign off.  Ok to discharge from a cardiac  standpoint.   Lance Muss

## 2017-11-03 NOTE — Discharge Summary (Signed)
Physician Discharge Summary  Sheila White UJW:[id number] DOB: Apr 29, 1941 DOA: 10/31/2017  PCP: Julieanne Manson, MD  Admit date: 10/31/2017 Discharge date: 11/03/2017  Admitted From home Disposition: home Recommendations for Outpatient Follow-up:  1. Follow up with PCP in 1-2 weeks 2. Please obtain BMP/CBC in one week  Home Health:yes Equipment/Devices none Discharge Condition stable CODE STATUS full Diet recommendation:cardiac Brief/Interim Summary:77 y.o.femalewith medical history significant fortype 2 diabetes mellitus, coronary artery disease,STEMIlast month treated with DES to LAD,and systolic CHF, now presenting to the emergency department for evaluation of abdominal pain, shortness of breath, nonproductive cough, and fatigue. Patient was discharged from the hospital on 10/24/2017 after management of STEMI. She developed systolic CHF with EF 30% but diuresis was complicated by hyponatremia and acute kidney injury. Since returning home, she has had progressive dyspnea with orthopnea, nonproductive cough, and fatigue. She denies fevers or chills. She has also been experiencing abdominal pain with mild nausea but no vomiting or diarrhea. She had recently undergone extensive inpatient work-up for the abdominal pain which was negative, and she was started on twice daily Protonix for suspected PUD. EMS was called for transport to the hospital, there was concern that the abdominal pain may be anginal and she was treated with aspirin and nitroglycerin prior to arrival in the ED.   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 Lasix 20 mg twice a  day upon discharge.  Patient was seen in consultation by cardiology.  Has a follow-up appointment with cardiology on 12/01/2017.  Continue aspirin Brilinta, statins, Imdur.  ARB ACE ACE inhibitor not prescribed due to renal insufficiency.  2] Klebsiella UTI patient received cefepime in hospital for discharge on Ceftin twice a day for 5 days.  3] type 2 diabetes restart home dose of glimepiride and Tradjenta  4] renal insufficiency stable  Discharge Instructions  Discharge Instructions    Diet - low sodium heart healthy   Complete by:  As directed    Increase activity slowly   Complete by:  As directed      Allergies as of 11/03/2017   No Known Allergies     Medication List    STOP taking these medications   colchicine 0.6 MG tablet     TAKE these medications   aspirin 81 MG chewable tablet Chew 1 tablet (81 mg total) by mouth daily.   atorvastatin 80 MG tablet Commonly known as:  LIPITOR Take 1 tablet (80 mg total) by mouth daily.   carvedilol 3.125 MG tablet Commonly known as:  COREG Take 3.125 mg by mouth 2 (two) times daily with a meal. What changed:  Another medication with the same name was removed. Continue taking this medication, and follow the directions you see here.   cefUROXime 500 MG tablet Commonly known as:  CEFTIN Take 1 tablet (500 mg total) by mouth 2 (two) times daily for 5 days.   chlorpheniramine-HYDROcodone 10-8 MG/5ML Suer Commonly known as:  TUSSIONEX PENNKINETIC ER Take 5 mLs by mouth at bedtime as needed for cough.   dicyclomine 10 MG capsule Commonly known as:  BENTYL Take 1 capsule (10 mg total) by mouth 3 (three) times daily before meals.  furosemide 20 MG tablet Commonly known as:  LASIX Take 1 tablet (20 mg total) by mouth 2 (two) times daily. What changed:  when to take this   glimepiride 2 MG tablet Commonly known as:  AMARYL Take 1 tablet (2 mg total) by mouth daily with breakfast.   isosorbide mononitrate 30 MG 24 hr  tablet Commonly known as:  IMDUR Take 0.5 tablets (15 mg total) by mouth daily.   linagliptin 5 MG Tabs tablet Commonly known as:  TRADJENTA Take 1 tablet (5 mg total) by mouth daily.   loratadine 10 MG tablet Commonly known as:  CLARITIN Take 1 tablet (10 mg total) by mouth daily.   nitroGLYCERIN 0.4 MG SL tablet Commonly known as:  NITROSTAT Place 1 tablet (0.4 mg total) under the tongue every 5 (five) minutes x 3 doses as needed for chest pain.   pantoprazole 40 MG tablet Commonly known as:  PROTONIX Take 1 tablet (40 mg total) by mouth 2 (two) times daily.   potassium chloride 10 MEQ tablet Commonly known as:  K-DUR Take 1 tablet (10 mEq total) by mouth 2 (two) times daily.   sucralfate 1 g tablet Commonly known as:  CARAFATE Take 1 tablet (1 g total) by mouth 4 (four) times daily -  with meals and at bedtime.   ticagrelor 90 MG Tabs tablet Commonly known as:  BRILINTA Take 1 tablet (90 mg total) by mouth 2 (two) times daily.      Follow-up Information    [facility].   Specialty:  Home Health Services Why:  Registered Nurse, Physical Therapy, Social Worker.  Contact information: [address] [place] [address] [phone]        Barrett, Joline Salt, PA-C. Go on 12/01/2017.   Specialties:  Cardiology, Radiology Why:  :30am for 1 month follow up Contact information: [address] [place] [address] [phone]          No Known Allergies  Consultations: chmg  Procedures/Studies: Ct Abdomen Pelvis Wo Contrast  Result Date: 10/12/2017 CLINICAL DATA:  Abdominal distension.  Upper abdominal pain. EXAM: CT ABDOMEN AND PELVIS WITHOUT CONTRAST TECHNIQUE: Multidetector CT imaging of the abdomen and pelvis was performed following the standard protocol without IV contrast. COMPARISON:  Ultrasound 10/11/2017 FINDINGS: Lower chest: Lung bases are clear. Hepatobiliary: No focal hepatic lesion. No biliary  duct dilatation. Gallbladder is mildly distended but within normal limits. Probable sludge within the fundus (sagittal image 68) no pericholecystic fluid inflammation identified on noncontrast exam. Common bile duct normal caliber. Pancreas: Pancreas is normal. No ductal dilatation. No pancreatic inflammation. Spleen: Normal spleen Adrenals/urinary tract: Adrenal glands and kidneys are normal. The RIGHT kidney smaller than LEFT. No obstructive uropathy. Gas within the bladder. Foley catheter within the bladder. Stomach/Bowel: Stomach, small bowel, appendix, and cecum are normal. The colon and rectosigmoid colon are normal. Vascular/Lymphatic: Abdominal aorta is normal caliber with atherosclerotic calcification. There is no retroperitoneal or periportal lymphadenopathy. No pelvic lymphadenopathy. Reproductive: Uterus normal for age Other: No free fluid. Musculoskeletal: No aggressive osseous lesion. IMPRESSION: 1. Large bilateral pleural effusions with dense bibasilar passive atelectasis. 2. No acute findings in abdomen pelvis. 3. Gallbladder sludge and mild distension. 4.  Aortic Atherosclerosis (ICD10-I70.0). 5. Foley catheter in bladder. Electronically Signed   By: Genevive Bi M.D.   On: 10/12/2017 16:52   Dg Chest 2 View  Result Date: 10/31/2017 CLINICAL DATA:  Epigastric pain radiating into the chest since yesterday. Recent MI with  coronary stent placement. CHF. Nonsmoker. EXAM: CHEST - 2 VIEW COMPARISON:  PA and lateral chest x-ray of Oct 30, 2017 FINDINGS: The lungs are well-expanded. The interstitial markings remain increased. Patchy densities are present at both lung bases. Small amounts of pleural fluid are noted on the right. The cardiac silhouette is mildly enlarged. There is calcification in the wall of the aortic arch. The trachea is midline. The bony thorax exhibits no acute abnormality. IMPRESSION: CHF with mild interstitial edema. Bibasilar atelectasis. Small right and trace left pleural  effusions. Thoracic aortic atherosclerosis. Electronically Signed   By: David  [place] M.D.   On: 10/31/2017 16:21   Dg Chest 2 View  Result Date: 10/30/2017 CLINICAL DATA:  Shortness of breath EXAM: CHEST - 2 VIEW COMPARISON:  10/28/2017, 10/18/2017 FINDINGS: Small bilateral pleural effusions. Cardiomegaly with vascular congestion and moderate pulmonary edema. Bibasilar consolidations. Aortic atherosclerosis. No pneumothorax. IMPRESSION: 1. Cardiomegaly with vascular congestion and mild to moderate pulmonary edema. 2. Small pleural effusions with bibasilar atelectasis or pneumonia. Electronically Signed   By: Jasmine Pang M.D.   On: 10/30/2017 03:50   Dg Chest 2 View  Result Date: 10/29/2017 CLINICAL DATA:  77 year old female with shortness of breath and cough for 1 week. EXAM: CHEST - 2 VIEW COMPARISON:  Chest x-ray 10/18/2017. FINDINGS: Patchy multifocal interstitial and airspace disease noted throughout the mid to lower lungs bilaterally. Small bilateral pleural effusions. No evidence of pulmonary edema. Heart size is normal. Upper mediastinal contours are within normal limits. Aortic atherosclerosis. IMPRESSION: 1. Findings are concerning for multilobar bronchopneumonia throughout the lung bases with associated small bilateral parapneumonic pleural effusions. 2. Aortic atherosclerosis. Electronically Signed   By: Trudie Reed M.D.   On: 10/29/2017 09:43   Dg Chest 2 View  Result Date: 10/18/2017 CLINICAL DATA:  Dry cough EXAM: CHEST - 2 VIEW COMPARISON:  10/06/2017 FINDINGS: Small pleural effusions. Cardiomegaly with mild vascular congestion. Bibasilar airspace disease. Aortic atherosclerosis. No pneumothorax. IMPRESSION: 1. Small bilateral pleural effusions with bibasilar atelectasis or pneumonia 2. Cardiomegaly with mild vascular congestion. Overall improved aeration and decreased edema since comparison radiograph from 10/06/2017. Electronically Signed   By: Jasmine Pang M.D.   On: 10/18/2017  20:59   Ct Head Wo Contrast  Result Date: 10/31/2017 CLINICAL DATA:  77 year old presenting with acute onset of occipital headache, nausea and vomiting, and acute mental status changes. EXAM: CT HEAD WITHOUT CONTRAST TECHNIQUE: Contiguous axial images were obtained from the base of the skull through the vertex without intravenous contrast. COMPARISON:  None. FINDINGS: Brain: Moderate cortical, deep and cerebellar atrophy. Remote strokes involving both cerebellar hemispheres. Mild changes of small vessel disease of the white matter diffusely. No mass lesion. No midline shift. No acute hemorrhage or hematoma. No extra-axial fluid collections. No evidence of acute infarction. Vascular: Moderate BILATERAL carotid siphon and vertebral artery atherosclerosis. No hyperdense vessel. Skull: No skull fracture or other focal osseous abnormality involving the skull. Sinuses/Orbits: Visualized paranasal sinuses, bilateral mastoid air cells and bilateral middle ear cavities well-aerated. Bony nasal septal deviation to the LEFT. Visualized orbits and globes normal in appearance. Other: None. IMPRESSION: 1. No acute intracranial abnormality. 2. Remote strokes involving both cerebellar hemispheres. 3. Moderate generalized atrophy and mild chronic microvascular ischemic changes of the white matter. Electronically Signed   By: Hulan Saas M.D.   On: 10/31/2017 16:20   Nm Hepatobiliary Liver Func  Result Date: 10/14/2017 CLINICAL DATA:  History of abdominal pain. Following ingestion of Ensure, patient had copious diarrhea and refused further  imaging. EXAM: NUCLEAR MEDICINE HEPATOBILIARY IMAGING TECHNIQUE: Sequential images of the abdomen were obtained out to 60 minutes following intravenous administration of radiopharmaceutical. RADIOPHARMACEUTICALS:  5.15 mCi Tc-41m  Choletec IV COMPARISON:  CT of the abdomen and pelvis on 10/12/2017 FINDINGS: Prompt uptake and biliary excretion of activity by the liver is seen.  Gallbladder activity is visualized, consistent with patency of cystic duct. At the conclusion of the exam, no definitive bowel activity is identified. Therefore patency of the common bile duct cannot be assessed. Exam was terminated early at the request of the patient. IMPRESSION: 1. No evidence for acute cholecystitis.  Patent cystic duct. 2. Patency of the common bile duct not assessed due to early termination of the exam. Electronically Signed   By: Norva Pavlov M.D.   On: 10/14/2017 13:23   US Abdomen Limited  Result Date: 10/11/2017 CLINICAL DATA:  Right upper quadrant pain EXAM: ULTRASOUND ABDOMEN LIMITED RIGHT UPPER QUADRANT COMPARISON:  None. FINDINGS: Gallbladder: Gallbladder is distended. No gallstones or wall thickening visualized. No pericholecystic fluid. No sonographic Murphy sign noted by sonographer. Common bile duct: Diameter: 5 mm. No intrahepatic or extrahepatic biliary duct dilatation. Liver: No focal lesion identified. Within normal limits in parenchymal echogenicity. Portal vein is patent on color Doppler imaging with normal direction of blood flow towards the liver. There is a right pleural effusion. IMPRESSION: Gallbladder distended but otherwise appears unremarkable. Significance of this finding is uncertain. Given this appearance, it may be prudent to consider nuclear medicine hepatobiliary imaging study to assess for cystic duct patency. Right pleural effusion. Study otherwise unremarkable. Electronically Signed   By: Bretta Bang III M.D.   On: 10/11/2017 21:15   Dg Chest Port 1 View  Result Date: 10/06/2017 CLINICAL DATA:  MI. EXAM: PORTABLE CHEST 1 VIEW COMPARISON:  None. FINDINGS: Cardiomegaly. Perihilar and lower lobe airspace opacities, likely edema. Layering bilateral effusions. IMPRESSION: Cardiomegaly with mild pulmonary edema and layering effusions. Electronically Signed   By: Charlett Nose M.D.   On: 10/06/2017 10:37   Dg Ugi W/high Density W/kub  Result  Date: 11/01/2017 CLINICAL DATA:  Abdominal pain concerning for peptic ulcer disease.Recent myocardial infarction with stenting that precludes EGD. EXAM: UPPER GI SERIES WITH KUB TECHNIQUE: After obtaining a scout radiograph a routine upper GI series was performed using thin and high density barium. FLUOROSCOPY TIME:  Fluoroscopy Time:  1 minutes 24 seconds Radiation Exposure Index (if provided by the fluoroscopic device): 13.6 Number of Acquired Spot Images: 6 COMPARISON:  Abdominal CT 10/12/2017 FINDINGS: Lateral pharyngeal imaging is negative for aspiration or diverticulum. No obstructive process. The esophagus has smooth mucosal contour and normal distensibility. Normal motility in the upright position. The stomach has normal shape, distensibility, and location. There is no ulcer, mass or fold thickening. No gastric outlet obstruction. Normal course of the duodenum with normal fold pattern. IMPRESSION: Normal upper GI.  No findings of gastritis or ulcer. Electronically Signed   By: Marnee Spring M.D.   On: 11/01/2017 16:06   [place] Ekg Site Rite  Result Date: 10/08/2017 If Site Rite image not attached, placement could not be confirmed due to current cardiac rhythm.   (Echo, Carotid, EGD, Colonoscopy, ERCP)    Subjective:   Discharge Exam: Vitals:   11/03/17 0537 11/03/17 0940  BP: 121/74 105/71  Pulse: 86 88  Resp:    Temp: 99.2 F (37.3 C)   SpO2: 97%    Vitals:   11/02/17 1800 11/02/17 2124 11/03/17 0537 11/03/17 0940  BP: 111/63 108/60  121/74 105/71  Pulse:  80 86 88  Resp:  16    Temp:  98.9 F (37.2 C) 99.2 F (37.3 C)   TempSrc:  Oral Oral   SpO2:  98% 97%   Weight:   49.4 kg (109 lb)   Height:        General: Pt is alert, awake, not in acute distress Cardiovascular: RRR, S1/S2 +, no rubs, no gallops Respiratory: CTA bilaterally, no wheezing, no rhonchi Abdominal: Soft, NT, ND, bowel sounds + Extremities: no edema, no cyanosis    The results of significant  diagnostics from this hospitalization (including imaging, microbiology, ancillary and laboratory) are listed below for reference.     Microbiology: Recent Results (from the past 240 hour(s))  Urine Culture     Status: Abnormal   Collection Time: 10/31/17  8:54 PM  Result Value Ref Range Status   Specimen Description URINE, CLEAN CATCH  Final   Special Requests   Final    NONE Performed at [facility], [address] [place], [place] [address]    Culture >=100,000 COLONIES/mL KLEBSIELLA PNEUMONIAE (A)  Final   Report Status 11/03/2017 FINAL  Final   Organism ID, Bacteria KLEBSIELLA PNEUMONIAE (A)  Final      Susceptibility   Klebsiella pneumoniae - MIC*    AMPICILLIN >=32 RESISTANT Resistant     CEFAZOLIN <=4 SENSITIVE Sensitive     CEFTRIAXONE <=1 SENSITIVE Sensitive     CIPROFLOXACIN >=4 RESISTANT Resistant     GENTAMICIN <=1 SENSITIVE Sensitive     IMIPENEM <=0.25 SENSITIVE Sensitive     NITROFURANTOIN >=512 RESISTANT Resistant     TRIMETH/SULFA <=20 SENSITIVE Sensitive     AMPICILLIN/SULBACTAM >=32 RESISTANT Resistant     PIP/TAZO 16 SENSITIVE Sensitive     Extended ESBL NEGATIVE Sensitive     * >=100,000 COLONIES/mL KLEBSIELLA PNEUMONIAE     Labs: BNP (last 3 results) Recent Labs    10/21/17 0319 10/30/17 0415 10/31/17 1431  BNP 868.5* 1,990.1* 1,648.0*   Basic Metabolic Panel: Recent Labs  Lab 10/30/17 0415 10/31/17 1429 10/31/17 1458 11/01/17 0413 11/02/17 0930 11/03/17 0523  NA 129* 128* 128* 134* 135 134*  K 4.5 4.1 4.1 3.7 3.5 3.8  CL 98* 96* 95* 99* 100* 99*  CO2 20* 22  --  22 23 24   GLUCOSE 143* 225* 222* 126* 141* 115*  BUN 24* 27* 25* 24* 21* 20  CREATININE 1.52* 1.74* 1.60* 1.74* 1.87* 1.87*  CALCIUM 8.5* 8.2*  --  8.6* 8.5* 8.7*  MG  --   --   --   --  1.4* 2.1  PHOS  --   --   --   --  3.0  --    Liver Function Tests: Recent Labs  Lab 10/31/17 1429  AST 20  ALT 15  ALKPHOS 73  BILITOT 0.4  PROT 6.0*  ALBUMIN 2.8*    Recent Labs  Lab 10/31/17 2152  LIPASE 51   Recent Labs  Lab 10/31/17 1430  AMMONIA 12   CBC: Recent Labs  Lab 10/30/17 0415 10/31/17 1429 10/31/17 1458 11/01/17 0413 11/02/17 0930 11/03/17 0523  WBC 6.1 4.8  --  4.8 5.5 5.6  NEUTROABS 3.4 2.5  --   --   --   --   HGB 8.5* 8.5* 8.8* 9.3* 9.1* 9.0*  HCT 26.5* 26.0* 26.0* 28.7* 27.5* 27.3*  MCV 81.8 80.7  --  80.8 81.1 81.5  PLT 361 385  --  410* 394  403*   Cardiac Enzymes: Recent Labs  Lab 10/31/17 2152 11/01/17 0413 11/01/17 0950  TROPONINI 0.05* 0.05* 0.05*   BNP: Invalid input(s): POCBNP CBG: Recent Labs  Lab 11/02/17 0801 11/02/17 1150 11/02/17 1636 11/02/17 2121 11/03/17 0741  GLUCAP 164* 138* 184* 137* 131*   D-Dimer No results for input(s): DDIMER in the last 72 hours. Hgb A1c No results for input(s): HGBA1C in the last 72 hours. Lipid Profile No results for input(s): CHOL, HDL, LDLCALC, TRIG, CHOLHDL, LDLDIRECT in the last 72 hours. Thyroid function studies No results for input(s): TSH, T4TOTAL, T3FREE, THYROIDAB in the last 72 hours.  Invalid input(s): FREET3 Anemia work up Recent Labs    11/01/17 0413  VITAMINB12 >7,500*  FOLATE 12.5  FERRITIN 126  TIBC 297  IRON 28  RETICCTPCT 1.1   Urinalysis    Component Value Date/Time   COLORURINE YELLOW 10/31/2017 1920   APPEARANCEUR HAZY (A) 10/31/2017 1920   LABSPEC 1.005 10/31/2017 1920   PHURINE 6.0 10/31/2017 1920   GLUCOSEU NEGATIVE 10/31/2017 1920   HGBUR MODERATE (A) 10/31/2017 1920   BILIRUBINUR NEGATIVE 10/31/2017 1920   KETONESUR NEGATIVE 10/31/2017 1920   PROTEINUR NEGATIVE 10/31/2017 1920   NITRITE NEGATIVE 10/31/2017 1920   LEUKOCYTESUR LARGE (A) 10/31/2017 1920   Sepsis Labs Invalid input(s): PROCALCITONIN,  WBC,  LACTICIDVEN Microbiology Recent Results (from the past 240 hour(s))  Urine Culture     Status: Abnormal   Collection Time: 10/31/17  8:54 PM  Result Value Ref Range Status   Specimen Description URINE,  CLEAN CATCH  Final   Special Requests   Final    NONE Performed at [facility], [address], [place], [place] [address]    Culture >=100,000 COLONIES/mL KLEBSIELLA PNEUMONIAE (A)  Final   Report Status 11/03/2017 FINAL  Final   Organism ID, Bacteria KLEBSIELLA PNEUMONIAE (A)  Final      Susceptibility   Klebsiella pneumoniae - MIC*    AMPICILLIN >=32 RESISTANT Resistant     CEFAZOLIN <=4 SENSITIVE Sensitive     CEFTRIAXONE <=1 SENSITIVE Sensitive     CIPROFLOXACIN >=4 RESISTANT Resistant     GENTAMICIN <=1 SENSITIVE Sensitive     IMIPENEM <=0.25 SENSITIVE Sensitive     NITROFURANTOIN >=512 RESISTANT Resistant     TRIMETH/SULFA <=20 SENSITIVE Sensitive     AMPICILLIN/SULBACTAM >=32 RESISTANT Resistant     PIP/TAZO 16 SENSITIVE Sensitive     Extended ESBL NEGATIVE Sensitive     * >=100,000 COLONIES/mL KLEBSIELLA PNEUMONIAE     Time coordinating discharge:   SIGNED:   Alwyn Ren, MD  [facility] 11/03/2017, 10:37 AM Pager   If 7PM-7AM, please contact night-coverage [email or web address] Password [email or web address]

## 2017-11-07 ENCOUNTER — Telehealth: Payer: Self-pay | Admitting: Physician Assistant

## 2017-11-07 NOTE — Telephone Encounter (Signed)
Routed to Dr. Christiana Pellant PA to advise on [facility] PT orders

## 2017-11-07 NOTE — Telephone Encounter (Signed)
The patient was just seen by Bjorn Loser.  Refer to [facility] for Home PT schedule

## 2017-11-07 NOTE — Telephone Encounter (Signed)
New Message   Shaunda with [facility] is calling to get orders for Home Health Physical Therapy. Two week two, one week two.  Please call to discuss.

## 2017-11-08 NOTE — Telephone Encounter (Signed)
2 visits a week, then 1 visit a week is fine.

## 2017-11-09 NOTE — Telephone Encounter (Signed)
Spoke to  [place] - verbal order given  Per RHONDA - OKAY TO FOR PHYSICAL THERAPY VISIT AS REQUESTED.

## 2017-11-11 ENCOUNTER — Telehealth: Payer: Self-pay | Admitting: Cardiovascular Disease

## 2017-11-11 NOTE — Telephone Encounter (Signed)
Since patient was recently back in hospital, would recommend a follow-up office visit within the next week prior to her departure

## 2017-11-11 NOTE — Telephone Encounter (Signed)
Patient's daughter walked in and asked (via yellow walk-in form) if patient can travel by plane to [place] from May 26-June 2.   Please call [phone]  Will route to MD

## 2017-11-14 NOTE — Telephone Encounter (Signed)
Daughter returned call. Explained that MD advised her mom be seen before traveling. Scheduled MD OV on 11/18/17

## 2017-11-14 NOTE — Telephone Encounter (Signed)
LM for daughter that MD has suggested an office visit prior to travel, that he has 4 appointments in AM on 5/24, she should call back as soon as she gets the message to make an appointment.

## 2017-11-17 ENCOUNTER — Encounter: Payer: Self-pay | Admitting: Internal Medicine

## 2017-11-17 ENCOUNTER — Ambulatory Visit: Payer: Self-pay | Admitting: Internal Medicine

## 2017-11-17 VITALS — BP 124/78 | HR 84 | Resp 12 | Ht 58.5 in | Wt 112.0 lb

## 2017-11-17 DIAGNOSIS — R300 Dysuria: Secondary | ICD-10-CM

## 2017-11-17 DIAGNOSIS — E119 Type 2 diabetes mellitus without complications: Secondary | ICD-10-CM

## 2017-11-17 DIAGNOSIS — I5041 Acute combined systolic (congestive) and diastolic (congestive) heart failure: Secondary | ICD-10-CM

## 2017-11-17 DIAGNOSIS — N289 Disorder of kidney and ureter, unspecified: Secondary | ICD-10-CM

## 2017-11-17 DIAGNOSIS — D649 Anemia, unspecified: Secondary | ICD-10-CM

## 2017-11-17 DIAGNOSIS — I25118 Atherosclerotic heart disease of native coronary artery with other forms of angina pectoris: Secondary | ICD-10-CM

## 2017-11-17 LAB — POCT URINALYSIS DIPSTICK
Bilirubin, UA: NEGATIVE
Blood, UA: NEGATIVE
Glucose, UA: NEGATIVE
KETONES UA: NEGATIVE
Leukocytes, UA: NEGATIVE
Nitrite, UA: NEGATIVE
PH UA: 6 (ref 5.0–8.0)
Protein, UA: NEGATIVE
Spec Grav, UA: <=1.005 — AB (ref 1.010–1.025)
UROBILINOGEN UA: 0.2 U/dL

## 2017-11-17 MED ORDER — FUROSEMIDE 40 MG PO TABS: ORAL_TABLET | ORAL | 11 refills | Status: DC

## 2017-11-17 MED ORDER — POTASSIUM CHLORIDE ER 10 MEQ PO TBCR: 10.0000 meq | EXTENDED_RELEASE_TABLET | Freq: Two times a day (BID) | ORAL | 11 refills | Status: DC

## 2017-11-17 NOTE — Progress Notes (Signed)
Subjective:    Patient ID: Sheila White, female    DOB: 11-21-1940, 77 y.o.   MRN: [id number]  HPI   Here to establish Daughter interprets  1.  Cough:  Dry cough started early May.  Was hospitalized 10/31/2017 for systolic heart failure status post DES to LAD following STEMI in April.  She was also diagnosed with Klebsiella UTI and treated for both.   Cough was staying about the same until yesterday when she also developed a sore throat. No fever. She does feel short of breath after coughing spell.  When not coughing, feels her breathing is normal. No chest pain similar to when she had recent MI. 23 yo granddaughter with a cough 3 days ago.  She also had a sore throat. Tussionex did not help last night. Daughter gave her Robitussin, helped a little bit. No definite swelling of ankles in past few days. No orthopnea or PND.  Sleeping on 1 pillow and does not seem to be short of breath lying on just on that pillow. Sees Dr. Nicholaus Bloom, Cardiology tomorrow late morning  2.  Elevated LDL:  Recently at 90.  Started on Atorvastatin.  3.  Renal insufficiency:  Creatinine running in 1.87.  With normal BUN.  Not clear if this was a problem before her MI and CHF.  Was followed in a [facility] [place] office prior to recent hospitalzations.    4.  DM:  Was taking Metformin and Glimiperide prior to MI, but Metformin stopped afterward. Two A1Cs done the same day on 4/10 with significant difference:  7.6 and 6.6.   Checking sugars every morning 97-137 Every evening:  After dinner 124.  No eye check in past year. Does not always check feet at night. She does have numbness in bilateral feet.  Current Meds  Medication Sig  . aspirin 81 MG chewable tablet Chew 1 tablet (81 mg total) by mouth daily.  Marland Kitchen atorvastatin (LIPITOR) 80 MG tablet Take 1 tablet (80 mg total) by mouth daily.  . carvedilol (COREG) 3.125 MG tablet Take 3.125 mg by mouth 2 (two) times daily with a meal.  . chlorpheniramine-HYDROcodone  (TUSSIONEX PENNKINETIC ER) 10-8 MG/5ML SUER Take 5 mLs by mouth at bedtime as needed for cough.  . dicyclomine (BENTYL) 10 MG capsule Take 1 capsule (10 mg total) by mouth 3 (three) times daily before meals.  . furosemide (LASIX) 40 MG tablet 1 tab by mouth twice daily.  Marland Kitchen glimepiride (AMARYL) 2 MG tablet Take 1 tablet (2 mg total) by mouth daily with breakfast.  . isosorbide mononitrate (IMDUR) 30 MG 24 hr tablet Take 0.5 tablets (15 mg total) by mouth daily.  Marland Kitchen linagliptin (TRADJENTA) 5 MG TABS tablet Take 1 tablet (5 mg total) by mouth daily.  Marland Kitchen loratadine (CLARITIN) 10 MG tablet Take 1 tablet (10 mg total) by mouth daily.  . pantoprazole (PROTONIX) 40 MG tablet Take 1 tablet (40 mg total) by mouth 2 (two) times daily.  . potassium chloride (K-DUR) 10 MEQ tablet Take 1 tablet (10 mEq total) by mouth 2 (two) times daily.  . sucralfate (CARAFATE) 1 g tablet Take 1 tablet (1 g total) by mouth 4 (four) times daily -  with meals and at bedtime.  . ticagrelor (BRILINTA) 90 MG TABS tablet Take 1 tablet (90 mg total) by mouth 2 (two) times daily.  . [DISCONTINUED] furosemide (LASIX) 20 MG tablet Take 1 tablet (20 mg total) by mouth 2 (two) times daily.  . [DISCONTINUED] potassium chloride (K-DUR) 10  MEQ tablet Take 1 tablet (10 mEq total) by mouth 2 (two) times daily.  . [DISCONTINUED] potassium chloride (K-DUR) 10 MEQ tablet Take 1 tablet (10 mEq total) by mouth 2 (two) times daily.    No Known Allergies   Past Medical History:  Diagnosis Date  . Acute combined systolic and diastolic CHF, NYHA class 3 (HCC) 10/10/2017   in setting of MI  . HOH (hard of hearing)   . Ischemic cardiomyopathy 10/05/2017  . Non-insulin dependent type 2 diabetes mellitus (HCC) 2004  . 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 STEMI involving left anterior descending coronary artery (HCC) 10/05/2017    Past Surgical History:  Procedure Laterality Date  . CORONARY/GRAFT ACUTE MI REVASCULARIZATION N/A  10/05/2017   Procedure: Coronary/Graft Acute MI Revascularization;  Surgeon: Lennette Bihari, MD;  Location: [facility] INVASIVE CV LAB;  Service: Cardiovascular;  Laterality: N/A;  . LEFT HEART CATH AND CORONARY ANGIOGRAPHY N/A 10/05/2017   Procedure: LEFT HEART CATH AND CORONARY ANGIOGRAPHY;  Surgeon: Lennette Bihari, MD;  Location: [facility] INVASIVE CV LAB;  Service: Cardiovascular;  Laterality: N/A;   Social History   Socioeconomic History  . Marital status: Widowed    Spouse name: Not on file  . Number of children: 8  . Years of education: 59  . Highest education level: Not on file  Occupational History  . Not on file  Social Needs  . Financial resource strain: Not on file  . Food insecurity:    Worry: Not on file    Inability: Not on file  . Transportation needs:    Medical: Not on file    Non-medical: Not on file  Tobacco Use  . Smoking status: Never Smoker  . Smokeless tobacco: Never Used  Substance and Sexual Activity  . Alcohol use: Not Currently  . Drug use: Never  . Sexual activity: Not Currently  Lifestyle  . Physical activity:    Days per week: Not on file    Minutes per session: Not on file  . Stress: Not on file  Relationships  . Social connections:    Talks on phone: Not on file    Gets together: Not on file    Attends religious service: Not on file    Active member of club or organization: Not on file    Attends meetings of clubs or organizations: Not on file    Relationship status: Not on file  . Intimate partner violence:    Fear of current or ex partner: Not on file    Emotionally abused: Not on file    Physically abused: Not on file    Forced sexual activity: Not on file  Other Topics Concern  . Not on file  Social History Narrative   Living with daughter, Wray Kearns and her husband and her 3 sons.    Family History  Problem Relation Age of Onset  . Alcohol abuse Father       Review of Systems     Objective:   Physical Exam  NAD when not  coughing. When has a bout of cough, which is dry, appears anxious Points to suprasternal notch as area of throat discomfort with cough only HEENT:  PERRL, EOMI, TMs pearly gray.  Throat without injection.  Neck:  Supple, No adenopathy, no thyromegaly Chest:  Bibasilar crackles.  No definite decrease to percussion over area.  Does not clear with cough. CV:  RRR with normal S1 and S2, No S3 or S4, murmur or rub appreciated.  Mild JVD.  No carotid bruits. Carotid, radial and DP pulses normal and equal. LE:  Mild ankle edema    Assessment & Plan:  1.  CHF, uncompensated. CBC and CMP to check for worsening anemia and electrolytes, renal status.   Increase Furosemide to 40 mg twice daily.   Have refilled potassium for 10 mEq twice daily, but will see what her level is before having daughter pick up. Seeing Dr. Tresa Endo tomorrow.  May need titration of Carvedilol. ACE I/ARB being held due to renal insufficiency. Sending for records from previous caregiver in [place]  2.  DM: phone message into MAP at [facility] pharmacy to see if Tradjenta available there.  Continuing Glimepiride.  3.  Relative elevated LDL:  On 80 mg of Atorvastatin.  Will switch this to [facility].   4.  CAD with recent (April) PCI and  DES stenting of LAD x 3.  Ticagrelor 90 mg twice daily.  Checking on this also with MAP.  5.  Renal insufficiency:  Sending for old records to determine how much of this may be chronic vs subacute with her recent cardiac injury and failure.  6.  Anemia:  This is an addendum--did not document with phone call on 11/18/17 discussion with daughter [name] to pick up Ferrous Gluconate 325 mg once daily with half and orange as her iron studies were borderline.  Expect some of this due to chronic kidney disease with DM, but again, sending for records.

## 2017-11-18 ENCOUNTER — Ambulatory Visit (INDEPENDENT_AMBULATORY_CARE_PROVIDER_SITE_OTHER): Payer: Self-pay | Admitting: Cardiovascular Disease

## 2017-11-18 ENCOUNTER — Encounter: Payer: Self-pay | Admitting: Cardiovascular Disease

## 2017-11-18 VITALS — BP 118/62 | HR 86 | Ht 58.5 in | Wt 112.0 lb

## 2017-11-18 DIAGNOSIS — I10 Essential (primary) hypertension: Secondary | ICD-10-CM

## 2017-11-18 DIAGNOSIS — N289 Disorder of kidney and ureter, unspecified: Secondary | ICD-10-CM

## 2017-11-18 DIAGNOSIS — Z79899 Other long term (current) drug therapy: Secondary | ICD-10-CM

## 2017-11-18 DIAGNOSIS — I255 Ischemic cardiomyopathy: Secondary | ICD-10-CM

## 2017-11-18 DIAGNOSIS — I241 Dressler's syndrome: Secondary | ICD-10-CM

## 2017-11-18 DIAGNOSIS — I25118 Atherosclerotic heart disease of native coronary artery with other forms of angina pectoris: Secondary | ICD-10-CM

## 2017-11-18 DIAGNOSIS — I5023 Acute on chronic systolic (congestive) heart failure: Secondary | ICD-10-CM

## 2017-11-18 DIAGNOSIS — I213 ST elevation (STEMI) myocardial infarction of unspecified site: Secondary | ICD-10-CM

## 2017-11-18 DIAGNOSIS — I5042 Chronic combined systolic (congestive) and diastolic (congestive) heart failure: Secondary | ICD-10-CM

## 2017-11-18 LAB — COMPREHENSIVE METABOLIC PANEL
A/G RATIO: 1.3 (ref 1.2–2.2)
ALK PHOS: 72 IU/L (ref 39–117)
ALT: 12 IU/L (ref 0–32)
AST: 15 IU/L (ref 0–40)
Albumin: 4 g/dL (ref 3.5–4.8)
BILIRUBIN TOTAL: 0.3 mg/dL (ref 0.0–1.2)
BUN/Creatinine Ratio: 20 (ref 12–28)
BUN: 27 mg/dL (ref 8–27)
CHLORIDE: 95 mmol/L — AB (ref 96–106)
CO2: 19 mmol/L — ABNORMAL LOW (ref 20–29)
Calcium: 9.1 mg/dL (ref 8.7–10.3)
Creatinine, Ser: 1.36 mg/dL — ABNORMAL HIGH (ref 0.57–1.00)
GFR calc non Af Amer: 37 mL/min/{1.73_m2} — ABNORMAL LOW (ref >59)
GFR, EST AFRICAN AMERICAN: 43 mL/min/{1.73_m2} — AB (ref >59)
GLUCOSE: 158 mg/dL — AB (ref 65–99)
Globulin, Total: 3 g/dL (ref 1.5–4.5)
POTASSIUM: 5.4 mmol/L — AB (ref 3.5–5.2)
Sodium: 129 mmol/L — ABNORMAL LOW (ref 134–144)
Total Protein: 7 g/dL (ref 6.0–8.5)

## 2017-11-18 LAB — CBC WITH DIFFERENTIAL/PLATELET
BASOS ABS: 0 10*3/uL (ref 0.0–0.2)
Basos: 1 %
EOS (ABSOLUTE): 0.4 10*3/uL (ref 0.0–0.4)
Eos: 7 %
HEMOGLOBIN: 9.2 g/dL — AB (ref 11.1–15.9)
Hematocrit: 28.2 % — ABNORMAL LOW (ref 34.0–46.6)
IMMATURE GRANS (ABS): 0 10*3/uL (ref 0.0–0.1)
Immature Granulocytes: 0 %
LYMPHS ABS: 1.5 10*3/uL (ref 0.7–3.1)
LYMPHS: 22 %
MCH: 26.3 pg — AB (ref 26.6–33.0)
MCHC: 32.6 g/dL (ref 31.5–35.7)
MCV: 81 fL (ref 79–97)
Monocytes Absolute: 0.5 10*3/uL (ref 0.1–0.9)
Monocytes: 7 %
NEUTROS ABS: 4.2 10*3/uL (ref 1.4–7.0)
Neutrophils: 63 %
PLATELETS: 366 10*3/uL (ref 150–450)
RBC: 3.5 x10E6/uL — AB (ref 3.77–5.28)
RDW: 14.2 % (ref 12.3–15.4)
WBC: 6.6 10*3/uL (ref 3.4–10.8)

## 2017-11-18 MED ORDER — HYDRALAZINE HCL 25 MG PO TABS: 12.5000 mg | ORAL_TABLET | Freq: Two times a day (BID) | ORAL | 3 refills | Status: DC

## 2017-11-18 MED ORDER — CARVEDILOL 3.125 MG PO TABS: ORAL_TABLET | ORAL | 3 refills | Status: DC

## 2017-11-18 NOTE — Patient Instructions (Addendum)
Medication Instructions:  INCREASE carvedilol (Coreg) to 1.5 tablet two times daily START hydralazine 12.5 mg (1/2 tablet) two times daily  Labwork: Please return for labs at appt on 6/6 (BMET, BNP)  Our in office lab hours are Monday-Friday 8:00-4:00, closed for lunch 12:45-1:45 pm.  No appointment needed.  Testing/Procedures: Your physician has requested that you have an echocardiogram in 4 weeks. Echocardiography is a painless test that uses sound waves to create images of your heart. It provides your doctor with information about the size and shape of your heart and how well your heart's chambers and valves are working. This procedure takes approximately one hour. There are no restrictions for this procedure.  This will be done at our [facility] location:  [address]  Follow-Up: 6/25 at 10:20 am with Dr. Tresa Endo     If you need a refill on your cardiac medications before your next appointment, please call your pharmacy.

## 2017-11-18 NOTE — Progress Notes (Signed)
Cardiology Office Note    Date:  11/20/2017   ID:  Jerald Kief Fayne Mcguffee, [place] Dec 05, 1940, MRN [id number]  PCP:  Mack Hook, MD  Cardiologist:  Shelva Majestic, MD   Initial office visit evaluation from me following her recent hospitalization and STEMI  History of Present Illness:  Louellen Haldeman is a 77 y.o. female who presented to [facility] on October 05, 2017 with an ST segment elevation anterior myocardial infarction with late presentation.  Her symptoms had developed the day prior to admission and became more progressive with reference to shortness of breath and continued chest pain.  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  She had 50% proximal OM1 stenosis followed by 95% distal marginal stenosis and there was mild irregularity of the RCA.  She underwent successful PCI to the LAD with ultimate insertion of a 2.5 x 26 mm Resolute DES stent postdilated 2.8 proximally and 2.68 distally with a 99% stenosis reduced to 0%.  The mid LAD stenosis was treated with DES stenting with a 2 to 5 x 12 mm Resolute stent.  The apical stenosis was treated with PTCA.  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  Her peak creatinine was 3.86 which improved to 1.88 at discharge.  He was ultimately discharged on October 24, 2017 and on Oct 28, 2017 so [name] for her initial office evaluation.  Patient has had issues with chronic cough.  Her pericardial symptoms had improved and she was treated with colchicine.  She denies recurrent chest pain symptomatology.  She had been tentatively scheduled to travel to [place] in several days.  She presents for reevaluation.   Past Medical History:  Diagnosis Date  . Acute combined systolic and diastolic CHF, NYHA class 3 ([place]) 10/10/2017   in setting of MI  . HOH (hard of hearing)   . Ischemic cardiomyopathy 10/05/2017  . Non-insulin dependent type 2 diabetes mellitus ([place]) 2004  . 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 STEMI involving left anterior descending coronary artery ([place]) 10/05/2017    Past Surgical History:  Procedure Laterality Date  . CORONARY/GRAFT ACUTE MI REVASCULARIZATION N/A 10/05/2017   Procedure: Coronary/Graft Acute MI Revascularization;  Surgeon: Troy Sine, MD;  Location: [facility];  Service: Cardiovascular;  Laterality: N/A;  . LEFT HEART CATH AND CORONARY ANGIOGRAPHY N/A 10/05/2017   Procedure: LEFT HEART CATH AND CORONARY ANGIOGRAPHY;  Surgeon: Troy Sine, MD;  Location: [facility];  Service: Cardiovascular;  Laterality: N/A;    Current Medications: No facility-administered medications prior to visit.    Outpatient Medications Prior to Visit  Medication Sig Dispense Refill  . aspirin 81 MG chewable tablet Chew 1 tablet (81 mg total) by mouth daily. 60 tablet 2  . atorvastatin (LIPITOR) 80 MG tablet Take 1 tablet (80 mg total) by mouth daily. 60 tablet 3  . chlorpheniramine-HYDROcodone (TUSSIONEX PENNKINETIC ER) 10-8 MG/5ML SUER Take 5 mLs by mouth at bedtime as needed for cough. 60 mL 0  . dicyclomine (BENTYL) 10 MG capsule Take 1 capsule (10 mg total) by mouth 3 (three) times daily before meals. 60 capsule 0  . furosemide (LASIX) 40 MG tablet 1 tab by mouth twice daily. 60 tablet 11  . glimepiride (AMARYL) 2 MG tablet Take 1 tablet (2 mg total) by mouth daily with breakfast. 30 tablet  1  . isosorbide mononitrate (IMDUR) 30 MG 24 hr tablet Take 0.5 tablets (15 mg total) by mouth daily. 60 tablet 3  . linagliptin (TRADJENTA) 5 MG TABS tablet Take 1 tablet (5 mg total) by mouth daily. 30 tablet 3  . loratadine (CLARITIN) 10 MG tablet Take 1 tablet (10 mg total) by mouth daily. (Patient taking differently: Take 10 mg by mouth daily as needed for allergies. ) 60  tablet 2  . nitroGLYCERIN (NITROSTAT) 0.4 MG SL tablet Place 1 tablet (0.4 mg total) under the tongue every 5 (five) minutes x 3 doses as needed for chest pain. 25 tablet 0  . pantoprazole (PROTONIX) 40 MG tablet Take 1 tablet (40 mg total) by mouth 2 (two) times daily. 60 tablet 3  . potassium chloride (K-DUR) 10 MEQ tablet Take 1 tablet (10 mEq total) by mouth 2 (two) times daily. (Patient not taking: Reported on 11/19/2017) 60 tablet 11  . sucralfate (CARAFATE) 1 g tablet Take 1 tablet (1 g total) by mouth 4 (four) times daily -  with meals and at bedtime. 90 tablet 0  . ticagrelor (BRILINTA) 90 MG TABS tablet Take 1 tablet (90 mg total) by mouth 2 (two) times daily. 120 tablet 4  . carvedilol (COREG) 3.125 MG tablet Take 3.125 mg by mouth 2 (two) times daily with a meal.       Allergies:   Patient has no known allergies.   Social History   Socioeconomic History  . Marital status: Widowed    Spouse name: Not on file  . Number of children: 8  . Years of education: 26  . Highest education level: Not on file  Occupational History  . Not on file  Social Needs  . Financial resource strain: Not on file  . Food insecurity:    Worry: Not on file    Inability: Not on file  . Transportation needs:    Medical: Not on file    Non-medical: Not on file  Tobacco Use  . Smoking status: Never Smoker  . Smokeless tobacco: Never Used  Substance and Sexual Activity  . Alcohol use: Never    Frequency: Never  . Drug use: Never  . Sexual activity: Not Currently  Lifestyle  . Physical activity:    Days per week: Not on file    Minutes per session: Not on file  . Stress: Not on file  Relationships  . Social connections:    Talks on phone: Not on file    Gets together: Not on file    Attends religious service: Not on file    Active member of club or organization: Not on file    Attends meetings of clubs or organizations: Not on file    Relationship status: Not on file  Other Topics Concern    . Not on file  Social History Narrative   Living with daughter, Marisue [place] and her husband and her 3 sons.     Family History:  The patient's family history includes Alcohol abuse in her father.   ROS General: Negative; No fevers, chills, or night sweats;  HEENT: Negative; No changes in vision or hearing, sinus congestion, difficulty swallowing Pulmonary: Negative; No cough, wheezing, shortness of breath, hemoptysis Cardiovascular: See HPI GI: Negative; No nausea, vomiting, diarrhea, or abdominal pain GU: Negative; No dysuria, hematuria, or difficulty voiding Musculoskeletal: Negative; no myalgias, joint pain, or weakness Hematologic/Oncology: Negative; no easy bruising, bleeding Endocrine: Negative; no heat/cold intolerance; no diabetes Neuro: Negative; no changes  in balance, headaches Skin: Negative; No rashes or skin lesions Psychiatric: Negative; No behavioral problems, depression Sleep: Negative; No snoring, daytime sleepiness, hypersomnolence, bruxism, restless legs, hypnogognic hallucinations, no cataplexy Other comprehensive 14 point system review is negative.   PHYSICAL EXAM:   VS:  BP 118/62   Pulse 86   Ht 4' 10.5" (1.486 m)   Wt 112 lb (50.8 kg)   BMI 23.01 kg/m     Repeat blood pressure by me was 110/60 supine but dropped to 98/60 standing. Wt Readings from Last 3 Encounters:  11/20/17 108 lb 7.5 oz (49.2 kg)  11/18/17 112 lb (50.8 kg)  11/17/17 112 lb (50.8 kg)    General: Alert, oriented, no distress.  Skin: normal turgor, no rashes, warm and dry HEENT: Normocephalic, atraumatic. Pupils equal round and reactive to light; sclera anicteric; extraocular muscles intact; Fundi no hemorrhages or exudates. Nose without nasal septal hypertrophy Mouth/Parynx benign; Mallinpatti scale 3 Neck: No JVD, no carotid bruits; normal carotid upstroke Lungs: clear to ausculatation and percussion; no wheezing or rales Chest wall: without tenderness to palpitation Heart: PMI  not displaced, RRR, s1 s2 normal, 1/6 systolic murmur, no diastolic murmur, no rubs, gallops, thrills, or heaves Abdomen: soft, nontender; no hepatosplenomehaly, BS+; abdominal aorta nontender and not dilated by palpation. Back: no CVA tenderness Pulses 2+ Musculoskeletal: full range of motion, normal strength, no joint deformities Extremities: no clubbing cyanosis or edema, Homan's sign negative  Neurologic: grossly nonfocal; Cranial nerves grossly wnl Psychologic: Normal mood and affect   Studies/Labs Reviewed:   EKG:  EKG is ordered today.  ECG (independently read by me): Sinus rhythm at 86 bpm.  Left anterior hemiblock.  QS complex V1 through V5 consistent with anterior infarct.  Inferior Q waves suggestive of inferior MI.  Normal intervals.  No ectopy.  Recent Labs: BMP Latest Ref Rng & Units 11/20/2017 11/20/2017 11/19/2017  Glucose 65 - 99 mg/dL 120(H) - 110(H)  BUN 6 - 20 mg/dL 26(H) - 27(H)  Creatinine 0.44 - 1.00 mg/dL 1.54(H) 1.58(H) 1.43(H)  BUN/Creat Ratio 12 - 28 - - -  Sodium 135 - 145 mmol/L 126(L) - 127(L)  Potassium 3.5 - 5.1 mmol/L 4.0 - 4.4  Chloride 101 - 111 mmol/L 94(L) - 94(L)  CO2 22 - 32 mmol/L 21(L) - 25  Calcium 8.9 - 10.3 mg/dL 8.5(L) - 8.6(L)     Hepatic Function Latest Ref Rng & Units 11/19/2017 11/17/2017 10/31/2017  Total Protein 6.5 - 8.1 g/dL 6.4(L) 7.0 6.0(L)  Albumin 3.5 - 5.0 g/dL 3.0(L) 4.0 2.8(L)  AST 15 - 41 U/L _0 ALT 14 - 54 U/L 13(L) 12 15  Alk Phosphatase 38 - 126 U/L 59 72 73  Total Bilirubin 0.3 - 1.2 mg/dL 0.4 0.3 0.4  Bilirubin, Direct 0.1 - 0.5 mg/dL <0.1(L) - -    CBC Latest Ref Rng & Units 11/20/2017 11/20/2017 11/19/2017  WBC 4.0 - 10.5 K/uL 6.5 6.3 6.6  Hemoglobin 12.0 - 15.0 g/dL 8.4(L) 9.3(L) 8.8(L)  Hematocrit 36.0 - 46.0 % 26.0(L) 28.4(L) 27.1(L)  Platelets 150 - 400 K/uL 317 349 321   Lab Results  Component Value Date   MCV 81.3 11/20/2017   MCV 79.6 11/20/2017   MCV 80.4 11/19/2017   Lab Results  Component  Value Date   TSH 3.377 10/05/2017   Lab Results  Component Value Date   HGBA1C 6.6 (H) 10/05/2017     BNP    Component Value Date/Time   BNP 1,223.8 (H) 11/19/2017  1458    ProBNP No results found for: PROBNP   Lipid Panel     Component Value Date/Time   CHOL 161 10/06/2017 0256   TRIG 110 10/06/2017 0256   HDL 49 10/06/2017 0256   CHOLHDL 3.3 10/06/2017 0256   VLDL 22 10/06/2017 0256   LDLCALC 90 10/06/2017 0256     RADIOLOGY: Dg Chest 2 View  Result Date: 11/19/2017 CLINICAL DATA:  Abdominal pain and nausea. EXAM: CHEST - 2 VIEW COMPARISON:  10/31/2017 FINDINGS: Mildly enlarged cardiac silhouette. Calcific atherosclerotic disease and tortuosity of the aorta. Mild interstitial pulmonary edema. Bilateral small pleural effusions. Osseous structures are without acute abnormality. Soft tissues are grossly normal. IMPRESSION: Interstitial pulmonary edema with bilateral small pleural effusions. Mild enlargement of the cardiac silhouette. Calcific atherosclerotic disease of the aorta. Electronically Signed   By: Fidela Salisbury M.D.   On: 11/19/2017 15:55   Dg Chest 2 View  Result Date: 10/31/2017 CLINICAL DATA:  Epigastric pain radiating into the chest since yesterday. Recent MI with coronary stent placement. CHF. Nonsmoker. EXAM: CHEST - 2 VIEW COMPARISON:  PA and lateral chest x-ray of Oct 30, 2017 FINDINGS: The lungs are well-expanded. The interstitial markings remain increased. Patchy densities are present at both lung bases. Small amounts of pleural fluid are noted on the right. The cardiac silhouette is mildly enlarged. There is calcification in the wall of the aortic arch. The trachea is midline. The bony thorax exhibits no acute abnormality. IMPRESSION: CHF with mild interstitial edema. Bibasilar atelectasis. Small right and trace left pleural effusions. Thoracic aortic atherosclerosis. Electronically Signed   By: David  Martinique M.D.   On: 10/31/2017 16:21   Dg Chest 2  View  Result Date: 10/30/2017 CLINICAL DATA:  Shortness of breath EXAM: CHEST - 2 VIEW COMPARISON:  10/28/2017, 10/18/2017 FINDINGS: Small bilateral pleural effusions. Cardiomegaly with vascular congestion and moderate pulmonary edema. Bibasilar consolidations. Aortic atherosclerosis. No pneumothorax. IMPRESSION: 1. Cardiomegaly with vascular congestion and mild to moderate pulmonary edema. 2. Small pleural effusions with bibasilar atelectasis or pneumonia. Electronically Signed   By: Donavan Foil M.D.   On: 10/30/2017 03:50   Dg Chest 2 View  Result Date: 10/29/2017 CLINICAL DATA:  77 year old female with shortness of breath and cough for 1 week. EXAM: CHEST - 2 VIEW COMPARISON:  Chest x-ray 10/18/2017. FINDINGS: Patchy multifocal interstitial and airspace disease noted throughout the mid to lower lungs bilaterally. Small bilateral pleural effusions. No evidence of pulmonary edema. Heart size is normal. Upper mediastinal contours are within normal limits. Aortic atherosclerosis. IMPRESSION: 1. Findings are concerning for multilobar bronchopneumonia throughout the lung bases with associated small bilateral parapneumonic pleural effusions. 2. Aortic atherosclerosis. Electronically Signed   By: Vinnie Langton M.D.   On: 10/29/2017 09:43   Ct Head Wo Contrast  Result Date: 10/31/2017 CLINICAL DATA:  77 year old presenting with acute onset of occipital headache, nausea and vomiting, and acute mental status changes. EXAM: CT HEAD WITHOUT CONTRAST TECHNIQUE: Contiguous axial images were obtained from the base of the skull through the vertex without intravenous contrast. COMPARISON:  None. FINDINGS: Brain: Moderate cortical, deep and cerebellar atrophy. Remote strokes involving both cerebellar hemispheres. Mild changes of small vessel disease of the white matter diffusely. No mass lesion. No midline shift. No acute hemorrhage or hematoma. No extra-axial fluid collections. No evidence of acute infarction.  Vascular: Moderate BILATERAL carotid siphon and vertebral artery atherosclerosis. No hyperdense vessel. Skull: No skull fracture or other focal osseous abnormality involving the skull. Sinuses/Orbits: Visualized paranasal sinuses,  bilateral mastoid air cells and bilateral middle ear cavities well-aerated. Bony nasal septal deviation to the LEFT. Visualized orbits and globes normal in appearance. Other: None. IMPRESSION: 1. No acute intracranial abnormality. 2. Remote strokes involving both cerebellar hemispheres. 3. Moderate generalized atrophy and mild chronic microvascular ischemic changes of the white matter. Electronically Signed   By: Evangeline Dakin M.D.   On: 10/31/2017 16:20   Dg Ugi W/high Density W/kub  Result Date: 11/01/2017 CLINICAL DATA:  Abdominal pain concerning for peptic ulcer disease.Recent myocardial infarction with stenting that precludes EGD. EXAM: UPPER GI SERIES WITH KUB TECHNIQUE: After obtaining a scout radiograph a routine upper GI series was performed using thin and high density barium. FLUOROSCOPY TIME:  Fluoroscopy Time:  1 minutes 24 seconds Radiation Exposure Index (if provided by the fluoroscopic device): 13.6 Number of Acquired Spot Images: 6 COMPARISON:  Abdominal CT 10/12/2017 FINDINGS: Lateral pharyngeal imaging is negative for aspiration or diverticulum. No obstructive process. The esophagus has smooth mucosal contour and normal distensibility. Normal motility in the upright position. The stomach has normal shape, distensibility, and location. There is no ulcer, mass or fold thickening. No gastric outlet obstruction. Normal course of the duodenum with normal fold pattern. IMPRESSION: Normal upper GI.  No findings of gastritis or ulcer. Electronically Signed   By: Monte Fantasia M.D.   On: 11/01/2017 16:06     Additional studies/ records that were reviewed today include:  I reviewed her 3-week hospitalization records in detail.  I reviewed her follow-up office visit  with [name].  Laboratory catheterization studies, echo Doppler assessment were reviewed.    ASSESSMENT:    1. ST elevation myocardial infarction (STEMI), subsequent episode of care ([place])   2. 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 Acute on chronic systolic congestive heart failure ([place])   4. Essential hypertension   5. Ischemic cardiomyopathy   6. Medication management   7. Post-MI pericarditis ([place])   8. Chronic combined systolic (congestive) and diastolic (congestive) heart failure ([place])   9. Acute kidney insufficiency      PLAN:   Ms. Aylssa Herrig is a very pleasant 77 year old Hispanic female who presented with a late presentation anterior ST segment elevation MI.  She had an initial significant ischemic heart myopathy with an EF of 30% with grade 2 diastolic dysfunction and developed post infarct pericarditis treated with Decadron and colchicine and during her hospitalization developed significant hyponatremia, acute kidney injury, with ultimate improvement.  Her creatinine 2 weeks ago was 1.87 but yesterday was 1.36.  She is not on ACE/arm/and ultimately if her renal function remains stable and blood pressure is adequate she will be a candidate for Entresto.  However, her blood pressure today is low and drops orthostatically from 110 down to 98 from supine to standing position.  As a result, I do not feel she is a candidate for Entresto presently.  She has been on carvedilol 3.125 mg twice a day, furosemide 40 mg twice a day, in addition to isosorbide 15 mg.  With her LV dysfunction, I will try hydralazine 12.5 mg twice a day if blood pressure allows.  As long as she is not having significant edema it may be worthwhile to decrease furosemide to 40 mg daily.  She continues to be on dual antiplatelet therapy with aspirin and Plavix and is not having bleeding.  She is on atorvastatin 80 mg daily  for hyperlipidemia  with target LDL less than 70.  She has GERD controlled on Protonix.  With reference to her diabetes she is now on Tradjenta and glimepiride.  I am scheduling her for a Bmet and BNP in 2 weeks.  In 4 weeks she will undergo a follow-up echo Doppler study to see if there has been any improvement in her previous ischemia mediated cardiomyopathy following revascularization.  I have written her a letter stating that she is not stable enough presently to fly to [place].  I will see her in 4 weeks for reevaluation. Medication Adjustments/Labs and Tests Ordered: Current medicines are reviewed at length with the patient today.  Concerns regarding medicines are outlined above.  Medication changes, Labs and Tests ordered today are listed in the Patient Instructions below. Patient Instructions  Medication Instructions:  INCREASE carvedilol (Coreg) to 1.5 tablet two times daily START hydralazine 12.5 mg (1/2 tablet) two times daily  Labwork: Please return for labs at appt on 6/6 (BMET, BNP)  Our in office lab hours are Monday-Friday 8:00-4:00, closed for lunch 12:45-1:45 pm.  No appointment needed.  Testing/Procedures: Your physician has requested that you have an echocardiogram in 4 weeks. Echocardiography is a painless test that uses sound waves to create images of your heart. It provides your doctor with information about the size and shape of your heart and how well your heart's chambers and valves are working. This procedure takes approximately one hour. There are no restrictions for this procedure.  This will be done at our [facility] location:  [place]: 6/25 at 10:20 am with Dr. Claiborne Billings     If you need a refill on your cardiac medications before your next appointment, please call your pharmacy.      Signed, Shelva Majestic, MD  11/20/2017 4:46 PM    [facility] [address] [place], [place], [place]   [address] Phone: [phone]

## 2017-11-19 ENCOUNTER — Inpatient Hospital Stay (HOSPITAL_COMMUNITY)
Admission: EM | Admit: 2017-11-19 | Discharge: 2017-11-22 | DRG: 313 | Disposition: A | Discharge status: Home Health | Payer: Medicaid Other | Attending: Family Medicine | Admitting: Family Medicine

## 2017-11-19 ENCOUNTER — Emergency Department (HOSPITAL_COMMUNITY): Payer: Medicaid Other

## 2017-11-19 ENCOUNTER — Encounter (HOSPITAL_COMMUNITY): Payer: Self-pay | Admitting: Emergency Medicine

## 2017-11-19 DIAGNOSIS — H9190 Unspecified hearing loss, unspecified ear: Secondary | ICD-10-CM | POA: Diagnosis present

## 2017-11-19 DIAGNOSIS — I252 Old myocardial infarction: Secondary | ICD-10-CM

## 2017-11-19 DIAGNOSIS — N182 Chronic kidney disease, stage 2 (mild): Secondary | ICD-10-CM | POA: Diagnosis present

## 2017-11-19 DIAGNOSIS — Z9119 Patient's noncompliance with other medical treatment and regimen: Secondary | ICD-10-CM

## 2017-11-19 DIAGNOSIS — E871 Hypo-osmolality and hyponatremia: Secondary | ICD-10-CM | POA: Diagnosis present

## 2017-11-19 DIAGNOSIS — E1122 Type 2 diabetes mellitus with diabetic chronic kidney disease: Secondary | ICD-10-CM | POA: Diagnosis present

## 2017-11-19 DIAGNOSIS — K219 Gastro-esophageal reflux disease without esophagitis: Secondary | ICD-10-CM | POA: Diagnosis present

## 2017-11-19 DIAGNOSIS — E1151 Type 2 diabetes mellitus with diabetic peripheral angiopathy without gangrene: Secondary | ICD-10-CM | POA: Diagnosis not present

## 2017-11-19 DIAGNOSIS — Z7982 Long term (current) use of aspirin: Secondary | ICD-10-CM | POA: Diagnosis not present

## 2017-11-19 DIAGNOSIS — I255 Ischemic cardiomyopathy: Secondary | ICD-10-CM | POA: Diagnosis present

## 2017-11-19 DIAGNOSIS — I25119 Atherosclerotic heart disease of native coronary artery with unspecified angina pectoris: Secondary | ICD-10-CM

## 2017-11-19 DIAGNOSIS — I5043 Acute on chronic combined systolic (congestive) and diastolic (congestive) heart failure: Secondary | ICD-10-CM | POA: Diagnosis present

## 2017-11-19 DIAGNOSIS — R079 Chest pain, unspecified: Principal | ICD-10-CM | POA: Diagnosis present

## 2017-11-19 DIAGNOSIS — I5022 Chronic systolic (congestive) heart failure: Secondary | ICD-10-CM

## 2017-11-19 DIAGNOSIS — Z7984 Long term (current) use of oral hypoglycemic drugs: Secondary | ICD-10-CM | POA: Diagnosis not present

## 2017-11-19 DIAGNOSIS — N179 Acute kidney failure, unspecified: Secondary | ICD-10-CM | POA: Diagnosis not present

## 2017-11-19 DIAGNOSIS — Z79899 Other long term (current) drug therapy: Secondary | ICD-10-CM

## 2017-11-19 DIAGNOSIS — Z8673 Personal history of transient ischemic attack (TIA), and cerebral infarction without residual deficits: Secondary | ICD-10-CM | POA: Diagnosis not present

## 2017-11-19 HISTORY — DX: Chest pain, unspecified: R07.9

## 2017-11-19 LAB — CBC
HCT: 27.1 % — ABNORMAL LOW (ref 36.0–46.0)
HEMOGLOBIN: 8.8 g/dL — AB (ref 12.0–15.0)
MCH: 26.1 pg (ref 26.0–34.0)
MCHC: 32.5 g/dL (ref 30.0–36.0)
MCV: 80.4 fL (ref 78.0–100.0)
Platelets: 321 10*3/uL (ref 150–400)
RBC: 3.37 MIL/uL — AB (ref 3.87–5.11)
RDW: 13.4 % (ref 11.5–15.5)
WBC: 6.6 10*3/uL (ref 4.0–10.5)

## 2017-11-19 LAB — URINALYSIS, ROUTINE W REFLEX MICROSCOPIC
BILIRUBIN URINE: NEGATIVE
Glucose, UA: NEGATIVE mg/dL
Hgb urine dipstick: NEGATIVE
KETONES UR: NEGATIVE mg/dL
Leukocytes, UA: NEGATIVE
NITRITE: NEGATIVE
PH: 6 (ref 5.0–8.0)
Protein, ur: NEGATIVE mg/dL
Specific Gravity, Urine: 1.005 (ref 1.005–1.030)

## 2017-11-19 LAB — BASIC METABOLIC PANEL
Anion gap: 8 (ref 5–15)
BUN: 27 mg/dL — ABNORMAL HIGH (ref 6–20)
CALCIUM: 8.6 mg/dL — AB (ref 8.9–10.3)
CO2: 25 mmol/L (ref 22–32)
Chloride: 94 mmol/L — ABNORMAL LOW (ref 101–111)
Creatinine, Ser: 1.43 mg/dL — ABNORMAL HIGH (ref 0.44–1.00)
GFR, EST AFRICAN AMERICAN: 39 mL/min — AB (ref >60)
GFR, EST NON AFRICAN AMERICAN: 34 mL/min — AB (ref >60)
Glucose, Bld: 110 mg/dL — ABNORMAL HIGH (ref 65–99)
Potassium: 4.4 mmol/L (ref 3.5–5.1)
Sodium: 127 mmol/L — ABNORMAL LOW (ref 135–145)

## 2017-11-19 LAB — LIPASE, BLOOD: LIPASE: 49 U/L (ref 11–51)

## 2017-11-19 LAB — HEPATIC FUNCTION PANEL
ALBUMIN: 3 g/dL — AB (ref 3.5–5.0)
ALT: 13 U/L — ABNORMAL LOW (ref 14–54)
AST: 19 U/L (ref 15–41)
Alkaline Phosphatase: 59 U/L (ref 38–126)
BILIRUBIN TOTAL: 0.4 mg/dL (ref 0.3–1.2)
Bilirubin, Direct: <0.1 mg/dL — ABNORMAL LOW (ref 0.1–0.5)
TOTAL PROTEIN: 6.4 g/dL — AB (ref 6.5–8.1)

## 2017-11-19 LAB — GLUCOSE, CAPILLARY: Glucose-Capillary: 127 mg/dL — ABNORMAL HIGH (ref 65–99)

## 2017-11-19 LAB — I-STAT TROPONIN, ED
TROPONIN I, POC: 0.03 ng/mL (ref 0.00–0.08)
Troponin i, poc: 0 ng/mL (ref 0.00–0.08)

## 2017-11-19 LAB — BRAIN NATRIURETIC PEPTIDE: B NATRIURETIC PEPTIDE 5: 1223.8 pg/mL — AB (ref 0.0–100.0)

## 2017-11-19 MED ORDER — INSULIN ASPART 100 UNIT/ML ~~LOC~~ SOLN: 0.0000 [IU] | Freq: Every day | SUBCUTANEOUS | Status: DC

## 2017-11-19 MED ORDER — ISOSORBIDE MONONITRATE ER 30 MG PO TB24
15.0000 mg | ORAL_TABLET | Freq: Every day | ORAL | Status: DC
  Administered 2017-11-20 – 2017-11-22 (×3): 15 mg via ORAL
  Filled 2017-11-19 (×3): qty 1

## 2017-11-19 MED ORDER — SUCRALFATE 1 G PO TABS
1.0000 g | ORAL_TABLET | Freq: Three times a day (TID) | ORAL | Status: DC
  Administered 2017-11-20 – 2017-11-22 (×7): 1 g via ORAL
  Filled 2017-11-19 (×9): qty 1

## 2017-11-19 MED ORDER — LORATADINE 10 MG PO TABS: 10.0000 mg | ORAL_TABLET | Freq: Every day | ORAL | Status: DC | PRN

## 2017-11-19 MED ORDER — DICYCLOMINE HCL 10 MG PO CAPS
10.0000 mg | ORAL_CAPSULE | Freq: Three times a day (TID) | ORAL | Status: DC
  Administered 2017-11-20 – 2017-11-22 (×7): 10 mg via ORAL
  Filled 2017-11-19 (×8): qty 1

## 2017-11-19 MED ORDER — PANTOPRAZOLE SODIUM 40 MG PO TBEC
40.0000 mg | DELAYED_RELEASE_TABLET | Freq: Two times a day (BID) | ORAL | Status: DC
  Administered 2017-11-20 – 2017-11-22 (×6): 40 mg via ORAL
  Filled 2017-11-19 (×6): qty 1

## 2017-11-19 MED ORDER — HEPARIN SODIUM (PORCINE) 5000 UNIT/ML IJ SOLN
5000.0000 [IU] | Freq: Three times a day (TID) | INTRAMUSCULAR | Status: DC
  Administered 2017-11-20 – 2017-11-22 (×9): 5000 [IU] via SUBCUTANEOUS
  Filled 2017-11-19 (×9): qty 1

## 2017-11-19 MED ORDER — FUROSEMIDE 40 MG PO TABS
40.0000 mg | ORAL_TABLET | Freq: Two times a day (BID) | ORAL | Status: DC
  Administered 2017-11-20 (×2): 40 mg via ORAL
  Filled 2017-11-19 (×2): qty 1

## 2017-11-19 MED ORDER — MORPHINE SULFATE (PF) 2 MG/ML IV SOLN: 2.0000 mg | INTRAVENOUS | Status: DC | PRN

## 2017-11-19 MED ORDER — GI COCKTAIL ~~LOC~~: 30.0000 mL | Freq: Four times a day (QID) | ORAL | Status: DC | PRN

## 2017-11-19 MED ORDER — ONDANSETRON HCL 4 MG/2ML IJ SOLN
4.0000 mg | Freq: Once | INTRAMUSCULAR | Status: DC
  Filled 2017-11-19: qty 2

## 2017-11-19 MED ORDER — ONDANSETRON HCL 4 MG/2ML IJ SOLN: 4.0000 mg | Freq: Four times a day (QID) | INTRAMUSCULAR | Status: DC | PRN

## 2017-11-19 MED ORDER — NITROGLYCERIN 0.4 MG SL SUBL: 0.4000 mg | SUBLINGUAL_TABLET | SUBLINGUAL | Status: DC | PRN

## 2017-11-19 MED ORDER — ATORVASTATIN CALCIUM 80 MG PO TABS
80.0000 mg | ORAL_TABLET | Freq: Every day | ORAL | Status: DC
  Administered 2017-11-20 – 2017-11-22 (×3): 80 mg via ORAL
  Filled 2017-11-19 (×3): qty 1

## 2017-11-19 MED ORDER — TICAGRELOR 90 MG PO TABS
90.0000 mg | ORAL_TABLET | Freq: Two times a day (BID) | ORAL | Status: DC
  Administered 2017-11-20 – 2017-11-22 (×6): 90 mg via ORAL
  Filled 2017-11-19 (×6): qty 1

## 2017-11-19 MED ORDER — ALPRAZOLAM 0.25 MG PO TABS: 0.2500 mg | ORAL_TABLET | Freq: Two times a day (BID) | ORAL | Status: DC | PRN

## 2017-11-19 MED ORDER — ASPIRIN 81 MG PO CHEW
81.0000 mg | CHEWABLE_TABLET | Freq: Every day | ORAL | Status: DC
  Administered 2017-11-20 – 2017-11-22 (×3): 81 mg via ORAL
  Filled 2017-11-19 (×3): qty 1

## 2017-11-19 MED ORDER — CARVEDILOL 3.125 MG PO TABS
4.6875 mg | ORAL_TABLET | Freq: Two times a day (BID) | ORAL | Status: DC
  Administered 2017-11-20 – 2017-11-22 (×6): 4.6875 mg via ORAL
  Filled 2017-11-19 (×7): qty 2

## 2017-11-19 MED ORDER — INSULIN ASPART 100 UNIT/ML ~~LOC~~ SOLN
0.0000 [IU] | Freq: Three times a day (TID) | SUBCUTANEOUS | Status: DC
  Administered 2017-11-20 – 2017-11-21 (×2): 2 [IU] via SUBCUTANEOUS
  Administered 2017-11-22 (×2): 1 [IU] via SUBCUTANEOUS

## 2017-11-19 NOTE — ED Notes (Signed)
Pt states she feels much better now and is not nauseated any longer. Daughter is now at bedside, daughter states she began having upper abd pain this morning and then started breathing "heavy" which is what concerned her and made her call ems.

## 2017-11-19 NOTE — Consult Note (Signed)
Cardiology Consultation:   Patient ID: Sheila White; [id number]; 04/07/1941   Admit date: 11/19/2017 Date of Consult: 11/19/2017  Primary Care Provider: Julieanne Manson, MD Primary Cardiologist: Tresa Endo   Patient Profile:   Sheila White is a 77 y.o. female with a hx of NIDDM, HOH, admit 10/05/2017 w/ STEMI, late presentation s/p DES LA, acuteS-D-CHF w/ EF 30% w/ grade 2 dd, AKI,hyponatremia, abd pain, anemia, weakness, EF 30% with grade 2 diastolic, postinfarction pericarditis, who is being seen today for the evaluation of chest pain at the request of Sophia Caccavale.  History of Present Illness:   Sheila White NIDDM, [facility], admit 10/05/2017 w/ STEMI, late presentation s/p DES LA, acuteS-D-CHF w/ EF 30% w/ grade 2 dd, AKI,hyponatremia, abd pain, anemia, weakness, EF 30% with grade 2 diastolic, postinfarction pericarditis, who presents with chest pain.  The patient has a history of late presenting STEMI in April 2019 that was treated with PCI to the LAD. She was rehospitalized earlier this month with acute on chronic HFrEF. She was seen by Dr. Tresa Endo, her primary cardiologist, yesterday. Repeat echocardiogram was ordered.  She presented back to the ED today after another episode of chest discomfort with dyspnea. This episode came on spontaneously at rest and improved when her daughter put her in an air conditioned room. Her symptoms had resolved by the time of ED evaluation. In the ED, troponin negative x1. BNP 1223 (last 1648). ECG with NSR and inferolateral Q waves and stable repolarization changes in mid-precordial leads. She was admitted to the hospitalist team.   On my evaluation, the patient denies any chest pain or dyspnea. She complains of some pain but no edema in her legs. She denies other complaints. She states that she has not been taking her furosemide regularly because it "burns" but reports compliance with her other medications including her  DAPT.    Past Medical History:  Diagnosis Date  . Acute combined systolic and diastolic CHF, NYHA class 3 (HCC) 10/10/2017   in setting of MI  . HOH (hard of hearing)   . Ischemic cardiomyopathy 10/05/2017  . Non-insulin dependent type 2 diabetes mellitus (HCC) 2004  . 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 STEMI involving left anterior descending coronary artery (HCC) 10/05/2017    Past Surgical History:  Procedure Laterality Date  . CORONARY/GRAFT ACUTE MI REVASCULARIZATION N/A 10/05/2017   Procedure: Coronary/Graft Acute MI Revascularization;  Surgeon: Lennette Bihari, MD;  Location: [facility] INVASIVE CV LAB;  Service: Cardiovascular;  Laterality: N/A;  . LEFT HEART CATH AND CORONARY ANGIOGRAPHY N/A 10/05/2017   Procedure: LEFT HEART CATH AND CORONARY ANGIOGRAPHY;  Surgeon: Lennette Bihari, MD;  Location: [facility] INVASIVE CV LAB;  Service: Cardiovascular;  Laterality: N/A;     Home Medications:  Prior to Admission medications   Medication Sig Start Date End Date Taking? Authorizing Provider  aspirin 81 MG chewable tablet Chew 1 tablet (81 mg total) by mouth daily. 10/25/17  Yes Georgie Chard D, NP  atorvastatin (LIPITOR) 80 MG tablet Take 1 tablet (80 mg total) by mouth daily. 10/24/17  Yes Georgie Chard D, NP  carvedilol (COREG) 3.125 MG tablet Take 1.5 tablet two times daily 11/18/17  Yes Lennette Bihari, MD  chlorpheniramine-HYDROcodone [facility] PENNKINETIC ER) 10-8 MG/5ML SUER Take 5 mLs by mouth at bedtime as needed for cough. 10/30/17  Yes Rolland Porter, MD  dicyclomine (BENTYL) 10 MG capsule Take 1 capsule (10 mg total) by mouth 3 (three) times daily  before meals. 11/03/17  Yes Alwyn Ren, MD  furosemide (LASIX) 40 MG tablet 1 tab by mouth twice daily. 11/17/17  Yes Julieanne Manson, MD  glimepiride (AMARYL) 2 MG tablet Take 1 tablet (2 mg total) by mouth daily with breakfast. 10/25/17  Yes Lennette Bihari, MD  isosorbide mononitrate (IMDUR) 30 MG 24 hr tablet Take 0.5  tablets (15 mg total) by mouth daily. 10/25/17  Yes Georgie Chard D, NP  linagliptin (TRADJENTA) 5 MG TABS tablet Take 1 tablet (5 mg total) by mouth daily. 10/24/17  Yes Georgie Chard D, NP  loratadine (CLARITIN) 10 MG tablet Take 1 tablet (10 mg total) by mouth daily. Patient taking differently: Take 10 mg by mouth daily as needed for allergies.  10/25/17  Yes Georgie Chard D, NP  pantoprazole (PROTONIX) 40 MG tablet Take 1 tablet (40 mg total) by mouth 2 (two) times daily. 10/24/17  Yes Georgie Chard D, NP  sucralfate (CARAFATE) 1 g tablet Take 1 tablet (1 g total) by mouth 4 (four) times daily -  with meals and at bedtime. 11/03/17  Yes Alwyn Ren, MD  ticagrelor (BRILINTA) 90 MG TABS tablet Take 1 tablet (90 mg total) by mouth 2 (two) times daily. 10/24/17  Yes Georgie Chard D, NP  hydrALAZINE (APRESOLINE) 25 MG tablet Take 0.5 tablets (12.5 mg total) by mouth 2 (two) times daily. Patient not taking: Reported on 11/19/2017 11/18/17 02/16/18  Lennette Bihari, MD  nitroGLYCERIN (NITROSTAT) 0.4 MG SL tablet Place 1 tablet (0.4 mg total) under the tongue every 5 (five) minutes x 3 doses as needed for chest pain. 10/24/17   Georgie Chard D, NP  potassium chloride (K-DUR) 10 MEQ tablet Take 1 tablet (10 mEq total) by mouth 2 (two) times daily. Patient not taking: Reported on 11/19/2017 11/17/17   Julieanne Manson, MD    Inpatient Medications: Scheduled Meds: . ondansetron (ZOFRAN) IV  4 mg Intravenous Once   Continuous Infusions:  PRN Meds:   Allergies:   No Known Allergies  Social History:   Social History   Socioeconomic History  . Marital status: Widowed    Spouse name: Not on file  . Number of children: 8  . Years of education: 71  . Highest education level: Not on file  Occupational History  . Not on file  Social Needs  . Financial resource strain: Not on file  . Food insecurity:    Worry: Not on file    Inability: Not on file  . Transportation needs:    Medical:  Not on file    Non-medical: Not on file  Tobacco Use  . Smoking status: Never Smoker  . Smokeless tobacco: Never Used  Substance and Sexual Activity  . Alcohol use: Not Currently  . Drug use: Never  . Sexual activity: Not Currently  Lifestyle  . Physical activity:    Days per week: Not on file    Minutes per session: Not on file  . Stress: Not on file  Relationships  . Social connections:    Talks on phone: Not on file    Gets together: Not on file    Attends religious service: Not on file    Active member of club or organization: Not on file    Attends meetings of clubs or organizations: Not on file    Relationship status: Not on file  . Intimate partner violence:    Fear of current or ex partner: Not on file    Emotionally abused:  Not on file    Physically abused: Not on file    Forced sexual activity: Not on file  Other Topics Concern  . Not on file  Social History Narrative   Living with daughter, Wray Kearns and her husband and her 3 sons.    Family History:    Family History  Problem Relation Age of Onset  . Alcohol abuse Father      ROS:  Please see the history of present illness.  All other ROS reviewed and negative.     Physical Exam/Data:   Vitals:   11/19/17 2015 11/19/17 2030 11/19/17 2045 11/19/17 2100  BP: 120/60 119/68 106/64 (!) 120/53  Pulse: 79 81 77 82  Resp: (!) 22  Temp:      TempSrc:      SpO2: 97% 97% 96% 98%    General:  Well nourished, well developed, in no acute distress  HEENT: normal Lymph: no adenopathy Neck: JVD 1-2 cm above clavicle Cardiac:  normal S1, S2; RRR; no murmur  Lungs:  clear to auscultation bilaterally, no wheezing, rhonchi or rales  Abd: soft, nontender, no hepatomegaly  Ext: no edema Musculoskeletal:  No deformities, BUE and BLE strength normal and equal Skin: warm and dry  Neuro:  No focal abnormalities noted Psych:  Normal affect   EKG:  The EKG was personally reviewed and demonstrates: NSR and  inferolateral Q waves and stable repolarization changes in mid-precordial leads. Telemetry:  Telemetry is ordered.  Relevant CV Studies: ECHO: 10/06/17 Study Conclusions - Left ventricle: The cavity size was normal. There was mild focal basal hypertrophy of the septum. Mid to apical inferoseptal/anteroseptal akinesis, apical lateral akinesis, mid to apical anterior akinesis, apical inferior akinesis, akinesis of the true apex. The estimated ejection fraction was 30%. Features are consistent with a pseudonormal left ventricular filling pattern, with concomitant abnormal relaxation and increased filling pressure (grade 2 diastolic dysfunction). - Aortic valve: Trileaflet; moderately calcified leaflets. Sclerosis without stenosis. There was trivial regurgitation. - Mitral valve: Mildly calcified annulus. There was trivial regurgitation. - Right ventricle: The cavity size was normal. Systolic function was normal. - Pulmonary arteries: No complete TR doppler jet so unable to estimate PA systolic pressure. - Inferior vena cava: The vessel was normal in size. The respirophasic diameter changes were in the normal range (= 50%), consistent with normal central venous pressure. - Pericardium, extracardiac: A trivial pericardial effusion was identified. Impressions: - Normal LV size with mild focal basal septal hypertrophy. EF 30% with wall motion abnormalities as noted above in LAD distribution. Normal RV size and systolic function. Aortic valve sclerosis without significant stenosis.   CATH: 10/05/17 Conclusion    Mid LAD lesion is 90% stenosed.  Prox LAD lesion is 99% stenosed.  Dist LAD lesion is 99% stenosed.  1st Mrg lesion is 95% stenosed.  Ost 2nd Mrg to 2nd Mrg lesion is 30% stenosed.  Ost RPDA to RPDA lesion is 30% stenosed.  Post intervention, there is a 0% residual stenosis.  Post intervention, there is a 0% residual  stenosis.  Post intervention, there is a 35% residual stenosis.  A stent was successfully placed.  A stent was successfully placed. Acute late presentation anterior ST segment elevation myocardial infarction with demonstration of subtotal long proximal LAD stenosis, 90% mid stenosis, and 99% apical stenosis with reduced apical flow. Left circumflex disease with 50% proximal OM1 stenosis followed by distal 95% marginal stenosis prior to giving off distal branches in the OM1 vessel.  Mild diffuse irregularity of a large dominant RCA with 30% narrowing in the PDA vessel. LVEDP 34 mmHg/ Successful PCI to the LAD with ultimate insertion of a 2.526 mm Resolute DES stent postdilated to 2.8 mm proximally and 2.68 mm distally with the 99% stenosis being reduced to 0%; DES stenting of the mid 90% stenosis with ultimate insertion of a 2.2512 mm Resolute DES stent with residual narrowing at 0%, and initial 99% apical thrombotic stenosis with reduced flow treated with low-level PTCA and intracoronary verapamil with improvement to approximately 35-40% in a very small apical LAD segment. RECOMMENDATION: DAPT for minimum of 1 year. Diuresis with elevated EDP. An echo Doppler study will be obtained in a.m. Patient should be started on low-dose ACE inhibition, carvedilol, probable nitrate therapy, and high potency statin treatment.  Post-Intervention Diagram          Laboratory Data:  Chemistry Recent Labs  Lab 11/17/17 1715 11/19/17 1458  NA 129* 127*  K 5.4* 4.4  CL 95* 94*  CO2 19* 25  GLUCOSE 158* 110*  BUN 27 27*  CREATININE 1.36* 1.43*  CALCIUM 9.1 8.6*  GFRNONAA 37* 34*  GFRAA 43* 39*  ANIONGAP  --  8    Recent Labs  Lab 11/17/17 1715 11/19/17 1523  PROT 7.0 6.4*  ALBUMIN 4.0 3.0*  AST 15 19  ALT 12 13*  ALKPHOS 72 59  BILITOT 0.3 0.4   Hematology Recent Labs  Lab 11/17/17 1715 11/19/17 1458  WBC 6.6 6.6  RBC 3.50* 3.37*  HGB 9.2* 8.8*  HCT 28.2* 27.1*    MCV 81 80.4  MCH 26.3* 26.1  MCHC 32.6 32.5  RDW 14.2 13.4  PLT 366 321   Cardiac EnzymesNo results for input(s): TROPONINI in the last 168 hours.  Recent Labs  Lab 11/19/17 1509 11/19/17 1819  TROPIPOC 0.00 0.03    BNP Recent Labs  Lab 11/19/17 1458  BNP 1,223.8*    DDimer No results for input(s): DDIMER in the last 168 hours.  Radiology/Studies:  Dg Chest 2 View  Result Date: 11/19/2017 CLINICAL DATA:  Abdominal pain and nausea. EXAM: CHEST - 2 VIEW COMPARISON:  10/31/2017 FINDINGS: Mildly enlarged cardiac silhouette. Calcific atherosclerotic disease and tortuosity of the aorta. Mild interstitial pulmonary edema. Bilateral small pleural effusions. Osseous structures are without acute abnormality. Soft tissues are grossly normal. IMPRESSION: Interstitial pulmonary edema with bilateral small pleural effusions. Mild enlargement of the cardiac silhouette. Calcific atherosclerotic disease of the aorta. Electronically Signed   By: Ted Mcalpine M.D.   On: 11/19/2017 15:55    Assessment and Plan:   Chest pain CAD c/b STEMI s/p PCI to LAD 09/2017 The patient presents to the ED for chest pain, which has been ongoing and intermittent since her STEMI in April. ECG is largely unchanged from prior, and troponin is negative x1. She is currently chest pain free. Her presentation is therefore reassuring against acute MI. The cause of her symptoms may be due to ongoing ischemia from un-revascularized OM lesion vs symptomatic heart failure vs alterate etiology. At this time, recommend ongoing management of her heart failure with rule out of acute ischemia. -Monitor on telemetry -Continue to trend troponin -Repeat ECG for change in symptoms. -Continue ASA, ticagrelor -Continue atorvastatin -Continue carvedilol -Continue isosorbide  ICM with systolic congestive heart failure The patient has HFrEF associated with her CAD. She was recently hospitalized for heart failure. Her BNP is lower  than previous admission but is still elevated. She does not appear grossly overloaded  on exam. She has not been compliant with her furosemide, which will likely allow for further normalization of her volume status if taken regularly. -Repeat echocardiogram ordered -Continue home oral lasix. Can give IV dose if needed -Continue home carvedilol -Not on ACE/ARB due to renal insufficiency  CKD Cr at recent baseline  For questions or updates, please contact [facility] Please consult [email or web address] for contact info under Cardiology/STEMI.   Signed, Ernest Mallick, MD  11/19/2017 9:36 PM

## 2017-11-19 NOTE — ED Notes (Signed)
Admitting MD at bedside.

## 2017-11-19 NOTE — ED Triage Notes (Signed)
Pt arrives with [facility] with abd pain and nausea, with abnormal ekg, recent MI.

## 2017-11-19 NOTE — ED Provider Notes (Signed)
Medical screening examination/treatment/procedure(s) were conducted as a shared visit with non-physician practitioner(s) and myself.  I personally evaluated the patient during the encounter.  EKG Interpretation  Date/Time:  Saturday Nov 19 2017 14:46:56 EDT Ventricular Rate:  81 PR Interval:    QRS Duration: 81 QT Interval:  381 QTC Calculation: 443 R Axis:   -121 Text Interpretation:  Sinus rhythm Inferior infarct, old Probable anterior infarct, age indeterminate Lateral leads are also involved No significant change since last tracing Confirmed by Drema Pry [phone]) on 11/19/2017 2:50:36 PM Also confirmed by Drema Pry [phone]), editor Barbette Hair [phone])  on 11/19/2017 4:39:11 PM Patient continues to have chest pain since a heart attack in April.  She got the pain felt like she been punched in the chest and was breathing heavily.  Patient is alert and appropriate.  Heart regular no gross rub murmur gallop.  Lungs are grossly clear.  No peripheral edema with soft nontender calves.  Patient has been having ongoing chest pain and has significant hyponatremia.  I agree with plan of management.   Arby Barrette, MD 11/19/17 2036

## 2017-11-19 NOTE — ED Notes (Signed)
Paged cards to [facility]

## 2017-11-19 NOTE — H&P (Signed)
History and Physical   [facility] Admission History and Physical [employer], D.O.    Patient Name: Sheila White MR#: [id number] Date of Birth: 05/14/1941 Date of Admission: 11/19/2017  Referring MD/NP/PA: PA Alveria Apley Primary Care Physician: Julieanne Manson, MD  Chief Complaint:  Chief Complaint  Patient presents with  . Chest Pain  Please note the entire history is obtained from the patient's emergency department chart, emergency department provider and the patient's family who is at the bedside. Patient's personal history is limited by language barrier. Family is translating.    HPI: Sheila White is a 77 y.o. female with a known history of combined diastolic and systolic congestive heart failure, ischemic cardiomyopathy, CAD status post STEMI, post infarction pericarditis, DM 2, CK D presents to the emergency department for evaluation of chest pain.  Patient was in a usual state of health until April 2019 when she states sustained an MI. Since then she has had intermittent tightness in her central chest associated with dyspnea, nausea and worsening over the past few days.   Patient denies fevers/chills, weakness, dizziness, chest pain,  N/V/C/D, abdominal pain, dysuria/frequency, changes in mental status.    Otherwise there has been no change in status. Patient has been taking medication as prescribed and there has been no recent change in medication or diet.  No recent antibiotics.  There has been no recent illness, hospitalizations, travel or sick contacts.    EMS/ED Course: Patient received Zofran. Medical admission has been requested for further management of chest pain,, hyponatremia.  Review of Systems:  CONSTITUTIONAL: No fever/chills, fatigue, weakness, weight gain/loss, headache. EYES: No blurry or double vision. ENT: No tinnitus, postnasal drip, redness or soreness of the oropharynx. RESPIRATORY:positive  dyspnea. No cough, wheeze.  No hemoptysis.  CARDIOVASCULAR: positive chest pain, palpitations, negativesyncope, orthopnea. No lower extremity edema.  GASTROINTESTINAL: No nausea, vomiting, abdominal pain, diarrhea, constipation.  No hematemesis, melena or hematochezia. GENITOURINARY: No dysuria, frequency, hematuria. ENDOCRINE: No polyuria or nocturia. No heat or cold intolerance. HEMATOLOGY: No anemia, bruising, bleeding. INTEGUMENTARY: No rashes, ulcers, lesions. MUSCULOSKELETAL: No arthritis, gout. NEUROLOGIC: No numbness, tingling, ataxia, seizure-type activity, weakness. PSYCHIATRIC: No anxiety, depression, insomnia.   Past Medical History:  Diagnosis Date  . Acute combined systolic and diastolic CHF, NYHA class 3 (HCC) 10/10/2017   in setting of MI  . HOH (hard of hearing)   . Ischemic cardiomyopathy 10/05/2017  . Non-insulin dependent type 2 diabetes mellitus (HCC) 2004  . 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 STEMI involving left anterior descending coronary artery (HCC) 10/05/2017    Past Surgical History:  Procedure Laterality Date  . CORONARY/GRAFT ACUTE MI REVASCULARIZATION N/A 10/05/2017   Procedure: Coronary/Graft Acute MI Revascularization;  Surgeon: Lennette Bihari, MD;  Location: [facility] INVASIVE CV LAB;  Service: Cardiovascular;  Laterality: N/A;  . LEFT HEART CATH AND CORONARY ANGIOGRAPHY N/A 10/05/2017   Procedure: LEFT HEART CATH AND CORONARY ANGIOGRAPHY;  Surgeon: Lennette Bihari, MD;  Location: [facility] INVASIVE CV LAB;  Service: Cardiovascular;  Laterality: N/A;     reports that she has never smoked. She has never used smokeless tobacco. She reports that she drank alcohol. She reports that she does not use drugs.  No Known Allergies  Family History  Problem Relation Age of Onset  . Alcohol abuse Father     Prior to Admission medications   Medication Sig Start Date End Date Taking? Authorizing Provider  aspirin 81 MG chewable tablet  Chew 1 tablet (81 mg  total) by mouth daily. 10/25/17  Yes Georgie Chard D, NP  atorvastatin (LIPITOR) 80 MG tablet Take 1 tablet (80 mg total) by mouth daily. 10/24/17  Yes Georgie Chard D, NP  carvedilol (COREG) 3.125 MG tablet Take 1.5 tablet two times daily 11/18/17  Yes Lennette Bihari, MD  chlorpheniramine-HYDROcodone [facility] PENNKINETIC ER) 10-8 MG/5ML SUER Take 5 mLs by mouth at bedtime as needed for cough. 10/30/17  Yes Rolland Porter, MD  dicyclomine (BENTYL) 10 MG capsule Take 1 capsule (10 mg total) by mouth 3 (three) times daily before meals. 11/03/17  Yes Alwyn Ren, MD  furosemide (LASIX) 40 MG tablet 1 tab by mouth twice daily. 11/17/17  Yes Julieanne Manson, MD  glimepiride (AMARYL) 2 MG tablet Take 1 tablet (2 mg total) by mouth daily with breakfast. 10/25/17  Yes Lennette Bihari, MD  isosorbide mononitrate (IMDUR) 30 MG 24 hr tablet Take 0.5 tablets (15 mg total) by mouth daily. 10/25/17  Yes Georgie Chard D, NP  linagliptin (TRADJENTA) 5 MG TABS tablet Take 1 tablet (5 mg total) by mouth daily. 10/24/17  Yes Georgie Chard D, NP  loratadine (CLARITIN) 10 MG tablet Take 1 tablet (10 mg total) by mouth daily. Patient taking differently: Take 10 mg by mouth daily as needed for allergies.  10/25/17  Yes Georgie Chard D, NP  pantoprazole (PROTONIX) 40 MG tablet Take 1 tablet (40 mg total) by mouth 2 (two) times daily. 10/24/17  Yes Georgie Chard D, NP  sucralfate (CARAFATE) 1 g tablet Take 1 tablet (1 g total) by mouth 4 (four) times daily -  with meals and at bedtime. 11/03/17  Yes Alwyn Ren, MD  ticagrelor (BRILINTA) 90 MG TABS tablet Take 1 tablet (90 mg total) by mouth 2 (two) times daily. 10/24/17  Yes Georgie Chard D, NP  hydrALAZINE (APRESOLINE) 25 MG tablet Take 0.5 tablets (12.5 mg total) by mouth 2 (two) times daily. Patient not taking: Reported on 11/19/2017 11/18/17 02/16/18  Lennette Bihari, MD  nitroGLYCERIN (NITROSTAT) 0.4 MG SL tablet Place 1 tablet (0.4 mg total) under the tongue  every 5 (five) minutes x 3 doses as needed for chest pain. 10/24/17   Georgie Chard D, NP  potassium chloride (K-DUR) 10 MEQ tablet Take 1 tablet (10 mEq total) by mouth 2 (two) times daily. Patient not taking: Reported on 11/19/2017 11/17/17   Julieanne Manson, MD    Physical Exam: Vitals:   11/19/17 1800 11/19/17 1900 11/19/17 1915 11/19/17 1930  BP: 134/68 116/70 121/69 128/72  Pulse: 78 75 81 79  Resp: (!) 24 17 (!) 21 16  Temp:      TempSrc:      SpO2: 100% 100% 100% 100%    GENERAL: 77 y.o.-year-old female patient, chronically ill appearing, sitting up in the bed in no acute distress.  Pleasant and cooperative.   HEENT: Head atraumatic, normocephalic. Pupils equal. Mucus membranes moist. Echhymoses to right lower face.  NECK: Supple. No JVD. CHEST: Diminished breath sounds bilaterally. No wheezing, rales, rhonchi or crackles. No use of accessory muscles of respiration.  No reproducible chest wall tenderness.  CARDIOVASCULAR: S1, S2 normal. No murmurs, rubs, or gallops. Cap refill <2 seconds. Pulses intact distally.  ABDOMEN: Soft, nondistended, nontender. No rebound, guarding, rigidity. Normoactive bowel sounds present in all four quadrants.  EXTREMITIES: No pedal edema, cyanosis, or clubbing. No calf tenderness or Homan's sign.  NEUROLOGIC: The patient is alert and oriented x 3. Cranial nerves  II through XII are grossly intact with no focal sensorimotor deficit. SKIN: Warm, dry, and intact without obvious rash, lesion, or ulcer.    Labs on Admission:  CBC: Recent Labs  Lab 11/17/17 1715 11/19/17 1458  WBC 6.6 6.6  NEUTROABS 4.2  --   HGB 9.2* 8.8*  HCT 28.2* 27.1*  MCV 81 80.4  PLT 366 321   Basic Metabolic Panel: Recent Labs  Lab 11/17/17 1715 11/19/17 1458  NA 129* 127*  K 5.4* 4.4  CL 95* 94*  CO2 19* 25  GLUCOSE 158* 110*  BUN 27 27*  CREATININE 1.36* 1.43*  CALCIUM 9.1 8.6*   GFR: Estimated Creatinine Clearance: 23 mL/min (A) (by C-G formula based  on SCr of 1.43 mg/dL (H)). Liver Function Tests: Recent Labs  Lab 11/17/17 1715 11/19/17 1523  AST 15 19  ALT 12 13*  ALKPHOS 72 59  BILITOT 0.3 0.4  PROT 7.0 6.4*  ALBUMIN 4.0 3.0*   Recent Labs  Lab 11/19/17 1521  LIPASE 49   No results for input(s): AMMONIA in the last 168 hours. Coagulation Profile: No results for input(s): INR, PROTIME in the last 168 hours. Cardiac Enzymes: No results for input(s): CKTOTAL, CKMB, CKMBINDEX, TROPONINI in the last 168 hours. BNP (last 3 results) No results for input(s): PROBNP in the last 8760 hours. HbA1C: No results for input(s): HGBA1C in the last 72 hours. CBG: No results for input(s): GLUCAP in the last 168 hours. Lipid Profile: No results for input(s): CHOL, HDL, LDLCALC, TRIG, CHOLHDL, LDLDIRECT in the last 72 hours. Thyroid Function Tests: No results for input(s): TSH, T4TOTAL, FREET4, T3FREE, THYROIDAB in the last 72 hours. Anemia Panel: No results for input(s): VITAMINB12, FOLATE, FERRITIN, TIBC, IRON, RETICCTPCT in the last 72 hours. Urine analysis:    Component Value Date/Time   COLORURINE YELLOW 10/31/2017 1920   APPEARANCEUR HAZY (A) 10/31/2017 1920   LABSPEC 1.005 10/31/2017 1920   PHURINE 6.0 10/31/2017 1920   GLUCOSEU NEGATIVE 10/31/2017 1920   HGBUR MODERATE (A) 10/31/2017 1920   BILIRUBINUR neg 11/17/2017 1728   KETONESUR NEGATIVE 10/31/2017 1920   PROTEINUR Negative 11/17/2017 1728   PROTEINUR NEGATIVE 10/31/2017 1920   UROBILINOGEN 0.2 11/17/2017 1728   NITRITE neg 11/17/2017 1728   NITRITE NEGATIVE 10/31/2017 1920   LEUKOCYTESUR Negative 11/17/2017 1728   Sepsis Labs: (procalcitonin:4,lacticidven:4) )No results found for this or any previous visit (from the past 240 hour(s)).   Radiological Exams on Admission: Dg Chest 2 View  Result Date: 11/19/2017 CLINICAL DATA:  Abdominal pain and nausea. EXAM: CHEST - 2 VIEW COMPARISON:  10/31/2017 FINDINGS: Mildly enlarged cardiac silhouette.  Calcific atherosclerotic disease and tortuosity of the aorta. Mild interstitial pulmonary edema. Bilateral small pleural effusions. Osseous structures are without acute abnormality. Soft tissues are grossly normal. IMPRESSION: Interstitial pulmonary edema with bilateral small pleural effusions. Mild enlargement of the cardiac silhouette. Calcific atherosclerotic disease of the aorta. Electronically Signed   By: Ted Mcalpine M.D.   On: 11/19/2017 15:55    EKG: Normal sinus rhythm at 81 bpm with normal axis and nonspecific ST-T wave changes.   Assessment/Plan  This is a 77 y.o. female with a history of combined diastolic and systolic congestive heart failure, ischemic cardiomyopathy, CAD status post STEMI, post infarction pericarditis, DM 2, CK D now being admitted with:  #. Chest pain, rule out ACS - Admit to inpatient with telemetry monitoring. - Trend troponins, check lipids and TSH. - Morphine, nitro, Coreg, aspirin and Lipitor ordered.   -continue  [facility] - Cardiology consultation has been requested by EDP.  #. Hyponatremia - Fluid restriction - Repeat BMP in AM  #. H/o Diabetes - Accuchecks achs with RISS coverage - Heart healthy, carb controlled diet - hold glimepiride, linagliptin  #. History of CHF - Continue Coreg, Lasix, Imdur  #. History of GERD - Continue Protonix  #. History of CKD - Monitor BMP  Admission status: inpatient, telemetry IV Fluids: Hep-Lock Diet/Nutrition: heart healthy carb controlled Consults called: cardio  DVT Px: heparin, SCDs and early ambulation. Code Status: Full Code  Disposition Plan: To home in 1-2 days  All the records are reviewed and case discussed with ED provider. Management plans discussed with the patient and/or family who express understanding and agree with plan of care.  Telesa Jeancharles D.O. on 11/19/2017 at 8:24 PM CC: Primary care physician; Julieanne Manson, MD   11/19/2017, 8:24 PM

## 2017-11-19 NOTE — ED Notes (Signed)
Admitting doc @ bedside

## 2017-11-19 NOTE — ED Notes (Signed)
Pt transported to Xray. 

## 2017-11-19 NOTE — ED Provider Notes (Signed)
[facility] EMERGENCY DEPARTMENT Provider Note   CSN: [id number] Arrival date & time: 11/19/17  1443     History   Chief Complaint Chief Complaint  Patient presents with  . Chest Pain    HPI Eddye Broxterman Umana Clarisa Fling is a 77 y.o. female presenting for evaluation of chest pain.  Patient states she has been having intermittent chest pain since her heart attack in April.  Today, she felt like somebody punched her in her upper abdomen/chest.  She started breathing heavily, which made her come to the ER.  She reports cough for the past several days.  She is associated nausea without vomiting.  She denies fevers, chills, current shortness of breath, abdominal pain, or urinary symptoms.  She reports abnormal bowel movements over the past 4 days, stating that she is putting out large balls.  This is abnormal for her.  Per chart review, she has a history of MI, CHF, and diabetes.  Patient states she has not been taking her medication as prescribed, although looking at the primary care note from 2 days ago, patient stated that she was. Pt follows with Dr. Tresa Endo from cardiology.   HPI  Past Medical History:  Diagnosis Date  . Acute combined systolic and diastolic CHF, NYHA class 3 (HCC) 10/10/2017   in setting of MI  . HOH (hard of hearing)   . Ischemic cardiomyopathy 10/05/2017  . Non-insulin dependent type 2 diabetes mellitus (HCC) 2004  . 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 STEMI involving left anterior descending coronary artery (HCC) 10/05/2017    Patient Active Problem List   Diagnosis Date Noted  . Chest pain, rule out acute myocardial infarction 11/19/2017  . Epigastric abdominal pain   . Renal insufficiency 10/31/2017  . Normocytic anemia 10/31/2017  . Coronary artery disease 10/31/2017  . Chest pain 10/31/2017  . Acute lower UTI 10/31/2017  . Acute cystitis without hematuria   . Cough   . Hyponatremia   . Acute on chronic systolic  congestive heart failure (HCC) 10/20/2017  . Abdominal pain   . Acute combined systolic and diastolic CHF, NYHA class 3 (HCC) 10/10/2017  . MI, acute, non ST segment elevation (HCC)   . Ischemic cardiomyopathy   . STEMI (ST elevation myocardial infarction) (HCC) 10/05/2017  . Dyspnea 10/05/2017  . HOH (hard of hearing) 10/05/2017  . STEMI involving left anterior descending coronary artery (HCC) 10/05/2017  . Non-insulin dependent type 2 diabetes mellitus (HCC) 06/28/2002    Past Surgical History:  Procedure Laterality Date  . CORONARY/GRAFT ACUTE MI REVASCULARIZATION N/A 10/05/2017   Procedure: Coronary/Graft Acute MI Revascularization;  Surgeon: Lennette Bihari, MD;  Location: [facility] INVASIVE CV LAB;  Service: Cardiovascular;  Laterality: N/A;  . LEFT HEART CATH AND CORONARY ANGIOGRAPHY N/A 10/05/2017   Procedure: LEFT HEART CATH AND CORONARY ANGIOGRAPHY;  Surgeon: Lennette Bihari, MD;  Location: [facility] INVASIVE CV LAB;  Service: Cardiovascular;  Laterality: N/A;     OB History   None      Home Medications    Prior to Admission medications   Medication Sig Start Date End Date Taking? Authorizing Provider  aspirin 81 MG chewable tablet Chew 1 tablet (81 mg total) by mouth daily. 10/25/17  Yes Georgie Chard D, NP  atorvastatin (LIPITOR) 80 MG tablet Take 1 tablet (80 mg total) by mouth daily. 10/24/17  Yes Georgie Chard D, NP  carvedilol (COREG) 3.125 MG tablet Take 1.5 tablet two times daily 11/18/17  Yes Nicki Guadalajara  A, MD  chlorpheniramine-HYDROcodone (TUSSIONEX PENNKINETIC ER) 10-8 MG/5ML SUER Take 5 mLs by mouth at bedtime as needed for cough. 10/30/17  Yes Rolland Porter, MD  dicyclomine (BENTYL) 10 MG capsule Take 1 capsule (10 mg total) by mouth 3 (three) times daily before meals. 11/03/17  Yes Alwyn Ren, MD  furosemide (LASIX) 40 MG tablet 1 tab by mouth twice daily. 11/17/17  Yes Julieanne Manson, MD  glimepiride (AMARYL) 2 MG tablet Take 1 tablet (2 mg total) by mouth daily  with breakfast. 10/25/17  Yes Lennette Bihari, MD  isosorbide mononitrate (IMDUR) 30 MG 24 hr tablet Take 0.5 tablets (15 mg total) by mouth daily. 10/25/17  Yes Georgie Chard D, NP  linagliptin (TRADJENTA) 5 MG TABS tablet Take 1 tablet (5 mg total) by mouth daily. 10/24/17  Yes Georgie Chard D, NP  loratadine (CLARITIN) 10 MG tablet Take 1 tablet (10 mg total) by mouth daily. Patient taking differently: Take 10 mg by mouth daily as needed for allergies.  10/25/17  Yes Georgie Chard D, NP  pantoprazole (PROTONIX) 40 MG tablet Take 1 tablet (40 mg total) by mouth 2 (two) times daily. 10/24/17  Yes Georgie Chard D, NP  sucralfate (CARAFATE) 1 g tablet Take 1 tablet (1 g total) by mouth 4 (four) times daily -  with meals and at bedtime. 11/03/17  Yes Alwyn Ren, MD  ticagrelor (BRILINTA) 90 MG TABS tablet Take 1 tablet (90 mg total) by mouth 2 (two) times daily. 10/24/17  Yes Georgie Chard D, NP  hydrALAZINE (APRESOLINE) 25 MG tablet Take 0.5 tablets (12.5 mg total) by mouth 2 (two) times daily. Patient not taking: Reported on 11/19/2017 11/18/17 02/16/18  Lennette Bihari, MD  nitroGLYCERIN (NITROSTAT) 0.4 MG SL tablet Place 1 tablet (0.4 mg total) under the tongue every 5 (five) minutes x 3 doses as needed for chest pain. 10/24/17   Georgie Chard D, NP  potassium chloride (K-DUR) 10 MEQ tablet Take 1 tablet (10 mEq total) by mouth 2 (two) times daily. Patient not taking: Reported on 11/19/2017 11/17/17   Julieanne Manson, MD    Family History Family History  Problem Relation Age of Onset  . Alcohol abuse Father     Social History Social History   Tobacco Use  . Smoking status: Never Smoker  . Smokeless tobacco: Never Used  Substance Use Topics  . Alcohol use: Never    Frequency: Never  . Drug use: Never     Allergies   Patient has no known allergies.   Review of Systems Review of Systems  Respiratory: Positive for shortness of breath (resolved).   Cardiovascular: Positive  for chest pain.  Gastrointestinal: Positive for abdominal pain (epigastric), constipation and nausea.  All other systems reviewed and are negative.    Physical Exam Updated Vital Signs BP 126/67 (BP Location: Left Arm)   Pulse 87   Temp (!) 97.5 F (36.4 C) (Oral)   Resp 20   Wt 49.9 kg (109 lb 14.4 oz)   SpO2 98%   BMI 22.58 kg/m   Physical Exam  Constitutional: She is oriented to person, place, and time. She appears well-developed and well-nourished. No distress.  Elderly female, appears chronically ill but in no acute distress  HENT:  Head: Normocephalic and atraumatic.  Eyes: Pupils are equal, round, and reactive to light. Conjunctivae and EOM are normal.  Neck: Normal range of motion. Neck supple.  Cardiovascular: Normal rate, regular rhythm and intact distal pulses.  Pulmonary/Chest: Effort  normal. No respiratory distress. She has decreased breath sounds in the left lower field. She has no wheezes. She has no rhonchi. She has no rales.  Speaking in full sentences. Decreased breath sounds in lower fields bilaterally.   Abdominal: Soft. She exhibits no distension and no mass. There is no tenderness. There is no guarding.  Musculoskeletal: Normal range of motion. She exhibits no edema.  No leg pain or swelling.  Radial and pedal pulses intact bilaterally.  Neurological: She is alert and oriented to person, place, and time.  Skin: Skin is warm and dry.  Psychiatric: She has a normal mood and affect.  Nursing note and vitals reviewed.    ED Treatments / Results  Labs (all labs ordered are listed, but only abnormal results are displayed) Labs Reviewed  BASIC METABOLIC PANEL - Abnormal; Notable for the following components:      Result Value   Sodium 127 (*)    Chloride 94 (*)    Glucose, Bld 110 (*)    BUN 27 (*)    Creatinine, Ser 1.43 (*)    Calcium 8.6 (*)    GFR calc non Af Amer 34 (*)    GFR calc Af Amer 39 (*)    All other components within normal limits    CBC - Abnormal; Notable for the following components:   RBC 3.37 (*)    Hemoglobin 8.8 (*)    HCT 27.1 (*)    All other components within normal limits  URINALYSIS, ROUTINE W REFLEX MICROSCOPIC - Abnormal; Notable for the following components:   Color, Urine STRAW (*)    All other components within normal limits  HEPATIC FUNCTION PANEL - Abnormal; Notable for the following components:   Total Protein 6.4 (*)    Albumin 3.0 (*)    ALT 13 (*)    Bilirubin, Direct <0.1 (*)    All other components within normal limits  BRAIN NATRIURETIC PEPTIDE - Abnormal; Notable for the following components:   B Natriuretic Peptide 1,223.8 (*)    All other components within normal limits  GLUCOSE, CAPILLARY - Abnormal; Notable for the following components:   Glucose-Capillary 127 (*)    All other components within normal limits  LIPASE, BLOOD  TROPONIN I  TROPONIN I  TROPONIN I  CBC  CREATININE, SERUM  I-STAT TROPONIN, ED  I-STAT TROPONIN, ED    EKG EKG Interpretation  Date/Time:  Saturday Nov 19 2017 14:46:56 EDT Ventricular Rate:  81 PR Interval:    QRS Duration: 81 QT Interval:  381 QTC Calculation: 443 R Axis:   -121 Text Interpretation:  Sinus rhythm Inferior infarct, old Probable anterior infarct, age indeterminate Lateral leads are also involved No significant change since last tracing Confirmed by Drema Pry [phone]) on 11/19/2017 2:50:36 PM Also confirmed by Drema Pry [phone]), editor Barbette Hair [phone])  on 11/19/2017 4:39:11 PM   Radiology Dg Chest 2 View  Result Date: 11/19/2017 CLINICAL DATA:  Abdominal pain and nausea. EXAM: CHEST - 2 VIEW COMPARISON:  10/31/2017 FINDINGS: Mildly enlarged cardiac silhouette. Calcific atherosclerotic disease and tortuosity of the aorta. Mild interstitial pulmonary edema. Bilateral small pleural effusions. Osseous structures are without acute abnormality. Soft tissues are grossly normal. IMPRESSION: Interstitial pulmonary edema with  bilateral small pleural effusions. Mild enlargement of the cardiac silhouette. Calcific atherosclerotic disease of the aorta. Electronically Signed   By: Ted Mcalpine M.D.   On: 11/19/2017 15:55    Procedures Procedures (including critical care time)  Medications Ordered in ED  Medications  ondansetron (ZOFRAN) injection 4 mg (4 mg Intravenous Not Given 11/19/17 1546)  aspirin chewable tablet 81 mg (has no administration in time range)  atorvastatin (LIPITOR) tablet 80 mg (has no administration in time range)  carvedilol (COREG) tablet 4.6875 mg (4.6875 mg Oral Given 11/20/17 0031)  dicyclomine (BENTYL) capsule 10 mg (has no administration in time range)  furosemide (LASIX) tablet 40 mg (has no administration in time range)  isosorbide mononitrate (IMDUR) 24 hr tablet 15 mg (has no administration in time range)  loratadine (CLARITIN) tablet 10 mg (has no administration in time range)  nitroGLYCERIN (NITROSTAT) SL tablet 0.4 mg (has no administration in time range)  pantoprazole (PROTONIX) EC tablet 40 mg (40 mg Oral Given 11/20/17 0032)  sucralfate (CARAFATE) tablet 1 g (has no administration in time range)  ticagrelor (BRILINTA) tablet 90 mg (90 mg Oral Given 11/20/17 0031)  heparin injection 5,000 Units (5,000 Units Subcutaneous Given 11/20/17 0031)  morphine 2 MG/ML injection 2 mg (has no administration in time range)  gi cocktail (Maalox,Lidocaine,Donnatal) (has no administration in time range)  ondansetron (ZOFRAN) injection 4 mg (has no administration in time range)  ALPRAZolam (XANAX) tablet 0.25 mg (has no administration in time range)  insulin aspart (novoLOG) injection 0-9 Units (has no administration in time range)  insulin aspart (novoLOG) injection 0-5 Units (0 Units Subcutaneous Not Given 11/20/17 0033)     Initial Impression / Assessment and Plan / ED Course  I have reviewed the triage vital signs and the nursing notes.  Pertinent labs & imaging results that were  available during my care of the patient were reviewed by me and considered in my medical decision making (see chart for details).     Patient presenting for evaluation of chest pain/pressure, shortness of breath, nausea.  Physical exam shows elderly female in no distress.  Shortness of breath has resolved.  Chest pain appears consistent with baseline.  Patient with a heart attack April 10, followed up with cardiology yesterday with reassuring as it.  On further evaluation, patient dates she is not taking her Lasix, because she does not like the way it makes her feel.  EKG without STEMI, shows improving ST changes from previous.  Troponin negative x2.  Labs show slight worsening in kidney function, BNP mildly improved at 1200, sodium concerning at 127, shows trend down in the past several days.  Chest x-ray with bilateral pleural effusions.  I am concerned that since patient is not taking her Lasix, she has continued CHF causing concerning hyponatremia.  Discussed with attending, Dr. Donnald Garre evaluated the pt. Discussed with cardiology, who states that since patient's pain is at baseline and she is no longer having shortness of breath, from a cardiology point of view, she is safe for outpatient follow-up.  Will consult with hospitalist.  Discussed with hospitalist, who agrees to admission for hyponatremia.  Patient admitted to [facility] service.   Final Clinical Impressions(s) / ED Diagnoses   Final diagnoses:  Hyponatremia    ED Discharge Orders    None       Alveria Apley, PA-C 11/20/17 0038    Arby Barrette, MD 11/29/17 1444

## 2017-11-20 ENCOUNTER — Encounter: Payer: Self-pay | Admitting: Cardiovascular Disease

## 2017-11-20 ENCOUNTER — Other Ambulatory Visit: Payer: Self-pay

## 2017-11-20 ENCOUNTER — Observation Stay (HOSPITAL_BASED_OUTPATIENT_CLINIC_OR_DEPARTMENT_OTHER): Payer: Medicaid Other

## 2017-11-20 DIAGNOSIS — I509 Heart failure, unspecified: Secondary | ICD-10-CM | POA: Diagnosis not present

## 2017-11-20 DIAGNOSIS — R079 Chest pain, unspecified: Secondary | ICD-10-CM | POA: Diagnosis not present

## 2017-11-20 DIAGNOSIS — N179 Acute kidney failure, unspecified: Secondary | ICD-10-CM | POA: Diagnosis not present

## 2017-11-20 DIAGNOSIS — I5043 Acute on chronic combined systolic (congestive) and diastolic (congestive) heart failure: Secondary | ICD-10-CM | POA: Diagnosis not present

## 2017-11-20 DIAGNOSIS — E871 Hypo-osmolality and hyponatremia: Secondary | ICD-10-CM | POA: Diagnosis not present

## 2017-11-20 LAB — GLUCOSE, CAPILLARY
GLUCOSE-CAPILLARY: 96 mg/dL (ref 65–99)
Glucose-Capillary: 106 mg/dL — ABNORMAL HIGH (ref 65–99)
Glucose-Capillary: 110 mg/dL — ABNORMAL HIGH (ref 65–99)
Glucose-Capillary: 189 mg/dL — ABNORMAL HIGH (ref 65–99)

## 2017-11-20 LAB — TROPONIN I
TROPONIN I: 0.04 ng/mL — AB (ref <0.03)
Troponin I: 0.04 ng/mL (ref <0.03)
Troponin I: 0.04 ng/mL (ref <0.03)

## 2017-11-20 LAB — CREATININE, SERUM
Creatinine, Ser: 1.58 mg/dL — ABNORMAL HIGH (ref 0.44–1.00)
GFR calc non Af Amer: 30 mL/min — ABNORMAL LOW (ref >60)
GFR, EST AFRICAN AMERICAN: 35 mL/min — AB (ref >60)

## 2017-11-20 LAB — TSH: TSH: 2.617 u[IU]/mL (ref 0.350–4.500)

## 2017-11-20 LAB — CBC
HEMATOCRIT: 26 % — AB (ref 36.0–46.0)
HEMATOCRIT: 28.4 % — AB (ref 36.0–46.0)
HEMOGLOBIN: 8.4 g/dL — AB (ref 12.0–15.0)
HEMOGLOBIN: 9.3 g/dL — AB (ref 12.0–15.0)
MCH: 26.1 pg (ref 26.0–34.0)
MCH: 26.3 pg (ref 26.0–34.0)
MCHC: 32.7 g/dL (ref 30.0–36.0)
MCHC: 32.3 g/dL (ref 30.0–36.0)
MCV: 81.3 fL (ref 78.0–100.0)
MCV: 79.6 fL (ref 78.0–100.0)
PLATELETS: 349 10*3/uL (ref 150–400)
Platelets: 317 10*3/uL (ref 150–400)
RBC: 3.2 MIL/uL — ABNORMAL LOW (ref 3.87–5.11)
RBC: 3.57 MIL/uL — AB (ref 3.87–5.11)
RDW: 13.3 % (ref 11.5–15.5)
RDW: 13.6 % (ref 11.5–15.5)
WBC: 6.5 10*3/uL (ref 4.0–10.5)
WBC: 6.3 10*3/uL (ref 4.0–10.5)

## 2017-11-20 LAB — ECHOCARDIOGRAM COMPLETE: Weight: 1735.46 oz

## 2017-11-20 LAB — BASIC METABOLIC PANEL
ANION GAP: 11 (ref 5–15)
BUN: 26 mg/dL — ABNORMAL HIGH (ref 6–20)
CHLORIDE: 94 mmol/L — AB (ref 101–111)
CO2: 21 mmol/L — ABNORMAL LOW (ref 22–32)
Calcium: 8.5 mg/dL — ABNORMAL LOW (ref 8.9–10.3)
Creatinine, Ser: 1.54 mg/dL — ABNORMAL HIGH (ref 0.44–1.00)
GFR calc Af Amer: 36 mL/min — ABNORMAL LOW (ref >60)
GFR calc non Af Amer: 31 mL/min — ABNORMAL LOW (ref >60)
GLUCOSE: 120 mg/dL — AB (ref 65–99)
POTASSIUM: 4 mmol/L (ref 3.5–5.1)
Sodium: 126 mmol/L — ABNORMAL LOW (ref 135–145)

## 2017-11-20 LAB — OSMOLALITY: OSMOLALITY: 280 mosm/kg (ref 275–295)

## 2017-11-20 MED ORDER — FUROSEMIDE 40 MG PO TABS
40.0000 mg | ORAL_TABLET | Freq: Two times a day (BID) | ORAL | Status: DC
  Administered 2017-11-21 – 2017-11-22 (×3): 40 mg via ORAL
  Filled 2017-11-20 (×4): qty 1

## 2017-11-20 MED ORDER — BENZONATATE 100 MG PO CAPS
100.0000 mg | ORAL_CAPSULE | Freq: Three times a day (TID) | ORAL | Status: DC | PRN
  Administered 2017-11-20 (×2): 100 mg via ORAL
  Filled 2017-11-20 (×2): qty 1

## 2017-11-20 MED ORDER — FUROSEMIDE 40 MG PO TABS: 40.0000 mg | ORAL_TABLET | Freq: Every day | ORAL | Status: DC

## 2017-11-20 MED ORDER — GUAIFENESIN 100 MG/5ML PO SOLN
5.0000 mL | ORAL | Status: DC | PRN
  Administered 2017-11-20 – 2017-11-21 (×4): 100 mg via ORAL
  Filled 2017-11-20 (×4): qty 5

## 2017-11-20 NOTE — Progress Notes (Signed)
PROGRESS NOTE    Sheila White  ZOX:[id number] DOB: Apr 04, 1941 DOA: 11/19/2017 PCP: Julieanne Manson, MD   Brief Narrative:  Sheila White is Sheila White 77 y.o. female with Sheila White known history of combined diastolic and systolic congestive heart failure, ischemic cardiomyopathy, CAD status post STEMI, post infarction pericarditis, DM 2, CK D presents to the emergency department for evaluation of chest pain.  Patient was in [place] usual state of health until April 2019 when she states sustained an MI. Since then she has had intermittent tightness in her central chest associated with dyspnea, nausea and worsening over the past few days.   Assessment & Plan:   Active Problems:   Chest pain, rule out acute myocardial infarction  This is Sheila White 77 y.o. female with Sheila White history of combined diastolic and systolic congestive heart failure, ischemic cardiomyopathy, CAD status post STEMI, post infarction pericarditis, DM 2, CKD now being admitted with:  # Chest pain, rule out ACS - Admit to inpatient with telemetry monitoring. - Trend troponins (flat), check TSH - A1c 6.6 in April, LDL 90 and HDL 40 at that time as well - continue ASA and brilinta - continue lipitor  - continue coreg - imdur - cards c/s, appreciate recs - repeat echo with EF 30-35%, grade 1 diastolic dysfunction (stable EF and WMA from previous study)  #. Hyponatremia - she appears euvolemic.  - urine sodium (with lasix, unlikely to be helpful - will also check FeUrea), urine osm, serum osm - decrease lasix to daily - follow sodium and exam  - Fluid restriction  #. H/o Diabetes - SSI, achs  - Heart healthy, carb controlled diet - hold glimepiride, linagliptin  #. HFrEF: EF 30-35%, grade 1 diastolic dysfunction.   - Continue Coreg, Lasix, Imdur  #. History of GERD - Continue Protonix  #. History of CKD - creatinine appears close to baseline - Monitor BMP  DVT prophylaxis: heparin Code Status: full code Family  Communication: son at bedside Disposition Plan: pending   Consultants:   cardiology  Procedures:  Echo Study Conclusions  - Left ventricle: The cavity size was normal. Wall thickness was   increased in Sheila White pattern of mild LVH. Systolic function was   moderately to severely reduced. The estimated ejection fraction   was in the range of 30% to 35%. Mid to distal anterior, apical   and inferoapical severe hypokinesis, suggestive of LAD territory   infarct. Doppler parameters are consistent with abnormal left   ventricular relaxation (grade 1 diastolic dysfunction). The E/e&'   ratio is between 8-15, suggesting indeterminate LV filling   pressure. - Aortic valve: Sclerosis without stenosis. There was trivial   regurgitation. - Mitral valve: Mildly thickened leaflets . There was trivial   regurgitation. - Left atrium: The atrium was mildly dilated. - Inferior vena cava: The vessel was normal in size. The   respirophasic diameter changes were in the normal range (>= 50%),   consistent with normal central venous pressure.  Impressions:  - Compared to Sheila White prior study in 09/2017, the LVEF and WMA&'s are   unchanged. LV filling pressure has improved.  Antimicrobials:  Anti-infectives (From admission, onward)   None     Subjective: No CP at the moment. Feels back to baseline. Son assisted with interpreting.  Attempted phone interpreter, but pt spoke softly so unable to hear.   Interviewed again with unit clerk who speaks spanish.  CP started yesterday, she thought she was having heart attack again.  She's currently CP free.   Objective: Vitals:   11/19/17 2100 11/19/17 2153 11/20/17 0049 11/20/17 0633  BP: (!) 120/53 126/67 138/70 120/73  Pulse: 82 87 91 89  Resp: (!) Temp:  (!) 97.5 F (36.4 C) 97.7 F (36.5 C) 98.4 F (36.9 C)  TempSrc:  Oral Oral Oral  SpO2: 98% 98% 100% 97%  Weight:  49.9 kg (109 lb 14.4 oz)  49.2 kg (108 lb 7.5 oz)    Intake/Output  Summary (Last 24 hours) at 11/20/2017 1032 Last data filed at 11/20/2017 1027 Gross per 24 hour  Intake 240 ml  Output -  Net 240 ml   Filed Weights   11/19/17 2153 11/20/17 0633  Weight: 49.9 kg (109 lb 14.4 oz) 49.2 kg (108 lb 7.5 oz)    Examination:  General exam: Appears calm and comfortable, pleasant elderly woman Respiratory system: Clear to auscultation. Respiratory effort normal. Cardiovascular system: S1 & S2 heard, RRR. No JVD, murmurs, rubs, gallops or clicks. No pedal edema. Gastrointestinal system: Abdomen is nondistended, soft and nontender. No organomegaly or masses felt. Normal bowel sounds heard. Central nervous system: Alert and oriented. No focal neurological deficits. Extremities: Symmetric 5 x 5 power. Skin: No rashes, lesions or ulcers Psychiatry: Judgement and insight appear normal. Mood & affect appropriate.     Data Reviewed: I have personally reviewed following labs and imaging studies  CBC: Recent Labs  Lab 11/17/17 1715 11/19/17 1458 11/20/17 0002  WBC 6.6 6.6 6.3  NEUTROABS 4.2  --   --   HGB 9.2* 8.8* 9.3*  HCT 28.2* 27.1* 28.4*  MCV 81 80.4 79.6  PLT 366 321 349   Basic Metabolic Panel: Recent Labs  Lab 11/17/17 1715 11/19/17 1458 11/20/17 0002  NA 129* 127*  --   K 5.4* 4.4  --   CL 95* 94*  --   CO2 19* 25  --   GLUCOSE 158* 110*  --   BUN 27 27*  --   CREATININE 1.36* 1.43* 1.58*  CALCIUM 9.1 8.6*  --    GFR: Estimated Creatinine Clearance: 19.2 mL/min (Sheila White) (by C-G formula based on SCr of 1.58 mg/dL (H)). Liver Function Tests: Recent Labs  Lab 11/17/17 1715 11/19/17 1523  AST 15 19  ALT 12 13*  ALKPHOS 72 59  BILITOT 0.3 0.4  PROT 7.0 6.4*  ALBUMIN 4.0 3.0*   Recent Labs  Lab 11/19/17 1521  LIPASE 49   No results for input(s): AMMONIA in the last 168 hours. Coagulation Profile: No results for input(s): INR, PROTIME in the last 168 hours. Cardiac Enzymes: Recent Labs  Lab 11/20/17 0002 11/20/17 0455    TROPONINI 0.04* 0.04*   BNP (last 3 results) No results for input(s): PROBNP in the last 8760 hours. HbA1C: No results for input(s): HGBA1C in the last 72 hours. CBG: Recent Labs  Lab 11/19/17 2324 11/20/17 0739  GLUCAP 127* 106*   Lipid Profile: No results for input(s): CHOL, HDL, LDLCALC, TRIG, CHOLHDL, LDLDIRECT in the last 72 hours. Thyroid Function Tests: No results for input(s): TSH, T4TOTAL, FREET4, T3FREE, THYROIDAB in the last 72 hours. Anemia Panel: No results for input(s): VITAMINB12, FOLATE, FERRITIN, TIBC, IRON, RETICCTPCT in the last 72 hours. Sepsis Labs: No results for input(s): PROCALCITON, LATICACIDVEN in the last 168 hours.  No results found for this or any previous visit (from the past 240 hour(s)).       Radiology Studies: Dg Chest 2 View  Result Date: 11/19/2017  CLINICAL DATA:  Abdominal pain and nausea. EXAM: CHEST - 2 VIEW COMPARISON:  10/31/2017 FINDINGS: Mildly enlarged cardiac silhouette. Calcific atherosclerotic disease and tortuosity of the aorta. Mild interstitial pulmonary edema. Bilateral small pleural effusions. Osseous structures are without acute abnormality. Soft tissues are grossly normal. IMPRESSION: Interstitial pulmonary edema with bilateral small pleural effusions. Mild enlargement of the cardiac silhouette. Calcific atherosclerotic disease of the aorta. Electronically Signed   By: Sheila White M.D.   On: 11/19/2017 15:55        Scheduled Meds: . aspirin  81 mg Oral Daily  . atorvastatin  80 mg Oral Daily  . carvedilol  4.6875 mg Oral BID WC  . dicyclomine  10 mg Oral TID AC  . furosemide  40 mg Oral BID  . heparin  5,000 Units Subcutaneous Q8H  . insulin aspart  0-5 Units Subcutaneous QHS  . insulin aspart  0-9 Units Subcutaneous TID WC  . isosorbide mononitrate  15 mg Oral Daily  . ondansetron (ZOFRAN) IV  4 mg Intravenous Once  . pantoprazole  40 mg Oral BID  . sucralfate  1 g Oral TID WC & HS  . ticagrelor  90 mg  Oral BID   Continuous Infusions:   LOS: 0 days    Time spent: over 30 min    Lacretia Nicks, MD [facility] Pager [phone]  If 7PM-7AM, please contact night-coverage [email or web address] Password [facility] 11/20/2017, 10:32 AM

## 2017-11-20 NOTE — Progress Notes (Signed)
Received patient in a bed with family members, Assessment done, skin intact, she denied pain at the time, SOB patient and family educated about safety plan, medication, admission process. We continue to monitor.

## 2017-11-20 NOTE — Progress Notes (Signed)
  Echocardiogram 2D Echocardiogram has been performed.  Delcie Roch 11/20/2017, 10:49 AM

## 2017-11-20 NOTE — Progress Notes (Signed)
Progress Note  Patient Name: Sheila White Date of Encounter: 11/20/2017  Primary Cardiologist: Nicki Guadalajara, MD   Subjective   C/o pleuritic chest pain. Denies fever or chills. History provided by spanish speaking unit clerk.   Inpatient Medications    Scheduled Meds: . aspirin  81 mg Oral Daily  . atorvastatin  80 mg Oral Daily  . carvedilol  4.6875 mg Oral BID WC  . dicyclomine  10 mg Oral TID AC  . furosemide  40 mg Oral BID  . heparin  5,000 Units Subcutaneous Q8H  . insulin aspart  0-5 Units Subcutaneous QHS  . insulin aspart  0-9 Units Subcutaneous TID WC  . isosorbide mononitrate  15 mg Oral Daily  . ondansetron (ZOFRAN) IV  4 mg Intravenous Once  . pantoprazole  40 mg Oral BID  . sucralfate  1 g Oral TID WC & HS  . ticagrelor  90 mg Oral BID   Continuous Infusions:  PRN Meds: ALPRAZolam, gi cocktail, guaiFENesin, loratadine, morphine injection, nitroGLYCERIN, ondansetron (ZOFRAN) IV   Vital Signs    Vitals:   11/19/17 2100 11/19/17 2153 11/20/17 0049 11/20/17 0633  BP: (!) 120/53 126/67 138/70 120/73  Pulse: 82 87 91 89  Resp: (!) Temp:  (!) 97.5 F (36.4 C) 97.7 F (36.5 C) 98.4 F (36.9 C)  TempSrc:  Oral Oral Oral  SpO2: 98% 98% 100% 97%  Weight:  109 lb 14.4 oz (49.9 kg)  108 lb 7.5 oz (49.2 kg)    Intake/Output Summary (Last 24 hours) at 11/20/2017 1105 Last data filed at 11/20/2017 1027 Gross per 24 hour  Intake 240 ml  Output -  Net 240 ml   Filed Weights   11/19/17 2153 11/20/17 0633  Weight: 109 lb 14.4 oz (49.9 kg) 108 lb 7.5 oz (49.2 kg)    Telemetry    nsr - Personally Reviewed  ECG    NSR with old inferior MI  - Personally Reviewed  Physical Exam   GEN: chronically ill appreaing 77 yo woman, no acute distress.   Neck: 6 cm JVD Cardiac: RRR, no murmurs, rubs, or gallops.  Respiratory: Clear to auscultation bilaterally. GI: Soft, nontender, non-distended  MS: No edema; No deformity. Neuro:   Nonfocal  Psych: Normal affect   Labs    Chemistry Recent Labs  Lab 11/17/17 1715 11/19/17 1458 11/19/17 1523 11/20/17 0002  NA 129* 127*  --   --   K 5.4* 4.4  --   --   CL 95* 94*  --   --   CO2 19* 25  --   --   GLUCOSE 158* 110*  --   --   BUN 27 27*  --   --   CREATININE 1.36* 1.43*  --  1.58*  CALCIUM 9.1 8.6*  --   --   PROT 7.0  --  6.4*  --   ALBUMIN 4.0  --  3.0*  --   AST 15  --  19  --   ALT 12  --  13*  --   ALKPHOS 72  --  59  --   BILITOT 0.3  --  0.4  --   GFRNONAA 37* 34*  --  30*  GFRAA 43* 39*  --  35*  ANIONGAP  --  8  --   --      Hematology Recent Labs  Lab 11/19/17 1458 11/20/17 0002 11/20/17 1040  WBC 6.6  6.3 6.5  RBC 3.37* 3.57* 3.20*  HGB 8.8* 9.3* 8.4*  HCT 27.1* 28.4* 26.0*  MCV 80.4 79.6 81.3  MCH 26.1 26.1 26.3  MCHC 32.5 32.7 32.3  RDW 13.4 13.3 13.6  PLT 321 349 317    Cardiac Enzymes Recent Labs  Lab 11/20/17 0002 11/20/17 0455  TROPONINI 0.04* 0.04*    Recent Labs  Lab 11/19/17 1509 11/19/17 1819  TROPIPOC 0.00 0.03     BNP Recent Labs  Lab 11/19/17 1458  BNP 1,223.8*     DDimer No results for input(s): DDIMER in the last 168 hours.   Radiology    Dg Chest 2 View  Result Date: 11/19/2017 CLINICAL DATA:  Abdominal pain and nausea. EXAM: CHEST - 2 VIEW COMPARISON:  10/31/2017 FINDINGS: Mildly enlarged cardiac silhouette. Calcific atherosclerotic disease and tortuosity of the aorta. Mild interstitial pulmonary edema. Bilateral small pleural effusions. Osseous structures are without acute abnormality. Soft tissues are grossly normal. IMPRESSION: Interstitial pulmonary edema with bilateral small pleural effusions. Mild enlargement of the cardiac silhouette. Calcific atherosclerotic disease of the aorta. Electronically Signed   By: Ted Mcalpine M.D.   On: 11/19/2017 15:55    Cardiac Studies   None, echo pending  Patient Profile     77 y.o. female admitted with non-cardiac chest pain and  sob.  Assessment & Plan    1. Atypical chest pain - she has an echo pending. I do not appreciate a friction rub. Her symptoms do not appear to be due to angina. Could be pericarditis but ECG and exam do not suggest this diagnosis. Her enzymes are negative. 2. Sob - she does not appear to be volume overloaded on exam. BNP elevated but not as high prior. I am not sure she needs IV lasix.  3. Chronic stage 2 renal failure - we will follow.  For questions or updates, please contact [facility] Please consult [email or web address] for contact info under Cardiology/STEMI.      Signed, Lewayne Bunting, MD  11/20/2017, 11:05 AM  Patient ID: Darrick Huntsman, female   DOB: 05/03/1941, 77 y.o.   MRN: [id number]

## 2017-11-20 NOTE — Progress Notes (Signed)
Received call form lab with Troponin level of 0.04, MD notified, new order placed. We continue to monitor.

## 2017-11-21 ENCOUNTER — Inpatient Hospital Stay (HOSPITAL_COMMUNITY): Payer: Medicaid Other

## 2017-11-21 ENCOUNTER — Observation Stay (HOSPITAL_COMMUNITY): Payer: Medicaid Other

## 2017-11-21 DIAGNOSIS — N179 Acute kidney failure, unspecified: Secondary | ICD-10-CM | POA: Diagnosis present

## 2017-11-21 DIAGNOSIS — I25119 Atherosclerotic heart disease of native coronary artery with unspecified angina pectoris: Secondary | ICD-10-CM | POA: Diagnosis present

## 2017-11-21 DIAGNOSIS — R079 Chest pain, unspecified: Principal | ICD-10-CM

## 2017-11-21 DIAGNOSIS — N182 Chronic kidney disease, stage 2 (mild): Secondary | ICD-10-CM | POA: Diagnosis present

## 2017-11-21 DIAGNOSIS — I252 Old myocardial infarction: Secondary | ICD-10-CM | POA: Diagnosis not present

## 2017-11-21 DIAGNOSIS — Z9119 Patient's noncompliance with other medical treatment and regimen: Secondary | ICD-10-CM | POA: Diagnosis not present

## 2017-11-21 DIAGNOSIS — I255 Ischemic cardiomyopathy: Secondary | ICD-10-CM | POA: Diagnosis present

## 2017-11-21 DIAGNOSIS — Z7982 Long term (current) use of aspirin: Secondary | ICD-10-CM | POA: Diagnosis not present

## 2017-11-21 DIAGNOSIS — E871 Hypo-osmolality and hyponatremia: Secondary | ICD-10-CM | POA: Diagnosis present

## 2017-11-21 DIAGNOSIS — Z79899 Other long term (current) drug therapy: Secondary | ICD-10-CM | POA: Diagnosis not present

## 2017-11-21 DIAGNOSIS — E1122 Type 2 diabetes mellitus with diabetic chronic kidney disease: Secondary | ICD-10-CM | POA: Diagnosis present

## 2017-11-21 DIAGNOSIS — E1151 Type 2 diabetes mellitus with diabetic peripheral angiopathy without gangrene: Secondary | ICD-10-CM | POA: Diagnosis present

## 2017-11-21 DIAGNOSIS — I5043 Acute on chronic combined systolic (congestive) and diastolic (congestive) heart failure: Secondary | ICD-10-CM | POA: Diagnosis present

## 2017-11-21 DIAGNOSIS — Z7984 Long term (current) use of oral hypoglycemic drugs: Secondary | ICD-10-CM | POA: Diagnosis not present

## 2017-11-21 DIAGNOSIS — Z8673 Personal history of transient ischemic attack (TIA), and cerebral infarction without residual deficits: Secondary | ICD-10-CM | POA: Diagnosis not present

## 2017-11-21 DIAGNOSIS — H9190 Unspecified hearing loss, unspecified ear: Secondary | ICD-10-CM | POA: Diagnosis present

## 2017-11-21 DIAGNOSIS — K219 Gastro-esophageal reflux disease without esophagitis: Secondary | ICD-10-CM | POA: Diagnosis present

## 2017-11-21 LAB — BASIC METABOLIC PANEL
ANION GAP: 11 (ref 5–15)
BUN: 31 mg/dL — ABNORMAL HIGH (ref 6–20)
CHLORIDE: 94 mmol/L — AB (ref 101–111)
CO2: 24 mmol/L (ref 22–32)
Calcium: 8.9 mg/dL (ref 8.9–10.3)
Creatinine, Ser: 1.73 mg/dL — ABNORMAL HIGH (ref 0.44–1.00)
GFR calc non Af Amer: 27 mL/min — ABNORMAL LOW (ref >60)
GFR, EST AFRICAN AMERICAN: 31 mL/min — AB (ref >60)
GLUCOSE: 109 mg/dL — AB (ref 65–99)
Potassium: 4 mmol/L (ref 3.5–5.1)
Sodium: 129 mmol/L — ABNORMAL LOW (ref 135–145)

## 2017-11-21 LAB — CBC
HEMATOCRIT: 27.5 % — AB (ref 36.0–46.0)
HEMOGLOBIN: 9.1 g/dL — AB (ref 12.0–15.0)
MCH: 26.3 pg (ref 26.0–34.0)
MCHC: 33.1 g/dL (ref 30.0–36.0)
MCV: 79.5 fL (ref 78.0–100.0)
Platelets: 330 10*3/uL (ref 150–400)
RBC: 3.46 MIL/uL — ABNORMAL LOW (ref 3.87–5.11)
RDW: 13.3 % (ref 11.5–15.5)
WBC: 6.3 10*3/uL (ref 4.0–10.5)

## 2017-11-21 LAB — GLUCOSE, CAPILLARY
GLUCOSE-CAPILLARY: 166 mg/dL — AB (ref 65–99)
GLUCOSE-CAPILLARY: 138 mg/dL — AB (ref 65–99)
GLUCOSE-CAPILLARY: 127 mg/dL — AB (ref 65–99)
Glucose-Capillary: 100 mg/dL — ABNORMAL HIGH (ref 65–99)

## 2017-11-21 LAB — MAGNESIUM: Magnesium: 1.6 mg/dL — ABNORMAL LOW (ref 1.7–2.4)

## 2017-11-21 MED ORDER — LORAZEPAM 2 MG/ML IJ SOLN
0.2500 mg | Freq: Once | INTRAMUSCULAR | Status: AC | PRN
  Administered 2017-11-21: 0.25 mg via INTRAVENOUS
  Filled 2017-11-21 (×2): qty 1

## 2017-11-21 MED ORDER — MAGNESIUM SULFATE 2 GM/50ML IV SOLN
2.0000 g | Freq: Once | INTRAVENOUS | Status: AC
  Administered 2017-11-21: 2 g via INTRAVENOUS
  Filled 2017-11-21: qty 50

## 2017-11-21 NOTE — Progress Notes (Signed)
Educated pt and pt family on PRN anxiety medication, ativan, to complete MRI  Pt and pt family understanding

## 2017-11-21 NOTE — Progress Notes (Addendum)
PROGRESS NOTE    Sheila White  WUJ:[id number] DOB: Jan 21, 1941 DOA: 11/19/2017 PCP: Julieanne Manson, MD   Brief Narrative:  Sheila White is Sheila White 78 y.o. female with Sheila White known history of combined diastolic and systolic congestive heart failure, ischemic cardiomyopathy, CAD status post STEMI, post infarction pericarditis, DM 2, CK D presents to the emergency department for evaluation of chest pain.  Patient was in Sheila White usual state of health until April 2019 when she states sustained an MI. Since then she has had intermittent tightness in her central chest associated with dyspnea, nausea and worsening over the past few days.   Assessment & Plan:   Active Problems:   Chest pain, rule out acute myocardial infarction  This is Sheila White 77 y.o. female with Sheila White history of combined diastolic and systolic congestive heart failure, ischemic cardiomyopathy, CAD status post STEMI, post infarction pericarditis, DM 2, CKD now being admitted with:  # Chest pain, rule out ACS - Admit to inpatient with telemetry monitoring. - Trend troponins (flat), check TSH (wnl) - A1c 6.6 in April, LDL 90 and HDL 40 at that time as well - continue ASA and brilinta - continue lipitor  - continue coreg - imdur - cards c/s, appreciate recs - repeat echo with EF 30-35%, grade 1 diastolic dysfunction (stable EF and WMA from previous study)  # Blurry vision  Gait Abnormality:  Sound like both of these are relatively new and started around the same time as her presenting symptoms (her daughter and son confirm this).  On walking with PT she was noted to lean to the right and she has also described blurry vision.  - follow up MRI brain  #. Hyponatremia - she appears euvolemic.  Improved today, continue to monitor.   - urine sodium (with lasix, unlikely to be helpful - will also check FeUrea), urine osm, serum osm (pending collection) - continue BID lasix - follow sodium and exam  - Fluid restriction  #. H/o  Diabetes - SSI, achs  - Heart healthy, carb controlled diet - hold glimepiride, linagliptin  #. HFrEF: EF 30-35%, grade 1 diastolic dysfunction.   - Continue Coreg, Lasix, Imdur  #. History of GERD - Continue Protonix  #. History of CKD - creatinine up today from yesterday, but seems to be around baseline still  - Monitor BMP  # Hypomag: replete  # Cough: cxr with some interstitial edema and small bilateral effusions.  Continue sx treatment and follow outpatient.   DVT prophylaxis: heparin Code Status: full code Family Communication: son at bedside Disposition Plan: once MRI complete and cleared by cards   Consultants:   cardiology  Procedures:  Echo Study Conclusions  - Left ventricle: The cavity size was normal. Wall thickness was   increased in Sheila White pattern of mild LVH. Systolic function was   moderately to severely reduced. The estimated ejection fraction   was in the range of 30% to 35%. Mid to distal anterior, apical   and inferoapical severe hypokinesis, suggestive of LAD territory   infarct. Doppler parameters are consistent with abnormal left   ventricular relaxation (grade 1 diastolic dysfunction). The E/e&'   ratio is between 8-15, suggesting indeterminate LV filling   pressure. - Aortic valve: Sclerosis without stenosis. There was trivial   regurgitation. - Mitral valve: Mildly thickened leaflets . There was trivial   regurgitation. - Left atrium: The atrium was mildly dilated. - Inferior vena cava: The vessel was normal in size. The  respirophasic diameter changes were in the normal range (>= 50%),   consistent with normal central venous pressure.  Impressions:  - Compared to Markia Kyer prior study in 09/2017, the LVEF and WMA&'s are   unchanged. LV filling pressure has improved.  Antimicrobials:  Anti-infectives (From admission, onward)   None     Subjective: CP resolved. She's feeling close to baseline. On walking with PT she was leaning to  right.  Also noted blurry vision, which seems different. Discussed with son/daughter who confirm these sx probably new and started around her initial presentation. Cough still bothering her. Discussed with son/daughter present and video interpreter.   Objective: Vitals:   11/21/17 0424 11/21/17 0700 11/21/17 0933 11/21/17 1230  BP:   97/63 (!) 91/51  Pulse:   83 75  Resp:    20  Temp:    98.3 F (36.8 C)  TempSrc:    Oral  SpO2:    97%  Weight: 49.2 kg (108 lb 6.4 oz)     Height:   (1.499 m)      Intake/Output Summary (Last 24 hours) at 11/21/2017 1711 Last data filed at 11/21/2017 1450 Gross per 24 hour  Intake 540 ml  Output 700 ml  Net -160 ml   Filed Weights   11/19/17 2153 11/20/17 0633 11/21/17 0424  Weight: 49.9 kg (109 lb 14.4 oz) 49.2 kg (108 lb 7.5 oz) 49.2 kg (108 lb 6.4 oz)    Examination:  General: No acute distress. Cardiovascular: Heart sounds show Azim Gillingham regular rate, and rhythm. No gallops or rubs. No murmurs. No JVD. Lungs: Clear to auscultation bilaterally with good air movement. No rales, rhonchi or wheezes. Abdomen: Soft, nontender, nondistended with normal active bowel sounds. No masses. No hepatosplenomegaly. Neurological: Alert and oriented 3. Moves all extremities 4 with equal strength. Cranial nerves II through XII intact. Skin: Warm and dry. No rashes or lesions. Extremities: No clubbing or cyanosis. No edema.  Psychiatric: Mood and affect are normal. Insight and judgment are appropriate.   Data Reviewed: I have personally reviewed following labs and imaging studies  CBC: Recent Labs  Lab 11/17/17 1715 11/19/17 1458 11/20/17 0002 11/20/17 1040 11/21/17 0358  WBC 6.6 6.6 6.3 6.5 6.3  NEUTROABS 4.2  --   --   --   --   HGB 9.2* 8.8* 9.3* 8.4* 9.1*  HCT 28.2* 27.1* 28.4* 26.0* 27.5*  MCV 81 80.4 79.6 81.3 79.5  PLT 366 321 349 317 330   Basic Metabolic Panel: Recent Labs  Lab 11/17/17 1715 11/19/17 1458 11/20/17 0002  11/20/17 1040 11/21/17 0358  NA 129* 127*  --  126* 129*  K 5.4* 4.4  --  4.0 4.0  CL 95* 94*  --  94* 94*  CO2 19* 25  --  21* 24  GLUCOSE 158* 110*  --  120* 109*  BUN 27 27*  --  26* 31*  CREATININE 1.36* 1.43* 1.58* 1.54* 1.73*  CALCIUM 9.1 8.6*  --  8.5* 8.9  MG  --   --   --   --  1.6*   GFR: Estimated Creatinine Clearance: 18 mL/min (Kailana Benninger) (by C-G formula based on SCr of 1.73 mg/dL (H)). Liver Function Tests: Recent Labs  Lab 11/17/17 1715 11/19/17 1523  AST 15 19  ALT 12 13*  ALKPHOS 72 59  BILITOT 0.3 0.4  PROT 7.0 6.4*  ALBUMIN 4.0 3.0*   Recent Labs  Lab 11/19/17 1521  LIPASE 49   No results for input(s):  AMMONIA in the last 168 hours. Coagulation Profile: No results for input(s): INR, PROTIME in the last 168 hours. Cardiac Enzymes: Recent Labs  Lab 11/20/17 0002 11/20/17 0455 11/20/17 1040  TROPONINI 0.04* 0.04* 0.04*   BNP (last 3 results) No results for input(s): PROBNP in the last 8760 hours. HbA1C: No results for input(s): HGBA1C in the last 72 hours. CBG: Recent Labs  Lab 11/20/17 1645 11/20/17 2137 11/21/17 0746 11/21/17 1134 11/21/17 1626  GLUCAP 189* 96 100* 166* 138*   Lipid Profile: No results for input(s): CHOL, HDL, LDLCALC, TRIG, CHOLHDL, LDLDIRECT in the last 72 hours. Thyroid Function Tests: Recent Labs    11/20/17 1843  TSH 2.617   Anemia Panel: No results for input(s): VITAMINB12, FOLATE, FERRITIN, TIBC, IRON, RETICCTPCT in the last 72 hours. Sepsis Labs: No results for input(s): PROCALCITON, LATICACIDVEN in the last 168 hours.  No results found for this or any previous visit (from the past 240 hour(s)).       Radiology Studies: No results found.      Scheduled Meds: . aspirin  81 mg Oral Daily  . atorvastatin  80 mg Oral Daily  . carvedilol  4.6875 mg Oral BID WC  . dicyclomine  10 mg Oral TID AC  . furosemide  40 mg Oral BID  . heparin  5,000 Units Subcutaneous Q8H  . insulin aspart  0-5 Units  Subcutaneous QHS  . insulin aspart  0-9 Units Subcutaneous TID WC  . isosorbide mononitrate  15 mg Oral Daily  . ondansetron (ZOFRAN) IV  4 mg Intravenous Once  . pantoprazole  40 mg Oral BID  . sucralfate  1 g Oral TID WC & HS  . ticagrelor  90 mg Oral BID   Continuous Infusions:   LOS: 0 days    Time spent: over 30 min    Lacretia Nicks, MD [facility] Pager [phone]  If 7PM-7AM, please contact night-coverage [email or web address] Password [email or web address] 11/21/2017, 5:11 PM

## 2017-11-21 NOTE — Evaluation (Signed)
Physical Therapy Evaluation Patient Details Name: Sheila White MRN: [id number] DOB: 10-15-1940 Today's Date: 11/21/2017   History of Present Illness  Patient is a 77 y.o. admitted with atypical CP. PMHx: DM, CAD, STEMI with post infarction pericarditis, CHF, ICM, CKD  Clinical Impression  Pt pleasant and willing to participate with PT. Son present throughout session. Pt and son speak limited english and Ipad interpreter used [employer] [phone]). Pt has 24 hour supervision and RW but does not use consistently due to family worry that pt will bump into things due to living in a small house. Pt displays significantly decreased stability when ambulating with HHA compared to RW. RW use recommended for mobilization and pt/family educated regarding importance of clearing space for RW use in home. Pt c/o blurry vision when ambulating in hall but cause is unclear. Pt states that she has never worn glasses. Pt would benefit from continued acute PT to address impairments of instability to increase level of functional mobility and level of independence.       Follow Up Recommendations Home health PT;Supervision/Assistance - 24 hour    Equipment Recommendations  None recommended by PT    Recommendations for Other Services       Precautions / Restrictions Precautions Precautions: Fall Precaution Comments: recent fall last week with bruise to face Restrictions Weight Bearing Restrictions: No      Mobility  Bed Mobility Overal bed mobility: Modified Independent             General bed mobility comments: Pt performed supine>sit EOB mod I with increased time required and HOB slightly elevated  Transfers Overall transfer level: Needs assistance Equipment used: None Transfers: Sit to/from Stand Sit to Stand: Min guard         General transfer comment: Pt performed sit>stand from EOB without AD requiring min guard due to instability to R upon  standing.  Ambulation/Gait Ambulation/Gait assistance: Min assist Ambulation Distance (Feet): 175 Feet Assistive device: Rolling walker (2 wheeled) Gait Pattern/deviations: Step-through pattern;Decreased stride length;Trunk flexed   Gait velocity interpretation: <1.31 ft/sec, indicative of household ambulator General Gait Details: Pt ambulated 175 ft slowly with RW and requires frequent cueing to stand upright, look up, and step into RW. Min A required for assistance with steering RW. Pt states that vision was blurry during ambulation but no LOB noted when pt used RW. Pt attempted ambulation with R HHA with noted instability and pt immediately reaching for objects around room. Pt experienced 1 LOB requiring Min A for balance correction. After ambulation, pt reports fatigue  Stairs            Wheelchair Mobility    Modified Rankin (Stroke Patients Only)       Balance Overall balance assessment: Needs assistance   Sitting balance-Leahy Scale: Fair Sitting balance - Comments: Pt able to sit EOB without UE support   Standing balance support: No upper extremity supported;During functional activity Standing balance-Leahy Scale: Fair Standing balance comment: Pt required UE support when attempting to stand without support                             Pertinent Vitals/Pain Pain Assessment: No/denies pain    Home Living Family/patient expects to be discharged to:: Private residence Living Arrangements: Children Available Help at Discharge: Family;Available 24 hours/day Type of Home: House Home Access: Level entry     Home Layout: One level Home Equipment: Walker - 2 wheels;Bedside commode  Additional Comments: lives with daughter    Prior Function Level of Independence: Independent         Comments: Patient independent with ADL's and ambulation but reportedly looks for things to grab ahold of. Does not drive. Enjoys going to church.      Hand Dominance         Extremity/Trunk Assessment   Upper Extremity Assessment Upper Extremity Assessment: Generalized weakness    Lower Extremity Assessment Lower Extremity Assessment: Generalized weakness       Communication   Communication: Prefers language other than English;Interpreter utilized(Eddie [id number] with str[employer] video interpreters utilized throughout)  Cognition Arousal/Alertness: Awake/alert Behavior During Therapy: WFL for tasks assessed/performed Overall Cognitive Status: Within Functional Limits for tasks assessed                                 General Comments: Some delay in answering questions related to name and DOB; Delay possibly due to interpreter difficulties vs cognitive impairment      General Comments      Exercises     Assessment/Plan    PT Assessment Patient needs continued PT services  PT Problem List Decreased strength;Decreased activity tolerance;Decreased balance;Decreased mobility;Decreased knowledge of use of DME;Decreased safety awareness       PT Treatment Interventions DME instruction;Gait training;Functional mobility training;Therapeutic activities;Therapeutic exercise;Balance training;Neuromuscular re-education;Patient/family education    PT Goals (Current goals can be found in the Care Plan section)  Acute Rehab PT Goals Patient Stated Goal: To return home PT Goal Formulation: With patient/family Time For Goal Achievement: 12/05/17 Potential to Achieve Goals: Good    Frequency Min 3X/week   Barriers to discharge Other (comment) Pt only safe to ambulate with AD. Prior to arrival, pt not using RW consistently    Co-evaluation               AM-PAC PT "6 Clicks" Daily Activity  Outcome Measure Difficulty turning over in bed (including adjusting bedclothes, sheets and blankets)?: None Difficulty moving from lying on back to sitting on the side of the bed? : None Difficulty sitting down on and standing up from a chair  with arms (e.g., wheelchair, bedside commode, etc,.)?: A Little Help needed moving to and from a bed to chair (including a wheelchair)?: A Little Help needed walking in hospital room?: A Little Help needed climbing 3-5 steps with a railing? : A Lot 6 Click Score: 19    End of Session Equipment Utilized During Treatment: Gait belt Activity Tolerance: Patient tolerated treatment well Patient left: in chair;with chair alarm set;with family/visitor present;with call bell/phone within reach;Other (comment)(MD present in room) Nurse Communication: Mobility status PT Visit Diagnosis: Unsteadiness on feet (R26.81);Other abnormalities of gait and mobility (R26.89);Muscle weakness (generalized) (M62.81);History of falling (Z91.81)    Time: 8119-1478 PT Time Calculation (min) (ACUTE ONLY): 33 min   Charges:   PT Evaluation $PT Eval Moderate Complexity: 1 Mod PT Treatments $Gait Training: 8-22 mins   PT G Codes:        Gabe Karington Zarazua, SPT  [employer] 11/21/2017, 2:23 PM

## 2017-11-21 NOTE — Progress Notes (Signed)
2  Progress Note  Patient Name: Sheila White Date of Encounter: 11/21/2017  Primary Cardiologist: Nicki Guadalajara, MD   Subjective   She denies any further chest pain per family who are at bedside translating  Inpatient Medications    Scheduled Meds: . aspirin  81 mg Oral Daily  . atorvastatin  80 mg Oral Daily  . carvedilol  4.6875 mg Oral BID WC  . dicyclomine  10 mg Oral TID AC  . furosemide  40 mg Oral BID  . heparin  5,000 Units Subcutaneous Q8H  . insulin aspart  0-5 Units Subcutaneous QHS  . insulin aspart  0-9 Units Subcutaneous TID WC  . isosorbide mononitrate  15 mg Oral Daily  . ondansetron (ZOFRAN) IV  4 mg Intravenous Once  . pantoprazole  40 mg Oral BID  . sucralfate  1 g Oral TID WC & HS  . ticagrelor  90 mg Oral BID   Continuous Infusions:  PRN Meds: ALPRAZolam, benzonatate, gi cocktail, guaiFENesin, loratadine, morphine injection, nitroGLYCERIN, ondansetron (ZOFRAN) IV   Vital Signs    Vitals:   11/20/17 2328 11/21/17 0415 11/21/17 0424 11/21/17 0700  BP: 127/66 126/76    Pulse: 84 86    Resp: 16 16    Temp: 98.2 F (36.8 C) 98.8 F (37.1 C)    TempSrc: Oral Oral    SpO2: 98% 100%    Weight:   108 lb 6.4 oz (49.2 kg)   Height:     (1.499 m)    Intake/Output Summary (Last 24 hours) at 11/21/2017 0838 Last data filed at 11/21/2017 0400 Gross per 24 hour  Intake 600 ml  Output 1200 ml  Net -600 ml   Filed Weights   11/19/17 2153 11/20/17 0633 11/21/17 0424  Weight: 109 lb 14.4 oz (49.9 kg) 108 lb 7.5 oz (49.2 kg) 108 lb 6.4 oz (49.2 kg)    Telemetry    NSR - Personally Reviewed  ECG    No new EKG to review - Personally Reviewed  Physical Exam   GEN: No acute distress.   Neck: No JVD Cardiac: RRR, no murmurs, rubs, or gallops.  Respiratory: Clear to auscultation bilaterally. GI: Soft, nontender, non-distended  MS: No edema; No deformity. Neuro:  Nonfocal  Psych: Normal affect   Labs    Chemistry Recent  Labs  Lab 11/17/17 1715 11/19/17 1458 11/19/17 1523 11/20/17 0002 11/20/17 1040 11/21/17 0358  NA 129* 127*  --   --  126* 129*  K 5.4* 4.4  --   --  4.0 4.0  CL 95* 94*  --   --  94* 94*  CO2 19* 25  --   --  21* 24  GLUCOSE 158* 110*  --   --  120* 109*  BUN 27 27*  --   --  26* 31*  CREATININE 1.36* 1.43*  --  1.58* 1.54* 1.73*  CALCIUM 9.1 8.6*  --   --  8.5* 8.9  PROT 7.0  --  6.4*  --   --   --   ALBUMIN 4.0  --  3.0*  --   --   --   AST 15  --  19  --   --   --   ALT 12  --  13*  --   --   --   ALKPHOS 72  --  59  --   --   --   BILITOT 0.3  --  0.4  --   --   --  GFRNONAA 37* 34*  --  30* 31* 27*  GFRAA 43* 39*  --  35* 36* 31*  ANIONGAP  --  8  --   --  11 11     Hematology Recent Labs  Lab 11/20/17 0002 11/20/17 1040 11/21/17 0358  WBC 6.3 6.5 6.3  RBC 3.57* 3.20* 3.46*  HGB 9.3* 8.4* 9.1*  HCT 28.4* 26.0* 27.5*  MCV 79.6 81.3 79.5  MCH 26.1 26.3 26.3  MCHC 32.7 32.3 33.1  RDW 13.3 13.6 13.3  PLT 349 317 330    Cardiac Enzymes Recent Labs  Lab 11/20/17 0002 11/20/17 0455 11/20/17 1040  TROPONINI 0.04* 0.04* 0.04*    Recent Labs  Lab 11/19/17 1509 11/19/17 1819  TROPIPOC 0.00 0.03     BNP Recent Labs  Lab 11/19/17 1458  BNP 1,223.8*     DDimer No results for input(s): DDIMER in the last 168 hours.   Radiology    Dg Chest 2 View  Result Date: 11/19/2017 CLINICAL DATA:  Abdominal pain and nausea. EXAM: CHEST - 2 VIEW COMPARISON:  10/31/2017 FINDINGS: Mildly enlarged cardiac silhouette. Calcific atherosclerotic disease and tortuosity of the aorta. Mild interstitial pulmonary edema. Bilateral small pleural effusions. Osseous structures are without acute abnormality. Soft tissues are grossly normal. IMPRESSION: Interstitial pulmonary edema with bilateral small pleural effusions. Mild enlargement of the cardiac silhouette. Calcific atherosclerotic disease of the aorta. Electronically Signed   By: Ted Mcalpine M.D.   On: 11/19/2017  15:55    Cardiac Studies   2D echo 10/2017 Study Conclusions  - Left ventricle: The cavity size was normal. Wall thickness was   increased in a pattern of mild LVH. Systolic function was   moderately to severely reduced. The estimated ejection fraction   was in the range of 30% to 35%. Mid to distal anterior, apical   and inferoapical severe hypokinesis, suggestive of LAD territory   infarct. Doppler parameters are consistent with abnormal left   ventricular relaxation (grade 1 diastolic dysfunction). The E/e&'   ratio is between 8-15, suggesting indeterminate LV filling   pressure. - Aortic valve: Sclerosis without stenosis. There was trivial   regurgitation. - Mitral valve: Mildly thickened leaflets . There was trivial   regurgitation. - Left atrium: The atrium was mildly dilated. - Inferior vena cava: The vessel was normal in size. The   respirophasic diameter changes were in the normal range (>= 50%),   consistent with normal central venous pressure.  Impressions:  - Compared to a prior study in 09/2017, the LVEF and WMA&'s are   unchanged. LV filling pressure has improved.  Patient Profile     77 y.o. female admitted with non-cardiac chest pain and sob.  Assessment & Plan    1.  Chest pain - atypical in nature.   -Troponin minimally elevated at 0.04 x 3 with flat trend.   -EKG and exam do not suggest pericarditis and no effusion on echo   2.  ASCAD - cath a month ago in the setting of acute late presentation anterior STEMI showed sub ototalled long prox LAD , 90% mid LAD and 99% apical stenosis, moderate dx of prox OM1 and 95% distal OM1, 30% PDA ad patent RCA.  S/P PCI of the LAD proximally, mid and idstally - continue ASA  daily, Brilinta  BID,  Imdur  daily, statin and BB.   3.  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 - continue carvedilol and  long acting nitrates.   4.  Acute on Chronic combined  systolic/diastolic CHF -BNP elevated this admit but improved from prior admission.  -she put out 1.2L yesterday and is net neg 180cc.   -cxray on 5/25 showed pleural effusions and interstitial edema -BP too soft to restart Hydralazine     For questions or updates, please contact [facility] Please consult [email or web address] for contact info under Cardiology/STEMI.      Signed, Armanda Magic, MD  11/21/2017, 8:38 AM

## 2017-11-22 ENCOUNTER — Telehealth: Payer: Self-pay | Admitting: Internal Medicine

## 2017-11-22 LAB — CBC
HCT: 26.3 % — ABNORMAL LOW (ref 36.0–46.0)
HEMOGLOBIN: 8.6 g/dL — AB (ref 12.0–15.0)
MCH: 25.9 pg — ABNORMAL LOW (ref 26.0–34.0)
MCHC: 32.7 g/dL (ref 30.0–36.0)
MCV: 79.2 fL (ref 78.0–100.0)
Platelets: 346 10*3/uL (ref 150–400)
RBC: 3.32 MIL/uL — AB (ref 3.87–5.11)
RDW: 13.5 % (ref 11.5–15.5)
WBC: 6 10*3/uL (ref 4.0–10.5)

## 2017-11-22 LAB — BASIC METABOLIC PANEL
Anion gap: 12 (ref 5–15)
BUN: 33 mg/dL — ABNORMAL HIGH (ref 6–20)
CHLORIDE: 95 mmol/L — AB (ref 101–111)
CO2: 23 mmol/L (ref 22–32)
CREATININE: 1.96 mg/dL — AB (ref 0.44–1.00)
Calcium: 8.9 mg/dL (ref 8.9–10.3)
GFR calc Af Amer: 27 mL/min — ABNORMAL LOW (ref >60)
GFR calc non Af Amer: 23 mL/min — ABNORMAL LOW (ref >60)
Glucose, Bld: 115 mg/dL — ABNORMAL HIGH (ref 65–99)
POTASSIUM: 3.8 mmol/L (ref 3.5–5.1)
SODIUM: 130 mmol/L — AB (ref 135–145)

## 2017-11-22 LAB — OSMOLALITY, URINE: Osmolality, Ur: 214 mOsm/kg — ABNORMAL LOW (ref 300–900)

## 2017-11-22 LAB — MAGNESIUM: MAGNESIUM: 2.3 mg/dL (ref 1.7–2.4)

## 2017-11-22 LAB — GLUCOSE, CAPILLARY
Glucose-Capillary: 141 mg/dL — ABNORMAL HIGH (ref 65–99)
Glucose-Capillary: 141 mg/dL — ABNORMAL HIGH (ref 65–99)

## 2017-11-22 LAB — SODIUM, URINE, RANDOM: Sodium, Ur: 40 mmol/L

## 2017-11-22 LAB — CREATININE, URINE, RANDOM: Creatinine, Urine: 43.07 mg/dL

## 2017-11-22 MED ORDER — BENZONATATE 100 MG PO CAPS: 100.0000 mg | ORAL_CAPSULE | Freq: Three times a day (TID) | ORAL | 0 refills | Status: DC | PRN

## 2017-11-22 MED ORDER — PANTOPRAZOLE SODIUM 40 MG PO TBEC: 40.0000 mg | DELAYED_RELEASE_TABLET | Freq: Two times a day (BID) | ORAL | 11 refills | Status: DC

## 2017-11-22 MED ORDER — LINAGLIPTIN 5 MG PO TABS: 5.0000 mg | ORAL_TABLET | Freq: Every day | ORAL | 11 refills | Status: DC

## 2017-11-22 MED ORDER — ATORVASTATIN CALCIUM 80 MG PO TABS: 80.0000 mg | ORAL_TABLET | Freq: Every day | ORAL | 11 refills | Status: DC

## 2017-11-22 MED ORDER — TICAGRELOR 90 MG PO TABS: 90.0000 mg | ORAL_TABLET | Freq: Two times a day (BID) | ORAL | 11 refills | Status: DC

## 2017-11-22 NOTE — Progress Notes (Signed)
Pharmacist Heart Failure Core Measure Documentation  Assessment: Sheila White has an EF documented as 30-35% on 11/20/17 by ECHO.  Rationale: Heart failure patients with left ventricular systolic dysfunction (LVSD) and an EF < 40% should be prescribed an angiotensin converting enzyme inhibitor (ACEI) or angiotensin receptor blocker (ARB) at discharge unless a contraindication is documented in the medical record.  This patient is not currently on an ACEI or ARB for HF.  This note is being placed in the record in order to provide documentation that a contraindication to the use of these agents is present for this encounter.  ACE Inhibitor or Angiotensin Receptor Blocker is contraindicated (specify all that apply)    ACEI allergy AND ARB allergy   Angioedema   Moderate or severe aortic stenosis   Hyperkalemia   Hypotension   Renal artery stenosis   Worsening renal function, preexisting renal disease or dysfunction   Fayne Norrie 11/22/2017 10:07 AM

## 2017-11-22 NOTE — Discharge Summary (Signed)
Physician Discharge Summary  Sheila White XBJ:[id number] DOB: Oct 03, 1940 DOA: 11/19/2017  PCP: Julieanne Manson, MD  Admit date: 11/19/2017 Discharge date: 11/22/2017  Time spent: 40 minutes  Recommendations for Outpatient Follow-up:  1. Follow up outpatient CBC/CMP (attentnion to creatinine) 2. Follow up with cardiology as outpatient 3. Holding lasix until follow up Friday with PCP given worsening creatinine (if planning to resume lasix, would do once daily - this looks like what Dr. Tresa Endo had planned at last appointment) 4. 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  Follow up with PCP for additional outpatient w/u and risk reduction 5. C/o vision problems here in hospital, recommend outpatient ophtho follow up  6. Hyponatremia improving on discharge.  Holding lasix on d/c given renal function.  Follow final urine studies. 7. BP's soft.  Instructed not to start hydralazine until they follow up with PCP on Friday (daughter noted they haven't picked this up yet).    Discharge Diagnoses:  Active Problems:   Chest pain   Chest pain, rule out acute myocardial infarction   Discharge Condition: stable  Diet recommendation: heart healthy  Filed Weights   11/20/17 0633 11/21/17 0424 11/22/17 0533  Weight: 49.2 kg (108 lb 7.5 oz) 49.2 kg (108 lb 6.4 oz) 49 kg (108 lb 1.6 oz)    History of present illness:  Sheila White 77 y.o.femalewith Rogelio Waynick known history of combined diastolic and systolic congestive heart failure, ischemic cardiomyopathy, CAD status post STEMI, post infarction pericarditis, DM 2, CK Dpresents to the emergency department for evaluation of chest pain. Patient was in Hayes Czaja usual state of health until April 2019 when she states sustained an MI. Since then she has had intermittent tightness in her central chest associated with dyspnea, nausea and worsening over the past few days.  She ruled out for ACS.  Echo was stable.  Cardiology was consulted.   Obtained MRI given concern for pt leaning to R and blurry vision which was notable for old strokes.  Discharged with improved CP with plans for outpatient cards f/u and f/u with PCP.  Creatinine worsened on day of discharge, making urine.  Instructed to hold lasix and recheck labs with PCP at appointment on Friday.   Hospital Course:  This is a77 y.o.femalewith Takhia Spoon history of combined diastolic and systolic congestive heart failure, ischemic cardiomyopathy, CAD status post STEMI, post infarction pericarditis, DM 2, CKDnow being admitted with:  # Chest pain, rule out ACS - Admit toinpatientwith telemetry monitoring. - Trend troponins (flat), check TSH (wnl) - A1c 6.6 in April, LDL 90 and HDL 40 at that time as well - continue ASA and brilinta - continue lipitor  - continue coreg - imdur - cards c/s, appreciate recs - repeat echo with EF 30-35%, grade 1 diastolic dysfunction (stable EF and WMA from previous study)  # Blurry vision  Gait Abnormality:  Sound like both of these are relatively new and started around the same time as her presenting symptoms (her daughter and son confirm this).  On walking with PT she was noted to lean to the right and she has also described blurry vision.  - Notable for old bilateral cerebellar infarct - needs continued outpatient follow up for risk reduction/work up - continue statin and DAPT.  Sinus on tele.  - home health PT  #.Hyponatremia - she appears euvolemic.  Improved today, continue to monitor.   - Follow urine studies (urine urea pending)  #.H/o Diabetes - SSI, achs  - Heart healthy, carb  controlled diet - resume home meds at discharge  #. HFrEF: EF 30-35%, grade 1 diastolic dysfunction.   - ContinueCoreg, Lasix, Imdur - holding lasix on d/c.  No ace/arb given aki.   #. History ofGERD - ContinueProtonix  #. History of CKD - creatinine rising.  Holding lasix on discharge.  Follow up outpatient.  - Monitor BMP  # Hypomag:  replete  # Cough: cxr with some interstitial edema and small bilateral effusions.  Continue sx treatment and follow outpatient.   Procedures: Echo Study Conclusions  - Left ventricle: The cavity size was normal. Wall thickness was   increased in Sheila White pattern of mild LVH. Systolic function was   moderately to severely reduced. The estimated ejection fraction   was in the range of 30% to 35%. Mid to distal anterior, apical   and inferoapical severe hypokinesis, suggestive of LAD territory   infarct. Doppler parameters are consistent with abnormal left   ventricular relaxation (grade 1 diastolic dysfunction). The E/e&'   ratio is between 8-15, suggesting indeterminate LV filling   pressure. - Aortic valve: Sclerosis without stenosis. There was trivial   regurgitation. - Mitral valve: Mildly thickened leaflets . There was trivial   regurgitation. - Left atrium: The atrium was mildly dilated. - Inferior vena cava: The vessel was normal in size. The   respirophasic diameter changes were in the normal range (>= 50%),   consistent with normal central venous pressure.  Impressions:  - Compared to Liborio Saccente prior study in 09/2017, the LVEF and WMA&'s are   unchanged. LV filling pressure has improved.   Consultations:  cardiology  Discharge Exam: Vitals:   11/22/17 0915 11/22/17 1222  BP: (!) 98/55 (!) 104/58  Pulse: 74 72  Resp: 16   Temp:    SpO2: 100% 96%   Feeling better.   No chest pain. Persistent cough and discomfort with this.  Interpreter used.  Daughter present.   General: No acute distress. Cardiovascular: Heart sounds show Briton Sellman regular rate, and rhythm. No gallops or rubs. No murmurs. No JVD. Lungs: Clear to auscultation bilaterally with good air movement. No rales, rhonchi or wheezes. Abdomen: Soft, nontender, nondistended with normal active bowel sounds. No masses. No hepatosplenomegaly. Neurological: Alert and oriented 3. Moves all extremities 4. Cranial nerves II  through XII grossly intact. Skin: Warm and dry. No rashes or lesions. Extremities: No clubbing or cyanosis. No edema.  Psychiatric: Mood and affect are normal. Insight and judgment are appropriate.  Discharge Instructions   Discharge Instructions    Call MD for:  difficulty breathing, headache or visual disturbances   Complete by:  As directed    Call MD for:  persistant dizziness or light-headedness   Complete by:  As directed    Call MD for:  persistant nausea and vomiting   Complete by:  As directed    Call MD for:  redness, tenderness, or signs of infection (pain, swelling, redness, odor or green/yellow discharge around incision site)   Complete by:  As directed    Call MD for:  severe uncontrolled pain   Complete by:  As directed    Call MD for:  temperature >100.4   Complete by:  As directed    Diet - low sodium heart healthy   Complete by:  As directed    Diet - low sodium heart healthy   Complete by:  As directed    Discharge instructions   Complete by:  As directed    You were seen for  chest pain.  Your work up was negative and your echo was stable.  Please follow up with your cardiologist as scheduled.    Please follow up with your PCP in [place] few days.    You had an MRI that showed evidence of old strokes.  Continue your aspirin, brilinta, and atorvastatin.  Follow up with your PCP, they may do additional work up as an outpatient.    Your kidney function got slightly worse in the hospital.  Stop your lasix until you follow up with your PCP on Friday then get labs checked to determine how to proceed (if you resume your lasix, I would do once daily).  Please follow up within Gatha Mcnulty couple days with your PCP to ensure this does not worsen.    Return for new, recurrent, or worsening symptoms.   Please ask your PCP to request records from this hospitalization so they know what was done and what the next steps will be.   Increase activity slowly   Complete by:  As directed     Increase activity slowly   Complete by:  As directed      Allergies as of 11/22/2017   No Known Allergies     Medication List    STOP taking these medications   furosemide 40 MG tablet Commonly known as:  LASIX     TAKE these medications   aspirin 81 MG chewable tablet Chew 1 tablet (81 mg total) by mouth daily.   atorvastatin 80 MG tablet Commonly known as:  LIPITOR Take 1 tablet (80 mg total) by mouth daily.   benzonatate 100 MG capsule Commonly known as:  TESSALON Take 1 capsule (100 mg total) by mouth 3 (three) times daily as needed for cough.   carvedilol 3.125 MG tablet Commonly known as:  COREG Take 1.5 tablet two times daily   chlorpheniramine-HYDROcodone 10-8 MG/5ML Suer Commonly known as:  TUSSIONEX PENNKINETIC ER Take 5 mLs by mouth at bedtime as needed for cough.   dicyclomine 10 MG capsule Commonly known as:  BENTYL Take 1 capsule (10 mg total) by mouth 3 (three) times daily before meals.   glimepiride 2 MG tablet Commonly known as:  AMARYL Take 1 tablet (2 mg total) by mouth daily with breakfast.   hydrALAZINE 25 MG tablet Commonly known as:  APRESOLINE Take 0.5 tablets (12.5 mg total) by mouth 2 (two) times daily.   isosorbide mononitrate 30 MG 24 hr tablet Commonly known as:  IMDUR Take 0.5 tablets (15 mg total) by mouth daily.   linagliptin 5 MG Tabs tablet Commonly known as:  TRADJENTA Take 1 tablet (5 mg total) by mouth daily.   loratadine 10 MG tablet Commonly known as:  CLARITIN Take 1 tablet (10 mg total) by mouth daily. What changed:    when to take this  reasons to take this   nitroGLYCERIN 0.4 MG SL tablet Commonly known as:  NITROSTAT Place 1 tablet (0.4 mg total) under the tongue every 5 (five) minutes x 3 doses as needed for chest pain.   pantoprazole 40 MG tablet Commonly known as:  PROTONIX Take 1 tablet (40 mg total) by mouth 2 (two) times daily.   potassium chloride 10 MEQ tablet Commonly known as:  K-DUR Take 1  tablet (10 mEq total) by mouth 2 (two) times daily.   sucralfate 1 g tablet Commonly known as:  CARAFATE Take 1 tablet (1 g total) by mouth 4 (four) times daily -  with meals and at bedtime.  ticagrelor 90 MG Tabs tablet Commonly known as:  BRILINTA Take 1 tablet (90 mg total) by mouth 2 (two) times daily.      No Known Allergies Follow-up Information    Julieanne Manson, MD Follow up.   Specialty:  Internal Medicine Contact information: [address] [place] [address] [phone]        Lennette Bihari, MD .   Specialty:  Cardiology Contact information: [address] [place] [address] [phone]        [facility] up.   Specialty:  Home Health Services Why:  Registered Nurse, Physical Therapy, Social Worker.  Contact information: [address] [place] [address] [phone]            The results of significant diagnostics from this hospitalization (including imaging, microbiology, ancillary and laboratory) are listed below for reference.    Significant Diagnostic Studies: Dg Chest 2 View  Result Date: 11/19/2017 CLINICAL DATA:  Abdominal pain and nausea. EXAM: CHEST - 2 VIEW COMPARISON:  10/31/2017 FINDINGS: Mildly enlarged cardiac silhouette. Calcific atherosclerotic disease and tortuosity of the aorta. Mild interstitial pulmonary edema. Bilateral small pleural effusions. Osseous structures are without acute abnormality. Soft tissues are grossly normal. IMPRESSION: Interstitial pulmonary edema with bilateral small pleural effusions. Mild enlargement of the cardiac silhouette. Calcific atherosclerotic disease of the aorta. Electronically Signed   By: Ted Mcalpine M.D.   On: 11/19/2017 15:55   Dg Chest 2 View  Result Date: 10/31/2017 CLINICAL DATA:  Epigastric pain radiating into the chest since yesterday. Recent MI with coronary stent placement. CHF. Nonsmoker. EXAM: CHEST - 2 VIEW  COMPARISON:  PA and lateral chest x-ray of Oct 30, 2017 FINDINGS: The lungs are well-expanded. The interstitial markings remain increased. Patchy densities are present at both lung bases. Small amounts of pleural fluid are noted on the right. The cardiac silhouette is mildly enlarged. There is calcification in the wall of the aortic arch. The trachea is midline. The bony thorax exhibits no acute abnormality. IMPRESSION: CHF with mild interstitial edema. Bibasilar atelectasis. Small right and trace left pleural effusions. Thoracic aortic atherosclerosis. Electronically Signed   By: David  [place] M.D.   On: 10/31/2017 16:21   Dg Chest 2 View  Result Date: 10/30/2017 CLINICAL DATA:  Shortness of breath EXAM: CHEST - 2 VIEW COMPARISON:  10/28/2017, 10/18/2017 FINDINGS: Small bilateral pleural effusions. Cardiomegaly with vascular congestion and moderate pulmonary edema. Bibasilar consolidations. Aortic atherosclerosis. No pneumothorax. IMPRESSION: 1. Cardiomegaly with vascular congestion and mild to moderate pulmonary edema. 2. Small pleural effusions with bibasilar atelectasis or pneumonia. Electronically Signed   By: Jasmine Pang M.D.   On: 10/30/2017 03:50   Dg Chest 2 View  Result Date: 10/29/2017 CLINICAL DATA:  77 year old female with shortness of breath and cough for 1 week. EXAM: CHEST - 2 VIEW COMPARISON:  Chest x-ray 10/18/2017. FINDINGS: Patchy multifocal interstitial and airspace disease noted throughout the mid to lower lungs bilaterally. Small bilateral pleural effusions. No evidence of pulmonary edema. Heart size is normal. Upper mediastinal contours are within normal limits. Aortic atherosclerosis. IMPRESSION: 1. Findings are concerning for multilobar bronchopneumonia throughout the lung bases with associated small bilateral parapneumonic pleural effusions. 2. Aortic atherosclerosis. Electronically Signed   By: Trudie Reed M.D.   On: 10/29/2017 09:43   Ct Head Wo Contrast  Result Date:  10/31/2017 CLINICAL DATA:  77 year old presenting with acute onset of occipital headache, nausea and vomiting, and acute mental status changes. EXAM: CT  HEAD WITHOUT CONTRAST TECHNIQUE: Contiguous axial images were obtained from the base of the skull through the vertex without intravenous contrast. COMPARISON:  None. FINDINGS: Brain: Moderate cortical, deep and cerebellar atrophy. Remote strokes involving both cerebellar hemispheres. Mild changes of small vessel disease of the white matter diffusely. No mass lesion. No midline shift. No acute hemorrhage or hematoma. No extra-axial fluid collections. No evidence of acute infarction. Vascular: Moderate BILATERAL carotid siphon and vertebral artery atherosclerosis. No hyperdense vessel. Skull: No skull fracture or other focal osseous abnormality involving the skull. Sinuses/Orbits: Visualized paranasal sinuses, bilateral mastoid air cells and bilateral middle ear cavities well-aerated. Bony nasal septal deviation to the LEFT. Visualized orbits and globes normal in appearance. Other: None. IMPRESSION: 1. No acute intracranial abnormality. 2. Remote strokes involving both cerebellar hemispheres. 3. Moderate generalized atrophy and mild chronic microvascular ischemic changes of the white matter. Electronically Signed   By: Hulan Saas M.D.   On: 10/31/2017 16:20   Mr Brain Wo Contrast  Result Date: 11/21/2017 CLINICAL DATA:  Fall with altered mental status EXAM: MRI HEAD WITHOUT CONTRAST TECHNIQUE: Multiplanar, multiecho pulse sequences of the brain and surrounding structures were obtained without intravenous contrast. COMPARISON:  Head CT 10/31/2017 FINDINGS: BRAIN: The midline structures are normal. There is no acute infarct or acute hemorrhage. There is no mass lesion or other mass effect. There is no hydrocephalus, dural abnormality or extra-axial collection. There are old bilateral cerebellar infarcts. Mild periventricular white matter hyperintensity.  Generalized atrophy without lobar predilection. There is ex vacuo dilatation of the ventricles. No chronic microhemorrhage or superficial siderosis. VASCULAR: Major intracranial arterial and venous sinus flow voids are preserved. SKULL AND UPPER CERVICAL SPINE: The visualized skull base, calvarium, upper cervical spine and extracranial soft tissues are normal. SINUSES/ORBITS: No fluid levels or advanced mucosal thickening. No mastoid or middle ear effusion. The orbits are normal. IMPRESSION: 1. Old bilateral cerebellar infarcts without acute intracranial abnormality. 2. Generalized atrophy of the brain without lobar predilection. Electronically Signed   By: Deatra Robinson M.D.   On: 11/21/2017 21:52   Dg Ugi W/high Density W/kub  Result Date: 11/01/2017 CLINICAL DATA:  Abdominal pain concerning for peptic ulcer disease.Recent myocardial infarction with stenting that precludes EGD. EXAM: UPPER GI SERIES WITH KUB TECHNIQUE: After obtaining Helvi Royals scout radiograph Hargun Spurling routine upper GI series was performed using thin and high density barium. FLUOROSCOPY TIME:  Fluoroscopy Time:  1 minutes 24 seconds Radiation Exposure Index (if provided by the fluoroscopic device): 13.6 Number of Acquired Spot Images: 6 COMPARISON:  Abdominal CT 10/12/2017 FINDINGS: Lateral pharyngeal imaging is negative for aspiration or diverticulum. No obstructive process. The esophagus has smooth mucosal contour and normal distensibility. Normal motility in the upright position. The stomach has normal shape, distensibility, and location. There is no ulcer, mass or fold thickening. No gastric outlet obstruction. Normal course of the duodenum with normal fold pattern. IMPRESSION: Normal upper GI.  No findings of gastritis or ulcer. Electronically Signed   By: Marnee Spring M.D.   On: 11/01/2017 16:06    Microbiology: No results found for this or any previous visit (from the past 240 hour(s)).   Labs: Basic Metabolic Panel: Recent Labs  Lab  11/17/17 1715 11/19/17 1458 11/20/17 0002 11/20/17 1040 11/21/17 0358 11/22/17 0602  NA 129* 127*  --  126* 129* 130*  K 5.4* 4.4  --  4.0 4.0 3.8  CL 95* 94*  --  94* 94* 95*  CO2 19* 25  --  21* 24 23  GLUCOSE 158* 110*  --  120* 109* 115*  BUN 27 27*  --  26* 31* 33*  CREATININE 1.36* 1.43* 1.58* 1.54* 1.73* 1.96*  CALCIUM 9.1 8.6*  --  8.5* 8.9 8.9  MG  --   --   --   --  1.6* 2.3   Liver Function Tests: Recent Labs  Lab 11/17/17 1715 11/19/17 1523  AST 15 19  ALT 12 13*  ALKPHOS 72 59  BILITOT 0.3 0.4  PROT 7.0 6.4*  ALBUMIN 4.0 3.0*   Recent Labs  Lab 11/19/17 1521  LIPASE 49   No results for input(s): AMMONIA in the last 168 hours. CBC: Recent Labs  Lab 11/17/17 1715 11/19/17 1458 11/20/17 0002 11/20/17 1040 11/21/17 0358 11/22/17 0602  WBC 6.6 6.6 6.3 6.5 6.3 6.0  NEUTROABS 4.2  --   --   --   --   --   HGB 9.2* 8.8* 9.3* 8.4* 9.1* 8.6*  HCT 28.2* 27.1* 28.4* 26.0* 27.5* 26.3*  MCV 81 80.4 79.6 81.3 79.5 79.2  PLT 366 321 349 317 330 346   Cardiac Enzymes: Recent Labs  Lab 11/20/17 0002 11/20/17 0455 11/20/17 1040  TROPONINI 0.04* 0.04* 0.04*   BNP: BNP (last 3 results) Recent Labs    10/30/17 0415 10/31/17 1431 11/19/17 1458  BNP 1,990.1* 1,648.0* 1,223.8*    ProBNP (last 3 results) No results for input(s): PROBNP in the last 8760 hours.  CBG: Recent Labs  Lab 11/21/17 1134 11/21/17 1626 11/21/17 2155 11/22/17 0735 11/22/17 1222  GLUCAP 166* 138* 127* 141* 141*       Signed:  Lacretia Nicks MD.  [facility] 11/22/2017, 8:35 PM

## 2017-11-22 NOTE — Progress Notes (Signed)
Per MD Lowell Guitar pt is unsure if she has had her pneumonia vaccination.

## 2017-11-22 NOTE — Care Management Note (Signed)
Case Management Note  Patient Details  Name: Sheila White MRN: [id number] Date of Birth: 1940/12/02  Subjective/Objective:   Pt presented for rule out chest pain- Plan will be to return home with support of daughter. Pt was previously active with [facility].                  Action/Plan: CM did speak with patient and daughter via Interpreter and pt is aware that [facility] will call the patient within 24-48 hours of transition home. Pt will need HH RN, PT, CSW order. No further needs from CM at this time.   Expected Discharge Date:                  Expected Discharge Plan:  Home w Home Health Services  In-House Referral:  Interpreting Services  Discharge planning Services  CM Consult  Post Acute Care Choice:  Home Health, Resumption of Svcs/PTA Provider Choice offered to:  Patient  DME Arranged:  N/A DME Agency:  NA  HH Arranged:  RN, Disease Management, PT, Social Work [employer] Agency:  [facility]  Status of Service:  Completed, signed off  If discussed at [employer], dates discussed:    Additional Comments:  Gala Lewandowsky, RN 11/22/2017, 11:58 AM

## 2017-11-23 ENCOUNTER — Other Ambulatory Visit (HOSPITAL_COMMUNITY): Payer: Self-pay

## 2017-11-23 LAB — UREA NITROGEN, URINE: UREA NITROGEN UR: 221 mg/dL

## 2017-11-24 NOTE — Telephone Encounter (Signed)
Spoke with daughter. Daughter verbalized understanding

## 2017-11-24 NOTE — Telephone Encounter (Signed)
Left message for daughter to call the office.

## 2017-11-25 ENCOUNTER — Encounter: Payer: Self-pay | Admitting: Internal Medicine

## 2017-11-25 ENCOUNTER — Ambulatory Visit: Payer: Self-pay | Admitting: Internal Medicine

## 2017-11-25 VITALS — BP 130/76 | HR 78 | Resp 12 | Ht 58.5 in | Wt 109.0 lb

## 2017-11-25 DIAGNOSIS — J302 Other seasonal allergic rhinitis: Secondary | ICD-10-CM

## 2017-11-25 DIAGNOSIS — R05 Cough: Secondary | ICD-10-CM

## 2017-11-25 DIAGNOSIS — N289 Disorder of kidney and ureter, unspecified: Secondary | ICD-10-CM

## 2017-11-25 DIAGNOSIS — R059 Cough, unspecified: Secondary | ICD-10-CM

## 2017-11-25 DIAGNOSIS — I5041 Acute combined systolic (congestive) and diastolic (congestive) heart failure: Secondary | ICD-10-CM

## 2017-11-25 DIAGNOSIS — E119 Type 2 diabetes mellitus without complications: Secondary | ICD-10-CM

## 2017-11-25 DIAGNOSIS — D649 Anemia, unspecified: Secondary | ICD-10-CM

## 2017-11-25 MED ORDER — FERROUS GLUCONATE 324 (37.5 FE) MG PO TABS: ORAL_TABLET | ORAL | Status: DC

## 2017-11-25 MED ORDER — CARVEDILOL 3.125 MG PO TABS: ORAL_TABLET | ORAL | 3 refills | Status: DC

## 2017-11-25 NOTE — Patient Instructions (Signed)
Get back on Claritin daily

## 2017-11-25 NOTE — Progress Notes (Signed)
Subjective:    Patient ID: Sheila White, female    DOB: 03-17-1941, 77 y.o.   MRN: [id number]  HPI   Hospitalized again Saturday, 2 days after last seen due to difficulty with onset of unstable gait.   Daughter also noted she was having difficulties with breathing. Hospitalized through past Tuesday, 3 days ago. In the hospital, she found to have increased renal insufficiency with the increase in her furosemide as well as worsening hyponatremia.   Furosemide was stopped and fluids limited. 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 Last sodium at discharge on the 28th was 130 with BUN of 33 and Crea up to 1.96; crea was 1.54 on admission. Looking back at previous hospitalization in early May, she had low iron and sat.  Her hemoglobin and protein continue to be low. She is eating better.  Was seen by Dr. Tresa Endo the day prior to increased problems with breathing and balance.   He increased her carvedilol to 3.125 mg 1.5 tabs twice daily.  He also started her on hydralazine, but she has yet to start as ended up in hospital next day and was held.    Today, has started with runny nose.  Her cough was better yesterday.  Today, seems to be coughing a bit more. Daughter states her coughing was the same at time of most recent hospital discharge as to when I saw her on the 23rd, but since discharge and until today, had seemed to gradually improve. Again, no fevers.   She complains of throat irritation, but no posterior pharyngeal drainage. She is sneezing Is having itching of throat and nose. Daughter states today she believes her mother has had problems with allergies in the spring. Daughter did not give her Claritin today and states has not given to her lately   Did receive records from [facility] in [place].  She was only seen there since 09/02/2017  for 3 separate visits, including a CPE on the 8th of March. This was before her MI. She was found to have what was considered chronic renal insufficiency then and also hyponatremia. She was started on Lisinopril 5 mg, assuming for protein in urine. She developed a cough and complaints of neck pain that was assumed due to ACE I and her ACE I was stopped.  Daughter states she continued to worsen with cough and took her back to previous physician at [facility] who diagnosed her with pneumonia and UTI and placed on Nitrofurantoin and Prednisone.  Mother continued to worsen and was in ED for admission 2 days later with MI.   Daughter states her mother previously followed at [facility] in [place] for 2 years.  The practice has since moved to [place]. She was only seen at the [facility] for the 3 visits since March.  DM:  Sugars running low to mid 100s in the morning and mid 100s at night.  Current Meds  Medication Sig  . aspirin 81 MG chewable tablet Chew 1 tablet (81 mg total) by mouth daily.  Marland Kitchen atorvastatin (LIPITOR) 80 MG tablet Take 1 tablet (80 mg total) by mouth daily.  . benzonatate (TESSALON) 100 MG capsule Take 1 capsule (100 mg total) by mouth 3 (three) times daily as needed for cough.  . carvedilol (COREG) 3.125 MG tablet Take 1.5 tablet two times daily  .  glimepiride (AMARYL) 2 MG tablet Take 1 tablet (2 mg total) by mouth daily with breakfast.  . isosorbide mononitrate (IMDUR) 30 MG 24 hr tablet Take 0.5 tablets (15 mg total) by mouth daily.  Marland Kitchen linagliptin (TRADJENTA) 5 MG TABS tablet Take 1 tablet (5 mg total) by mouth daily.  Marland Kitchen loratadine (CLARITIN) 10 MG tablet Take 1 tablet (10 mg total) by mouth daily. (Patient taking differently: Take 10 mg by mouth daily as needed for allergies. )  . pantoprazole (PROTONIX) 40 MG tablet Take 1 tablet (40 mg total) by mouth 2 (two) times daily.  . ticagrelor (BRILINTA) 90 MG TABS tablet Take 1 tablet (90 mg total)  by mouth 2 (two) times daily.    No Known Allergies  Review of Systems     Objective:   Physical Exam  Down 3 lbs from last visit. NAD until has a coughing feet.  Clear runny nose--wiping at nose constantly. HEENT:  PERRL, EOMI, conjunctivae without injection.  TMs pearly gray, throat without erythema or cobbling.  Nasal mucosa swollen and red/boggy on left, less so on right.  Neck:  Supple, No adenopathy Chest:  CTA today CV:  RRR without murmur or rub, no S3 or S4.  Mild JVD.  No LE edema Abd:  S, NT, No HSM or mass.   .      Assessment & Plan:  1.  CHF:  Lungs quite clear today and seems to not get as dyspneic with walking or following coughing episodes.   As her BP and pulse seem to be tolerating her increase in Carvedilol, will increase her to 6.25 mg twice daily.   To call if light headedness or weakness. BP and pulse with BMP in 1 week. Hold on adding Furosemide back for now. Hold on Hydralazine as well. To improve intravascular oncotic pressure, start Ferrous sulfate 324 mg twice daily with half an orange to improve anemia.  Hemoglobin in March was normal at 12.3. Also to work on dietary intake of protein.  2.  Allergies vs viral illness:  Restart Claritin.  3.  DM:  Discussed Tradjenta sent to [facility] along with Brilinta, Pantoprazole, Atorvastatin.  4.  Anemia:  As above.

## 2017-11-26 LAB — BASIC METABOLIC PANEL
BUN / CREAT RATIO: 20 (ref 12–28)
BUN: 32 mg/dL — ABNORMAL HIGH (ref 8–27)
CALCIUM: 9.2 mg/dL (ref 8.7–10.3)
CHLORIDE: 94 mmol/L — AB (ref 96–106)
CO2: 22 mmol/L (ref 20–29)
Creatinine, Ser: 1.57 mg/dL — ABNORMAL HIGH (ref 0.57–1.00)
GFR calc non Af Amer: 31 mL/min/{1.73_m2} — ABNORMAL LOW (ref >59)
GFR, EST AFRICAN AMERICAN: 36 mL/min/{1.73_m2} — AB (ref >59)
Glucose: 174 mg/dL — ABNORMAL HIGH (ref 65–99)
POTASSIUM: 5.1 mmol/L (ref 3.5–5.2)
Sodium: 131 mmol/L — ABNORMAL LOW (ref 134–144)

## 2017-11-28 ENCOUNTER — Telehealth: Payer: Self-pay | Admitting: Cardiovascular Disease

## 2017-11-28 NOTE — Telephone Encounter (Signed)
Spoke with Pt who states she is calling to request orders for PT 2 times a week for 6 weeks. Routing to Dr. Tresa EndoKelly.

## 2017-11-28 NOTE — Telephone Encounter (Signed)
Spoke with Melaine, PT and provided verbal orders for PT sessions per Dr. Tresa EndoKelly.

## 2017-11-28 NOTE — Telephone Encounter (Signed)
New message    Needs verbal orders for PT - 2x a week for 6 weeks

## 2017-11-28 NOTE — Telephone Encounter (Signed)
Ok to have PT

## 2017-12-01 ENCOUNTER — Ambulatory Visit (INDEPENDENT_AMBULATORY_CARE_PROVIDER_SITE_OTHER): Payer: Self-pay | Admitting: Physician Assistant

## 2017-12-01 ENCOUNTER — Encounter: Payer: Self-pay | Admitting: Physician Assistant

## 2017-12-01 VITALS — BP 115/68 | HR 77 | Ht 58.5 in | Wt 112.2 lb

## 2017-12-01 DIAGNOSIS — D649 Anemia, unspecified: Secondary | ICD-10-CM

## 2017-12-01 DIAGNOSIS — I2510 Atherosclerotic heart disease of native coronary artery without angina pectoris: Secondary | ICD-10-CM

## 2017-12-01 DIAGNOSIS — E785 Hyperlipidemia, unspecified: Secondary | ICD-10-CM

## 2017-12-01 DIAGNOSIS — I5042 Chronic combined systolic (congestive) and diastolic (congestive) heart failure: Secondary | ICD-10-CM

## 2017-12-01 LAB — CBC
HEMATOCRIT: 28.8 % — AB (ref 34.0–46.6)
Hemoglobin: 9.8 g/dL — ABNORMAL LOW (ref 11.1–15.9)
MCH: 27.3 pg (ref 26.6–33.0)
MCHC: 34 g/dL (ref 31.5–35.7)
MCV: 80 fL (ref 79–97)
Platelets: 401 10*3/uL (ref 150–450)
RBC: 3.59 x10E6/uL — AB (ref 3.77–5.28)
RDW: 14.1 % (ref 12.3–15.4)
WBC: 5.9 10*3/uL (ref 3.4–10.8)

## 2017-12-01 LAB — PRO B NATRIURETIC PEPTIDE: NT-Pro BNP: 20354 pg/mL — ABNORMAL HIGH (ref 0–738)

## 2017-12-01 MED ORDER — ISOSORBIDE MONONITRATE ER 30 MG PO TB24: 30.0000 mg | ORAL_TABLET | Freq: Every day | ORAL | 3 refills | Status: DC

## 2017-12-01 MED ORDER — CARVEDILOL 6.25 MG PO TABS: 6.2500 mg | ORAL_TABLET | Freq: Two times a day (BID) | ORAL | 3 refills | Status: DC

## 2017-12-01 NOTE — Progress Notes (Signed)
Cardiology Office Note   Date:  12/01/2017   ID:  Sheila White, Sheila White 05/23/1941, MRN [id number]  PCP:  Sheila Manson, MD  Cardiologist: Dr. Tresa White, 11/18/2017 Sheila Demark, PA-C 10/28/2017  Chief Complaint  Patient presents with  . Hospitalization Follow-up    History of Present Illness: Sheila White is a 77 y.o. female with a history of HOH, NIDDM, STEMI 10/05/2017 s/p DES LAD x 2, post-infarction pericarditis, S-D-CHF, EF 30% w/ grade 2 dd, hyponatremia, AKI w/ Cr peak 3.86, 1.88 at d/c, anemia  Admitted 05/25-05/28/2019 for chest pain, needs f/u w/ eye MD, Lasix on hold due to RI and low Na (130), not on hydralazine>>BP too low, old CVA on CT>> f/u PCP 05/31 PCP note, Coreg increased 6.25 bid, not on Lasix, iron added   Sheila White presents for cardiology follow up.  Her daughter is here and translates for her.  Her weight today was 109.4 lbs, that is stable on her home scales. She has had some bedtime ankle edema, does not wake with it. No orthopnea or PND. She felt hot last pm, but the room was not hot.  Unclear why this happened.  She never gets chest pain.   She has some SOB at times, feels the need to take deep breaths. O2 sats checked in the office were 95% or higher.  The daughter states that a lot of times she will feel like this but if you distract her, she gets better and is okay.  She has been able to cook and do other things around the house without chest pain or shortness of breath.  Her balance is not good so her activity level is poor, but does not seem symptomatic with her current activity level.   Past Medical History:  Diagnosis Date  . Acute combined systolic and diastolic CHF, NYHA class 3 (HCC) 10/10/2017   in setting of MI  . HOH (hard of hearing)   . Ischemic cardiomyopathy 10/05/2017  . Non-insulin dependent type 2 diabetes mellitus (HCC) 2004  . 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 STEMI  involving left anterior descending coronary artery (HCC) 10/05/2017    Past Surgical History:  Procedure Laterality Date  . CORONARY/GRAFT ACUTE MI REVASCULARIZATION N/A 10/05/2017   Procedure: Coronary/Graft Acute MI Revascularization;  Surgeon: Lennette Bihari, MD;  Location[facility] INVASIVE CV LAB;  Service: Cardiovascular;  Laterality: N/A;  . LEFT HEART CATH AND CORONARY ANGIOGRAPHY N/A 10/05/2017   Procedure: LEFT HEART CATH AND CORONARY ANGIOGRAPHY;  Surgeon: Lennette Bihari, MD;  Location: [facility] INVASIVE CV LAB;  Service: Cardiovascular;  Laterality: N/A;    Current Outpatient Medications  Medication Sig Dispense Refill  . aspirin 81 MG chewable tablet Chew 1 tablet (81 mg total) by mouth daily. 60 tablet 2  . atorvastatin (LIPITOR) 80 MG tablet Take 1 tablet (80 mg total) by mouth daily. 30 tablet 11  . benzonatate (TESSALON) 100 MG capsule Take 1 capsule (100 mg total) by mouth 3 (three) times daily as needed for cough. 20 capsule 0  . carvedilol (COREG) 3.125 MG tablet 2 tabs by mouth twice daily 270 tablet 3  . Ferrous Gluconate 324 (37.5 Fe) MG TABS 1 tab by mouth twice daily with half and orange    . glimepiride (AMARYL) 2 MG tablet Take 1 tablet (2 mg total) by mouth daily with breakfast. 30 tablet 1  . isosorbide mononitrate (IMDUR) 30 MG 24 hr tablet Take 0.5  tablets (15 mg total) by mouth daily. 60 tablet 3  . linagliptin (TRADJENTA) 5 MG TABS tablet Take 1 tablet (5 mg total) by mouth daily. 30 tablet 11  . loratadine (CLARITIN) 10 MG tablet Take 1 tablet (10 mg total) by mouth daily. (Patient taking differently: Take 10 mg by mouth daily as needed for allergies. ) 60 tablet 2  . nitroGLYCERIN (NITROSTAT) 0.4 MG SL tablet Place 1 tablet (0.4 mg total) under the tongue every 5 (five) minutes x 3 doses as needed for chest pain. 25 tablet 0  . pantoprazole (PROTONIX) 40 MG tablet Take 1 tablet (40 mg total) by mouth 2 (two) times daily. 60 tablet 11  . ticagrelor (BRILINTA) 90 MG TABS  tablet Take 1 tablet (90 mg total) by mouth 2 (two) times daily. 60 tablet 11   No current facility-administered medications for this visit.     Allergies:   Patient has no known allergies.    Social History:  The patient  reports that she has never smoked. She has never used smokeless tobacco. She reports that she does not drink alcohol or use drugs.   Family History:  The patient's family history includes Alcohol abuse in her father.    ROS:  Please see the history of present illness. All other systems are reviewed and negative.    PHYSICAL EXAM: VS:  BP 115/68   Pulse 77   Ht 4' 10.5" (1.486 m)   Wt 112 lb 3.2 oz (50.9 kg)   BMI 23.05 kg/m  , BMI Body mass index is 23.05 kg/m. GEN: Well nourished, well developed, female in no acute distress  HEENT: normal for age  Neck: Minimal JVD, no carotid bruit, no masses Cardiac: RRR; soft murmur, no rubs, or gallops Respiratory: Rales bases bilaterally, normal work of breathing GI: soft, nontender, nondistended, + BS MS: no deformity or atrophy; no edema; distal pulses are 2+ in all 4 extremities   Skin: warm and dry, no rash Neuro:  Strength and sensation are intact Psych: euthymic mood, full affect   EKG:  EKG is not ordered today.  CATH: 10/05/2017  Mid LAD lesion is 90% stenosed.  Prox LAD lesion is 99% stenosed.  Dist LAD lesion is 99% stenosed.  1st Mrg lesion is 95% stenosed.  Ost 2nd Mrg to 2nd Mrg lesion is 30% stenosed.  Ost RPDA to RPDA lesion is 30% stenosed.  Post intervention, there is a 0% residual stenosis.  Post intervention, there is a 0% residual stenosis.  Post intervention, there is a 35% residual stenosis.  A stent was successfully placed.  A stent was successfully placed.   Acute late presentation anterior ST segment elevation myocardial infarction with demonstration of subtotal long proximal LAD stenosis, 90% mid stenosis, and 99% apical stenosis with reduced apical flow.  Left  circumflex disease with 50% proximal OM1 stenosis followed by distal 95% marginal stenosis prior to giving off distal branches in the OM1 vessel.  Mild diffuse irregularity of a large dominant RCA with 30% narrowing in the PDA vessel.  LVEDP 34 mmHg/  Successful PCI to the LAD with ultimate insertion of a 2.526 mm Resolute DES stent postdilated to 2.8 mm proximally and 2.68 mm distally with the 99% stenosis being reduced to 0%; DES stenting of the mid 90% stenosis with ultimate insertion of a 2.2512 mm Resolute DES stent with residual narrowing at 0%, and initial 99% apical thrombotic stenosis with reduced flow treated with low-level PTCA and intracoronary verapamil with improvement  to approximately 35-40% in a very small apical LAD segment.  RECOMMENDATION: DAPT for minimum of 1 year.  Diuresis with elevated EDP.  An echo Doppler study will be obtained in a.m.  Patient should be started on low-dose ACE inhibition, carvedilol, probable nitrate therapy, and high potency statin treatment. Post-Intervention Diagram        Recent Labs: 11/19/2017: ALT 13; B Natriuretic Peptide 1,223.8 11/20/2017: TSH 2.617 11/22/2017: Hemoglobin 8.6; Magnesium 2.3; Platelets 346 11/25/2017: BUN 32; Creatinine, Ser 1.57; Potassium 5.1; Sodium 131    Lipid Panel    Component Value Date/Time   CHOL 161 10/06/2017 0256   TRIG 110 10/06/2017 0256   HDL 49 10/06/2017 0256   CHOLHDL 3.3 10/06/2017 0256   VLDL 22 10/06/2017 0256   LDLCALC 90 10/06/2017 0256     Wt Readings from Last 3 Encounters:  12/01/17 112 lb 3.2 oz (50.9 kg)  11/25/17 109 lb (49.4 kg)  11/22/17 108 lb 1.6 oz (49 kg)     Other studies Reviewed: Additional studies/ records that were reviewed today include: Office notes, hospital records and testing.  ASSESSMENT AND PLAN:  1.  Chronic combined systolic and diastolic CHF: According to her daughter, her weight has been stable on her home scales.  She is encouraged to continue daily  weights.  Let us know if she begins gaining weight or begins waking with lower extremity edema. **Discuss with Dr. Tresa White if some of her shortness of breath could be coming from the Brilinta.  Her BNP was very high, recheck today.  Her PCP has an order in for BMET, keep that appointment.  2.  CAD: She is having no ischemic symptoms.  Continue therapy with aspirin, continue increased carvedilol 6.25 mg twice daily but write a prescription for these tablets and increase Imdur your to 30 mg daily.  Continue Brilinta.  The daughter understands that no doses can be missed.  3.  Dyslipidemia: Her LDL was 90 and the goal is less than 70.  Continue high-dose statin  4.  Anemia: I explained that anemia could also be contributing to her shortness of breath, we will recheck a CBC today to make sure there is no ongoing blood loss.  If her CBC is improved, need to think more seriously about Brilinta causing her shortness of breath.   Current medicines are reviewed at length with the patient today.  The patient does not have concerns regarding medicines.  The following changes have been made: Increase Imdur  Labs/ tests ordered today include:   Orders Placed This Encounter  Procedures  . CBC  . Pro b natriuretic peptide (BNP)     Disposition:   FU with Dr. Tresa White, 11/18/2017  Signed, Sheila Demark, PA-C  12/01/2017 3:06 PM    [facility] Phone: [phone]; Fax: [phone]  This note was written with the assistance of speech recognition software. Please excuse any transcriptional errors.

## 2017-12-01 NOTE — Patient Instructions (Signed)
Medication Instructions:   INCREASE ISOSORBIDE TO 30 MG ONCE DAILY = 1 WHOLE TABLET ONCE DAILY  CARVEDILOL PRESCRIPTION CHANGE TO THE 6.25MG  TABLET= 1 TABLET TWICE DAILY  Labwork:  Your physician recommends that you HAVE LAB WORK TODAY  Follow-Up:  Your physician recommends that you schedule a follow-up appointment in: AS SCHEDULED WITH DR Tresa EndoKELLY   TRACK BLOOD PRESSURE AND DAILY WEIGHT

## 2017-12-05 ENCOUNTER — Ambulatory Visit (INDEPENDENT_AMBULATORY_CARE_PROVIDER_SITE_OTHER): Payer: Self-pay | Admitting: Internal Medicine

## 2017-12-05 ENCOUNTER — Encounter: Payer: Self-pay | Admitting: Internal Medicine

## 2017-12-05 VITALS — BP 118/68 | HR 82 | Resp 14 | Ht 58.5 in | Wt 110.0 lb

## 2017-12-05 DIAGNOSIS — I5041 Acute combined systolic (congestive) and diastolic (congestive) heart failure: Secondary | ICD-10-CM

## 2017-12-05 DIAGNOSIS — R0600 Dyspnea, unspecified: Secondary | ICD-10-CM

## 2017-12-05 DIAGNOSIS — F419 Anxiety disorder, unspecified: Secondary | ICD-10-CM

## 2017-12-05 DIAGNOSIS — E871 Hypo-osmolality and hyponatremia: Secondary | ICD-10-CM

## 2017-12-05 DIAGNOSIS — N289 Disorder of kidney and ureter, unspecified: Secondary | ICD-10-CM

## 2017-12-05 MED ORDER — SERTRALINE HCL 50 MG PO TABS: ORAL_TABLET | ORAL | 3 refills | Status: DC

## 2017-12-05 NOTE — Progress Notes (Signed)
Subjective:    Patient ID: Sheila White, female    DOB: 07-24-1940, 77 y.o.   MRN: [id number]  HPI   Received a call from daughter yesterday as her mother was complaining of dyspnea.  She thought she had some increased swelling of legs and so I asked her to take her into the ED for evaluation.   Daughter states her mom took her medication after they spoke with me and felt better, so they decided not to go in. Apparently, her mom also complained about midnight last night of shortness of breath, and she just stayed with her and calmed her until she seemed better as well.    Patient states she feels "anxiety" in her epigastrium and goes up into her chest.  She is afraid to sleep as she is afraid she will die in her sleep.  She is afraid she will be alone.  She states she actually has had difficulty with this for 4 years, prior to her MI and CHF.   Would have these episodes with dyspnea maybe once monthly, but would resolve quickly, but later states these episodes could last 1 day. She actually states she had difficulties with these episodes as a young woman but they were rare.  She states since her hospitalization, these episodes are so frequent.  Her daughter states she will suddenly awaken and be frightened that she, her daughter, is not there.  Granddaughter feels she seems anxious as well.   CHF:  Is not using Furosemide.  Daughter did feel she had some swelling of her ankles yesterday with a weight of 110 lbs.  She does not feel she is having edema today and weight here is also 110 lbs. Daughter states she is taking the 6.25 mg twice daily of Carvedilol without fail. Denies orthopnea or PND.  No chest pain or dyspnea with increased physical activity.  Current Meds  Medication Sig  . aspirin 81 MG chewable tablet Chew 1 tablet (81 mg total) by mouth daily.  Marland Kitchen atorvastatin (LIPITOR) 80 MG tablet Take 1 tablet (80 mg total) by mouth daily.  . benzonatate (TESSALON) 100 MG  capsule Take 1 capsule (100 mg total) by mouth 3 (three) times daily as needed for cough.  . carvedilol (COREG) 6.25 MG tablet Take 1 tablet (6.25 mg total) by mouth 2 (two) times daily with a meal.  . Ferrous Gluconate 324 (37.5 Fe) MG TABS 1 tab by mouth twice daily with half and orange  . glimepiride (AMARYL) 2 MG tablet Take 1 tablet (2 mg total) by mouth daily with breakfast.  . isosorbide mononitrate (IMDUR) 30 MG 24 hr tablet Take 1 tablet (30 mg total) by mouth daily.  Marland Kitchen linagliptin (TRADJENTA) 5 MG TABS tablet Take 1 tablet (5 mg total) by mouth daily.  Marland Kitchen loratadine (CLARITIN) 10 MG tablet Take 1 tablet (10 mg total) by mouth daily. (Patient taking differently: Take 10 mg by mouth daily as needed for allergies. )  . pantoprazole (PROTONIX) 40 MG tablet Take 1 tablet (40 mg total) by mouth 2 (two) times daily.  . ticagrelor (BRILINTA) 90 MG TABS tablet Take 1 tablet (90 mg total) by mouth 2 (two) times daily.    Review of Systems     Objective:   Physical Exam  NAD Lungs:  Bibasilar crackles. CV:  RRR with normal S1 and S2, No S3 or S4 or appreciated.  + JVD. Normal and equal radial pulses. LE:  No edema  Assessment & Plan:  1.  Dyspnea:  Not clear how much of this from CHF vs possible anxiety/Panic Disorders. Did note Cardiology concern that [facility]. Family seems to feel this is more related to anxiety. Start Sertraline 50 1/2 tab by mouth daily for 7 days and then follow up with me.  Will increase likely to 50 mg on followup. Spoke with Samul DadaN. Knight, LCSW after patient left for counseling/behavioral modification. Concerned that her HR is still in 80s despite history that she is not missing Carvedilol.  HR could be related to anxiety, however.

## 2017-12-06 ENCOUNTER — Telehealth: Payer: Self-pay | Admitting: Physician Assistant

## 2017-12-06 DIAGNOSIS — Z7902 Long term (current) use of antithrombotics/antiplatelets: Principal | ICD-10-CM

## 2017-12-06 DIAGNOSIS — Z5181 Encounter for therapeutic drug level monitoring: Secondary | ICD-10-CM

## 2017-12-06 DIAGNOSIS — Z79899 Other long term (current) drug therapy: Secondary | ICD-10-CM

## 2017-12-06 LAB — BASIC METABOLIC PANEL
BUN / CREAT RATIO: 21 (ref 12–28)
BUN: 25 mg/dL (ref 8–27)
CO2: 20 mmol/L (ref 20–29)
CREATININE: 1.21 mg/dL — AB (ref 0.57–1.00)
Calcium: 9.4 mg/dL (ref 8.7–10.3)
Chloride: 99 mmol/L (ref 96–106)
GFR, EST AFRICAN AMERICAN: 49 mL/min/{1.73_m2} — AB (ref >59)
GFR, EST NON AFRICAN AMERICAN: 43 mL/min/{1.73_m2} — AB (ref >59)
Glucose: 153 mg/dL — ABNORMAL HIGH (ref 65–99)
Potassium: 4.9 mmol/L (ref 3.5–5.2)
SODIUM: 132 mmol/L — AB (ref 134–144)

## 2017-12-06 NOTE — Telephone Encounter (Signed)
New Message   Pt c/o medication issue:  1. Name of Medication: Brilinta  2. How are you currently taking this medication (dosage and times per day)?    3. Are you having a reaction (difficulty breathing--STAT)?  4. What is your medication issue?  PCP office is calling on behalf of patient. They are advising that the patient is not able to obtain Brilinta because the patient is not a [place] citizen. They are wondering can the patient be changed to Eliquis. Please call

## 2017-12-06 NOTE — Telephone Encounter (Signed)
Returned call to PCP office. Her Estanfia, patient cannot take Brilinta d/t citizenship status. Spoke with Dr. Delrae AlfredMulberry who states that patient will need to be changed to different antiplatelet and they can get Plavix for patient thru a program with health department. Will route to cardiologist Dr. Tresa EndoKelly & RN to review and advise. PCP office, Dr. Delrae AlfredMulberry, will need to be notified of this change.

## 2017-12-07 ENCOUNTER — Ambulatory Visit: Payer: Self-pay | Admitting: Internal Medicine

## 2017-12-07 ENCOUNTER — Other Ambulatory Visit: Payer: Self-pay | Admitting: Licensed Clinical Social Worker

## 2017-12-07 NOTE — Telephone Encounter (Signed)
If cannot take Brilinta, then changed to generic Plavix 75 mg daily; check P2Y12 test after 1 week on Plavix to make certain that she is Plavix responsive.

## 2017-12-09 ENCOUNTER — Telehealth (HOSPITAL_COMMUNITY): Payer: Self-pay

## 2017-12-09 NOTE — Telephone Encounter (Signed)
Attempted to call patient through Interpreter services in regards Cardiac Rehab and insurance - vm is full. Sending letter.

## 2017-12-12 ENCOUNTER — Ambulatory Visit: Payer: Self-pay | Admitting: Internal Medicine

## 2017-12-13 ENCOUNTER — Telehealth: Payer: Self-pay | Admitting: Internal Medicine

## 2017-12-13 MED ORDER — SITAGLIPTIN PHOSPHATE 100 MG PO TABS: ORAL_TABLET | ORAL | 11 refills | Status: DC

## 2017-12-13 MED ORDER — CLOPIDOGREL BISULFATE 75 MG PO TABS: 75.0000 mg | ORAL_TABLET | Freq: Every day | ORAL | 11 refills | Status: DC

## 2017-12-13 NOTE — Telephone Encounter (Signed)
Due to cost, switching from Eliquis to Plavix--labs to be drawn at hospital 1 week after starting Plavix as per Cardiology. Also, switching to Januvia/Sitagliptan from Strathmoreradjenta as unable to get the latter through [facility] for this patient.  Choosing 50 mg daily due to renal impairment level.

## 2017-12-13 NOTE — Telephone Encounter (Signed)
Received call back from Dr. Delrae AlfredMulberry nurse-they are aware ok to change to Plavix and [facility].   They will take care of switching patient and send new prescription to the health department.   Order placed for lab test.

## 2017-12-13 NOTE — Telephone Encounter (Signed)
Spoke with daughter [place]. States patient

## 2017-12-13 NOTE — Telephone Encounter (Signed)
Spoke with daughter E[place]Rosa. States patient has 2 weeks left on brilinta then will start plavix. Will call back to let us know when she starts plavix so we can coordinate lab test. [name] verbalized understanding of starting Januvia and Plavix. Rxs have been sent to PHD.

## 2017-12-13 NOTE — Telephone Encounter (Signed)
Left message for patient daughter to call back.

## 2017-12-13 NOTE — Telephone Encounter (Signed)
Left message for Sheila White to call back

## 2017-12-16 ENCOUNTER — Other Ambulatory Visit (HOSPITAL_COMMUNITY): Payer: Self-pay

## 2017-12-19 ENCOUNTER — Ambulatory Visit: Payer: Self-pay | Admitting: Internal Medicine

## 2017-12-19 ENCOUNTER — Encounter: Payer: Self-pay | Admitting: Internal Medicine

## 2017-12-19 VITALS — BP 98/58 | HR 68 | Resp 12 | Ht 58.5 in | Wt 109.0 lb

## 2017-12-19 DIAGNOSIS — I255 Ischemic cardiomyopathy: Secondary | ICD-10-CM

## 2017-12-19 DIAGNOSIS — F419 Anxiety disorder, unspecified: Secondary | ICD-10-CM

## 2017-12-19 DIAGNOSIS — R0600 Dyspnea, unspecified: Secondary | ICD-10-CM

## 2017-12-19 DIAGNOSIS — F329 Major depressive disorder, single episode, unspecified: Secondary | ICD-10-CM

## 2017-12-19 DIAGNOSIS — F32A Depression, unspecified: Secondary | ICD-10-CM

## 2017-12-19 DIAGNOSIS — E119 Type 2 diabetes mellitus without complications: Secondary | ICD-10-CM

## 2017-12-19 HISTORY — DX: Anxiety disorder, unspecified: F41.9

## 2017-12-19 HISTORY — DX: Depression, unspecified: F32.A

## 2017-12-19 NOTE — Patient Instructions (Signed)
Increase Sertraline to a whole pill daily

## 2017-12-19 NOTE — Progress Notes (Signed)
   Subjective:    Patient ID: Sheila HuntsmanMaria Santos Umana De White, female    DOB: 1940/12/21, 77 y.o.   MRN: [id number]  HPI   1.  Anxiety and possibly some depression:  2 week follow up following start of Sertraline.  She is only taking 25 mg daily.  Her coughing and sense of dyspnea is much better.   Daughter states she has not been coughing, but then patient begins coughing soon after I enter room. She is not waking in the middle of the night.  She feels rested now when she wakes in the morning.   Energy is better.   She is more interested in her surroundings, wanting to go outside and be more active.  2. Ankle swelling:  A little swelling, but in general, better.  Her daughter is not clear, but does not believe she is using now.  Down one pound from visit 2 weeks ago. She does not feel light headed and has actually increased her walking per daughter.  3.  DM:  Clarifying her use of Tradjenta:  Patient was taking up until 2 weeks ago.  Prior to her hospitalization on the 10th of April, she was taking both the Glimepiride and Metformin, the latter stopped due to renal insufficiency and likely intravascular contrast used for her many studies during her hospitalization.  She is currently dealing with intermittent non compensated CHF. We have her to start Januvia 50 mg once daily in the morning with breakfast.    Current Meds  Medication Sig  . aspirin 81 MG chewable tablet Chew 1 tablet (81 mg total) by mouth daily.  Marland Kitchen. atorvastatin (LIPITOR) 80 MG tablet Take 1 tablet (80 mg total) by mouth daily.  . benzonatate (TESSALON) 100 MG capsule Take 1 capsule (100 mg total) by mouth 3 (three) times daily as needed for cough.  . carvedilol (COREG) 6.25 MG tablet Take 1 tablet (6.25 mg total) by mouth 2 (two) times daily with a meal.  . Ferrous Gluconate 324 (37.5 Fe) MG TABS 1 tab by mouth twice daily with half and orange  . glimepiride (AMARYL) 2 MG tablet Take 1 tablet (2 mg total) by mouth daily with  breakfast.  . isosorbide mononitrate (IMDUR) 30 MG 24 hr tablet Take 1 tablet (30 mg total) by mouth daily.  . pantoprazole (PROTONIX) 40 MG tablet Take 1 tablet (40 mg total) by mouth 2 (two) times daily.  . sertraline (ZOLOFT) 50 MG tablet 1/2 tab by mouth daily for 7 days, then 1 tab daily thereafter.    No Known Allergies      Review of Systems     Objective:   Physical Exam   Big smile for me when entering the room--have never seen this before. Lungs:  Bibasilar minimal crackles that resolve after several deep breaths.  Resonant to percussion over entire lung field bilaterally. CV:  RRR without murmur or rub.  Radial pulses normal and equal LE:  No edema today.        Assessment & Plan:  1.  Anxiety and likely depression:  Increase Sertraline to 50 mg once daily with followup in 1 month.  To call if any concern for side effects. Appears much improved today.  2.  CHF:  Appears compensated and reportedly off any furosemide recently.

## 2017-12-20 ENCOUNTER — Encounter: Payer: Self-pay | Admitting: Cardiovascular Disease

## 2017-12-20 ENCOUNTER — Ambulatory Visit (INDEPENDENT_AMBULATORY_CARE_PROVIDER_SITE_OTHER): Payer: Self-pay | Admitting: Cardiovascular Disease

## 2017-12-20 VITALS — BP 118/58 | Ht <= 58 in | Wt 110.0 lb

## 2017-12-20 DIAGNOSIS — Z8673 Personal history of transient ischemic attack (TIA), and cerebral infarction without residual deficits: Secondary | ICD-10-CM

## 2017-12-20 DIAGNOSIS — I25118 Atherosclerotic heart disease of native coronary artery with other forms of angina pectoris: Secondary | ICD-10-CM

## 2017-12-20 DIAGNOSIS — I213 ST elevation (STEMI) myocardial infarction of unspecified site: Secondary | ICD-10-CM

## 2017-12-20 DIAGNOSIS — I5042 Chronic combined systolic (congestive) and diastolic (congestive) heart failure: Secondary | ICD-10-CM

## 2017-12-20 DIAGNOSIS — N183 Chronic kidney disease, stage 3 unspecified: Secondary | ICD-10-CM

## 2017-12-20 DIAGNOSIS — I255 Ischemic cardiomyopathy: Secondary | ICD-10-CM

## 2017-12-20 NOTE — Progress Notes (Signed)
Cardiology Office Note    Date:  12/25/2017   ID:  Sheila White, [place] [birth year], MRN [id number]  PCP:  Mack Hook, MD  Cardiologist:  Shelva Majestic, MD   F/u office visit evaluation from me following her recent hospitalization and STEMI  History of Present Illness:  Sheila White is a 77 y.o. female who presented to [facility] on October 05, 2017 with an ST segment elevation anterior myocardial infarction with late presentation.  Her symptoms had developed the day prior to admission and became more progressive with reference to shortness of breath and continued chest pain.  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  She had 50% proximal OM1 stenosis followed by 95% distal marginal stenosis and there was mild irregularity of the RCA.  She underwent successful PCI to the LAD with ultimate insertion of a 2.5 x 26 mm Resolute DES stent postdilated 2.8 proximally and 2.68 distally with a 99% stenosis reduced to 0%.  The mid LAD stenosis was treated with DES stenting with a 2 to 5 x 12 mm Resolute stent.  The apical stenosis was treated with PTCA.  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  Her peak creatinine was 3.86 which improved to 1.88 at discharge.  He was ultimately discharged on October 24, 2017 and on Oct 28, 2017 so Sheila White for her initial office evaluation.  Patient has had issues with chronic cough.  Her pericardial symptoms had improved and she was treated with colchicine.    I saw her for initial post hospital follow-up evaluation on Nov 18, 2017.  At that time she denied any recurrent chest pain symptomatology.  She was tentatively scheduled to travel to [place] several days later and I recommended that she postpone this trip.  Since I saw her, she   developed some chest pain and was hospitalized on May 25 through Nov 22, 2017.  Troponins were normal.  Follow-up echo Doppler study showed an EF of 30 to 35%.  There was mid distal anterior, apical inferoapical severe hypokinesis secondary to her prior LAD territory infarct.  There was grade 1 diastolic dysfunction.  She also complained of some blurry vision with gait abnormality.  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  She was hyponatremic which revolved resolved.  She was seen by Sheila White post discharge on December 01, 2017.  At that time, her blood pressure was stable and pulse 77.  Follow-up laboratory was recommended.  Presently, she has felt well.  Her previous nausea has resolved.  She denies any further dizziness.  She presents for reevaluation.  Past Medical History:  Diagnosis Date  . Acute combined systolic and diastolic CHF, NYHA class 3 ([place]) 10/10/2017   in setting of MI  . HOH (hard of hearing)   . Ischemic cardiomyopathy 10/05/2017  . Non-insulin dependent type 2 diabetes mellitus ([place]) 2004  . 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 STEMI involving left anterior descending coronary artery ([place]) 10/05/2017    Past Surgical History:  Procedure Laterality Date  . CORONARY/GRAFT ACUTE MI REVASCULARIZATION N/A 10/05/2017   Procedure: Coronary/Graft Acute MI Revascularization;  Surgeon: Troy Sine, MD;  Location: [facility];  Service: Cardiovascular;  Laterality: N/A;  . LEFT HEART CATH AND CORONARY ANGIOGRAPHY N/A 10/05/2017   Procedure: LEFT HEART CATH AND CORONARY ANGIOGRAPHY;  Surgeon: Troy Sine, MD;  Location: [facility];  Service: Cardiovascular;  Laterality: N/A;    Current Medications: No facility-administered medications prior to visit.    Outpatient Medications Prior to Visit  Medication Sig Dispense Refill  . aspirin 81 MG chewable tablet Chew 1 tablet (81 mg total) by mouth daily. 60 tablet 2  . atorvastatin (LIPITOR) 80  MG tablet Take 1 tablet (80 mg total) by mouth daily. 30 tablet 11  . benzonatate (TESSALON) 100 MG capsule Take 1 capsule (100 mg total) by mouth 3 (three) times daily as needed for cough. 20 capsule 0  . carvedilol (COREG) 6.25 MG tablet Take 1 tablet (6.25 mg total) by mouth 2 (two) times daily with a meal. 180 tablet 3  . Ferrous Gluconate 324 (37.5 Fe) MG TABS 1 tab by mouth twice daily with half and orange    . glimepiride (AMARYL) 2 MG tablet Take 1 tablet (2 mg total) by mouth daily with breakfast. 30 tablet 1  . isosorbide mononitrate (IMDUR) 30 MG 24 hr tablet Take 1 tablet (30 mg total) by mouth daily. (Patient taking differently: Take 15 mg by mouth daily. ) 90 tablet 3  . loratadine (CLARITIN) 10 MG tablet Take 1 tablet (10 mg total) by mouth daily. 60 tablet 2  . nitroGLYCERIN (NITROSTAT) 0.4 MG SL tablet Place 1 tablet (0.4 mg total) under the tongue every 5 (five) minutes x 3 doses as needed for chest pain. 25 tablet 0  . pantoprazole (PROTONIX) 40 MG tablet Take 1 tablet (40 mg total) by mouth 2 (two) times daily. 60 tablet 11  . sertraline (ZOLOFT) 50 MG tablet 1/2 tab by mouth daily for 7 days, then 1 tab daily thereafter. (Patient taking differently: Take 50 mg by mouth at bedtime. ) 30 tablet 3  . sitaGLIPtin (JANUVIA) 100 MG tablet 1/2 tab by mouth once daily with morning meal. (Patient not taking: Reported on 12/23/2017) 30 tablet 11  . clopidogrel (PLAVIX) 75 MG tablet Take 75 mg by mouth daily.    . clopidogrel (PLAVIX) 75 MG tablet Take 1 tablet (75 mg total) by mouth daily. (Patient not taking: Reported on 12/19/2017) 30 tablet 11     Allergies:   Patient has no known allergies.   Social History   Socioeconomic History  . Marital status: Widowed    Spouse name: Not on file  . Number of children: 8  . Years of education: 70  . Highest education level: Not on file  Occupational History  . Not on file  Social Needs  . Financial resource strain: Not on file  . Food  insecurity:    Worry: Not on file    Inability: Not on file  . Transportation needs:    Medical: Not on file    Non-medical: Not on file  Tobacco Use  . Smoking status: Never Smoker  . Smokeless tobacco: Never Used  Substance and Sexual Activity  . Alcohol use: Never    Frequency: Never  . Drug use: Never  . Sexual activity: Not Currently  Lifestyle  . Physical activity:    Days per week: Not on file    Minutes per session: Not on file  . Stress: Not on file  Relationships  . Social connections:    Talks on phone: Not on file    Gets together: Not on file    Attends religious service: Not on file    Active member of club or organization: Not on  file    Attends meetings of clubs or organizations: Not on file    Relationship status: Not on file  Other Topics Concern  . Not on file  Social History Narrative   Living with daughter, Marisue Brooklyn and her husband and her 3 sons.     Family History:  The patient's family history includes Alcohol abuse in her father.   ROS General: Negative; No fevers, chills, or night sweats;  HEENT: Negative; No changes in vision or hearing, sinus congestion, difficulty swallowing Pulmonary: Negative; No cough, wheezing, shortness of breath, hemoptysis Cardiovascular: See HPI GI: Obvious nausea, resolved Musculoskeletal: Negative; no myalgias, joint pain, or weakness Hematologic/Oncology: Negative; no easy bruising, bleeding Endocrine: Negative; no heat/cold intolerance; no diabetes Neuro: Negative; no changes in balance, headaches; previous dizziness resolved Skin: Negative; No rashes or skin lesions Psychiatric: Negative; No behavioral problems, depression Sleep: Negative; No snoring, daytime sleepiness, hypersomnolence, bruxism, restless legs, hypnogognic hallucinations, no cataplexy Other comprehensive 14 point system review is negative.   PHYSICAL EXAM:   VS:  BP (!) 118/58   Ht '4\' 10"'  (1.473 m)   Wt 110 lb (49.9 kg)   BMI 22.99  kg/m      Repeat blood pressure by me was 110/70 without significant orthostatic change  Wt Readings from Last 3 Encounters:  12/25/17 108 lb 14.4 oz (49.4 kg)  12/20/17 110 lb (49.9 kg)  12/19/17 109 lb (49.4 kg)    General: Alert, oriented, no distress.  Skin: normal turgor, no rashes, warm and dry HEENT: Normocephalic, atraumatic. Pupils equal round and reactive to light; sclera anicteric; extraocular muscles intact; Fundi no hemorrhages or exudates. Nose without nasal septal hypertrophy Mouth/Parynx benign; Mallinpatti scale 3 Neck: No JVD, no carotid bruits; normal carotid upstroke Lungs: clear to ausculatation and percussion; no wheezing or rales Chest wall: without tenderness to palpitation Heart: PMI not displaced, RRR, s1 s2 normal, 1/6 systolic murmur, no diastolic murmur, no rubs, gallops, thrills, or heaves Abdomen: soft, nontender; no hepatosplenomehaly, BS+; abdominal aorta nontender and not dilated by palpation. Back: no CVA tenderness Pulses 2+ Musculoskeletal: full range of motion, normal strength, no joint deformities Extremities: no clubbing cyanosis or edema, Homan's sign negative  Neurologic: grossly nonfocal; Cranial nerves grossly wnl Psychologic: Normal mood and affect   Studies/Labs Reviewed:   EKG:  EKG is ordered today.  Normal sinus rhythm 70 bpm.  Old anterior MI with poor progression V1 through V5 with Q waves noted in 3 and aVF.  Normal intervals.  No ectopy.  02/18/2018 ECG (independently read by me): Sinus rhythm at 86 bpm.  Left anterior hemiblock.  QS complex V1 through V5 consistent with anterior infarct.  Inferior Q waves suggestive of inferior MI.  Normal intervals.  No ectopy.  Recent Labs: BMP Latest Ref Rng & Units 12/25/2017 12/24/2017 12/23/2017  Glucose 70 - 99 mg/dL 149(H) 152(H) 145(H)  BUN 8 - 23 mg/dL '21 19 18  ' Creatinine 0.44 - 1.00 mg/dL 1.72(H) 1.65(H) 1.57(H)  BUN/Creat Ratio 12 - 28 - - -  Sodium 135 - 145 mmol/L 127(L) 127(L)  129(L)  Potassium 3.5 - 5.1 mmol/L 4.0 4.1 4.3  Chloride 98 - 111 mmol/L 91(L) 94(L) 93(L)  CO2 22 - 32 mmol/L '27 23 26  ' Calcium 8.9 - 10.3 mg/dL 8.5(L) 8.4(L) 8.9     Hepatic Function Latest Ref Rng & Units 12/23/2017 11/19/2017 11/17/2017  Total Protein 6.5 - 8.1 g/dL 6.5 6.4(L) 7.0  Albumin 3.5 - 5.0 g/dL 3.1(L) 3.0(L) 4.0  AST  15 - 41 U/L 48(H) 19 15  ALT 0 - 44 U/L 47(H) 13(L) 12  Alk Phosphatase 38 - 126 U/L 101 59 72  Total Bilirubin 0.3 - 1.2 mg/dL 0.5 0.4 0.3  Bilirubin, Direct 0.1 - 0.5 mg/dL - <0.1(L) -    CBC Latest Ref Rng & Units 12/24/2017 12/23/2017 12/01/2017  WBC 4.0 - 10.5 K/uL 5.8 6.5 5.9  Hemoglobin 12.0 - 15.0 g/dL 8.6(L) 9.0(L) 9.8(L)  Hematocrit 36.0 - 46.0 % 26.9(L) 28.8(L) 28.8(L)  Platelets 150 - 400 K/uL 298 321 401   Lab Results  Component Value Date   MCV 80.1 12/24/2017   MCV 80.9 12/23/2017   MCV 80 12/01/2017   Lab Results  Component Value Date   TSH 2.617 11/20/2017   Lab Results  Component Value Date   HGBA1C 6.6 (H) 10/05/2017     BNP    Component Value Date/Time   BNP 2,803.0 (H) 12/23/2017 0455    ProBNP    Component Value Date/Time   PROBNP 20,354 (H) 12/01/2017 1041     Lipid Panel     Component Value Date/Time   CHOL 161 10/06/2017 0256   TRIG 110 10/06/2017 0256   HDL 49 10/06/2017 0256   CHOLHDL 3.3 10/06/2017 0256   VLDL 22 10/06/2017 0256   LDLCALC 90 10/06/2017 0256     RADIOLOGY: Ct Abdomen Pelvis Wo Contrast  Result Date: 12/23/2017 CLINICAL DATA:  Acute lower abdominal pain. EXAM: CT ABDOMEN AND PELVIS WITHOUT CONTRAST TECHNIQUE: Multidetector CT imaging of the abdomen and pelvis was performed following the standard protocol without IV contrast. COMPARISON:  CT scan of October 12, 2017. FINDINGS: Lower chest: Moderate bilateral pleural effusions are noted with adjacent subsegmental atelectasis or infiltrates. Hepatobiliary: No focal liver abnormality is seen. No gallstones, gallbladder wall thickening, or  biliary dilatation. Pancreas: Unremarkable. No pancreatic ductal dilatation or surrounding inflammatory changes. Spleen: Normal in size without focal abnormality. Adrenals/Urinary Tract: Adrenal glands appear normal. No hydronephrosis or renal obstruction is noted. No renal or ureteral calculi are noted. Mild urinary bladder distention is noted with thick wall, concerning for possible cystitis. Stomach/Bowel: The stomach appears normal. There is no evidence of bowel obstruction or inflammation. The appendix is not visualized. Vascular/Lymphatic: Aortic atherosclerosis. No enlarged abdominal or pelvic lymph nodes. Reproductive: Uterus and bilateral adnexa are unremarkable. Other: No abdominal wall hernia or abnormality. No abdominopelvic ascites. Musculoskeletal: No acute or significant osseous findings. IMPRESSION: Moderate bilateral pleural effusions are noted with adjacent subsegmental atelectasis or infiltrates. Mild urinary bladder distention is noted with uniformly thick bladder wall, concerning for possible cystitis. Aortic Atherosclerosis (ICD10-I70.0). Electronically Signed   By: Marijo Conception, M.D.   On: 12/23/2017 07:54   Dg Abdomen Acute W/chest  Result Date: 12/23/2017 CLINICAL DATA:  77 y/o  F; abdominal pain and vomiting for 1 day. EXAM: DG ABDOMEN ACUTE W/ 1V CHEST COMPARISON:  11/19/2017 chest radiograph. FINDINGS: Stable cardiomegaly given projection and technique. Aortic atherosclerosis with calcification. Bibasilar consolidations and pleural effusions. Interstitial pulmonary edema. Moderate dextrocurvature of the lumbar spine. Nonobstructive bowel gas pattern. No radiopaque urinary stone disease identified. IMPRESSION: 1. Nonobstructive bowel gas pattern. 2. Increase interstitial pulmonary edema and small bilateral effusions. 3. Stable cardiomegaly and aortic atherosclerosis. Electronically Signed   By: Kristine Garbe M.D.   On: 12/23/2017 06:19     Additional studies/ records  that were reviewed today include:  I reviewed her 3-week hospitalization records in detail.  I reviewed her follow-up office visit with Sheila White.  Laboratory catheterization studies, echo Doppler assessment were reviewed.  I reviewed her hospitalization, and follow-up office visit since my last evaluation.    ASSESSMENT:    1. ST elevation myocardial infarction (STEMI), subsequent episode of care ([place])   2. 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 Chronic combined systolic (congestive) and diastolic (congestive) heart failure ([place])   4. Ischemic cardiomyopathy   5. 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 CKD (chronic kidney disease) stage 3, GFR 30-59 ml/min [facility])     PLAN:   Ms. Matha Masse is a very pleasant 77 year old Hispanic female who presented with a late presentation anterior ST segment elevation MI.  She had an initial significant ischemic cardiomyopathy with an EF of 30% with grade 2 diastolic dysfunction and developed post infarct pericarditis treated with Decadron and colchicine and during her hospitalization and developed significant hyponatremia, acute kidney injury, with ultimate improvement.  Her creatinine has improved.   She has had issues with low blood pressure and is not on an ACE or ARB therapy due to previous renal insufficiency and low blood pressure.  Remotely, she had been started on hydralazine but this had to be discontinued due to low blood pressure.  Her most recent follow-up echo still shows an EF of 30 to 35% and is concordant with her late presentation anterior MI.  Ultimately, the plan will be to try to initiate low-dose Entresto but at present with her recent low blood pressure this cannot be done.  She is not having any recurrent anginal symptomatology on isosorbide 30 mg and carvedilol 6.25 mg twice a day.  She is now on aspirin and will be starting Plavix 75 mg  daily but has been on Brilinta due to her current and foreign status Plavix can only be used.   Plavix will be started once her current supply of Brilinta is exhausted.  Recent MRI scan has shown old cerebellar infarct.  She is diabetic on Januvia and glimepiride.  Her GERD is controlled with pantoprazole.  She denies any recent episodes of nausea.  Laboratory in April showed an LDL of 90.  We will require follow-up laboratory to make certain target LDL is less than 70 and if not Zetia 10 mg will be added to her medical regimen.  I will see her in 2 to 3 months for reevaluation.   Medication Adjustments/Labs and Tests Ordered: Current medicines are reviewed at length with the patient today.  Concerns regarding medicines are outlined above.  Medication changes, Labs and Tests ordered today are listed in the Patient Instructions below. Patient Instructions  Medication Instructions:  Your physician recommends that you continue on your current medications as directed. Please refer to the Current Medication list given to you today.  Follow-Up: 3 months with Dr. Claiborne Billings  Any Other Special Instructions Will Be Listed Below (If Applicable).     If you need a refill on your cardiac medications before your next appointment, please call your pharmacy.      Signed, Shelva Majestic, MD  12/25/2017 9:05 AM    [facility] [address] [place], [place], [place]  [address] Phone: [phone]

## 2017-12-20 NOTE — Patient Instructions (Signed)
Medication Instructions:  Your physician recommends that you continue on your current medications as directed. Please refer to the Current Medication list given to you today.  Follow-Up: 3 months with Dr. Kelly  Any Other Special Instructions Will Be Listed Below (If Applicable).     If you need a refill on your cardiac medications before your next appointment, please call your pharmacy.    

## 2017-12-21 ENCOUNTER — Other Ambulatory Visit (HOSPITAL_COMMUNITY): Payer: Self-pay

## 2017-12-23 ENCOUNTER — Emergency Department (HOSPITAL_COMMUNITY): Payer: Medicaid Other

## 2017-12-23 ENCOUNTER — Encounter (HOSPITAL_COMMUNITY): Payer: Self-pay

## 2017-12-23 ENCOUNTER — Observation Stay (HOSPITAL_COMMUNITY)
Admission: EM | Admit: 2017-12-23 | Discharge: 2017-12-25 | Disposition: A | Discharge status: Home / Self Care | Payer: Medicaid Other | Attending: Internal Medicine | Admitting: Internal Medicine

## 2017-12-23 ENCOUNTER — Other Ambulatory Visit: Payer: Self-pay

## 2017-12-23 DIAGNOSIS — I700 Atherosclerosis of aorta: Secondary | ICD-10-CM | POA: Insufficient documentation

## 2017-12-23 DIAGNOSIS — H9190 Unspecified hearing loss, unspecified ear: Secondary | ICD-10-CM | POA: Insufficient documentation

## 2017-12-23 DIAGNOSIS — I2510 Atherosclerotic heart disease of native coronary artery without angina pectoris: Secondary | ICD-10-CM | POA: Insufficient documentation

## 2017-12-23 DIAGNOSIS — R112 Nausea with vomiting, unspecified: Secondary | ICD-10-CM | POA: Diagnosis not present

## 2017-12-23 DIAGNOSIS — E119 Type 2 diabetes mellitus without complications: Secondary | ICD-10-CM | POA: Insufficient documentation

## 2017-12-23 DIAGNOSIS — F419 Anxiety disorder, unspecified: Secondary | ICD-10-CM | POA: Insufficient documentation

## 2017-12-23 DIAGNOSIS — Z8679 Personal history of other diseases of the circulatory system: Secondary | ICD-10-CM

## 2017-12-23 DIAGNOSIS — R109 Unspecified abdominal pain: Secondary | ICD-10-CM | POA: Diagnosis present

## 2017-12-23 DIAGNOSIS — I252 Old myocardial infarction: Secondary | ICD-10-CM | POA: Diagnosis not present

## 2017-12-23 DIAGNOSIS — Z87448 Personal history of other diseases of urinary system: Secondary | ICD-10-CM

## 2017-12-23 DIAGNOSIS — I5043 Acute on chronic combined systolic (congestive) and diastolic (congestive) heart failure: Secondary | ICD-10-CM | POA: Insufficient documentation

## 2017-12-23 DIAGNOSIS — N3289 Other specified disorders of bladder: Secondary | ICD-10-CM | POA: Insufficient documentation

## 2017-12-23 DIAGNOSIS — Z7984 Long term (current) use of oral hypoglycemic drugs: Secondary | ICD-10-CM | POA: Diagnosis not present

## 2017-12-23 DIAGNOSIS — D649 Anemia, unspecified: Secondary | ICD-10-CM | POA: Insufficient documentation

## 2017-12-23 DIAGNOSIS — I255 Ischemic cardiomyopathy: Secondary | ICD-10-CM | POA: Insufficient documentation

## 2017-12-23 DIAGNOSIS — J90 Pleural effusion, not elsewhere classified: Secondary | ICD-10-CM | POA: Diagnosis not present

## 2017-12-23 DIAGNOSIS — F329 Major depressive disorder, single episode, unspecified: Secondary | ICD-10-CM | POA: Diagnosis not present

## 2017-12-23 DIAGNOSIS — E871 Hypo-osmolality and hyponatremia: Secondary | ICD-10-CM | POA: Diagnosis not present

## 2017-12-23 DIAGNOSIS — I5023 Acute on chronic systolic (congestive) heart failure: Secondary | ICD-10-CM

## 2017-12-23 DIAGNOSIS — Z79899 Other long term (current) drug therapy: Secondary | ICD-10-CM | POA: Insufficient documentation

## 2017-12-23 DIAGNOSIS — I509 Heart failure, unspecified: Secondary | ICD-10-CM

## 2017-12-23 DIAGNOSIS — I517 Cardiomegaly: Secondary | ICD-10-CM | POA: Diagnosis not present

## 2017-12-23 DIAGNOSIS — Z955 Presence of coronary angioplasty implant and graft: Secondary | ICD-10-CM | POA: Diagnosis not present

## 2017-12-23 DIAGNOSIS — Z7982 Long term (current) use of aspirin: Secondary | ICD-10-CM | POA: Diagnosis not present

## 2017-12-23 HISTORY — DX: Heart failure, unspecified: I50.9

## 2017-12-23 LAB — URINALYSIS, ROUTINE W REFLEX MICROSCOPIC
Bilirubin Urine: NEGATIVE
GLUCOSE, UA: NEGATIVE mg/dL
HGB URINE DIPSTICK: NEGATIVE
Ketones, ur: NEGATIVE mg/dL
NITRITE: NEGATIVE
PROTEIN: NEGATIVE mg/dL
Specific Gravity, Urine: 1.01 (ref 1.005–1.030)
pH: 6 (ref 5.0–8.0)

## 2017-12-23 LAB — BASIC METABOLIC PANEL
Anion gap: 10 (ref 5–15)
BUN: 18 mg/dL (ref 8–23)
CALCIUM: 8.9 mg/dL (ref 8.9–10.3)
CO2: 26 mmol/L (ref 22–32)
CREATININE: 1.57 mg/dL — AB (ref 0.44–1.00)
Chloride: 93 mmol/L — ABNORMAL LOW (ref 98–111)
GFR calc Af Amer: 35 mL/min — ABNORMAL LOW (ref >60)
GFR calc non Af Amer: 30 mL/min — ABNORMAL LOW (ref >60)
Glucose, Bld: 145 mg/dL — ABNORMAL HIGH (ref 70–99)
Potassium: 4.3 mmol/L (ref 3.5–5.1)
SODIUM: 129 mmol/L — AB (ref 135–145)

## 2017-12-23 LAB — CBC WITH DIFFERENTIAL/PLATELET
BASOS PCT: 1 %
Basophils Absolute: 0.1 10*3/uL (ref 0.0–0.1)
EOS ABS: 0.1 10*3/uL (ref 0.0–0.7)
Eosinophils Relative: 1 %
HCT: 28.8 % — ABNORMAL LOW (ref 36.0–46.0)
Hemoglobin: 9 g/dL — ABNORMAL LOW (ref 12.0–15.0)
Lymphocytes Relative: 16 %
Lymphs Abs: 1 10*3/uL (ref 0.7–4.0)
MCH: 25.3 pg — AB (ref 26.0–34.0)
MCHC: 31.3 g/dL (ref 30.0–36.0)
MCV: 80.9 fL (ref 78.0–100.0)
MONO ABS: 0.2 10*3/uL (ref 0.1–1.0)
Monocytes Relative: 3 %
NEUTROS ABS: 5.1 10*3/uL (ref 1.7–7.7)
NEUTROS PCT: 79 %
PLATELETS: 321 10*3/uL (ref 150–400)
RBC: 3.56 MIL/uL — ABNORMAL LOW (ref 3.87–5.11)
RDW: 14.6 % (ref 11.5–15.5)
WBC: 6.5 10*3/uL (ref 4.0–10.5)

## 2017-12-23 LAB — COMPREHENSIVE METABOLIC PANEL
ALT: 47 U/L — ABNORMAL HIGH (ref 0–44)
ANION GAP: 12 (ref 5–15)
AST: 48 U/L — ABNORMAL HIGH (ref 15–41)
Albumin: 3.1 g/dL — ABNORMAL LOW (ref 3.5–5.0)
Alkaline Phosphatase: 101 U/L (ref 38–126)
BUN: 17 mg/dL (ref 8–23)
CALCIUM: 8.7 mg/dL — AB (ref 8.9–10.3)
CHLORIDE: 91 mmol/L — AB (ref 98–111)
CO2: 22 mmol/L (ref 22–32)
Creatinine, Ser: 1.36 mg/dL — ABNORMAL HIGH (ref 0.44–1.00)
GFR calc non Af Amer: 36 mL/min — ABNORMAL LOW (ref >60)
GFR, EST AFRICAN AMERICAN: 42 mL/min — AB (ref >60)
Glucose, Bld: 201 mg/dL — ABNORMAL HIGH (ref 70–99)
Potassium: 4.9 mmol/L (ref 3.5–5.1)
Sodium: 125 mmol/L — ABNORMAL LOW (ref 135–145)
Total Bilirubin: 0.5 mg/dL (ref 0.3–1.2)
Total Protein: 6.5 g/dL (ref 6.5–8.1)

## 2017-12-23 LAB — BRAIN NATRIURETIC PEPTIDE: B Natriuretic Peptide: 2803 pg/mL — ABNORMAL HIGH (ref 0.0–100.0)

## 2017-12-23 LAB — I-STAT CG4 LACTIC ACID, ED
Lactic Acid, Venous: 1.12 mmol/L (ref 0.5–1.9)
Lactic Acid, Venous: 1.01 mmol/L (ref 0.5–1.9)

## 2017-12-23 LAB — OSMOLALITY: OSMOLALITY: 273 mosm/kg — AB (ref 275–295)

## 2017-12-23 LAB — LIPASE, BLOOD: Lipase: 36 U/L (ref 11–51)

## 2017-12-23 LAB — OSMOLALITY, URINE: Osmolality, Ur: 328 mOsm/kg (ref 300–900)

## 2017-12-23 LAB — TROPONIN I
Troponin I: 0.04 ng/mL (ref <0.03)
Troponin I: 0.04 ng/mL (ref <0.03)

## 2017-12-23 LAB — GLUCOSE, CAPILLARY: GLUCOSE-CAPILLARY: 153 mg/dL — AB (ref 70–99)

## 2017-12-23 MED ORDER — GLIMEPIRIDE 4 MG PO TABS: 2.0000 mg | ORAL_TABLET | Freq: Every day | ORAL | Status: DC

## 2017-12-23 MED ORDER — FUROSEMIDE 10 MG/ML IJ SOLN
40.0000 mg | Freq: Once | INTRAMUSCULAR | Status: AC
  Administered 2017-12-23: 40 mg via INTRAVENOUS

## 2017-12-23 MED ORDER — FUROSEMIDE 10 MG/ML IJ SOLN
INTRAMUSCULAR | Status: AC
  Filled 2017-12-23: qty 4

## 2017-12-23 MED ORDER — FUROSEMIDE 10 MG/ML IJ SOLN
20.0000 mg | Freq: Once | INTRAMUSCULAR | Status: AC
  Administered 2017-12-23: 20 mg via INTRAVENOUS
  Filled 2017-12-23: qty 2

## 2017-12-23 MED ORDER — ACETAMINOPHEN 650 MG RE SUPP: 650.0000 mg | Freq: Four times a day (QID) | RECTAL | Status: DC | PRN

## 2017-12-23 MED ORDER — TICAGRELOR 90 MG PO TABS
90.0000 mg | ORAL_TABLET | Freq: Two times a day (BID) | ORAL | Status: DC
  Administered 2017-12-23 – 2017-12-25 (×4): 90 mg via ORAL
  Filled 2017-12-23 (×4): qty 1

## 2017-12-23 MED ORDER — GLIMEPIRIDE 4 MG PO TABS
2.0000 mg | ORAL_TABLET | Freq: Every day | ORAL | Status: DC
  Administered 2017-12-24 – 2017-12-25 (×2): 2 mg via ORAL
  Filled 2017-12-23 (×2): qty 1

## 2017-12-23 MED ORDER — CARVEDILOL 6.25 MG PO TABS
6.2500 mg | ORAL_TABLET | Freq: Two times a day (BID) | ORAL | Status: DC
  Administered 2017-12-24 – 2017-12-25 (×4): 6.25 mg via ORAL
  Filled 2017-12-23 (×4): qty 1

## 2017-12-23 MED ORDER — IOPAMIDOL (ISOVUE-300) INJECTION 61%
INTRAVENOUS | Status: AC
  Filled 2017-12-23: qty 30

## 2017-12-23 MED ORDER — ATORVASTATIN CALCIUM 80 MG PO TABS
80.0000 mg | ORAL_TABLET | Freq: Every day | ORAL | Status: DC
  Administered 2017-12-23 – 2017-12-25 (×3): 80 mg via ORAL
  Filled 2017-12-23 (×3): qty 1

## 2017-12-23 MED ORDER — ACETAMINOPHEN 325 MG PO TABS: 650.0000 mg | ORAL_TABLET | Freq: Four times a day (QID) | ORAL | Status: DC | PRN

## 2017-12-23 MED ORDER — SERTRALINE HCL 50 MG PO TABS
50.0000 mg | ORAL_TABLET | Freq: Every day | ORAL | Status: DC
  Administered 2017-12-23 – 2017-12-24 (×2): 50 mg via ORAL
  Filled 2017-12-23 (×2): qty 1

## 2017-12-23 MED ORDER — ISOSORBIDE MONONITRATE ER 30 MG PO TB24
15.0000 mg | ORAL_TABLET | Freq: Every day | ORAL | Status: DC
  Administered 2017-12-24 – 2017-12-25 (×2): 15 mg via ORAL
  Filled 2017-12-23 (×2): qty 1

## 2017-12-23 MED ORDER — PANTOPRAZOLE SODIUM 40 MG PO TBEC
40.0000 mg | DELAYED_RELEASE_TABLET | Freq: Two times a day (BID) | ORAL | Status: DC
  Administered 2017-12-23 – 2017-12-25 (×4): 40 mg via ORAL
  Filled 2017-12-23 (×5): qty 1

## 2017-12-23 MED ORDER — ONDANSETRON HCL 4 MG/2ML IJ SOLN
4.0000 mg | Freq: Once | INTRAMUSCULAR | Status: AC
  Administered 2017-12-23: 4 mg via INTRAVENOUS
  Filled 2017-12-23: qty 2

## 2017-12-23 MED ORDER — ONDANSETRON HCL 4 MG/2ML IJ SOLN: 4.0000 mg | Freq: Four times a day (QID) | INTRAMUSCULAR | Status: DC | PRN

## 2017-12-23 MED ORDER — LORATADINE 10 MG PO TABS
10.0000 mg | ORAL_TABLET | Freq: Every day | ORAL | Status: DC
  Administered 2017-12-24 – 2017-12-25 (×2): 10 mg via ORAL
  Filled 2017-12-23 (×2): qty 1

## 2017-12-23 MED ORDER — ENOXAPARIN SODIUM 30 MG/0.3ML ~~LOC~~ SOLN
30.0000 mg | SUBCUTANEOUS | Status: DC
  Administered 2017-12-23 – 2017-12-25 (×3): 30 mg via SUBCUTANEOUS
  Filled 2017-12-23 (×3): qty 0.3

## 2017-12-23 MED ORDER — ASPIRIN 81 MG PO CHEW
81.0000 mg | CHEWABLE_TABLET | Freq: Every day | ORAL | Status: DC
  Administered 2017-12-23 – 2017-12-25 (×3): 81 mg via ORAL
  Filled 2017-12-23 (×3): qty 1

## 2017-12-23 MED ORDER — CARVEDILOL 6.25 MG PO TABS
6.2500 mg | ORAL_TABLET | Freq: Two times a day (BID) | ORAL | Status: DC
  Administered 2017-12-23: 6.25 mg via ORAL
  Filled 2017-12-23: qty 1

## 2017-12-23 MED ORDER — ONDANSETRON HCL 4 MG PO TABS: 4.0000 mg | ORAL_TABLET | Freq: Four times a day (QID) | ORAL | Status: DC | PRN

## 2017-12-23 NOTE — ED Triage Notes (Addendum)
Per PTAR, pt arrives from home with complaints of lower abdominal pain x1 day rated 10/10. PTAR stated daughter said pt had BM 1 hour ago that was darker than normal. PTAR reports pt started dry heaving/vomitting en route and was clear. Pt is spanish speaking. Pt reports hx of similar episode.

## 2017-12-23 NOTE — ED Notes (Signed)
Attempted report x1. 

## 2017-12-23 NOTE — ED Provider Notes (Signed)
  Physical Exam  BP 132/84   Pulse 84   Temp 97.6 F (36.4 C) (Oral)   Resp 18   SpO2 93%   Physical Exam  ED Course/Procedures     Procedures  MDM  Care assumed at 7 am. Patient here with SOB, leg swelling, epigastric pain. Was noted to be fluid overloaded and CXR showed pulmonary edema. Also hyponatremic to 125. Sign out pending CT ab/pel, IV lasix, admission.   9:51 AM CT showed bilateral pleural effusions. Trop 0.04, similar to before. UA showed no UTI. Given lasix. Will admit for hyponatremia likely from CHF.   CRITICAL CARE Performed by: Richardean Canalavid H Yvonda Fouty   Total critical care time: 30 minutes  Critical care time was exclusive of separately billable procedures and treating other patients.  Critical care was necessary to treat or prevent imminent or life-threatening deterioration.  Critical care was time spent personally by me on the following activities: development of treatment plan with patient and/or surrogate as well as nursing, discussions with consultants, evaluation of patient's response to treatment, examination of patient, obtaining history from patient or surrogate, ordering and performing treatments and interventions, ordering and review of laboratory studies, ordering and review of radiographic studies, pulse oximetry and re-evaluation of patient's condition.    Charlynne PanderYao, Tayvin Preslar Hsienta, MD 12/23/17 [phone]

## 2017-12-23 NOTE — ED Notes (Signed)
Pt sleeping- unable to urinate. Family member in room and said they will call when she needs to urinate.

## 2017-12-23 NOTE — ED Provider Notes (Signed)
[facility] EMERGENCY DEPARTMENT Provider Note   CSN: [id number] Arrival date & time: 12/23/17  0435     History   Chief Complaint Chief Complaint  Patient presents with  . Abdominal Pain    HPI Sheila White is a 77 y.o. female.  Level 5 caveat for language barrier.  Patient having trouble answering questions even with Engineer, structural.  Daughter at bedside to speak some [place].  Patient apparently developed upper abdominal pain with nausea and vomiting yesterday which subsided.  The upper abdominal pain and vomiting occurred around 2 AM.  She had multiple episodes of dry heaving and vomiting since.  Emesis has been clear.  Did have one dark stool but no diarrhea.  No fever.  No recent antibiotic use or travel.  No sick contacts. Patient with history of ischemic cardiomyopathy, diabetes, postinfarction pericarditis, CHF.  Denies any chest pain or shortness of breath today.   She still has a gallbladder and appendix.  The history is provided by the patient, the EMS personnel and a relative. The history is limited by a language barrier. A language interpreter was used.    Past Medical History:  Diagnosis Date  . Acute combined systolic and diastolic CHF, NYHA class 3 (HCC) 10/10/2017   in setting of MI  . HOH (hard of hearing)   . Ischemic cardiomyopathy 10/05/2017  . Non-insulin dependent type 2 diabetes mellitus (HCC) 2004  . 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 STEMI involving left anterior descending coronary artery (HCC) 10/05/2017    Patient Active Problem List   Diagnosis Date Noted  . Anxiety and depression 12/19/2017  . Chest pain, rule out acute myocardial infarction 11/19/2017  . Epigastric abdominal pain   . Renal insufficiency 10/31/2017  . Normocytic anemia 10/31/2017  . Coronary artery disease 10/31/2017  . Chest pain 10/31/2017  . Acute lower UTI 10/31/2017  . Acute cystitis without hematuria   . Cough   .  Hyponatremia   . Acute on chronic systolic congestive heart failure (HCC) 10/20/2017  . Abdominal pain   . Acute combined systolic and diastolic CHF, NYHA class 3 (HCC) 10/10/2017  . MI, acute, non ST segment elevation (HCC)   . Ischemic cardiomyopathy   . STEMI (ST elevation myocardial infarction) (HCC) 10/05/2017  . Dyspnea 10/05/2017  . HOH (hard of hearing) 10/05/2017  . STEMI involving left anterior descending coronary artery (HCC) 10/05/2017  . Non-insulin dependent type 2 diabetes mellitus (HCC) 06/28/2002    Past Surgical History:  Procedure Laterality Date  . CORONARY/GRAFT ACUTE MI REVASCULARIZATION N/A 10/05/2017   Procedure: Coronary/Graft Acute MI Revascularization;  Surgeon: Lennette Bihari, MD;  Location: [facility] INVASIVE CV LAB;  Service: Cardiovascular;  Laterality: N/A;  . LEFT HEART CATH AND CORONARY ANGIOGRAPHY N/A 10/05/2017   Procedure: LEFT HEART CATH AND CORONARY ANGIOGRAPHY;  Surgeon: Lennette Bihari, MD;  Location: [facility] INVASIVE CV LAB;  Service: Cardiovascular;  Laterality: N/A;     OB History   None      Home Medications    Prior to Admission medications   Medication Sig Start Date End Date Taking? Authorizing Provider  aspirin 81 MG chewable tablet Chew 1 tablet (81 mg total) by mouth daily. 10/25/17   Filbert Schilder, NP  atorvastatin (LIPITOR) 80 MG tablet Take 1 tablet (80 mg total) by mouth daily. 11/22/17   Julieanne Manson, MD  benzonatate (TESSALON) 100 MG capsule Take 1 capsule (100 mg total) by mouth 3 (three) times  daily as needed for cough. 11/22/17   Zigmund DanielPowell, A Caldwell Jr., MD  carvedilol (COREG) 6.25 MG tablet Take 1 tablet (6.25 mg total) by mouth 2 (two) times daily with a meal. 12/01/17   Barrett, Joline Salthonda G, PA-C  clopidogrel (PLAVIX) 75 MG tablet Take 75 mg by mouth daily.    [provider]  Ferrous Gluconate 324 (37.5 Fe) MG TABS 1 tab by mouth twice daily with half and orange 11/25/17   Julieanne MansonMulberry, Elizabeth, MD  glimepiride (AMARYL) 2  MG tablet Take 1 tablet (2 mg total) by mouth daily with breakfast. 10/25/17   Lennette BihariKelly, Thomas A, MD  isosorbide mononitrate (IMDUR) 30 MG 24 hr tablet Take 1 tablet (30 mg total) by mouth daily. 12/01/17   Barrett, Joline Salthonda G, PA-C  loratadine (CLARITIN) 10 MG tablet Take 1 tablet (10 mg total) by mouth daily. 10/25/17   Filbert SchilderMcDaniel, Jill D, NP  nitroGLYCERIN (NITROSTAT) 0.4 MG SL tablet Place 1 tablet (0.4 mg total) under the tongue every 5 (five) minutes x 3 doses as needed for chest pain. 10/24/17   Filbert SchilderMcDaniel, Jill D, NP  pantoprazole (PROTONIX) 40 MG tablet Take 1 tablet (40 mg total) by mouth 2 (two) times daily. 11/22/17   Julieanne MansonMulberry, Elizabeth, MD  sertraline (ZOLOFT) 50 MG tablet 1/2 tab by mouth daily for 7 days, then 1 tab daily thereafter. 12/05/17   Julieanne MansonMulberry, Elizabeth, MD  sitaGLIPtin (JANUVIA) 100 MG tablet 1/2 tab by mouth once daily with morning meal. 12/13/17   Julieanne MansonMulberry, Elizabeth, MD    Family History Family History  Problem Relation Age of Onset  . Alcohol abuse Father     Social History Social History   Tobacco Use  . Smoking status: Never Smoker  . Smokeless tobacco: Never Used  Substance Use Topics  . Alcohol use: Never    Frequency: Never  . Drug use: Never     Allergies   Patient has no known allergies.   Review of Systems Review of Systems  Constitutional: Positive for activity change and appetite change. Negative for fever.  HENT: Negative for congestion, nosebleeds and postnasal drip.   Eyes: Negative for visual disturbance.  Respiratory: Negative for cough, chest tightness and shortness of breath.   Cardiovascular: Negative for chest pain.  Gastrointestinal: Positive for abdominal pain, nausea and vomiting.  Genitourinary: Negative for dysuria and hematuria.  Musculoskeletal: Negative for arthralgias, joint swelling and myalgias.  Neurological: Negative for dizziness, weakness and headaches.    all other systems are negative except as noted in the HPI and PMH.      Physical Exam Updated Vital Signs BP (!) 175/93   Pulse 77   Temp 97.6 F (36.4 C) (Oral)   Resp (!) 21   SpO2 96%   Physical Exam  Constitutional: She is oriented to person, place, and time. She appears well-developed and well-nourished. No distress.  Dry heaving  HENT:  Head: Normocephalic and atraumatic.  Mouth/Throat: Oropharynx is clear and moist. No oropharyngeal exudate.  Eyes: Pupils are equal, round, and reactive to light. Conjunctivae and EOM are normal.  Neck: Normal range of motion. Neck supple.  No meningismus.  Cardiovascular: Normal rate, regular rhythm, normal heart sounds and intact distal pulses.  No murmur heard. Pulmonary/Chest: Effort normal and breath sounds normal. No respiratory distress.  Abdominal: Soft. There is tenderness. There is guarding. There is no rebound.  TTP RUQ and epigastrium with voluntary guarding  Musculoskeletal: Normal range of motion. She exhibits no edema or tenderness.  Neurological: She  is alert and oriented to person, place, and time. No cranial nerve deficit. She exhibits normal muscle tone. Coordination normal.   5/5 strength throughout. CN 2-12 intact.Equal grip strength.   Skin: Skin is warm.  Psychiatric: She has a normal mood and affect. Her behavior is normal.  Nursing note and vitals reviewed.    ED Treatments / Results  Labs (all labs ordered are listed, but only abnormal results are displayed) Labs Reviewed  CBC WITH DIFFERENTIAL/PLATELET - Abnormal; Notable for the following components:      Result Value   RBC 3.56 (*)    Hemoglobin 9.0 (*)    HCT 28.8 (*)    MCH 25.3 (*)    All other components within normal limits  COMPREHENSIVE METABOLIC PANEL - Abnormal; Notable for the following components:   Sodium 125 (*)    Chloride 91 (*)    Glucose, Bld 201 (*)    Creatinine, Ser 1.36 (*)    Calcium 8.7 (*)    Albumin 3.1 (*)    AST 48 (*)    ALT 47 (*)    GFR calc non Af Amer 36 (*)    GFR calc Af Amer 42  (*)    All other components within normal limits  TROPONIN I - Abnormal; Notable for the following components:   Troponin I 0.04 (*)    All other components within normal limits  URINE CULTURE  LIPASE, BLOOD  URINALYSIS, ROUTINE W REFLEX MICROSCOPIC  BRAIN NATRIURETIC PEPTIDE  OSMOLALITY  OSMOLALITY, URINE  I-STAT CG4 LACTIC ACID, ED  I-STAT CG4 LACTIC ACID, ED    EKG EKG Interpretation  Date/Time:  Friday December 23 2017 05:37:29 EDT Ventricular Rate:  83 PR Interval:    QRS Duration: 85 QT Interval:  381 QTC Calculation: 448 R Axis:   155 Text Interpretation:  Sinus rhythm Probable left atrial enlargement Anterolateral infarct, old similar to earlier today Confirmed by Glynn Octave [phone]) on 12/23/2017 5:40:36 AM   Radiology No results found.  Procedures Procedures (including critical care time)  Medications Ordered in ED Medications  ondansetron (ZOFRAN) injection 4 mg (has no administration in time range)     Initial Impression / Assessment and Plan / ED Course  I have reviewed the triage vital signs and the nursing notes.  Pertinent labs & imaging results that were available during my care of the patient were reviewed by me and considered in my medical decision making (see chart for details).    Upper abdominal pain with nausea and vomiting. Hx ischemic cardiomyopathy. Denies CP similar to MI.   EKG with old/ evolving infarct.  AAS negative. Labs reassuring with normal LFTs, lipase. Stable Cr.  Does have hyponatremia and edema on Xray.  Suspect hypoNa from volume overload and will gently diuresis.  CT pending to further evaluate abdominal pain.   Dr. Silverio Lay to assume care at shift change. Anticipate admission for hyponatremia and CHF exacerbation.  Final Clinical Impressions(s) / ED Diagnoses   Final diagnoses:  None    ED Discharge Orders    None       Veda Arrellano, Jeannett Senior, MD 12/23/17 [phone]

## 2017-12-23 NOTE — H&P (Addendum)
Date: 12/23/2017               Patient Name:  Sheila White MRN: [id number]  DOB: 03/16/1941 Age / Sex: 77 y.o., female   PCP: Julieanne Manson, MD         Medical Service: Internal Medicine Teaching Service         Attending Physician: Dr. Silverio Lay, Gonzella Lex, MD    First Contact: Dr. Alinda Money Pager: [phone]  Second Contact: Dr. Vincente Liberty Pager: [phone]       After Hours (After 5p/  First Contact Pager: [phone]  weekends / holidays): Second Contact Pager: [phone]   Chief Complaint: Abdominal pain, nausea, vomiting   History of Present Illness: Patient is a 77 yo F with pmhx of CAD s/p PCI,  ischemic cardiomyopathy (30-35%), and type II DM presenting after an episode of abdominal pain, nausea, and one episode of emesis yesterday. Patient was admitted recently in April of 2019 with a STEMI. She underwent LHC and found to have 90% stenosis of her LAD. She underwent PCI with DES. Her course was complicated by postinfarction pericarditis, AKI, hyponatremia, abdominal pain, weakness, and persistent cough. Her renal function improved (Scr peaked 2.86, improved 1.88 on discharge) and her pericarditis was treated with decadron and colchicine. Since then, she has been admitted twice for acute decompensated heart failure, once with similar symptoms of abdominal pain and the second episode with chest pain. She follows with Dr. Tresa Endo (cardiology) and was last seen in his clinic on 12/20/17. At that time she was doing well with medical management. She had a repeat echocardiogram two days ago that demonstrated mild LVH, EF 30-35%, severe apical inferoapical hypokinesis, and G1DD unchanged from prior echo two months ago. History was limited by language barrier and obtained mostly from chart review and the patient's daughter. Per daughter, patient denies chest pain, heart palpitations, SOB, increased swelling, increased fluid intake, dietary indiscretion, and recent medications changes. Patient's  daughter denies that she is on a "fluid pill" at home, and there is not a diuretic listed on her med list nor in her bag of medications, however per Dr. Landry Dyke last progress note she is suppose to be on lasix 40 mg BID. She is also suppose to have started hydralazine 12.5 mg BID, and tradjenta for her diabetes. Again these were not with her medications.   On arrival to the ED, patient was afebrile T 97.68F and hypertensive with BP 165/97, HR 81, RR 14, and oxygen 97% on RA. Labs were significant for a BMP with sodium of 125. Urine osm was normal 328,  Renal function was stable with Scr 1.36. CBC with chronic stable normocytic anemia. Lipase 36. BNP was elevated 2,800. Troponin was 0.04. Lactic acid 1.01. CXR with interstitial edema and small bilateral effusions. CT abdomen/pelvis was concerning for possible cystitis, however UA was unremarkable. EKG was sinus rhythm with Q waves in leads V2-V4.  Meds:  Current Meds  Medication Sig  . aspirin 81 MG chewable tablet Chew 1 tablet (81 mg total) by mouth daily.  Marland Kitchen atorvastatin (LIPITOR) 80 MG tablet Take 1 tablet (80 mg total) by mouth daily.  . benzonatate (TESSALON) 100 MG capsule Take 1 capsule (100 mg total) by mouth 3 (three) times daily as needed for cough.  . carvedilol (COREG) 6.25 MG tablet Take 1 tablet (6.25 mg total) by mouth 2 (two) times daily with a meal.  . Ferrous Gluconate 324 (37.5 Fe) MG TABS 1 tab by  mouth twice daily with half and orange  . glimepiride (AMARYL) 2 MG tablet Take 1 tablet (2 mg total) by mouth daily with breakfast.  . isosorbide mononitrate (IMDUR) 30 MG 24 hr tablet Take 1 tablet (30 mg total) by mouth daily. (Patient taking differently: Take 15 mg by mouth daily. )  . loratadine (CLARITIN) 10 MG tablet Take 1 tablet (10 mg total) by mouth daily.  . nitroGLYCERIN (NITROSTAT) 0.4 MG SL tablet Place 1 tablet (0.4 mg total) under the tongue every 5 (five) minutes x 3 doses as needed for chest pain.  . pantoprazole  (PROTONIX) 40 MG tablet Take 1 tablet (40 mg total) by mouth 2 (two) times daily.  . sertraline (ZOLOFT) 50 MG tablet 1/2 tab by mouth daily for 7 days, then 1 tab daily thereafter. (Patient taking differently: Take 50 mg by mouth at bedtime. )  . ticagrelor (BRILINTA) 90 MG TABS tablet Take 90 mg by mouth 2 (two) times daily.     Allergies: Allergies as of 12/23/2017  . (No Known Allergies)   Past Medical History:  Diagnosis Date  . Acute combined systolic and diastolic CHF, NYHA class 3 (HCC) 10/10/2017   in setting of MI  . HOH (hard of hearing)   . Ischemic cardiomyopathy 10/05/2017  . Non-insulin dependent type 2 diabetes mellitus (HCC) 2004  . 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 STEMI involving left anterior descending coronary artery (HCC) 10/05/2017    Family History:  Family History  Problem Relation Age of Onset  . Alcohol abuse Father    Social History: Denies tobacco and alcohol use.   Review of Systems: A complete ROS was negative except as per HPI.   Physical Exam: Blood pressure 132/84, pulse 84, temperature 97.6 F (36.4 C), temperature source Oral, resp. rate 18, SpO2 93 %. Constitutional: NAD, appears comfortable HEENT: Atraumatic, normocephalic. PERRL, anicteric sclera.  Neck: Supple, trachea midline.  Cardiovascular: RRR, no murmurs, rubs, or gallops.  Pulmonary/Chest: Bilateral crackles to mid lung fields  Abdominal: Soft, non tender, non distended. +BS.  Extremities: Warm and well perfused. No edema.  Neurological: A&Ox3, CN II - XII grossly intact.  Skin: No rashes or erythema  Psychiatric: Normal mood and affect   EKG: personally reviewed my interpretation is: NSR with Q waves in V2-V4. Left atrial enlargement.   CXR: personally reviewed my interpretation is: Bilateral pleural effusions, increased interstitial markings bilaterally   Assessment & Plan by Problem:  Patient is a pleasant spanish speaking 77 yo presenting today with  complaint of abdominal pain, nausea, and one episode of emesis. She was found to be hypervolemic on exam in acute systolic heart failure. Admitted for IV diuresis.   Acute Decompensated HFrEF: Likely due to discrepancy or confusion over medications. Per Dr. Landry Dyke note she should be on lasix 40 mg BID which she has not been taking. Trop was mildly elevated, but EKG is with out acute ischemic changes. She is s/p IV lasix 20 mg in ED. Symptoms have already improved (likely gut edema) however she has persistent crackles on exam to her mid lung fields bilaterally.  -- Will give an additional IV lasix 40 mg -- Strict I/Os, daily weights (dry weight of 110) -- Repeat troponin -- AM EKG -- Tele monitoring  Hyponatremia: Acute on chronic. Hypervolemic in the setting of heart failure exacerbation. Urine osm is normal (328), add on urine sodium is pending. Serum osm was not collected prior to lasix. -- Repeat BMP @ 3am  -- Fluid  restrict, diuresis above   CAD s/p DES to LAD Ischemic cardiomyopathy  -- Continue ASA & Brilinta  -- Continue home atorvastatin 80 mg -- Continue home coreg 6.25 BID -- Continue home Imdur 15 mg QD -- Monitor BP, will hold off on starting / restarting hydralazine 12.5 mg BID for now  Type II DM:  -- Continue home glimepiride  -- Hold Tradjenta for now, follow up PCP -- Daily CBG checks   Depression: -- Continue home sertraline   FEN: No fluids, replete lytes prn, HH diet VTE ppx: Lovenox  Code Status: FULL   Dispo: Admit patient to Observation with expected length of stay less than 2 midnights.  SignedReymundo Poll: Milanya Sunderland, MD 12/23/2017, 10:07 AM  Pager: [phone]

## 2017-12-24 DIAGNOSIS — I5043 Acute on chronic combined systolic (congestive) and diastolic (congestive) heart failure: Secondary | ICD-10-CM | POA: Diagnosis not present

## 2017-12-24 DIAGNOSIS — I5023 Acute on chronic systolic (congestive) heart failure: Secondary | ICD-10-CM | POA: Diagnosis not present

## 2017-12-24 DIAGNOSIS — R109 Unspecified abdominal pain: Secondary | ICD-10-CM

## 2017-12-24 DIAGNOSIS — I2510 Atherosclerotic heart disease of native coronary artery without angina pectoris: Secondary | ICD-10-CM | POA: Diagnosis not present

## 2017-12-24 DIAGNOSIS — E871 Hypo-osmolality and hyponatremia: Secondary | ICD-10-CM | POA: Diagnosis not present

## 2017-12-24 DIAGNOSIS — R112 Nausea with vomiting, unspecified: Secondary | ICD-10-CM | POA: Diagnosis not present

## 2017-12-24 LAB — GLUCOSE, CAPILLARY
GLUCOSE-CAPILLARY: 143 mg/dL — AB (ref 70–99)
Glucose-Capillary: 137 mg/dL — ABNORMAL HIGH (ref 70–99)
Glucose-Capillary: 175 mg/dL — ABNORMAL HIGH (ref 70–99)
Glucose-Capillary: 103 mg/dL — ABNORMAL HIGH (ref 70–99)

## 2017-12-24 LAB — HEPATITIS PANEL, ACUTE
HCV Ab: 0.1 s/co ratio (ref 0.0–0.9)
Hep A IgM: NEGATIVE
Hep B C IgM: NEGATIVE
Hepatitis B Surface Ag: NEGATIVE

## 2017-12-24 LAB — BASIC METABOLIC PANEL
Anion gap: 10 (ref 5–15)
BUN: 19 mg/dL (ref 8–23)
CHLORIDE: 94 mmol/L — AB (ref 98–111)
CO2: 23 mmol/L (ref 22–32)
Calcium: 8.4 mg/dL — ABNORMAL LOW (ref 8.9–10.3)
Creatinine, Ser: 1.65 mg/dL — ABNORMAL HIGH (ref 0.44–1.00)
GFR calc Af Amer: 33 mL/min — ABNORMAL LOW (ref >60)
GFR calc non Af Amer: 28 mL/min — ABNORMAL LOW (ref >60)
GLUCOSE: 152 mg/dL — AB (ref 70–99)
POTASSIUM: 4.1 mmol/L (ref 3.5–5.1)
Sodium: 127 mmol/L — ABNORMAL LOW (ref 135–145)

## 2017-12-24 LAB — CBC
HCT: 26.9 % — ABNORMAL LOW (ref 36.0–46.0)
HEMOGLOBIN: 8.6 g/dL — AB (ref 12.0–15.0)
MCH: 25.6 pg — AB (ref 26.0–34.0)
MCHC: 32 g/dL (ref 30.0–36.0)
MCV: 80.1 fL (ref 78.0–100.0)
Platelets: 298 10*3/uL (ref 150–400)
RBC: 3.36 MIL/uL — AB (ref 3.87–5.11)
RDW: 14.6 % (ref 11.5–15.5)
WBC: 5.8 10*3/uL (ref 4.0–10.5)

## 2017-12-24 LAB — URINE CULTURE

## 2017-12-24 LAB — SODIUM, URINE, RANDOM: Sodium, Ur: 71 mmol/L

## 2017-12-24 MED ORDER — FUROSEMIDE 10 MG/ML IJ SOLN
40.0000 mg | Freq: Once | INTRAMUSCULAR | Status: AC
  Administered 2017-12-24: 40 mg via INTRAVENOUS
  Filled 2017-12-24: qty 4

## 2017-12-24 NOTE — Progress Notes (Signed)
Pt has been resting in bed with exception of ambulating to bathroom with SBA. No c/o discomfort. Family present in room with pt. Meds taken without difficulty.

## 2017-12-24 NOTE — Progress Notes (Signed)
Subjective:  Patient seen resting in her bed this morning. Interview conducted with assistance of phone translator. She states she has some abdominal pain this morning but is significantly improved from yesterday. She also endorses a lack of appetite, but denies nausea. She was informed and understands that the pain is caused by fluid build up from her hear failure. She know that the pain is relieved when the fluid comes off during her heart failure exacerbation admissions. She was told that she needs to take he fluid pill at home as this will help prevent fluid build up and pain as well as keep her out of the hospital. She states she is concerned that the fluid pill is causing her to have hot flash like sensations that are uncomfortable for her. Patient and family are wondering if she will be able to go home today, she was told that we will see how she does with more diuretics today and reevaluate; if she does not go today she would be expected to go tomorrow. She has no other question or complaints this morning.  Objective:  Vital signs in last 24 hours: Vitals:   12/23/17 2053 12/24/17 0519 12/24/17 0526 12/24/17 0801  BP: 121/62 120/60  (!) 143/93  Pulse: 76 76  75  Resp: 18 16    Temp: 98.2 F (36.8 C) 98.3 F (36.8 C)  98.4 F (36.9 C)  TempSrc: Oral Oral  Oral  SpO2: 95% 96%  94%  Weight:   112 lb (50.8 kg)   Height:       Physical Exam  Constitutional: She appears well-developed and well-nourished.  HENT:  Head: Normocephalic and atraumatic.  Cardiovascular: Normal rate, regular rhythm, normal heart sounds and intact distal pulses.  Pulmonary/Chest: Effort normal.  Mild bibasilar crackles  Abdominal: Soft. Bowel sounds are normal. She exhibits no distension.  Tenderness to palpation of epigastric region  Musculoskeletal: She exhibits no edema or deformity.  Neurological: She is alert.  Skin: Skin is warm and dry.   Assessment/Plan: 77 yo presenting today with complaint of  abdominal pain, nausea, and one episode of emesis. She was found to be hypervolemic on exam in acute systolic heart failure. Admitted for IV diuresis.   Acute Decompensated HFrEF: Likely due to confusion over medications. Per Dr. Landry Dyke note she should be on lasix 40 mg BID, which she has not been taking. Her Trop was mildly elevated but trend was flat and EKG was without new ischemic changes. Symptoms have been improving with IV Lasix but mild crackles persist, will give additional IV dose this morning. Repeat EKG: old anterolateral infarct, similar to previous. She is concerned that her lasix is the cause of her hot flash like sensations that are uncomfortable for her, but this is not a reported side effect of furosemide and is more like due to another medication if it is medication related. > Strict I/Os: unable to be accurately recorded as patient misses hat when urinating > daily weights (dry weight of 110), now 112 - Cardiac Monitoring - IV lasix 40 mg - Coreg 6.25mg  BID - Imdur 15 mg QD - AM BMP  Hyponatremia: Acute on chronic (labs show Na ranges 126-132). Hypervolemic in the setting of heart failure exacerbation. Urine osm is normal (328), Ur Na 71. Serum osm was not collected prior to lasix. > Na 125 > 129 > 127 - Fluid restrict, diuresis above  - AM BMP  CAD s/p DES to LAD Ischemic cardiomyopathy  - Continue ASA &  Brilinta  - Continue home atorvastatin 80 mg - Continue home coreg 6.25 BID - Continue home Imdur 15 mg QD - Holding home Hydralazine 12.5mg  BID, will monitor BP  Type II DM:  - Continue home glimepiride  - Holding Sitagliptin, follow up PCP - Daily CBG checks   Depression: - Continue home sertraline   FEN: No fluids, replete lytes prn, HH diet VTE ppx: Lovenox  Code Status: FULL   Dispo: Anticipated discharge in approximately 0-1 day(s).   Beola CordMelvin, Alexander, MD 12/24/2017, 10:22 AM Pager: [phone]

## 2017-12-24 NOTE — Evaluation (Signed)
Physical Therapy Evaluation Patient Details Name: Sheila HuntsmanMaria Santos Umana De White MRN: [id number] DOB: 21-Jul-1940 Today's Date: 12/24/2017   History of Present Illness  77 y.o. female admitted with nausea and vomiting, recent admission in April 2019 found to have STEMI, underwent PCI and DES placement with 2 subsequent admissions with recurrent HF. PMH includes: DM2, HOH, combined CHF NYHA class 3.     Clinical Impression  Pt admitted with above diagnosis. Pt currently with functional limitations due to the deficits listed below (see PT Problem List). Video interpreter utilized for visit. PTA, pt living with daughter independent with mobility. Upon eval patient is ambulating 150' with RW and min guard, no SOB or LOB walking, satting well on RA. Pt reports feeling weaker than baseline, discussed reconditioning and therex.  Pt will benefit from skilled PT to increase their independence and safety with mobility to allow discharge to the venue listed below.     BP 100/50 after walking SpO2 >93% during session  HRmax 83    Follow Up Recommendations Home health PT;Supervision/Assistance - 24 hour    Equipment Recommendations  None recommended by PT    Recommendations for Other Services       Precautions / Restrictions Precautions Precautions: Fall Precaution Comments: spanish speaking       Mobility  Bed Mobility Overal bed mobility: Modified Independent                Transfers Overall transfer level: Modified independent Equipment used: None                Ambulation/Gait Ambulation/Gait assistance: Min guard;Supervision Gait Distance (Feet): 150 Feet Assistive device: Rolling walker (2 wheeled) Gait Pattern/deviations: Step-to pattern;Step-through pattern Gait velocity: decrease   General Gait Details: cues for proximity to RW, no overt LOB, no SOB, on RA. SpO2 remained in mid 90s, HRmax 85  Stairs            Wheelchair Mobility    Modified Rankin  (Stroke Patients Only)       Balance Overall balance assessment: Mild deficits observed, not formally tested(RW for dynamic tasks at this time due to feeling weaker )                                           Pertinent Vitals/Pain Pain Assessment: (patient reports stomach feeling better. )    Home Living Family/patient expects to be discharged to:: Private residence Living Arrangements: Children Available Help at Discharge: Family;Available 24 hours/day Type of Home: House Home Access: Level entry     Home Layout: One level Home Equipment: Walker - 2 wheels;Bedside commode Additional Comments: lives with daughter    Prior Function Level of Independence: Independent         Comments: Patient independent with ADL's and ambulation but reportedly looks for things to grab ahold of. Does not drive. Enjoys going to church.      Hand Dominance        Extremity/Trunk Assessment   Upper Extremity Assessment Upper Extremity Assessment: Overall WFL for tasks assessed    Lower Extremity Assessment Lower Extremity Assessment: Overall WFL for tasks assessed(Gross strength 4/5)       Communication   Communication: Prefers language other than [place];Interpreter utilized(Spanish)  Cognition Arousal/Alertness: Awake/alert Behavior During Therapy: WFL for tasks assessed/performed Overall Cognitive Status: Within Functional Limits for tasks assessed  General Comments      Exercises General Exercises - Lower Extremity Hip ABduction/ADduction: 10 reps Straight Leg Raises: 10 reps   Assessment/Plan    PT Assessment Patient needs continued PT services  PT Problem List Decreased strength;Decreased activity tolerance;Decreased balance;Cardiopulmonary status limiting activity;Decreased mobility       PT Treatment Interventions DME instruction;Gait training;Stair training;Functional mobility  training;Therapeutic activities;Therapeutic exercise;Balance training    PT Goals (Current goals can be found in the Care Plan section)  Acute Rehab PT Goals Patient Stated Goal: go home when ready PT Goal Formulation: With patient Time For Goal Achievement: 12/31/17 Potential to Achieve Goals: Good    Frequency Min 3X/week   Barriers to discharge        Co-evaluation               AM-PAC PT "6 Clicks" Daily Activity  Outcome Measure Difficulty turning over in bed (including adjusting bedclothes, sheets and blankets)?: None Difficulty moving from lying on back to sitting on the side of the bed? : None Difficulty sitting down on and standing up from a chair with arms (e.g., wheelchair, bedside commode, etc,.)?: None Help needed moving to and from a bed to chair (including a wheelchair)?: None Help needed walking in hospital room?: A Little Help needed climbing 3-5 steps with a railing? : A Little 6 Click Score: 22    End of Session Equipment Utilized During Treatment: Gait belt Activity Tolerance: Patient tolerated treatment well Patient left: in bed Nurse Communication: Mobility status PT Visit Diagnosis: Unsteadiness on feet (R26.81);Muscle weakness (generalized) (M62.81);Difficulty in walking, not elsewhere classified (R26.2)    Time: 1400-1442(interepter used) PT Time Calculation (min) (ACUTE ONLY): 42 min   Charges:   PT Evaluation $PT Eval Low Complexity: 1 Low PT Treatments $Gait Training: 8-22 mins   PT G Codes:        Etta Grandchild, PT, DPT Acute Rehab Services Pager: [phone]    Etta Grandchild 12/24/2017, 3:02 PM

## 2017-12-24 NOTE — Progress Notes (Signed)
Pt misses front measuring hat in toilet when she urinates. Urine unable to be measured. Additional measuring hat placed in back of toilet. Will continue to monitor.

## 2017-12-25 ENCOUNTER — Encounter: Payer: Self-pay | Admitting: Cardiovascular Disease

## 2017-12-25 DIAGNOSIS — I5023 Acute on chronic systolic (congestive) heart failure: Secondary | ICD-10-CM

## 2017-12-25 DIAGNOSIS — Z7982 Long term (current) use of aspirin: Secondary | ICD-10-CM

## 2017-12-25 DIAGNOSIS — R1012 Left upper quadrant pain: Secondary | ICD-10-CM

## 2017-12-25 DIAGNOSIS — I255 Ischemic cardiomyopathy: Secondary | ICD-10-CM

## 2017-12-25 DIAGNOSIS — Z9114 Patient's other noncompliance with medication regimen: Secondary | ICD-10-CM

## 2017-12-25 DIAGNOSIS — Z7984 Long term (current) use of oral hypoglycemic drugs: Secondary | ICD-10-CM

## 2017-12-25 DIAGNOSIS — K2970 Gastritis, unspecified, without bleeding: Secondary | ICD-10-CM

## 2017-12-25 DIAGNOSIS — E871 Hypo-osmolality and hyponatremia: Secondary | ICD-10-CM

## 2017-12-25 DIAGNOSIS — Z79899 Other long term (current) drug therapy: Secondary | ICD-10-CM

## 2017-12-25 DIAGNOSIS — E119 Type 2 diabetes mellitus without complications: Secondary | ICD-10-CM

## 2017-12-25 DIAGNOSIS — F339 Major depressive disorder, recurrent, unspecified: Secondary | ICD-10-CM

## 2017-12-25 DIAGNOSIS — Z955 Presence of coronary angioplasty implant and graft: Secondary | ICD-10-CM

## 2017-12-25 DIAGNOSIS — I2510 Atherosclerotic heart disease of native coronary artery without angina pectoris: Secondary | ICD-10-CM

## 2017-12-25 LAB — BASIC METABOLIC PANEL
Anion gap: 9 (ref 5–15)
BUN: 21 mg/dL (ref 8–23)
CHLORIDE: 91 mmol/L — AB (ref 98–111)
CO2: 27 mmol/L (ref 22–32)
CREATININE: 1.72 mg/dL — AB (ref 0.44–1.00)
Calcium: 8.5 mg/dL — ABNORMAL LOW (ref 8.9–10.3)
GFR calc Af Amer: 31 mL/min — ABNORMAL LOW (ref >60)
GFR calc non Af Amer: 27 mL/min — ABNORMAL LOW (ref >60)
GLUCOSE: 149 mg/dL — AB (ref 70–99)
Potassium: 4 mmol/L (ref 3.5–5.1)
SODIUM: 127 mmol/L — AB (ref 135–145)

## 2017-12-25 LAB — GLUCOSE, CAPILLARY
GLUCOSE-CAPILLARY: 151 mg/dL — AB (ref 70–99)
Glucose-Capillary: 148 mg/dL — ABNORMAL HIGH (ref 70–99)
Glucose-Capillary: 121 mg/dL — ABNORMAL HIGH (ref 70–99)

## 2017-12-25 MED ORDER — SENNA 8.6 MG PO TABS
1.0000 | ORAL_TABLET | Freq: Once | ORAL | Status: AC
  Administered 2017-12-25: 8.6 mg via ORAL
  Filled 2017-12-25: qty 1

## 2017-12-25 MED ORDER — FAMOTIDINE 20 MG PO TABS: 20.0000 mg | ORAL_TABLET | Freq: Two times a day (BID) | ORAL | 1 refills | Status: DC

## 2017-12-25 MED ORDER — FUROSEMIDE 40 MG PO TABS: 40.0000 mg | ORAL_TABLET | Freq: Two times a day (BID) | ORAL | 2 refills | Status: DC

## 2017-12-25 MED ORDER — GI COCKTAIL ~~LOC~~
30.0000 mL | Freq: Once | ORAL | Status: AC
  Administered 2017-12-25: 30 mL via ORAL
  Filled 2017-12-25: qty 30

## 2017-12-25 NOTE — Progress Notes (Signed)
Subjective:   Patient seen with Dr. Evelene Croon who provided interpretation. Patient states she has had return of LUQ abd crampy pain today, which is the same pain that brought her into the ED initially. It had improved yesterday but returned today. She states it makes her catch her breath and demonstrates increased work of breathing when she says this. She denies nausea, vomiting, diarrhea; she had a BM this AM but had to strain. Her son states that she has had this pain prior to hospitalization but patient states she has not. She states that something she was given on Day 1 of hospitalization improved the pain but did not know what that was.  Objective:  Vital signs in last 24 hours: Vitals:   12/24/17 1131 12/24/17 1628 12/24/17 2123 12/25/17 0636  BP: 114/68 121/72 (!) 150/72 (!) 150/79  Pulse: 65 65 76 75  Resp: 12 12 16 16   Temp: 98.5 F (36.9 C) 97.8 F (36.6 C) 98.3 F (36.8 C) 97.9 F (36.6 C)  TempSrc: Oral Oral Oral Oral  SpO2: 95% 100% 96% 93%  Weight:    108 lb 14.4 oz (49.4 kg)  Height:       Constitutional: appears mildly uncomfortable HEENT: no JVD CV: RRR, no M/R/G Resp: mild bibasilar crackles, no increased work of breathing and patient able to speak in full sentences Ext: No LE edema, DP pulses intact bilaterally Abd: soft, NT, decreased bowel sounds throughout. TTP across upper portions of abdomen; no masses palpated, no rebound tenderness, no guarding  Assessment/Plan: 77 yo presenting with complaint of abdominal pain, nausea, and one episode of emesis. She was found to be hypervolemic on exam in acute systolic heart failure. Admitted for IV diuresis.   Abdominal Pain: Recurrence of abdominal pain which she had on initial presentation; at that time, it was thought to be due to hypervolemia, however today she appears euvolemic to dry. She has decreased bowel sounds throughout and had to strain for BM this AM so may be related to constipation; her exam is not  concerning for ileus or SBO. She has been receiving all the meds throughout her hospitalization (except for zofran) so I am unsure what relived her pain on day of admission.  - GI cocktail - senna - will reassess after lunch  Acute Decompensated HFrEF:  Likely due to confusion over medications. Per Dr. Landry Dyke note she should be on lasix 40 mg BID, which she has not been taking. Her Trop was mildly elevated but trend was flat and EKG was without new ischemic changes. Symptoms have been improving with IV Lasix but mild crackles persist, will give additional IV dose this morning. Repeat EKG: old anterolateral infarct, similar to previous. She is concerned that her lasix is the cause of her hot flash like sensations that are uncomfortable for her, but this is not a reported side effect of furosemide and is more likely due to another medication if it is medication related. Weight today is 108lbs (dry weight 110lbs).  - Strict I/Os: unable to be accurately recorded as patient misses hat when urinating - daily weights (dry weight of 110), now 108 - Coreg 6.25mg  BID - Imdur 15 mg QD - will d/c with lasix 40mg  BID starting tomorrow as previously planned by cards - Close f/u with cards and PCP  Hyponatremia: Acute on chronic (labs show Na ranges 126-132). Initially hypervolemic on admission now euvolemic to mildly dry. Urine osm is normal (328), Ur Na 71. Serum osm was not collected  prior to lasix. Na 125 > 129 > 127>127; may be a little dry  - hold diuresis for today  - f/u [facility] in 1 wk  CAD s/p DES to LAD Ischemic cardiomyopathy  - Continue ASA & Brilinta  - Continue home atorvastatin 80 mg - Continue home coreg 6.25 BID - Continue home Imdur 15 mg QD - Holding home Hydralazine 12.5mg  BID, will monitor BP  Type II DM:  - Continue home glimepiride  - Holding Sitagliptin, follow up PCP - Daily CBG checks   Depression: - Continue home sertraline   FEN: No fluids, replete lytes prn, HH  diet VTE ppx: Lovenox  Code Status: FULL   Dispo: Anticipated discharge in approximately 0-1 day; will reassess patient this afternoon.   Nyra MarketSvalina, Nomi Rudnicki, MD 12/25/2017, 7:14 AM Pager: [phone]

## 2017-12-25 NOTE — Care Management Note (Signed)
Case Management Note Donn PieriniKristi Decie Verne RN, BSN Unit 4E-Case Manager-- 3E weekend coverage [phone]  Patient Details  Name: Sheila HuntsmanMaria Santos Umana De White MRN: [id number] Date of Birth: 07/16/1940  Subjective/Objective:  Pt presented with HF                  Action/Plan: PTA pt lived at home with family- spanish speaking- PCP- Dr. Delrae AlfredMulberry- orders placed for HHRN/PT- family at bedside- address confirmed- will have [facility] assess for charity care program - [facility]. Pt/ family do need interpreter for in depth converations  Expected Discharge Date:                  Expected Discharge Plan:  Home w Home Health Services  In-House Referral:     Discharge planning Services  CM Consult  Post Acute Care Choice:  Home Health Choice offered to:  Patient, Adult Children  DME Arranged:    DME Agency:     HH Arranged:  RN, PT HH Agency:  [facility]  Status of Service:  In process, will continue to follow  If discussed at Long Length of Stay Meetings, dates discussed:    Discharge Disposition:   Additional Comments:  Darrold SpanWebster, Trysten Berti Hall, RN 12/25/2017, 2:15 PM

## 2017-12-25 NOTE — Discharge Summary (Signed)
Name: Sheila White MRN: [id number] DOB: 06-Sep-1940 77 y.o. PCP: Julieanne Manson, MD  Date of Admission: 12/23/2017  4:35 AM Date of Discharge: 12/25/2017 Attending Physician: Earl Lagos, MD  Discharge Diagnosis:  1. Acute Decompensated heart Failure 2. Hyponatremia  Discharge Medications: Allergies as of 12/25/2017   No Known Allergies     Medication List    TAKE these medications   aspirin 81 MG chewable tablet Chew 1 tablet (81 mg total) by mouth daily.   atorvastatin 80 MG tablet Commonly known as:  LIPITOR Take 1 tablet (80 mg total) by mouth daily.   benzonatate 100 MG capsule Commonly known as:  TESSALON Take 1 capsule (100 mg total) by mouth 3 (three) times daily as needed for cough.   carvedilol 6.25 MG tablet Commonly known as:  COREG Take 1 tablet (6.25 mg total) by mouth 2 (two) times daily with a meal.   famotidine 20 MG tablet Commonly known as:  PEPCID Take 1 tablet (20 mg total) by mouth 2 (two) times daily.   Ferrous Gluconate 324 (37.5 Fe) MG Tabs 1 tab by mouth twice daily with half and orange   furosemide 40 MG tablet Commonly known as:  LASIX Take 1 tablet (40 mg total) by mouth 2 (two) times daily.   glimepiride 2 MG tablet Commonly known as:  AMARYL Take 1 tablet (2 mg total) by mouth daily with breakfast.   isosorbide mononitrate 30 MG 24 hr tablet Commonly known as:  IMDUR Take 1 tablet (30 mg total) by mouth daily. What changed:  how much to take   loratadine 10 MG tablet Commonly known as:  CLARITIN Take 1 tablet (10 mg total) by mouth daily.   nitroGLYCERIN 0.4 MG SL tablet Commonly known as:  NITROSTAT Place 1 tablet (0.4 mg total) under the tongue every 5 (five) minutes x 3 doses as needed for chest pain.   pantoprazole 40 MG tablet Commonly known as:  PROTONIX Take 1 tablet (40 mg total) by mouth 2 (two) times daily.   sertraline 50 MG tablet Commonly known as:  ZOLOFT 1/2 tab by mouth daily  for 7 days, then 1 tab daily thereafter. What changed:    how much to take  how to take this  when to take this  additional instructions   sitaGLIPtin 100 MG tablet Commonly known as:  JANUVIA 1/2 tab by mouth once daily with morning meal.   ticagrelor 90 MG Tabs tablet Commonly known as:  BRILINTA Take 90 mg by mouth 2 (two) times daily.       Disposition and follow-up:   Ms.Samarra Wave Calzada was discharged from [facility] in Stable condition.  At the hospital follow up visit please address:  Acute Decompensated HFrEF: - Ensure patient has been adherent with her diuretics - Lasix 40mg  twice daily - Ensure follow up with Caridology - Monitor for recurrent sign and symptoms of volume overload  Abd pain/gastritis:  - continued PPI - added famotidine BID - consider H pylori testing if found appropriate   2.  Labs / imaging needed at time of follow-up: BMP   3.  Pending labs/ test needing follow-up: n/a  Follow-up Appointments: Follow-up Information    [facility]Home Follow up.   Specialty:  Home Health Services Why:  HHRN/PT arranged- they will assess for charity care services Contact information: [address] [place] [address] [phone]        Delrae Alfred,  Lanora ManisElizabeth, MD. Schedule an appointment as soon as possible for a visit in 1 week(s).   Specialty:  Internal Medicine Contact information: [address] [place] [address] [phone]        Lennette BihariKelly, Thomas A, MD Follow up in 2 week(s).   Specialty:  Cardiology Contact information: [address] [place] [address] [phone]           Hospital Course by problem list:  Acute Decompensated HFrEF: Patient presented with abdominal with associated nausea and vomiting. She was noted to be volume overloaded, with crackles on exam and BNP of 2,800,=. Trop was mildly elevated but trend was flat and EKG was without new  ischemic changes. Exacerbation was due to miscomunnication over medications. Patient was supposed to be taking daily Lasix but had not been taking this. Her symptoms improved with IV Lasix and on day of discharge her weight was 108lbs. She was discharged on lasix 40mg  BID. She was continued on coreg 6.25mg  BID, isosorbide 30mg  daily.  Hyponatremia:Acute on chronic. Patient presented with Na of 125 (labs showed Na baseline ranges 126-132). This was hypervolemic hyponatremia and improved to her baseline range with diuresis.  Abdominal Pain: Patient's initial complaint included abdominal pain which was thought to be due to volume overload however it persisted once she was euvolemic. Apparently she had had similar pain before at prior hospitalizations for CHF exacerbation. Pain was across upper abdomen though mainly LUQ, at times crampy, at times sharp; not associated with N/V/D; exam was completely benign. She had abd XR and CT abd/pelvis on admission which did not reveal etiology of abd pain. Acute hep panel was negative. She was continued on home protonix and given trial of GI cocktail which did improve her pain. She was discharged with addition of famotidine BID and close f/u with PCP; if she is still having pain which appear to be gastritis in nature, would consider testing for H pylori in this patient with risk factors for colonization.  CAD s/p DES to LAD, ICM: Patient was continued on asa, brilinta, atorvastatin, coreg, and isosorbide.  Discharge Vitals:   BP (!) 105/58 (BP Location: Right Arm)   Pulse 62   Temp 97.6 F (36.4 C) (Oral)   Resp 17   Ht 4\' 5"  (1.346 m)   Wt 108 lb 14.4 oz (49.4 kg)   SpO2 99%   BMI 27.26 kg/m   Pertinent Labs, Studies, and Procedures:  CBC Latest Ref Rng & Units 12/24/2017 12/23/2017 12/01/2017  WBC 4.0 - 10.5 K/uL 5.8 6.5 5.9  Hemoglobin 12.0 - 15.0 g/dL 5.7(Q8.6(L) 4.6(N9.0(L) 6.2(X9.8(L)  Hematocrit 36.0 - 46.0 % 26.9(L) 28.8(L) 28.8(L)  Platelets 150 - 400 K/uL 298  321 401   CMP Latest Ref Rng & Units 12/25/2017 12/24/2017 12/23/2017  Glucose 70 - 99 mg/dL 528(U149(H) 132(G152(H) 401(U145(H)  BUN 8 - 23 mg/dL 21 19 18   Creatinine 0.44 - 1.00 mg/dL 2.72(Z1.72(H) 3.66(Y1.65(H) 4.03(K1.57(H)  Sodium 135 - 145 mmol/L 127(L) 127(L) 129(L)  Potassium 3.5 - 5.1 mmol/L 4.0 4.1 4.3  Chloride 98 - 111 mmol/L 91(L) 94(L) 93(L)  CO2 22 - 32 mmol/L 27 23 26   Calcium 8.9 - 10.3 mg/dL 7.4(Q8.5(L) 5.9(D8.4(L) 8.9  Total Protein 6.5 - 8.1 g/dL - - -  Total Bilirubin 0.3 - 1.2 mg/dL - - -  Alkaline Phos 38 - 126 U/L - - -  AST 15 - 41 U/L - - -  ALT 0 - 44 U/L - - -   BNP:  2,803  Admission EKG:  EKG Interpretation  Date/Time:  Friday December 23 2017 05:37:29 EDT Ventricular Rate:  83 PR Interval:    QRS Duration: 85 QT Interval:  381 QTC Calculation: 448 R Axis:   155 Text Interpretation:  Sinus rhythm Probable left atrial enlargement Anterolateral infarct, old similar to earlier today Confirmed by Glynn Octave [id number]) on 12/23/2017 5:40:36 AM Also confirmed by Glynn Octave [phone]), editor Jac Canavan, Beverly ([id number])  on 12/23/2017 7:05:35 AM      Abdominal Xray: IMPRESSION: 1. Nonobstructive bowel gas pattern. 2. Increase interstitial pulmonary edema and small bilateral effusions. 3. Stable cardiomegaly and aortic atherosclerosis.  CT Abdomen Pelvis WO: IMPRESSION: - Moderate bilateral pleural effusions are noted with adjacent subsegmental atelectasis or infiltrates. - Mild urinary bladder distention is noted with uniformly thick bladder wall, concerning for possible cystitis.  Discharge Instructions: Discharge Instructions    (HEART FAILURE PATIENTS) Call MD:  Anytime you have any of the following symptoms: 1) 3 pound weight gain in 24 hours or 5 pounds in 1 week 2) shortness of breath, with or without a dry hacking cough 3) swelling in the hands, feet or stomach 4) if you have to sleep on extra pillows at night in order to breathe.   Complete by:  As directed    Call MD for:  difficulty  breathing, headache or visual disturbances   Complete by:  As directed    Call MD for:  extreme fatigue   Complete by:  As directed    Call MD for:  hives   Complete by:  As directed    Call MD for:  persistant dizziness or light-headedness   Complete by:  As directed    Call MD for:  persistant nausea and vomiting   Complete by:  As directed    Call MD for:  redness, tenderness, or signs of infection (pain, swelling, redness, odor or green/yellow discharge around incision site)   Complete by:  As directed    Call MD for:  severe uncontrolled pain   Complete by:  As directed    Call MD for:  temperature >100.4   Complete by:  As directed    Diet - low sodium heart healthy   Complete by:  As directed    Discharge instructions   Complete by:  As directed    Sra. Umana Dr Sondra Come,   Fue hospitalizada por tener mucho fluido en su cuerpo causado por su fallo cardiaco. Le pedimos que por favor comienze a tomar las siguientes medicinas:   1- Lasix (furosemide) 40 miligramos (1 tableta) dos veces al dia empezando manana    2- Continue Coreg (carvedilol) 6.25 miligramos 2 veces al dia con comida   3- Continue Brilinta (ticagrelor) 90 miligramos dos veces al dia   4- Continue Imdur (isosorbide mononitrate) 30 miligramos una vez todos los dias   Pesese por las [employer]. Si aumenta 3 libras en un dia o 5 libras en una semana, llame a su cardiologo porque puede que necesite cambios en la dosis de sus diureticos.   Llamenos si tiene alguna pregunta.    Para su dolor abdomoinal, comienze a tomar Pepcid 2 veces al dia.   Por favor haga una cita de seguimiento con su doctor regular y su cardiologo en las proximas 2 semanas.   Increase activity slowly   Complete by:  As directed       Signed: Nyra Market, MD 12/25/2017, 4:02 PM

## 2017-12-25 NOTE — Progress Notes (Signed)
Patient reports that GI Cocktail was helpful in abdominal discomfort.

## 2018-01-04 ENCOUNTER — Ambulatory Visit: Payer: Self-pay | Admitting: Internal Medicine

## 2018-01-04 ENCOUNTER — Encounter: Payer: Self-pay | Admitting: Internal Medicine

## 2018-01-04 VITALS — BP 100/62 | HR 66 | Resp 12 | Ht <= 58 in | Wt 109.0 lb

## 2018-01-04 DIAGNOSIS — N289 Disorder of kidney and ureter, unspecified: Secondary | ICD-10-CM

## 2018-01-04 DIAGNOSIS — R300 Dysuria: Secondary | ICD-10-CM

## 2018-01-04 DIAGNOSIS — R1013 Epigastric pain: Secondary | ICD-10-CM

## 2018-01-04 DIAGNOSIS — E871 Hypo-osmolality and hyponatremia: Secondary | ICD-10-CM

## 2018-01-04 DIAGNOSIS — D649 Anemia, unspecified: Secondary | ICD-10-CM

## 2018-01-04 DIAGNOSIS — I5041 Acute combined systolic (congestive) and diastolic (congestive) heart failure: Secondary | ICD-10-CM

## 2018-01-04 LAB — POCT URINALYSIS DIPSTICK
Bilirubin, UA: NEGATIVE
GLUCOSE UA: NEGATIVE
Ketones, UA: NEGATIVE
Nitrite, UA: NEGATIVE
PH UA: 6.5 (ref 5.0–8.0)
Protein, UA: POSITIVE — AB
RBC UA: NEGATIVE
UROBILINOGEN UA: 0.2 U/dL

## 2018-01-04 MED ORDER — FUROSEMIDE 40 MG PO TABS: ORAL_TABLET | ORAL | 2 refills | Status: DC

## 2018-01-04 MED ORDER — SERTRALINE HCL 50 MG PO TABS
50.0000 mg | ORAL_TABLET | Freq: Every day | ORAL | 11 refills | Status: DC
Start: 2018-01-04 — End: 2018-07-10

## 2018-01-04 NOTE — Progress Notes (Signed)
Subjective:    Patient ID: Sheila White, female    DOB: [birth year], 77 y.o.   MRN: [id number]  HPI   Back in hospital with epigastric pain on Friday, 12/23/2017.  Her symptoms started the day previous.   Points to her epigastric area as pressure.  Started at noon on 12/22/17.  Her last meal had been breakfast that morning at 8:30 a.m:  Scrambled eggs with spinach and wheat toast, small cup of coffee with milk.   Pain started at noon and gradually worsened until 7 pm and then seemed to improve after having a BM, which was soft and formed without hematochezia or melena.   She felt much better after the BM. She was hungry and had a small chicken breast with vegetables.  Took her evening pills. At 3 am, she awakened with the pain again and also had nausea.  She dry heaved.   Her daughter called EMS and she was taken to the hospital where she was found to be in decompensated CHF with pulmonary edema.   She denies any dyspnea or cough as she has had previously with worsening CHF. Patient states even before she started urinating more, her pain was much better.   She had received medication for nausea, which really seemed to help also with the pain. Just before discharge on Sunday, June 30th, she developed the epigastric pain again.  She was initiated on Famotidine 20 mg twice daily along with her PPI.  She was also given a GI cocktail via notes just prior to discharge and that resolved the pain. The following morning, she developed the pain again.  Her daughter gave her the Famotidine and almost immediately afterward, she felt better.  Same thing occurred in the evening.   Had another bad episode of the pain on Wednesday, July 3rd after discharge.  Since July 4th, no epigastric pain.   She is taking furosemide 40 mg, 1/2 tab in the morning and 1/2 tab in the morning. She is up twice in the night to urinate.  She is having some mild dysuria at end of urination now. CT of abdomen in  Hospital was unremarkable other than small bilateral pleural effusions and thickening of urinary bladder wall suggestive of cystitis.   UA was with small leukocytes and urine culture returned with multiple species with recommendation to reculture, but does not look like they were able to get another sample.  Regarding her fluid balance, patient had been doing well without furosemide and with titration up of her Carvedilol.  The furosemide increase previously caused problems with intravascular dehydration and hyponatremia.    No cough as she was having previously.  No PND or orthopnea symptoms.   Current Meds  Medication Sig  . aspirin 81 MG chewable tablet Chew 1 tablet (81 mg total) by mouth daily.  Marland Kitchen. atorvastatin (LIPITOR) 80 MG tablet Take 1 tablet (80 mg total) by mouth daily.  . carvedilol (COREG) 6.25 MG tablet Take 1 tablet (6.25 mg total) by mouth 2 (two) times daily with a meal.  . clopidogrel (PLAVIX) 75 MG tablet Take 75 mg by mouth daily.  . famotidine (PEPCID) 20 MG tablet Take 1 tablet (20 mg total) by mouth 2 (two) times daily.  . Ferrous Gluconate 324 (37.5 Fe) MG TABS 1 tab by mouth twice daily with half and orange  . furosemide (LASIX) 40 MG tablet Take 1 tablet (40 mg total) by mouth 2 (two) times daily.  Marland Kitchen. glimepiride (AMARYL)  2 MG tablet Take 1 tablet (2 mg total) by mouth daily with breakfast.  . isosorbide mononitrate (IMDUR) 30 MG 24 hr tablet Take 1 tablet (30 mg total) by mouth daily. (Patient taking differently: Take 15 mg by mouth daily. )  . loratadine (CLARITIN) 10 MG tablet Take 1 tablet (10 mg total) by mouth daily.  . pantoprazole (PROTONIX) 40 MG tablet Take 1 tablet (40 mg total) by mouth 2 (two) times daily.  . sertraline (ZOLOFT) 50 MG tablet 1/2 tab by mouth daily for 7 days, then 1 tab daily thereafter. (Patient taking differently: Take 50 mg by mouth at bedtime. )  . sitaGLIPtin (JANUVIA) 100 MG tablet 1/2 tab by mouth once daily with morning meal.   No  Known Allergies    Review of Systems     Objective:   Physical Exam NAD, but fragile appearing as her baseline.  Smiling Lungs:  CTA CV:  RRR without murmur or rub, No S3 or S4.   Abd:  S, mild epigastric tenderness without rebound or peritoneal signs No suprapubic tenderness or flank tenderness. LE:  No edema      Assessment & Plan:  1.  CHF:  Appears compensated currently.  To decrease Furosemide to 20 mg in the morning only for now.  CMP and CBC.  2.  Possible Cystitis:  UA and urine culture.  Went over obtaining a clean catch specimen with daughter.  Treat accordingly  3.  Epigastric pain:  Continue PPI and H2 blocker for now until we get more information regarding urine.  4.  Depression:  Family states she is doing quite well with the Sertraline.  Sleeping well and more energy during day.  Happier.  5.  CAD:  Switching to Plavix now--started yesterday.  Not able to afford Brilinta.  Has lab testing in 2 weeks with Cardiology.  6.  Advance Directives:  Long discussion with daughter, son and patient today about possible trip back to [place] to visit family and whether she is healthy enough.  Discussed we would need to see how she is doing in a few months and then giver her risks of travel and being in a country where the medical care may not be easily accessed.  She would then need to weigh risks vs wanting to see family again. Also, gave them a hard copy of Spanish version of 5 Wishes with idea she needs to decide what a quality of life would be for her following CPR, cardioversion if her heart should stop beating, etc.  Family to have a discussion.  Encouraged them to remember it is her decision.

## 2018-01-05 LAB — COMPREHENSIVE METABOLIC PANEL
A/G RATIO: 1.7 (ref 1.2–2.2)
ALBUMIN: 4 g/dL (ref 3.5–4.8)
ALT: 21 IU/L (ref 0–32)
AST: 17 IU/L (ref 0–40)
Alkaline Phosphatase: 70 IU/L (ref 39–117)
BILIRUBIN TOTAL: 0.3 mg/dL (ref 0.0–1.2)
BUN/Creatinine Ratio: 16 (ref 12–28)
BUN: 26 mg/dL (ref 8–27)
CHLORIDE: 90 mmol/L — AB (ref 96–106)
CO2: 26 mmol/L (ref 20–29)
Calcium: 9.3 mg/dL (ref 8.7–10.3)
Creatinine, Ser: 1.59 mg/dL — ABNORMAL HIGH (ref 0.57–1.00)
GFR, EST AFRICAN AMERICAN: 35 mL/min/{1.73_m2} — AB (ref >59)
GFR, EST NON AFRICAN AMERICAN: 31 mL/min/{1.73_m2} — AB (ref >59)
Globulin, Total: 2.4 g/dL (ref 1.5–4.5)
Glucose: 145 mg/dL — ABNORMAL HIGH (ref 65–99)
POTASSIUM: 4.4 mmol/L (ref 3.5–5.2)
Sodium: 130 mmol/L — ABNORMAL LOW (ref 134–144)
TOTAL PROTEIN: 6.4 g/dL (ref 6.0–8.5)

## 2018-01-05 LAB — CBC WITH DIFFERENTIAL/PLATELET
BASOS: 0 %
Basophils Absolute: 0 10*3/uL (ref 0.0–0.2)
EOS (ABSOLUTE): 0.5 10*3/uL — ABNORMAL HIGH (ref 0.0–0.4)
EOS: 8 %
HEMOGLOBIN: 10 g/dL — AB (ref 11.1–15.9)
Hematocrit: 33.3 % — ABNORMAL LOW (ref 34.0–46.6)
IMMATURE GRANS (ABS): 0 10*3/uL (ref 0.0–0.1)
Immature Granulocytes: 0 %
LYMPHS: 25 %
Lymphocytes Absolute: 1.5 10*3/uL (ref 0.7–3.1)
MCH: 25.4 pg — AB (ref 26.6–33.0)
MCHC: 30 g/dL — ABNORMAL LOW (ref 31.5–35.7)
MCV: 85 fL (ref 79–97)
MONOCYTES: 6 %
Monocytes Absolute: 0.3 10*3/uL (ref 0.1–0.9)
NEUTROS ABS: 3.8 10*3/uL (ref 1.4–7.0)
NEUTROS PCT: 61 %
Platelets: 372 10*3/uL (ref 150–450)
RBC: 3.94 x10E6/uL (ref 3.77–5.28)
RDW: 16.5 % — ABNORMAL HIGH (ref 12.3–15.4)
WBC: 6.2 10*3/uL (ref 3.4–10.8)

## 2018-01-06 LAB — URINE CULTURE

## 2018-01-16 ENCOUNTER — Ambulatory Visit: Payer: Self-pay | Admitting: Internal Medicine

## 2018-01-16 ENCOUNTER — Encounter: Payer: Self-pay | Admitting: Internal Medicine

## 2018-01-16 ENCOUNTER — Telehealth: Payer: Self-pay | Admitting: Cardiovascular Disease

## 2018-01-16 VITALS — BP 122/70 | HR 70 | Resp 12 | Ht <= 58 in | Wt 107.0 lb

## 2018-01-16 DIAGNOSIS — D649 Anemia, unspecified: Secondary | ICD-10-CM

## 2018-01-16 DIAGNOSIS — F419 Anxiety disorder, unspecified: Secondary | ICD-10-CM

## 2018-01-16 DIAGNOSIS — R1013 Epigastric pain: Secondary | ICD-10-CM

## 2018-01-16 DIAGNOSIS — I5041 Acute combined systolic (congestive) and diastolic (congestive) heart failure: Secondary | ICD-10-CM

## 2018-01-16 DIAGNOSIS — N289 Disorder of kidney and ureter, unspecified: Secondary | ICD-10-CM

## 2018-01-16 DIAGNOSIS — E871 Hypo-osmolality and hyponatremia: Secondary | ICD-10-CM

## 2018-01-16 DIAGNOSIS — F329 Major depressive disorder, single episode, unspecified: Secondary | ICD-10-CM

## 2018-01-16 DIAGNOSIS — E785 Hyperlipidemia, unspecified: Secondary | ICD-10-CM

## 2018-01-16 MED ORDER — PANTOPRAZOLE SODIUM 40 MG PO TBEC: DELAYED_RELEASE_TABLET | ORAL | 11 refills | Status: DC

## 2018-01-16 MED ORDER — FAMOTIDINE 20 MG PO TABS: ORAL_TABLET | ORAL | 11 refills | Status: DC

## 2018-01-16 NOTE — Telephone Encounter (Signed)
Spoke with Cherice and and ok to have p2y12 done now at hospital.

## 2018-01-16 NOTE — Progress Notes (Signed)
Subjective:    Patient ID: Sheila White, female    DOB: 1941-05-06, 77 y.o.   MRN: [id number]  HPI   1.  Abdominal pain:  No symptoms of this since before her visit on July 10th when last seen. Appetite is good.   Urinating fine.  Since moving Furosemide 20 mg to morning dose only, she only gets up at 5 a.m. To urinate and able to get back to sleep.   Feels well rested in morning when finally arises.  Gets up at 8 a.m.   She does take a midafternoon nap for about 1 hour, but that has not affected her nighttime sleep. She is taking Protonix 40 mg twice daily and Famotidine 20 mg twice daily.  2.  CAD/CHF:  No problems with chest discomfort or breathing.  No LE edema.  Weight down to 107 lbs.  Family states weight at home seems on target with what we get here.  The family did not call the Cardiology office to find out where to go for the testing to see if she responds to Plavix.  She has now been on Plavix since July 11th.   3.  Dysuria:  Resolved.  Last urine culture was not supportive of UTI.    4.  Anxiety and Depression:  Admits to history of anxiety, but not depression.  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  Daughter and son do not remember her having difficulties in past with depression.  Doing well on Sertraline.  Current Meds  Medication Sig  . aspirin 81 MG chewable tablet Chew 1 tablet (81 mg total) by mouth daily.  Marland Kitchen atorvastatin (LIPITOR) 80 MG tablet Take 1 tablet (80 mg total) by mouth daily.  . carvedilol (COREG) 6.25 MG tablet Take 1 tablet (6.25 mg total) by mouth 2 (two) times daily with a meal.  . clopidogrel (PLAVIX) 75 MG tablet Take 75 mg by mouth daily.  . famotidine (PEPCID) 20 MG tablet Take 1 tablet (20 mg total) by mouth 2 (two) times daily.  . Ferrous Gluconate 324 (37.5 Fe) MG TABS 1 tab by mouth twice daily with half and orange  . furosemide (LASIX) 40 MG tablet 1/2  tab by mouth daily in morning  . glimepiride (AMARYL) 2 MG tablet Take 1 tablet (2 mg total) by mouth daily with breakfast.  . isosorbide mononitrate (IMDUR) 30 MG 24 hr tablet Take 1 tablet (30 mg total) by mouth daily. (Patient taking differently: Take 15 mg by mouth daily. )  . loratadine (CLARITIN) 10 MG tablet Take 1 tablet (10 mg total) by mouth daily.  . nitroGLYCERIN (NITROSTAT) 0.4 MG SL tablet Place 1 tablet (0.4 mg total) under the tongue every 5 (five) minutes x 3 doses as needed for chest pain.  . pantoprazole (PROTONIX) 40 MG tablet Take 1 tablet (40 mg total) by mouth 2 (two) times daily.  . sertraline (ZOLOFT) 50 MG tablet Take 1 tablet (50 mg total) by mouth at bedtime.  . sitaGLIPtin (JANUVIA) 100 MG tablet 1/2 tab by mouth once daily with morning meal.    No Known Allergies   Review of Systems     Objective:   Physical Exam NAD Lungs:  CTA CV:  RRR without murmur or rub.  Mild JVD.  Normal and equal radial and DP pulses.  No LE edema Abd:  S, NT, No HSM or mass, +  BS      Assessment & Plan:  1.  Abdominal pain:  No further symptoms.  Not clear what caused this.  Urine did not show evidence of infection. Would like to keep her on H2 blocker and start a very gradual wean of PPI.   To decrease Pantoprazole by 20 mg every 2 weeks.  Will take 2 months to wean off. To return to former dose if begins having epigastric discomfort.  2.  CAD/CHF:  Instructions given after connecting with Cardiology today [facility] to have lab done since starting Plavix.   She appears to be well compensated today.  3.  Depression/anxiety:  No definite history of depression in past prior to AMI.  Will treat for 1 year and then see about weaning off Sertraline.  4.  Hyperlipidemia:  nonfasting today.  Due for repeat FLP--plan for sometime in next week.  Continue Atorvastatin. Recent hepatic labs have been fine. 5.  Anemia:  CBC with FLP  6.  Renal  insufficiency:  BMP with labs.

## 2018-01-20 ENCOUNTER — Telehealth: Payer: Self-pay | Admitting: *Deleted

## 2018-01-20 ENCOUNTER — Other Ambulatory Visit: Payer: Self-pay

## 2018-01-20 ENCOUNTER — Telehealth: Payer: Self-pay | Admitting: Internal Medicine

## 2018-01-20 ENCOUNTER — Telehealth: Payer: Self-pay | Admitting: Cardiovascular Disease

## 2018-01-20 DIAGNOSIS — Z5181 Encounter for therapeutic drug level monitoring: Secondary | ICD-10-CM

## 2018-01-20 DIAGNOSIS — Z7902 Long term (current) use of antithrombotics/antiplatelets: Principal | ICD-10-CM

## 2018-01-20 LAB — PLATELET INHIBITION P2Y12: PLATELET FUNCTION P2Y12: 235 [PRU] (ref 194–418)

## 2018-01-20 MED ORDER — TICAGRELOR 90 MG PO TABS: 90.0000 mg | ORAL_TABLET | Freq: Two times a day (BID) | ORAL | 5 refills | Status: DC

## 2018-01-20 NOTE — Telephone Encounter (Signed)
Spoke with daughter Clotilde Dieterrosa. Verbalized understanding of stopping plavix and going to Dr. Michel HarrowKelley's office today to pick up samples of Brilinta. I also spoke with Rolly SalterHaley from Dr. Michel HarrowKelley's office and she has 2 months worth of samples to give patient. She has also contacted the drug rep to see about what can be done moving forward in regards to possibly getting continual samples for patient.

## 2018-01-20 NOTE — Telephone Encounter (Signed)
New Message   Kim at the lab in the hospital states the pt is there to get labs for a plavix text but no orders. States the pt says Dr. Tresa EndoKelly sent him. Please call

## 2018-01-20 NOTE — Telephone Encounter (Signed)
Sheila White states she is not a responder to Plavix and needs to go back to [place].   Calling MAP to recall why she could not apply for Brilinta:  She needs to be a citizen. Sheila White will pull out a month's worth of Brilinta while we try to find where we can get this for her. Not supplied with [facility]. Almost $400 per month with [facility]  Need to call patient's daughter or son to go pick up samples of Brilinta and to stop Plavix.  We are again trying to find out where to obtain.  Will check with Sheila White again to see who the Brilinta rep is and see if can get ongoing samples.

## 2018-01-20 NOTE — Telephone Encounter (Signed)
-----   Message from Lennette Biharihomas A Kelly, MD sent at 01/20/2018  2:43 PM EDT ----- Not plavix responsive; dc and resume brilinta 90 mg bid.

## 2018-01-20 NOTE — Telephone Encounter (Signed)
Called Dr. Renne CriglerMulberry's office to make aware of P2Y12 results as they had previously requested patient be switched to Plavix from Brilinta due to being unable to get Brilinta (related to citizenship).   Spoke to Dr. Delrae AlfredMulberry- aware of results and states she is unsure if patient is going to be able to get medication due to cost and cannot apply for assistance.   She request samples provided if possible for now and she will continue to try to get assistance for patient.   She states she will have nurse call patient to let her know to pick samples of brilinta up at our office.   Medication samples have been provided to the patient.  Drug name: Brilinta 90 mg   Qty: 2 months    LOT: [id number]  Exp.Date: 08/2020   Also will discuss with Dr. Tresa EndoKelly when back in office.

## 2018-01-20 NOTE — Telephone Encounter (Signed)
Spoke with Sheila White. [facility] to have P2Y12 lab done. Sheila White does not have order. Lab ordered and faxed to her @ [phone]

## 2018-01-23 ENCOUNTER — Other Ambulatory Visit: Payer: Self-pay

## 2018-01-23 DIAGNOSIS — Z79899 Other long term (current) drug therapy: Secondary | ICD-10-CM

## 2018-01-23 DIAGNOSIS — E785 Hyperlipidemia, unspecified: Secondary | ICD-10-CM

## 2018-01-24 LAB — CBC WITH DIFFERENTIAL/PLATELET
Basophils Absolute: 0.1 10*3/uL (ref 0.0–0.2)
Basos: 1 %
EOS (ABSOLUTE): 0.2 10*3/uL (ref 0.0–0.4)
Eos: 3 %
HEMOGLOBIN: 10.5 g/dL — AB (ref 11.1–15.9)
Hematocrit: 33.1 % — ABNORMAL LOW (ref 34.0–46.6)
IMMATURE GRANS (ABS): 0 10*3/uL (ref 0.0–0.1)
Immature Granulocytes: 0 %
LYMPHS: 22 %
Lymphocytes Absolute: 1.4 10*3/uL (ref 0.7–3.1)
MCH: 25.6 pg — ABNORMAL LOW (ref 26.6–33.0)
MCHC: 31.7 g/dL (ref 31.5–35.7)
MCV: 81 fL (ref 79–97)
MONOCYTES: 8 %
Monocytes Absolute: 0.5 10*3/uL (ref 0.1–0.9)
NEUTROS PCT: 66 %
Neutrophils Absolute: 4.1 10*3/uL (ref 1.4–7.0)
Platelets: 319 10*3/uL (ref 150–450)
RBC: 4.1 x10E6/uL (ref 3.77–5.28)
RDW: 15.6 % — ABNORMAL HIGH (ref 12.3–15.4)
WBC: 6.3 10*3/uL (ref 3.4–10.8)

## 2018-01-24 LAB — BASIC METABOLIC PANEL
BUN/Creatinine Ratio: 20 (ref 12–28)
BUN: 31 mg/dL — AB (ref 8–27)
CHLORIDE: 91 mmol/L — AB (ref 96–106)
CO2: 21 mmol/L (ref 20–29)
Calcium: 9.2 mg/dL (ref 8.7–10.3)
Creatinine, Ser: 1.57 mg/dL — ABNORMAL HIGH (ref 0.57–1.00)
GFR calc Af Amer: 36 mL/min/{1.73_m2} — ABNORMAL LOW (ref >59)
GFR calc non Af Amer: 31 mL/min/{1.73_m2} — ABNORMAL LOW (ref >59)
Glucose: 125 mg/dL — ABNORMAL HIGH (ref 65–99)
Potassium: 4.9 mmol/L (ref 3.5–5.2)
Sodium: 128 mmol/L — ABNORMAL LOW (ref 134–144)

## 2018-01-24 LAB — LIPID PANEL W/O CHOL/HDL RATIO
Cholesterol, Total: 136 mg/dL (ref 100–199)
HDL: 54 mg/dL (ref >39)
LDL Calculated: 65 mg/dL (ref 0–99)
Triglycerides: 85 mg/dL (ref 0–149)
VLDL Cholesterol Cal: 17 mg/dL (ref 5–40)

## 2018-01-30 ENCOUNTER — Ambulatory Visit (INDEPENDENT_AMBULATORY_CARE_PROVIDER_SITE_OTHER): Payer: Self-pay | Admitting: Physician Assistant

## 2018-01-30 ENCOUNTER — Encounter: Payer: Self-pay | Admitting: Physician Assistant

## 2018-01-30 VITALS — BP 122/70 | HR 66 | Ht <= 58 in | Wt 106.0 lb

## 2018-01-30 DIAGNOSIS — I2510 Atherosclerotic heart disease of native coronary artery without angina pectoris: Secondary | ICD-10-CM

## 2018-01-30 DIAGNOSIS — E871 Hypo-osmolality and hyponatremia: Secondary | ICD-10-CM

## 2018-01-30 DIAGNOSIS — N183 Chronic kidney disease, stage 3 unspecified: Secondary | ICD-10-CM

## 2018-01-30 DIAGNOSIS — I5042 Chronic combined systolic (congestive) and diastolic (congestive) heart failure: Secondary | ICD-10-CM

## 2018-01-30 NOTE — Progress Notes (Signed)
Cardiology Office Note   Date:  01/30/2018   ID:  Sheila White, [place] Sep 25, 1940, MRN [id number]  PCP:  Julieanne MansonMulberry, Elizabeth, MD  Cardiologist: Dr. Tresa EndoKelly, 12/20/2017 Theodore Demarkhonda Barrett, PA-C 12/01/2017  Chief Complaint  Patient presents with  . hospital f/u visit    pt has been feeling better since hospital, no Sx.    History of Present Illness: Sheila HuntsmanMaria Santos Umana De White is a 77 y.o. 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  Sodium 125 on admission, 127 at discharge, creatinine 1.72 at discharge PCP visits, Lasix decreased to 20 mg qd.   Sheila White presents for cardiology follow up.  Her weight has been 108-109. She is not usually hungry in the morning, but eats a little so she can take her medications.   She was having belly pain prior to hospitalization, but that has resolved.   She has a little more energy now.  Since she has a little more energy, she feels more like doing things.  She is more active around the house and walking more these days.  Denies orthopnea, LE edema, or PND. She can walk for 10" at a time.  She is not having chest pain or palpitations.  She gets a little lightheaded sometimes when she stands up, but not bad.   Past Medical History:  Diagnosis Date  . Acute combined systolic and diastolic CHF, NYHA class 3 (HCC) 10/10/2017   in setting of MI  . Acute cystitis without hematuria   . Acute on chronic systolic congestive heart failure (HCC)  10/20/2017  . Anxiety and depression 12/19/2017  . Chest pain 10/31/2017  . Chest pain, rule out acute myocardial infarction 11/19/2017  . Coronary artery disease 10/31/2017  . Dyspnea 10/05/2017  . Heart failure (HCC) 12/23/2017  . HOH (hard of hearing)   . Ischemic cardiomyopathy 10/05/2017  . MI, acute, non ST segment elevation (HCC)   . Non-insulin dependent type 2 diabetes mellitus (HCC) 2004  . 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 STEMI involving left anterior descending coronary artery (HCC) 10/05/2017    Past Surgical History:  Procedure Laterality Date  . CORONARY/GRAFT ACUTE MI REVASCULARIZATION N/A 10/05/2017   Procedure: Coronary/Graft Acute MI Revascularization;  Surgeon: Lennette BihariKelly, Thomas A, MD;  [facility];  Service: Cardiovascular;  Laterality: N/A;  . LEFT HEART CATH AND CORONARY ANGIOGRAPHY N/A 10/05/2017   Procedure: LEFT HEART CATH AND CORONARY ANGIOGRAPHY;  Surgeon: Lennette BihariKelly, Thomas A, MD;  Location: [facility] INVASIVE CV LAB;  Service: Cardiovascular;  Laterality: N/A;    Current Outpatient Medications  Medication Sig Dispense Refill  . aspirin 81 MG chewable tablet Chew 1 tablet (81 mg total) by mouth daily. 60 tablet 2  . atorvastatin (LIPITOR) 80 MG tablet Take 1 tablet (80 mg total) by mouth daily. 30 tablet 11  . carvedilol (COREG) 6.25 MG tablet Take 1 tablet (6.25 mg total) by mouth 2 (two)  times daily with a meal. 180 tablet 3  . famotidine (PEPCID) 20 MG tablet 2 tabs by mouth at bedtime. 60 tablet 11  . Ferrous Gluconate 324 (37.5 Fe) MG TABS 1 tab by mouth twice daily with half and orange    . furosemide (LASIX) 40 MG tablet 1/2 tab by mouth daily in morning 60 tablet 2  . glimepiride (AMARYL) 2 MG tablet Take 1 tablet (2 mg total) by mouth daily with breakfast. 30 tablet 1  . isosorbide mononitrate (IMDUR) 30 MG 24 hr tablet Take 1 tablet (30 mg total) by mouth daily. (Patient taking differently: Take 15 mg by mouth daily. ) 90 tablet 3  .  loratadine (CLARITIN) 10 MG tablet Take 1 tablet (10 mg total) by mouth daily. 60 tablet 2  . nitroGLYCERIN (NITROSTAT) 0.4 MG SL tablet Place 1 tablet (0.4 mg total) under the tongue every 5 (five) minutes x 3 doses as needed for chest pain. 25 tablet 0  . pantoprazole (PROTONIX) 40 MG tablet 1 tab by mouth in the morning and 1/2 tab by mouth in the evening decrease by 1/2 tab every 2 weeks as long as tolerates. 60 tablet 11  . sertraline (ZOLOFT) 50 MG tablet Take 1 tablet (50 mg total) by mouth at bedtime. 30 tablet 11  . sitaGLIPtin (JANUVIA) 100 MG tablet 1/2 tab by mouth once daily with morning meal. 30 tablet 11  . ticagrelor (BRILINTA) 90 MG TABS tablet Take 1 tablet (90 mg total) by mouth 2 (two) times daily. 60 tablet 5   No current facility-administered medications for this visit.     Allergies:   Patient has no known allergies.    Social History:  The patient  reports that she has never smoked. She has never used smokeless tobacco. She reports that she does not drink alcohol or use drugs.   Family History:  The patient's family history includes Alcohol abuse in her father.    ROS:  Please see the history of present illness. All other systems are reviewed and negative.    PHYSICAL EXAM: VS:  BP 122/70   Pulse 66   Ht 4\' 10"  (1.473 m)   Wt 106 lb (48.1 kg)   BMI 22.15 kg/m  , BMI Body mass index is 22.15 kg/m. GEN: Well nourished, well developed, female in no acute distress  HEENT: normal for age  Neck: Minimal JVD, no carotid bruit, no masses Cardiac: RRR; soft murmur, no rubs, or gallops Respiratory: Decreased breath sounds bases bilaterally, normal work of breathing GI: soft, nontender, nondistended, + BS MS: no deformity or atrophy; no edema; distal pulses are 2+ in all 4 extremities   Skin: warm and dry, no rash Neuro:  Strength and sensation are intact Psych: euthymic mood, full affect   EKG:  EKG is ordered today. The ekg ordered today demonstrates SR, HR  66.  Late R wave transition, possible left atrial enlargement are unchanged from 12/24/2017  ECHO: 11/20/2017 - Left ventricle: The cavity size was normal. Wall thickness was   increased in a pattern of mild LVH. Systolic function was   moderately to severely reduced. The estimated ejection fraction   was in the range of 30% to 35%. Mid to distal anterior, apical   and inferoapical severe hypokinesis, suggestive of LAD territory   infarct. Doppler parameters are consistent with abnormal left   ventricular relaxation (grade 1 diastolic dysfunction). The E/e&'   ratio is between 8-15, suggesting indeterminate LV filling   pressure. -  Aortic valve: Sclerosis without stenosis. There was trivial   regurgitation. - Mitral valve: Mildly thickened leaflets . There was trivial   regurgitation. - Left atrium: The atrium was mildly dilated. - Inferior vena cava: The vessel was normal in size. The   respirophasic diameter changes were in the normal range (>= 50%),   consistent with normal central venous pressure. Impressions: - Compared to a prior study in 09/2017, the LVEF and WMA&'s are   unchanged. LV filling pressure has improved.   Recent Labs: 11/20/2017: TSH 2.617 11/22/2017: Magnesium 2.3 12/01/2017: NT-Pro BNP 20,354 12/23/2017: B Natriuretic Peptide 2,803.0 01/04/2018: ALT 21 01/23/2018: BUN 31; Creatinine, Ser 1.57; Hemoglobin 10.5; Platelets 319; Potassium 4.9; Sodium 128    Lipid Panel    Component Value Date/Time   CHOL 136 01/23/2018 0947   TRIG 85 01/23/2018 0947   HDL 54 01/23/2018 0947   CHOLHDL 3.3 10/06/2017 0256   VLDL 22 10/06/2017 0256   LDLCALC 65 01/23/2018 0947     Wt Readings from Last 3 Encounters:  01/30/18 106 lb (48.1 kg)  01/16/18 107 lb (48.5 kg)  01/04/18 109 lb (49.4 kg)     Other studies Reviewed: Additional studies/ records that were reviewed today include: Office notes, hospital records and testing.  ASSESSMENT AND PLAN:  1.  Chronic combined  systolic and diastolic CHF: Her blood pressure has limited therapy.  Currently she is tolerating Lasix 20 mg a day and her weight is stable, continue this.  Continue low-dose Lasix, emphasized the need to do basic daily weights.  2.  CAD: She is on aspirin and Brilinta.  She was on Plavix but her P2 Y 12 was above goal at 235 and she is considered a Plavix nonresponder.  Continue Brilinta. - The family does not have insurance to cover it and the cost of the medication is approximately $400 a month. -She is not a candidate for assistance from the manufacturer because she is not a [place] citizen. -We are trying to help her out with samples and they are looking at the clinic to see if there are any options.  3.  Hyponatremia: Her potassium was 4.9 and sodium is 128 when checked a week ago, recheck.  Her creatinine was at baseline at 1.57  4.  CKD III: Follow renal function closely, creatinine 1.57 is baseline for her.  Check today and recheck again in 3 weeks.   Current medicines are reviewed at length with the patient today.  The patient does not have concerns regarding medicines.  The following changes have been made:  no change  Labs/ tests ordered today include:   Orders Placed This Encounter  Procedures  . Basic Metabolic Panel (BMET)  . EKG 12-Lead     Disposition:   FU with Dr. Tresa Endo  Signed, [name], PA-C  01/30/2018 3:05 PM    [facility] Phone: [phone]; Fax: [phone]  This note was written with the assistance of speech recognition software. Please excuse any transcriptional errors.

## 2018-01-30 NOTE — Patient Instructions (Addendum)
Medication Instructions:  Your physician recommends that you continue on your current medications as directed. Please refer to the Current Medication list given to you today. Vivien RotaSIN CAMBIOS DE MEDICINA(NO MEDICINE CHANGES)   Labwork: Your physician recommends that you return for lab work in: 3 weeks BMET Regrese a nuestra oficina para hacerse anlisis de laboratorio en 3 semanas. no se necesita cita    Testing/Procedures: None   Follow-Up: KEEP FOLLOW UP AS SCHEDULED seguimiento segn lo programado   Any Other Special Instructions Will Be Listed Below (If Applicable). If you need a refill on your cardiac medications before your next appointment, please call your pharmacy.

## 2018-02-16 LAB — BASIC METABOLIC PANEL
BUN/Creatinine Ratio: 24 (ref 12–28)
BUN: 35 mg/dL — ABNORMAL HIGH (ref 8–27)
CO2: 22 mmol/L (ref 20–29)
CREATININE: 1.46 mg/dL — AB (ref 0.57–1.00)
Calcium: 9.1 mg/dL (ref 8.7–10.3)
Chloride: 100 mmol/L (ref 96–106)
GFR, EST AFRICAN AMERICAN: 39 mL/min/{1.73_m2} — AB (ref >59)
GFR, EST NON AFRICAN AMERICAN: 34 mL/min/{1.73_m2} — AB (ref >59)
Glucose: 220 mg/dL — ABNORMAL HIGH (ref 65–99)
POTASSIUM: 5 mmol/L (ref 3.5–5.2)
Sodium: 137 mmol/L (ref 134–144)

## 2018-02-28 ENCOUNTER — Other Ambulatory Visit: Payer: Self-pay

## 2018-02-28 MED ORDER — FAMOTIDINE 20 MG PO TABS: ORAL_TABLET | ORAL | 11 refills | Status: DC

## 2018-03-24 ENCOUNTER — Telehealth: Payer: Self-pay | Admitting: Internal Medicine

## 2018-03-24 NOTE — Telephone Encounter (Signed)
Patient requesting Rx on glimepiride (AMARYL) 2 MG tablet; to be sent at the same pharmacy patient was picking up.  Please advise.

## 2018-03-27 ENCOUNTER — Encounter: Payer: Self-pay | Admitting: Cardiovascular Disease

## 2018-03-27 ENCOUNTER — Ambulatory Visit (INDEPENDENT_AMBULATORY_CARE_PROVIDER_SITE_OTHER): Payer: Self-pay | Admitting: Cardiovascular Disease

## 2018-03-27 ENCOUNTER — Other Ambulatory Visit: Payer: Self-pay

## 2018-03-27 VITALS — BP 158/78 | HR 78 | Ht <= 58 in | Wt 106.8 lb

## 2018-03-27 DIAGNOSIS — N183 Chronic kidney disease, stage 3 unspecified: Secondary | ICD-10-CM

## 2018-03-27 DIAGNOSIS — I2510 Atherosclerotic heart disease of native coronary artery without angina pectoris: Secondary | ICD-10-CM

## 2018-03-27 DIAGNOSIS — I255 Ischemic cardiomyopathy: Secondary | ICD-10-CM

## 2018-03-27 DIAGNOSIS — E785 Hyperlipidemia, unspecified: Secondary | ICD-10-CM

## 2018-03-27 DIAGNOSIS — I5042 Chronic combined systolic (congestive) and diastolic (congestive) heart failure: Secondary | ICD-10-CM

## 2018-03-27 DIAGNOSIS — I10 Essential (primary) hypertension: Secondary | ICD-10-CM

## 2018-03-27 MED ORDER — GLIMEPIRIDE 2 MG PO TABS: 2.0000 mg | ORAL_TABLET | Freq: Every day | ORAL | 3 refills | Status: DC

## 2018-03-27 NOTE — Progress Notes (Signed)
Cardiology Office Note    Date:  03/29/2018   ID:  Sheila White, [place] [birth year], MRN [id number]  PCP:  Mack Hook, MD  Cardiologist:  Shelva Majestic, MD   3 month F/u office visit evaluation  History of Present Illness:  Sheila White is a 77 y.o. female who presented to [facility] on October 05, 2017 with an ST segment elevation anterior myocardial infarction with late presentation.  Her symptoms had developed the day prior to admission and became more progressive with reference to shortness of breath and continued chest pain.  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  She had 50% proximal OM1 stenosis followed by 95% distal marginal stenosis and there was mild irregularity of the RCA.  She underwent successful PCI to the LAD with ultimate insertion of a 2.5 x 26 mm Resolute DES stent postdilated 2.8 proximally and 2.68 distally with a 99% stenosis reduced to 0%.  The mid LAD stenosis was treated with DES stenting with a 2 to 5 x 12 mm Resolute stent.  The apical stenosis was treated with PTCA.  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  Her peak creatinine was 3.86 which improved to 1.88 at discharge.  He was ultimately discharged on October 24, 2017 and on Oct 28, 2017 so Sheila White for her initial office evaluation.  Patient has had issues with chronic cough.  Her pericardial symptoms had improved and she was treated with colchicine.    I saw her for initial post hospital follow-up evaluation on Nov 18, 2017.  At that time she denied any recurrent chest pain symptomatology.  She was tentatively scheduled to travel to [place] several days later and I recommended that she postpone this trip.  Since I saw her, she  developed some chest pain and was  hospitalized on May 25 through Nov 22, 2017.  Troponins were normal.  Follow-up echo Doppler study showed an EF of 30 to 35%.  There was mid distal anterior, apical inferoapical severe hypokinesis secondary to her prior LAD territory infarct.  There was grade 1 diastolic dysfunction.  She also complained of some blurry vision with gait abnormality.  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  She was hyponatremic which revolved resolved.  She was seen by Sheila White post discharge on December 01, 2017.  At that time, her blood pressure was stable and pulse 77.  I last saw her in June 2019.  At that time the plan was to start Plavix due to her foreign status and apparently Plavix only rather than Brilinta could be used.  However, P2Y12 test demonstrated that she was not responsive to Plavix.  As a result we have been supplying her with samples of Brilinta.  Presently she denies recurrent chest pain.  She has more strength.  She denies palpitations or significant dyspnea.  She presents for reevaluation.  Past Medical History:  Diagnosis Date  . Acute combined systolic and diastolic CHF, NYHA class 3 ([place]) 10/10/2017   in setting of MI  . Acute cystitis without hematuria   . Acute on chronic systolic congestive heart failure ([place]) 10/20/2017  . Anxiety and depression 12/19/2017  . Chest pain 10/31/2017  . Chest pain, rule out acute myocardial infarction 11/19/2017  . Coronary artery disease 10/31/2017  . Dyspnea 10/05/2017  . Heart failure ([place])  12/23/2017  . HOH (hard of hearing)   . Ischemic cardiomyopathy 10/05/2017  . MI, acute, non ST segment elevation ([place])   . Non-insulin dependent type 2 diabetes mellitus ([place]) 2004  . 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 STEMI involving left anterior descending coronary artery ([place]) 10/05/2017    Past Surgical History:  Procedure Laterality Date  . CORONARY/GRAFT ACUTE MI REVASCULARIZATION N/A 10/05/2017   Procedure: Coronary/Graft Acute MI  Revascularization;  Surgeon: Troy Sine, MD;  Location: [place] [facility];  Service: Cardiovascular;  Laterality: N/A;  . LEFT HEART CATH AND CORONARY ANGIOGRAPHY N/A 10/05/2017   Procedure: LEFT HEART CATH AND CORONARY ANGIOGRAPHY;  Surgeon: Troy Sine, MD;  Location: [place] [facility];  Service: Cardiovascular;  Laterality: N/A;    Current Medications: Outpatient Medications Prior to Visit  Medication Sig Dispense Refill  . aspirin 81 MG chewable tablet Chew 1 tablet (81 mg total) by mouth daily. 60 tablet 2  . atorvastatin (LIPITOR) 80 MG tablet Take 1 tablet (80 mg total) by mouth daily. 30 tablet 11  . carvedilol (COREG) 6.25 MG tablet Take 1 tablet (6.25 mg total) by mouth 2 (two) times daily with a meal. 180 tablet 3  . famotidine (PEPCID) 20 MG tablet 2 tabs by mouth at bedtime. 60 tablet 11  . Ferrous Gluconate 324 (37.5 Fe) MG TABS 1 tab by mouth twice daily with half and orange    . furosemide (LASIX) 40 MG tablet 1/2 tab by mouth daily in morning 60 tablet 2  . glimepiride (AMARYL) 2 MG tablet Take 1 tablet (2 mg total) by mouth daily with breakfast. 30 tablet 3  . isosorbide mononitrate (IMDUR) 30 MG 24 hr tablet Take 1 tablet (30 mg total) by mouth daily. (Patient taking differently: Take 15 mg by mouth daily. ) 90 tablet 3  . loratadine (CLARITIN) 10 MG tablet Take 1 tablet (10 mg total) by mouth daily. 60 tablet 2  . nitroGLYCERIN (NITROSTAT) 0.4 MG SL tablet Place 1 tablet (0.4 mg total) under the tongue every 5 (five) minutes x 3 doses as needed for chest pain. 25 tablet 0  . sertraline (ZOLOFT) 50 MG tablet Take 1 tablet (50 mg total) by mouth at bedtime. 30 tablet 11  . sitaGLIPtin (JANUVIA) 100 MG tablet 1/2 tab by mouth once daily with morning meal. 30 tablet 11  . ticagrelor (BRILINTA) 90 MG TABS tablet Take 1 tablet (90 mg total) by mouth 2 (two) times daily. 60 tablet 5  . pantoprazole (PROTONIX) 40 MG tablet 1 tab by mouth in the morning and 1/2 tab by mouth  in the evening decrease by 1/2 tab every 2 weeks as long as tolerates. (Patient not taking: Reported on 03/27/2018) 60 tablet 11   No facility-administered medications prior to visit.      Allergies:   Patient has no known allergies.   Social History   Socioeconomic History  . Marital status: Widowed    Spouse name: Not on file  . Number of children: 8  . Years of education: 54  . Highest education level: Not on file  Occupational History  . Not on file  Social Needs  . Financial resource strain: Not on file  . Food insecurity:    Worry: Not on file    Inability: Not on file  . Transportation needs:    Medical: Not on file    Non-medical: Not on file  Tobacco Use  . Smoking status: Never Smoker  .  Smokeless tobacco: Never Used  Substance and Sexual Activity  . Alcohol use: Never    Frequency: Never  . Drug use: Never  . Sexual activity: Not Currently  Lifestyle  . Physical activity:    Days per week: Not on file    Minutes per session: Not on file  . Stress: Not on file  Relationships  . Social connections:    Talks on phone: Not on file    Gets together: Not on file    Attends religious service: Not on file    Active member of club or organization: Not on file    Attends meetings of clubs or organizations: Not on file    Relationship status: Not on file  Other Topics Concern  . Not on file  Social History Narrative   Living with daughter, Marisue [place] and her husband and her 3 sons.     Family History:  The patient's family history includes Alcohol abuse in her father.   ROS General: Negative; No fevers, chills, or night sweats;  HEENT: Negative; No changes in vision or hearing, sinus congestion, difficulty swallowing Pulmonary: Negative; No cough, wheezing, shortness of breath, hemoptysis Cardiovascular: See HPI GI: Obvious nausea, resolved Musculoskeletal: Negative; no myalgias, joint pain, or weakness Hematologic/Oncology: Negative; no easy bruising,  bleeding Endocrine: Negative; no heat/cold intolerance; no diabetes Neuro: Negative; no changes in balance, headaches; previous dizziness resolved Skin: Negative; No rashes or skin lesions Psychiatric: Negative; No behavioral problems, depression Sleep: Negative; No snoring, daytime sleepiness, hypersomnolence, bruxism, restless legs, hypnogognic hallucinations, no cataplexy Other comprehensive 14 point system review is negative.   PHYSICAL EXAM:   VS:  BP (!) 158/78   Pulse 78   Ht _0  (1.473 m)   Wt 106 lb 12.8 oz (48.4 kg)   BMI 22.32 kg/m     Repeat blood pressure by me was 128/72 and was without significant orthostatic change  Wt Readings from Last 3 Encounters:  03/27/18 106 lb 12.8 oz (48.4 kg)  01/30/18 106 lb (48.1 kg)  01/16/18 107 lb (48.5 kg)    General: Alert, oriented, no distress.  Skin: normal turgor, no rashes, warm and dry HEENT: Normocephalic, atraumatic. Pupils equal round and reactive to light; sclera anicteric; extraocular muscles intact;  Nose without nasal septal hypertrophy Mouth/Parynx benign; Mallinpatti scale 3 Neck: No JVD, no carotid bruits; normal carotid upstroke Lungs: clear to ausculatation and percussion; no wheezing or rales Chest wall: without tenderness to palpitation Heart: PMI not displaced, RRR, s1 s2 normal, 1/6 systolic murmur, no diastolic murmur, no rubs, gallops, thrills, or heaves Abdomen: soft, nontender; no hepatosplenomehaly, BS+; abdominal aorta nontender and not dilated by palpation. Back: no CVA tenderness Pulses 2+ Musculoskeletal: full range of motion, normal strength, no joint deformities Extremities: no clubbing cyanosis or edema, Homan's sign negative  Neurologic: grossly nonfocal; Cranial nerves grossly wnl Psychologic: Normal mood and affect   Studies/Labs Reviewed:   EKG:  EKG is ordered today.  ECG (independently read by me): Normal sinus rhythm at 78 bpm.  Old anterior MI with QS V1 through V5 and inferior Q  waves.  No ectopy.  December 20, 2017 ECG (independently read by me): Normal sinus rhythm 70 bpm.  Old anterior MI with poor progression V1 through V5 with Q waves noted in 3 and aVF.  Normal intervals.  No ectopy.  11/18/2017 ECG (independently read by me): Sinus rhythm at 86 bpm.  Left anterior hemiblock.  QS complex V1 through V5 consistent with anterior infarct.  Inferior Q waves suggestive of inferior MI.  Normal intervals.  No ectopy.  Recent Labs: BMP Latest Ref Rng & Units 02/15/2018 01/23/2018 01/04/2018  Glucose 65 - 99 mg/dL 220(H) 125(H) 145(H)  BUN 8 - 27 mg/dL 35(H) 31(H) 26  Creatinine 0.57 - 1.00 mg/dL 1.46(H) 1.57(H) 1.59(H)  BUN/Creat Ratio 12 - _0 Sodium 134 - 144 mmol/L 137 128(L) 130(L)  Potassium 3.5 - 5.2 mmol/L 5.0 4.9 4.4  Chloride 96 - 106 mmol/L 100 91(L) 90(L)  CO2 20 - 29 mmol/L _1 Calcium 8.7 - 10.3 mg/dL 9.1 9.2 9.3     Hepatic Function Latest Ref Rng & Units 01/04/2018 12/23/2017 11/19/2017  Total Protein 6.0 - 8.5 g/dL 6.4 6.5 6.4(L)  Albumin 3.5 - 4.8 g/dL 4.0 3.1(L) 3.0(L)  AST 0 - 40 IU/L 17 48(H) 19  ALT 0 - 32 IU/L 21 47(H) 13(L)  Alk Phosphatase 39 - 117 IU/L 70 101 59  Total Bilirubin 0.0 - 1.2 mg/dL 0.3 0.5 0.4  Bilirubin, Direct 0.1 - 0.5 mg/dL - - <0.1(L)    CBC Latest Ref Rng & Units 01/23/2018 01/04/2018 12/24/2017  WBC 3.4 - 10.8 x10E3/uL 6.3 6.2 5.8  Hemoglobin 11.1 - 15.9 g/dL 10.5(L) 10.0(L) 8.6(L)  Hematocrit 34.0 - 46.6 % 33.1(L) 33.3(L) 26.9(L)  Platelets 150 - 450 x10E3/uL 319 372 298   Lab Results  Component Value Date   MCV 81 01/23/2018   MCV 85 01/04/2018   MCV 80.1 12/24/2017   Lab Results  Component Value Date   TSH 2.617 11/20/2017   Lab Results  Component Value Date   HGBA1C 6.6 (H) 10/05/2017     BNP    Component Value Date/Time   BNP 2,803.0 (H) 12/23/2017 0455    ProBNP    Component Value Date/Time   PROBNP 20,354 (H) 12/01/2017 1041     Lipid Panel     Component Value Date/Time    CHOL 136 01/23/2018 0947   TRIG 85 01/23/2018 0947   HDL 54 01/23/2018 0947   CHOLHDL 3.3 10/06/2017 0256   VLDL 22 10/06/2017 0256   LDLCALC 65 01/23/2018 0947     RADIOLOGY: No results found.   Additional studies/ records that were reviewed today include:  I reviewed her 3-week hospitalization records in detail.  I reviewed her follow-up office visit with Sheila White.  Laboratory catheterization studies, echo Doppler assessment were reviewed.  I reviewed her hospitalization, and follow-up office visit since my last evaluation.    ASSESSMENT:    1. Coronary artery disease involving native coronary artery of native heart without angina pectoris   2. Chronic combined systolic (congestive) and diastolic (congestive) heart failure ([place])   3. Ischemic cardiomyopathy   4. Essential hypertension   5. CKD (chronic kidney disease), stage III ([place])   6. Hyperlipidemia with target LDL less than 70     PLAN:   Sheila White is a very pleasant 77 year old Hispanic female who presented with a late presentation anterior ST segment elevation MI.  She had an initial significant ischemic cardiomyopathy with an EF of 30% with grade 2 diastolic dysfunction and developed post infarct pericarditis treated with Decadron and colchicine and during her hospitalization and developed significant hyponatremia, acute kidney injury, with ultimate improvement.  Her creatinine has improved.   She has had issues with low blood pressure and is not on an ACE or ARB therapy due to previous renal insufficiency and low blood pressure.  Remotely, she  had been started on hydralazine but this had to be discontinued due to low blood pressure.  Her most recent follow-up echo still shows an EF of 30 to 35% and is concordant with her late presentation anterior MI.  Her renal function is gradually improving with her most recent creatinine still elevated at 1.46 and a GFR calculated at 34.  Ultimately, the plan will be to try  to initiate low-dose Entresto but will not initiate this presently and hopefully there will be further improvement in renal function.  Presently she is without anginal symptomatology on her current regimen consisting of carvedilol 6.25 mg twice a day, isosorbide 30 mg.  Her blood pressure today is stable and is no longer low.  She was not responsive to Plavix and we have provided her with samples of Brilinta and she currently is on aspirin 81 mg and Brilinta 90 mg twice a day.  She is diabetic on glimepiride and Januvia.  There are no signs of CHF overtly on exam today.  She continues to be on atorvastatin 80 mg for hyperlipidemia.  Most recent LDL cholesterol was at target at 65.  According to her daughter the patient plans to reside with her daughter in the [place] for some time.  As long as she is stable I will see her in 3 to 4 months for reevaluation.    Medication Adjustments/Labs and Tests Ordered: Current medicines are reviewed at length with the patient today.  Concerns regarding medicines are outlined above.  Medication changes, Labs and Tests ordered today are listed in the Patient Instructions below. Patient Instructions  Medication Instructions:  Your physician recommends that you continue on your current medications as directed. Please refer to the Current Medication list given to you today.  Follow-Up: 3-4 months with Dr. Claiborne Billings  Any Other Special Instructions Will Be Listed Below (If Applicable).     If you need a refill on your cardiac medications before your next appointment, please call your pharmacy.      Signed, Shelva Majestic, MD  03/29/2018 7:29 AM    [facility] [address], [place], [place], [place]  [address] Phone: [phone]

## 2018-03-27 NOTE — Telephone Encounter (Signed)
Rx sent to [facility] pharmacy  

## 2018-03-27 NOTE — Patient Instructions (Signed)
Medication Instructions:  Your physician recommends that you continue on your current medications as directed. Please refer to the Current Medication list given to you today.  Follow-Up: 3-4 months with Dr. Kelly  Any Other Special Instructions Will Be Listed Below (If Applicable).     If you need a refill on your cardiac medications before your next appointment, please call your pharmacy.   

## 2018-03-29 ENCOUNTER — Encounter: Payer: Self-pay | Admitting: Cardiovascular Disease

## 2018-04-19 ENCOUNTER — Encounter: Payer: Self-pay | Admitting: Internal Medicine

## 2018-04-19 ENCOUNTER — Ambulatory Visit: Payer: Self-pay | Admitting: Internal Medicine

## 2018-04-19 VITALS — BP 130/78 | HR 76 | Temp 97.8°F | Resp 12 | Ht <= 58 in | Wt 106.0 lb

## 2018-04-19 DIAGNOSIS — Z23 Encounter for immunization: Secondary | ICD-10-CM

## 2018-04-19 DIAGNOSIS — F419 Anxiety disorder, unspecified: Secondary | ICD-10-CM

## 2018-04-19 DIAGNOSIS — H269 Unspecified cataract: Secondary | ICD-10-CM | POA: Insufficient documentation

## 2018-04-19 DIAGNOSIS — D649 Anemia, unspecified: Secondary | ICD-10-CM

## 2018-04-19 DIAGNOSIS — F329 Major depressive disorder, single episode, unspecified: Secondary | ICD-10-CM

## 2018-04-19 DIAGNOSIS — I25118 Atherosclerotic heart disease of native coronary artery with other forms of angina pectoris: Secondary | ICD-10-CM

## 2018-04-19 DIAGNOSIS — E119 Type 2 diabetes mellitus without complications: Secondary | ICD-10-CM

## 2018-04-19 DIAGNOSIS — J069 Acute upper respiratory infection, unspecified: Secondary | ICD-10-CM

## 2018-04-19 DIAGNOSIS — R1013 Epigastric pain: Secondary | ICD-10-CM

## 2018-04-19 LAB — GLUCOSE, POCT (MANUAL RESULT ENTRY): POC Glucose: 262 mg/dl — AB (ref 70–99)

## 2018-04-19 NOTE — Patient Instructions (Signed)
Get flu vaccine sometime next week.  Only at [facility] on [address] and [address].

## 2018-04-19 NOTE — Progress Notes (Signed)
Subjective:    Patient ID: Sheila White, female    DOB: Nov 09, 1940, 77 y.o.   MRN: [id number]  HPI   1.  URI symptoms:  Having coughing and sore throat.  Upper back and posterior neck hurting as well.  No fever.   Cough productive of white sputum, sometimes with a little yellow discoloration. No dyspnea from chest area.  Only had chest discomfort with cough Today is 4th day of illness. Taking Tylenol multi symptom cold remedy with mild improvement. Adult granddaughter with cold symptoms as well.  Has not had flu vaccine this fall.  2.  Abdominal pain:  Off Pantoprazole and without abdominal pain.   Continues on just Famotidine.  Never misses Famotidine.    3.  Depression/anxiety:  Continues on Sertraline 50 mg daily and states doing well.  Very happy and active in helping out in home.  Discussed as we consider whether to wean off med next summer with her history of trauma [place]    4.  CAD/CHF:  Good energy as above.  No chest pain before cold symptoms started and is now much more active.  No swelling of ankles.  No orthopnea or PND symptoms.  5.  Hyperlipidemia:  Controlled well in July  6.  DM:  Never started Januvia as her sugars are running in 90-120 in the morning.  Generally runs just under 150 after eating in the evening.   Continues on the 2 mg of Glimepiride.  Metformin stopped with her CHF following hospitalization in spring with AMI. Daughter believes she received Pneumococcal 23 v in hospital in spring, but unable to find in chart. Not clear if has had Tdap Has not had eye exam in last year. Does not appear she has had PCV 13 but would wait until next April if received 23 v this past April. No flu vaccine yet this year. Family has not been vaccinated  Current Meds  Medication Sig  . aspirin 81 MG chewable tablet Chew 1 tablet (81 mg total) by mouth daily.  Marland Kitchen atorvastatin (LIPITOR) 80 MG tablet Take 1 tablet (80 mg total) by mouth daily.  .  carvedilol (COREG) 6.25 MG tablet Take 1 tablet (6.25 mg total) by mouth 2 (two) times daily with a meal.  . famotidine (PEPCID) 20 MG tablet 2 tabs by mouth at bedtime.  . Ferrous Gluconate 324 (37.5 Fe) MG TABS 1 tab by mouth twice daily with half and orange  . furosemide (LASIX) 40 MG tablet 1/2 tab by mouth daily in morning  . glimepiride (AMARYL) 2 MG tablet Take 1 tablet (2 mg total) by mouth daily with breakfast.  . isosorbide mononitrate (IMDUR) 30 MG 24 hr tablet Take 1 tablet (30 mg total) by mouth daily. (Patient taking differently: Take 15 mg by mouth daily. )  . loratadine (CLARITIN) 10 MG tablet Take 1 tablet (10 mg total) by mouth daily.  . nitroGLYCERIN (NITROSTAT) 0.4 MG SL tablet Place 1 tablet (0.4 mg total) under the tongue every 5 (five) minutes x 3 doses as needed for chest pain.  Marland Kitchen sertraline (ZOLOFT) 50 MG tablet Take 1 tablet (50 mg total) by mouth at bedtime.  . ticagrelor (BRILINTA) 90 MG TABS tablet Take 1 tablet (90 mg total) by mouth 2 (two) times daily.    No Known Allergies  Review of Systems     Objective:   Physical Exam NAD HEENT:  PERRL, EOMI, unable to see discs well due to cataracts.  TMs pearly gray, nasal mucosa a bit swollen, clear mucous, throat without injection, no exudate.   Neck:  Supple, No adenopathy Chest:  CTA CV:  RRR with normal S1 and S2, No S3, S4 or murmur.  Radial pulses normal and equal Abd:  S, NT, No HSM or mass, + BS LE:  No edema.  Varicosities bilaterally--mild to moderate.  Diabetic Foot Exam - Simple   Simple Foot Form Visual Inspection No deformities, no ulcerations, no other skin breakdown bilaterally:  Yes Sensation Testing See comments:  Yes Pulse Check See comments:  Yes Comments Significantly decreased DP and PT pulses bilaterally. Decreased sensation of 10 g monofilament on dorsal aspects of bilateral feet and no sensation on pad of great toe and at plantar 1st MTP area.  Rest of sensation normal Spider veins  involving both feet.       Assessment & Plan:  1.  DM:  A1C, urine microalbumin/crea.  If A1C remains in good control, will discontinue recommendation for Januvia and just maintain on Glimeperide Referral for eye exam Script for free flu vaccine.  Encouraged entire family to undergo flu vaccination to keep her safer. Tdap Checking with epic on whether can see Pneumococcal vaccine given in hospital outside of immunization section.  2.  Hypertension:  Controlled  3.  Hyperlipidemia:  Controlled  4.  CAD/cardiomyopathy:  Stable/compensated.  5.  Depression /anxiety:  With history of trauma from her home country and related anxiety, discussed may want to continue Sertraline indefinitely.  Not clear how affected she was before the MI this past spring, but certainly with depression after her physical decrease with MI.   Will discuss again in early summer/spring as to whether she should taper off med then.  6.  ?PAD:  On exam decreased pulses, but not clear if any symptoms at this time.  Daughter will watch for symptoms and signs.  She is currently much more active and doing well.  7.  Epigastric pain:  Stable on H2 blocker and no PPI.  8.  URI:  Continue with supportive care.  CPE without pap in 4months.

## 2018-04-20 LAB — SPECIMEN STATUS REPORT

## 2018-04-22 LAB — HGB A1C W/O EAG: Hgb A1c MFr Bld: 7.2 % — ABNORMAL HIGH (ref 4.8–5.6)

## 2018-04-22 LAB — CBC WITH DIFFERENTIAL/PLATELET
BASOS ABS: 0.1 10*3/uL (ref 0.0–0.2)
Basos: 1 %
EOS (ABSOLUTE): 0.3 10*3/uL (ref 0.0–0.4)
EOS: 4 %
HEMOGLOBIN: 11.4 g/dL (ref 11.1–15.9)
Hematocrit: 36.1 % (ref 34.0–46.6)
IMMATURE GRANULOCYTES: 0 %
Immature Grans (Abs): 0 10*3/uL (ref 0.0–0.1)
LYMPHS ABS: 1.5 10*3/uL (ref 0.7–3.1)
LYMPHS: 21 %
MCH: 26.2 pg — ABNORMAL LOW (ref 26.6–33.0)
MCHC: 31.6 g/dL (ref 31.5–35.7)
MCV: 83 fL (ref 79–97)
MONOCYTES: 6 %
Monocytes Absolute: 0.5 10*3/uL (ref 0.1–0.9)
NEUTROS PCT: 68 %
Neutrophils Absolute: 5 10*3/uL (ref 1.4–7.0)
Platelets: 271 10*3/uL (ref 150–450)
RBC: 4.35 x10E6/uL (ref 3.77–5.28)
RDW: 16.1 % — ABNORMAL HIGH (ref 12.3–15.4)
WBC: 7.3 10*3/uL (ref 3.4–10.8)

## 2018-04-22 LAB — MICROALBUMIN / CREATININE URINE RATIO
CREATININE, UR: 35 mg/dL
MICROALBUM., U, RANDOM: 58.2 ug/mL
Microalb/Creat Ratio: 166.3 mg/g creat — ABNORMAL HIGH (ref 0.0–30.0)

## 2018-04-22 LAB — SPECIMEN STATUS REPORT

## 2018-04-27 ENCOUNTER — Encounter: Payer: Self-pay | Admitting: Internal Medicine

## 2018-04-27 ENCOUNTER — Ambulatory Visit: Payer: Self-pay | Admitting: Internal Medicine

## 2018-04-27 VITALS — BP 122/72 | HR 76 | Temp 97.7°F | Resp 12 | Ht <= 58 in | Wt 112.0 lb

## 2018-04-27 DIAGNOSIS — E119 Type 2 diabetes mellitus without complications: Secondary | ICD-10-CM

## 2018-04-27 DIAGNOSIS — B349 Viral infection, unspecified: Secondary | ICD-10-CM

## 2018-04-27 LAB — GLUCOSE, POCT (MANUAL RESULT ENTRY): POC Glucose: 133 mg/dl — AB (ref 70–99)

## 2018-04-27 NOTE — Progress Notes (Signed)
   Subjective:    Patient ID: Sheila White, female    DOB: 12-04-1940, 77 y.o.   MRN: [id number]  HPI   Ill since Saturday:  Started coughing,productive of white sputum with nasal congestion and clear rhinorrhea and body aches with a little of sore throat.  No fever.  She was eating and drinking okay that day.   She was using an OTC, Tussin?  Helped a bit. Sunday, basically felt the same way.  Became hoarse about 2 days ago.   She has not had a fever. Cough seemed to worsen yesterday to point she was vomiting.  Vomiting clear liquid.   She has been able to eat and drink, which has not come up with vomiting episodes.  She does have some abdominal upset if she eats soup and can have diarrhea subsequently No one else ill at home.    Has not vomited today.  She feels better today than yesterday.    No swelling of ankles.  Sleeping on 1 pillow.  No PND.   Denies dyspnea.    Current Meds  Medication Sig  . aspirin 81 MG chewable tablet Chew 1 tablet (81 mg total) by mouth daily.  Marland Kitchen atorvastatin (LIPITOR) 80 MG tablet Take 1 tablet (80 mg total) by mouth daily.  . carvedilol (COREG) 6.25 MG tablet Take 1 tablet (6.25 mg total) by mouth 2 (two) times daily with a meal.  . DM-APAP-CPM (CORICIDIN HBP FLU PO) Take by mouth as needed.  . famotidine (PEPCID) 20 MG tablet 2 tabs by mouth at bedtime.  . Ferrous Gluconate 324 (37.5 Fe) MG TABS 1 tab by mouth twice daily with half and orange  . furosemide (LASIX) 40 MG tablet 1/2 tab by mouth daily in morning  . glimepiride (AMARYL) 2 MG tablet Take 1 tablet (2 mg total) by mouth daily with breakfast.  . isosorbide mononitrate (IMDUR) 30 MG 24 hr tablet Take 1 tablet (30 mg total) by mouth daily. (Patient taking differently: Take 15 mg by mouth daily. )  . nitroGLYCERIN (NITROSTAT) 0.4 MG SL tablet Place 1 tablet (0.4 mg total) under the tongue every 5 (five) minutes x 3 doses as needed for chest pain.  Marland Kitchen sertraline (ZOLOFT) 50 MG  tablet Take 1 tablet (50 mg total) by mouth at bedtime.  . ticagrelor (BRILINTA) 90 MG TABS tablet Take 1 tablet (90 mg total) by mouth 2 (two) times daily.   No Known Allergies   Review of Systems     Objective:   Physical Exam Appears somewhat fatigued.  Voice a bit hoarse. HEENT: PERRL, EOMI, TMs pearly gray, throat without injection.  Nasal mucosa swollen and red. Neck:  Supple, No adenopathy Chest:  CTA CV:  RRR without murmur or rub.  Radial and DP pulses normal and equal.  Mild JVD Abd:  S, Mild epigastric tenderness, No HSM or mass, + BS LE:  No edema         Assessment & Plan:  Viral Syndrome, mainly respiratory.   Supportive care.  Coricidin HBP started today and seems to help also, so recommend continuing as needed Push fluids and avoid heavy, fried foods or dairy currently. Call if worsens or does not continue to improve. Flu vaccine once better--hopefully Monday.

## 2018-04-27 NOTE — Patient Instructions (Signed)
Coricidin HBP--do not dose tylenol by itself closer than 4 hours before or after her Coricidin. Ok to give low dose ibuprofen (Advil, Motrin) 200-400 mg if sore throat in between doses of Coricidin

## 2018-05-18 ENCOUNTER — Other Ambulatory Visit: Payer: Self-pay

## 2018-05-18 MED ORDER — FUROSEMIDE 40 MG PO TABS: ORAL_TABLET | ORAL | 2 refills | Status: DC

## 2018-05-22 ENCOUNTER — Telehealth: Payer: Self-pay | Admitting: *Deleted

## 2018-05-22 ENCOUNTER — Ambulatory Visit: Payer: Self-pay | Admitting: Internal Medicine

## 2018-05-22 ENCOUNTER — Encounter: Payer: Self-pay | Admitting: Internal Medicine

## 2018-05-22 VITALS — BP 130/88 | HR 74 | Temp 97.5°F | Resp 12 | Ht <= 58 in | Wt 116.0 lb

## 2018-05-22 DIAGNOSIS — J302 Other seasonal allergic rhinitis: Secondary | ICD-10-CM

## 2018-05-22 MED ORDER — BENZONATATE 100 MG PO CAPS: ORAL_CAPSULE | ORAL | 0 refills | Status: DC

## 2018-05-22 MED ORDER — FLUTICASONE PROPIONATE 50 MCG/ACT NA SUSP: 2.0000 | Freq: Every day | NASAL | 11 refills | Status: DC

## 2018-05-22 NOTE — Telephone Encounter (Signed)
Samples left at front desk of Briilinta 90 mg .Sheila White/cy

## 2018-05-22 NOTE — Patient Instructions (Signed)
Have the teenagers and preteens rake leaves away from house. Take off shoes at door so not tracking in mold/mildew from rain and leaves.

## 2018-05-22 NOTE — Progress Notes (Signed)
Subjective:    Patient ID: Sheila HuntsmanMaria Santos Umana De White, female    DOB: [birth year], 77 y.o.   MRN: [id number]  HPI   Her cough at end of October resolved. She started with a cough again 5 days ago.   Dry, nonproductive cough.  Feels like irritation in her throat sets off the cough.  Describes coughing in paroxysms to point she is breathless.   Difficult to get a good history, but sounds like developed epigastric pain across entire upper abdomen the day after her cough. This hurts most after paroxysm of cough.  No pain there without episode of coughing. No fever. No upper airway congestion or ear pain. She has had itching of eyes and nose.  She has had sneezing more as well.   They do have a lot of leaves in the yard and everyone wears shoes in the house--do not remove at the doorway.   Did start Loratadine again end of last week.  Helped a little bit for the itching, but not the cough.    No dyspnea save for when coughing.  No LE edema.   No diarrhea, constipation or nausea.  No melena or hematochezia. Appetite is good, but once starts coughing, hard to eat, so intake a bit down.  Current Meds  Medication Sig  . aspirin 81 MG chewable tablet Chew 1 tablet (81 mg total) by mouth daily.  Marland Kitchen. atorvastatin (LIPITOR) 80 MG tablet Take 1 tablet (80 mg total) by mouth daily.  . carvedilol (COREG) 6.25 MG tablet Take 1 tablet (6.25 mg total) by mouth 2 (two) times daily with a meal.  . DM-APAP-CPM (CORICIDIN HBP FLU PO) Take by mouth as needed.  . famotidine (PEPCID) 20 MG tablet 2 tabs by mouth at bedtime.  . Ferrous Gluconate 324 (37.5 Fe) MG TABS 1 tab by mouth twice daily with half and orange  . furosemide (LASIX) 40 MG tablet 1/2 tab by mouth daily in morning  . glimepiride (AMARYL) 2 MG tablet Take 1 tablet (2 mg total) by mouth daily with breakfast.  . isosorbide mononitrate (IMDUR) 30 MG 24 hr tablet Take 1 tablet (30 mg total) by mouth daily. (Patient taking differently: Take 15 mg by  mouth daily. )  . loratadine (CLARITIN) 10 MG tablet Take 1 tablet (10 mg total) by mouth daily.  . nitroGLYCERIN (NITROSTAT) 0.4 MG SL tablet Place 1 tablet (0.4 mg total) under the tongue every 5 (five) minutes x 3 doses as needed for chest pain.  Marland Kitchen. sertraline (ZOLOFT) 50 MG tablet Take 1 tablet (50 mg total) by mouth at bedtime.  . ticagrelor (BRILINTA) 90 MG TABS tablet Take 1 tablet (90 mg total) by mouth 2 (two) times daily.   No Known Allergies    Review of Systems     Objective:   Physical Exam NAD Carries on long conversation without obvious dyspnea save when develops cough HEENT: PERRL EOMI,  Tender over frontal and maxillary sinus area. TMs pearly gray, throat without injection.  Nasal mucosa boggy with clear discharge. Neck:  Supple No adenopathy Chest:  CTA CV:  RRR without murmur or rub.  Radial and DP pulses normal and equal Abd:  S, Mild tenderness over upper strap muscles.  No HSM or mass, + BS       Assessment & Plan:  Allergies:  Have the older kids remove leaves from around the house and take off shoes inside entrance of home. Continue Claritin Add Fluticasone nasal spray daily. Tessalon  perles every 8 hours as needed. Call if worsens. Abdominal discomfort appears muscular due to cough.

## 2018-05-30 ENCOUNTER — Encounter: Payer: Self-pay | Admitting: Internal Medicine

## 2018-05-30 ENCOUNTER — Ambulatory Visit: Payer: Self-pay | Admitting: Internal Medicine

## 2018-05-30 VITALS — BP 126/80 | HR 62 | Resp 12 | Ht <= 58 in | Wt 119.0 lb

## 2018-05-30 DIAGNOSIS — I5023 Acute on chronic systolic (congestive) heart failure: Secondary | ICD-10-CM

## 2018-05-30 DIAGNOSIS — R1013 Epigastric pain: Secondary | ICD-10-CM

## 2018-05-30 DIAGNOSIS — J302 Other seasonal allergic rhinitis: Secondary | ICD-10-CM

## 2018-05-30 MED ORDER — FUROSEMIDE 40 MG PO TABS: ORAL_TABLET | ORAL | 2 refills | Status: DC

## 2018-05-30 NOTE — Progress Notes (Signed)
Subjective:    Patient ID: Sheila HuntsmanMaria Santos Umana De White, female    DOB: February 25, 1941, 77 y.o.   MRN: [id number]  HPI   1.  Cough:  Improved--pretty mild now.  They were able to remove the leaves from about the house.  She, however, has been fatigued and perhaps dyspneic.  Rubs her hands over her epigastric area and states hard for her to take a deep breath.  Now that she is not having so much abdominal pain, she is able to take a deep breath without dyspnea.   No fever.     2.  Abdominal pain:  Has not really improved.  Felt to be due to her cough.  Was given a pill yesterday around 10 a.m. And did not have pain until just a little bit ago and was continuous previously.  This was Culturelle an otc probiotic.   Did eat better yesterday after taking the pill.     Current Meds  Medication Sig  . aspirin 81 MG chewable tablet Chew 1 tablet (81 mg total) by mouth daily.  Marland Kitchen. atorvastatin (LIPITOR) 80 MG tablet Take 1 tablet (80 mg total) by mouth daily.  . benzonatate (TESSALON) 100 MG capsule 1 cap by mouth every 8 hours as needed for cough.  . carvedilol (COREG) 6.25 MG tablet Take 1 tablet (6.25 mg total) by mouth 2 (two) times daily with a meal.  . famotidine (PEPCID) 20 MG tablet 2 tabs by mouth at bedtime.  . Ferrous Gluconate 324 (37.5 Fe) MG TABS 1 tab by mouth twice daily with half and orange  . fluticasone (FLONASE) 50 MCG/ACT nasal spray Place 2 sprays into both nostrils daily.  . furosemide (LASIX) 40 MG tablet 1 tab by mouth daily in morning  . glimepiride (AMARYL) 2 MG tablet Take 1 tablet (2 mg total) by mouth daily with breakfast.  . isosorbide mononitrate (IMDUR) 30 MG 24 hr tablet Take 1 tablet (30 mg total) by mouth daily. (Patient taking differently: Take 15 mg by mouth 2 (two) times daily. )  . loratadine (CLARITIN) 10 MG tablet Take 1 tablet (10 mg total) by mouth daily. (Patient taking differently: Take 10 mg by mouth daily as needed for allergies. )  . nitroGLYCERIN  (NITROSTAT) 0.4 MG SL tablet Place 1 tablet (0.4 mg total) under the tongue every 5 (five) minutes x 3 doses as needed for chest pain.  Marland Kitchen. sertraline (ZOLOFT) 50 MG tablet Take 1 tablet (50 mg total) by mouth at bedtime. (Patient taking differently: Take 25 mg by mouth at bedtime. )  . ticagrelor (BRILINTA) 90 MG TABS tablet Take 1 tablet (90 mg total) by mouth 2 (two) times daily.  . [DISCONTINUED] furosemide (LASIX) 40 MG tablet 1/2 tab by mouth daily in morning    No Known Allergies   Review of Systems     Objective:   Physical Exam   Appears tired HEENT:  PERRL, EOMI, TMs pearly gray, throat without injection Neck:  Supple No adenopathy Chest:  Bibasilar crackles. Abd:  S mild epigastric tenderness without rebound or peritoneal signs.  + BS.  No HSM or mass LE:  Mild to moderated edema of feet.  .      Assessment & Plan:  1.  Acute on chronic CHF as compared to last visit.  Increase Furosemide to 60 mg daily. Abdominal pain possibly related to this Elevated feet Limit sodium CBC, CMP Follow in 3 days.  2.  Allergies:  Improved control with UR  symptoms and cough.

## 2018-05-31 ENCOUNTER — Emergency Department (HOSPITAL_COMMUNITY): Payer: Medicaid Other

## 2018-05-31 ENCOUNTER — Encounter (HOSPITAL_COMMUNITY): Payer: Self-pay

## 2018-05-31 ENCOUNTER — Inpatient Hospital Stay (HOSPITAL_COMMUNITY)
Admission: EM | Admit: 2018-05-31 | Discharge: 2018-06-04 | DRG: 291 | Disposition: A | Discharge status: Home / Self Care | Payer: Medicaid Other | Attending: Family Medicine | Admitting: Family Medicine

## 2018-05-31 DIAGNOSIS — J988 Other specified respiratory disorders: Secondary | ICD-10-CM | POA: Clinically undetermined

## 2018-05-31 DIAGNOSIS — I959 Hypotension, unspecified: Secondary | ICD-10-CM | POA: Diagnosis present

## 2018-05-31 DIAGNOSIS — I2510 Atherosclerotic heart disease of native coronary artery without angina pectoris: Secondary | ICD-10-CM | POA: Diagnosis present

## 2018-05-31 DIAGNOSIS — Z7984 Long term (current) use of oral hypoglycemic drugs: Secondary | ICD-10-CM

## 2018-05-31 DIAGNOSIS — I10 Essential (primary) hypertension: Secondary | ICD-10-CM

## 2018-05-31 DIAGNOSIS — I5041 Acute combined systolic (congestive) and diastolic (congestive) heart failure: Secondary | ICD-10-CM | POA: Diagnosis present

## 2018-05-31 DIAGNOSIS — Z955 Presence of coronary angioplasty implant and graft: Secondary | ICD-10-CM

## 2018-05-31 DIAGNOSIS — N183 Chronic kidney disease, stage 3 unspecified: Secondary | ICD-10-CM

## 2018-05-31 DIAGNOSIS — F419 Anxiety disorder, unspecified: Secondary | ICD-10-CM | POA: Diagnosis present

## 2018-05-31 DIAGNOSIS — E1122 Type 2 diabetes mellitus with diabetic chronic kidney disease: Secondary | ICD-10-CM | POA: Diagnosis present

## 2018-05-31 DIAGNOSIS — R0989 Other specified symptoms and signs involving the circulatory and respiratory systems: Secondary | ICD-10-CM | POA: Diagnosis present

## 2018-05-31 DIAGNOSIS — Z811 Family history of alcohol abuse and dependence: Secondary | ICD-10-CM

## 2018-05-31 DIAGNOSIS — I509 Heart failure, unspecified: Secondary | ICD-10-CM

## 2018-05-31 DIAGNOSIS — I5023 Acute on chronic systolic (congestive) heart failure: Secondary | ICD-10-CM | POA: Diagnosis present

## 2018-05-31 DIAGNOSIS — E119 Type 2 diabetes mellitus without complications: Secondary | ICD-10-CM

## 2018-05-31 DIAGNOSIS — I255 Ischemic cardiomyopathy: Secondary | ICD-10-CM | POA: Diagnosis present

## 2018-05-31 DIAGNOSIS — F32A Depression, unspecified: Secondary | ICD-10-CM

## 2018-05-31 DIAGNOSIS — Z7982 Long term (current) use of aspirin: Secondary | ICD-10-CM

## 2018-05-31 DIAGNOSIS — H269 Unspecified cataract: Secondary | ICD-10-CM | POA: Diagnosis present

## 2018-05-31 DIAGNOSIS — Z79899 Other long term (current) drug therapy: Secondary | ICD-10-CM

## 2018-05-31 DIAGNOSIS — H9190 Unspecified hearing loss, unspecified ear: Secondary | ICD-10-CM | POA: Diagnosis present

## 2018-05-31 DIAGNOSIS — I252 Old myocardial infarction: Secondary | ICD-10-CM

## 2018-05-31 DIAGNOSIS — F329 Major depressive disorder, single episode, unspecified: Secondary | ICD-10-CM | POA: Diagnosis present

## 2018-05-31 DIAGNOSIS — E1136 Type 2 diabetes mellitus with diabetic cataract: Secondary | ICD-10-CM | POA: Diagnosis present

## 2018-05-31 DIAGNOSIS — I130 Hypertensive heart and chronic kidney disease with heart failure and stage 1 through stage 4 chronic kidney disease, or unspecified chronic kidney disease: Principal | ICD-10-CM | POA: Diagnosis present

## 2018-05-31 DIAGNOSIS — E871 Hypo-osmolality and hyponatremia: Secondary | ICD-10-CM | POA: Diagnosis present

## 2018-05-31 DIAGNOSIS — Z7902 Long term (current) use of antithrombotics/antiplatelets: Secondary | ICD-10-CM

## 2018-05-31 DIAGNOSIS — I5043 Acute on chronic combined systolic (congestive) and diastolic (congestive) heart failure: Secondary | ICD-10-CM | POA: Diagnosis present

## 2018-05-31 LAB — COMPREHENSIVE METABOLIC PANEL
A/G RATIO: 1.6 (ref 1.2–2.2)
ALT: 45 IU/L — AB (ref 0–32)
AST: 36 IU/L (ref 0–40)
Albumin: 3.6 g/dL (ref 3.5–4.8)
Alkaline Phosphatase: 90 IU/L (ref 39–117)
BUN/Creatinine Ratio: 15 (ref 12–28)
BUN: 18 mg/dL (ref 8–27)
Bilirubin Total: 0.5 mg/dL (ref 0.0–1.2)
CALCIUM: 8.7 mg/dL (ref 8.7–10.3)
CO2: 22 mmol/L (ref 20–29)
CREATININE: 1.22 mg/dL — AB (ref 0.57–1.00)
Chloride: 90 mmol/L — ABNORMAL LOW (ref 96–106)
GFR, EST AFRICAN AMERICAN: 49 mL/min/{1.73_m2} — AB (ref >59)
GFR, EST NON AFRICAN AMERICAN: 42 mL/min/{1.73_m2} — AB (ref >59)
GLOBULIN, TOTAL: 2.2 g/dL (ref 1.5–4.5)
Glucose: 150 mg/dL — ABNORMAL HIGH (ref 65–99)
Potassium: 5.3 mmol/L — ABNORMAL HIGH (ref 3.5–5.2)
Sodium: 125 mmol/L — ABNORMAL LOW (ref 134–144)
TOTAL PROTEIN: 5.8 g/dL — AB (ref 6.0–8.5)

## 2018-05-31 LAB — BASIC METABOLIC PANEL
ANION GAP: 10 (ref 5–15)
BUN: 23 mg/dL (ref 8–23)
CO2: 24 mmol/L (ref 22–32)
Calcium: 8.5 mg/dL — ABNORMAL LOW (ref 8.9–10.3)
Chloride: 93 mmol/L — ABNORMAL LOW (ref 98–111)
Creatinine, Ser: 1.13 mg/dL — ABNORMAL HIGH (ref 0.44–1.00)
GFR calc Af Amer: 54 mL/min — ABNORMAL LOW (ref >60)
GFR calc non Af Amer: 46 mL/min — ABNORMAL LOW (ref >60)
GLUCOSE: 122 mg/dL — AB (ref 70–99)
Potassium: 4.1 mmol/L (ref 3.5–5.1)
Sodium: 127 mmol/L — ABNORMAL LOW (ref 135–145)

## 2018-05-31 LAB — CBC WITH DIFFERENTIAL/PLATELET
Basophils Absolute: 0 10*3/uL (ref 0.0–0.2)
Basos: 1 %
EOS (ABSOLUTE): 0.3 10*3/uL (ref 0.0–0.4)
Eos: 4 %
HEMATOCRIT: 36.4 % (ref 34.0–46.6)
Hemoglobin: 11.9 g/dL (ref 11.1–15.9)
IMMATURE GRANS (ABS): 0 10*3/uL (ref 0.0–0.1)
IMMATURE GRANULOCYTES: 0 %
LYMPHS: 13 %
Lymphocytes Absolute: 1 10*3/uL (ref 0.7–3.1)
MCH: 27.8 pg (ref 26.6–33.0)
MCHC: 32.7 g/dL (ref 31.5–35.7)
MCV: 85 fL (ref 79–97)
MONOCYTES: 7 %
MONOS ABS: 0.5 10*3/uL (ref 0.1–0.9)
NEUTROS PCT: 75 %
Neutrophils Absolute: 6.2 10*3/uL (ref 1.4–7.0)
Platelets: 275 10*3/uL (ref 150–450)
RBC: 4.28 x10E6/uL (ref 3.77–5.28)
RDW: 16.5 % — AB (ref 12.3–15.4)
WBC: 8.1 10*3/uL (ref 3.4–10.8)

## 2018-05-31 LAB — CBC
HCT: 36.6 % (ref 36.0–46.0)
Hemoglobin: 11.5 g/dL — ABNORMAL LOW (ref 12.0–15.0)
MCH: 26.9 pg (ref 26.0–34.0)
MCHC: 31.4 g/dL (ref 30.0–36.0)
MCV: 85.7 fL (ref 80.0–100.0)
Platelets: 264 10*3/uL (ref 150–400)
RBC: 4.27 MIL/uL (ref 3.87–5.11)
RDW: 17 % — ABNORMAL HIGH (ref 11.5–15.5)
WBC: 7.4 10*3/uL (ref 4.0–10.5)
nRBC: 0 % (ref 0.0–0.2)

## 2018-05-31 LAB — BRAIN NATRIURETIC PEPTIDE: B Natriuretic Peptide: 3318.2 pg/mL — ABNORMAL HIGH (ref 0.0–100.0)

## 2018-05-31 LAB — I-STAT TROPONIN, ED: Troponin i, poc: 0.03 ng/mL (ref 0.00–0.08)

## 2018-05-31 NOTE — ED Triage Notes (Signed)
Pt has dry cough since last week, bilateral leg swelling with 2+ pitting edema since yesterday, hx of CHF, denies CP or SOB

## 2018-06-01 ENCOUNTER — Other Ambulatory Visit: Payer: Self-pay

## 2018-06-01 ENCOUNTER — Encounter (HOSPITAL_COMMUNITY): Payer: Self-pay | Admitting: General Practice

## 2018-06-01 DIAGNOSIS — I10 Essential (primary) hypertension: Secondary | ICD-10-CM

## 2018-06-01 DIAGNOSIS — I5023 Acute on chronic systolic (congestive) heart failure: Secondary | ICD-10-CM | POA: Diagnosis present

## 2018-06-01 DIAGNOSIS — E871 Hypo-osmolality and hyponatremia: Secondary | ICD-10-CM

## 2018-06-01 DIAGNOSIS — E1159 Type 2 diabetes mellitus with other circulatory complications: Secondary | ICD-10-CM

## 2018-06-01 LAB — CREATININE, SERUM
Creatinine, Ser: 1.27 mg/dL — ABNORMAL HIGH (ref 0.44–1.00)
GFR calc Af Amer: 46 mL/min — ABNORMAL LOW (ref >60)
GFR, EST NON AFRICAN AMERICAN: 40 mL/min — AB (ref >60)

## 2018-06-01 LAB — CBC
HCT: 37.3 % (ref 36.0–46.0)
Hemoglobin: 11.9 g/dL — ABNORMAL LOW (ref 12.0–15.0)
MCH: 27.2 pg (ref 26.0–34.0)
MCHC: 31.9 g/dL (ref 30.0–36.0)
MCV: 85.4 fL (ref 80.0–100.0)
NRBC: 0 % (ref 0.0–0.2)
Platelets: 235 10*3/uL (ref 150–400)
RBC: 4.37 MIL/uL (ref 3.87–5.11)
RDW: 16.9 % — ABNORMAL HIGH (ref 11.5–15.5)
WBC: 9.1 10*3/uL (ref 4.0–10.5)

## 2018-06-01 LAB — GLUCOSE, CAPILLARY
Glucose-Capillary: 98 mg/dL (ref 70–99)
Glucose-Capillary: 105 mg/dL — ABNORMAL HIGH (ref 70–99)

## 2018-06-01 MED ORDER — FLUTICASONE PROPIONATE 50 MCG/ACT NA SUSP
2.0000 | Freq: Every day | NASAL | Status: DC | PRN
  Filled 2018-06-01: qty 16

## 2018-06-01 MED ORDER — FUROSEMIDE 10 MG/ML IJ SOLN
40.0000 mg | Freq: Once | INTRAMUSCULAR | Status: AC
  Administered 2018-06-01: 40 mg via INTRAVENOUS
  Filled 2018-06-01: qty 4

## 2018-06-01 MED ORDER — SERTRALINE HCL 50 MG PO TABS
25.0000 mg | ORAL_TABLET | Freq: Every day | ORAL | Status: DC
  Administered 2018-06-01 – 2018-06-03 (×3): 25 mg via ORAL
  Filled 2018-06-01 (×3): qty 1

## 2018-06-01 MED ORDER — ATORVASTATIN CALCIUM 80 MG PO TABS
80.0000 mg | ORAL_TABLET | Freq: Every day | ORAL | Status: DC
  Administered 2018-06-01 – 2018-06-04 (×4): 80 mg via ORAL
  Filled 2018-06-01 (×4): qty 1

## 2018-06-01 MED ORDER — INSULIN ASPART 100 UNIT/ML ~~LOC~~ SOLN
0.0000 [IU] | Freq: Three times a day (TID) | SUBCUTANEOUS | Status: DC
  Administered 2018-06-02 (×2): 2 [IU] via SUBCUTANEOUS
  Administered 2018-06-03: 1 [IU] via SUBCUTANEOUS
  Administered 2018-06-03 (×2): 2 [IU] via SUBCUTANEOUS
  Administered 2018-06-04: 3 [IU] via SUBCUTANEOUS

## 2018-06-01 MED ORDER — TICAGRELOR 90 MG PO TABS
90.0000 mg | ORAL_TABLET | Freq: Two times a day (BID) | ORAL | Status: DC
  Administered 2018-06-01 – 2018-06-04 (×7): 90 mg via ORAL
  Filled 2018-06-01 (×7): qty 1

## 2018-06-01 MED ORDER — ONDANSETRON HCL 4 MG PO TABS: 4.0000 mg | ORAL_TABLET | Freq: Four times a day (QID) | ORAL | Status: DC | PRN

## 2018-06-01 MED ORDER — NITROGLYCERIN 0.4 MG SL SUBL: 0.4000 mg | SUBLINGUAL_TABLET | SUBLINGUAL | Status: DC | PRN

## 2018-06-01 MED ORDER — FAMOTIDINE 20 MG PO TABS
20.0000 mg | ORAL_TABLET | Freq: Every day | ORAL | Status: DC
  Administered 2018-06-01 – 2018-06-04 (×4): 20 mg via ORAL
  Filled 2018-06-01 (×4): qty 1

## 2018-06-01 MED ORDER — BENZONATATE 100 MG PO CAPS
100.0000 mg | ORAL_CAPSULE | Freq: Three times a day (TID) | ORAL | Status: DC | PRN
  Administered 2018-06-02 – 2018-06-04 (×3): 100 mg via ORAL
  Filled 2018-06-01 (×3): qty 1

## 2018-06-01 MED ORDER — ASPIRIN 81 MG PO CHEW
81.0000 mg | CHEWABLE_TABLET | Freq: Every day | ORAL | Status: DC
  Administered 2018-06-01 – 2018-06-04 (×4): 81 mg via ORAL
  Filled 2018-06-01 (×4): qty 1

## 2018-06-01 MED ORDER — ISOSORBIDE MONONITRATE ER 30 MG PO TB24
30.0000 mg | ORAL_TABLET | Freq: Every day | ORAL | Status: DC
  Administered 2018-06-01 – 2018-06-04 (×4): 30 mg via ORAL
  Filled 2018-06-01 (×4): qty 1

## 2018-06-01 MED ORDER — ENOXAPARIN SODIUM 40 MG/0.4ML ~~LOC~~ SOLN
40.0000 mg | SUBCUTANEOUS | Status: DC
  Administered 2018-06-01 – 2018-06-02 (×2): 40 mg via SUBCUTANEOUS
  Filled 2018-06-01 (×3): qty 0.4

## 2018-06-01 MED ORDER — LORATADINE 10 MG PO TABS: 10.0000 mg | ORAL_TABLET | Freq: Every day | ORAL | Status: DC | PRN

## 2018-06-01 MED ORDER — INSULIN ASPART 100 UNIT/ML ~~LOC~~ SOLN: 0.0000 [IU] | Freq: Three times a day (TID) | SUBCUTANEOUS | Status: DC

## 2018-06-01 MED ORDER — FUROSEMIDE 10 MG/ML IJ SOLN
40.0000 mg | Freq: Two times a day (BID) | INTRAMUSCULAR | Status: DC
  Administered 2018-06-01 – 2018-06-03 (×4): 40 mg via INTRAVENOUS
  Filled 2018-06-01 (×4): qty 4

## 2018-06-01 MED ORDER — ONDANSETRON HCL 4 MG/2ML IJ SOLN
4.0000 mg | Freq: Four times a day (QID) | INTRAMUSCULAR | Status: DC | PRN
  Filled 2018-06-01: qty 2

## 2018-06-01 MED ORDER — ACETAMINOPHEN 325 MG PO TABS
650.0000 mg | ORAL_TABLET | Freq: Four times a day (QID) | ORAL | Status: DC | PRN
  Administered 2018-06-01: 650 mg via ORAL
  Filled 2018-06-01: qty 2

## 2018-06-01 MED ORDER — CARVEDILOL 6.25 MG PO TABS
6.2500 mg | ORAL_TABLET | Freq: Two times a day (BID) | ORAL | Status: DC
  Administered 2018-06-01 – 2018-06-04 (×7): 6.25 mg via ORAL
  Filled 2018-06-01 (×6): qty 1

## 2018-06-01 MED ORDER — CARVEDILOL 12.5 MG PO TABS
6.2500 mg | ORAL_TABLET | Freq: Two times a day (BID) | ORAL | Status: DC
  Filled 2018-06-01: qty 1

## 2018-06-01 MED ORDER — ACETAMINOPHEN 650 MG RE SUPP: 650.0000 mg | Freq: Four times a day (QID) | RECTAL | Status: DC | PRN

## 2018-06-01 NOTE — ED Provider Notes (Signed)
[facility] EMERGENCY DEPARTMENT Provider Note   CSN: [id number] Arrival date & time: 05/31/18  2129     History   Chief Complaint Chief Complaint  Patient presents with  . Leg Swelling  . Cough    HPI Sheila White is a 77 y.o. female.  HPI 77 year old female with history of heart failure with reduced ejection fraction, last EF 30 to 35%, history of MI, here with shortness of breath.  The patient and daughter state that over the last several days, the patient has had progressively worsening dry, nonproductive cough.  She has had acute worsening of this cough over the last 24 hours and essentially was unable to get out of bed throughout the day.  She is also had significant increase in bilateral lower extremity edema.  She denies any chest pain.  She does endorse some mild abdominal fullness.  No fevers.  She is been taking her medications as prescribed.  Denies any significant dietary indiscretion.  No specific alleviating factors.  She continues to make normal urine output.  Symptoms are worse when lying flat or with exertion.  Past Medical History:  Diagnosis Date  . Acute combined systolic and diastolic CHF, NYHA class 3 (HCC) 10/10/2017   in setting of MI  . Acute cystitis without hematuria   . Acute on chronic systolic congestive heart failure (HCC) 10/20/2017  . Anxiety and depression 12/19/2017  . Chest pain 10/31/2017  . Chest pain, rule out acute myocardial infarction 11/19/2017  . Coronary artery disease 10/31/2017  . Dyspnea 10/05/2017  . Heart failure (HCC) 12/23/2017  . HOH (hard of hearing)   . Ischemic cardiomyopathy 10/05/2017  . MI, acute, non ST segment elevation (HCC)   . Non-insulin dependent type 2 diabetes mellitus (HCC) 2004  . 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 STEMI involving left anterior descending coronary artery (HCC) 10/05/2017    Patient Active Problem List   Diagnosis Date Noted  . Acute on chronic  systolic CHF (congestive heart failure) (HCC) 06/01/2018  . Cataract of both eyes 04/19/2018  . Heart failure (HCC) 12/23/2017  . Anxiety and depression 12/19/2017  . Chest pain, rule out acute myocardial infarction 11/19/2017  . Epigastric abdominal pain   . Renal insufficiency 10/31/2017  . Normocytic anemia 10/31/2017  . Coronary artery disease 10/31/2017  . Chest pain 10/31/2017  . Acute lower UTI 10/31/2017  . Acute cystitis without hematuria   . Cough   . Hyponatremia   . Acute on chronic systolic congestive heart failure (HCC) 10/20/2017  . Abdominal pain   . Acute combined systolic and diastolic CHF, NYHA class 3 (HCC) 10/10/2017  . MI, acute, non ST segment elevation (HCC)   . Ischemic cardiomyopathy   . STEMI (ST elevation myocardial infarction) (HCC) 10/05/2017  . Dyspnea 10/05/2017  . HOH (hard of hearing) 10/05/2017  . STEMI involving left anterior descending coronary artery (HCC) 10/05/2017  . Non-insulin dependent type 2 diabetes mellitus (HCC) 06/28/2002    Past Surgical History:  Procedure Laterality Date  . CORONARY/GRAFT ACUTE MI REVASCULARIZATION N/A 10/05/2017   Procedure: Coronary/Graft Acute MI Revascularization;  Surgeon: Lennette Bihari, MD;  Location: [facility] INVASIVE CV LAB;  Service: Cardiovascular;  Laterality: N/A;  . LEFT HEART CATH AND CORONARY ANGIOGRAPHY N/A 10/05/2017   Procedure: LEFT HEART CATH AND CORONARY ANGIOGRAPHY;  Surgeon: Lennette Bihari, MD;  Location: [facility] INVASIVE CV LAB;  Service: Cardiovascular;  Laterality: N/A;     OB History  None      Home Medications    Prior to Admission medications   Medication Sig Start Date End Date Taking? Authorizing Provider  aspirin 81 MG chewable tablet Chew 1 tablet (81 mg total) by mouth daily. 10/25/17  Yes Georgie Chard D, NP  atorvastatin (LIPITOR) 80 MG tablet Take 1 tablet (80 mg total) by mouth daily. 11/22/17  Yes Julieanne Manson, MD  benzonatate (TESSALON) 100 MG capsule 1 cap by mouth  every 8 hours as needed for cough. 05/22/18  Yes Julieanne Manson, MD  carvedilol (COREG) 6.25 MG tablet Take 1 tablet (6.25 mg total) by mouth 2 (two) times daily with a meal. 12/01/17  Yes Barrett, Joline Salt, PA-C  famotidine (PEPCID) 20 MG tablet 2 tabs by mouth at bedtime. 02/28/18  Yes Julieanne Manson, MD  Ferrous Gluconate 324 (37.5 Fe) MG TABS 1 tab by mouth twice daily with half and orange 11/25/17  Yes Julieanne Manson, MD  fluticasone [facility]) 50 MCG/ACT nasal spray Place 2 sprays into both nostrils daily. 05/22/18  Yes Julieanne Manson, MD  furosemide (LASIX) 40 MG tablet 1 tab by mouth daily in morning 05/30/18  Yes Julieanne Manson, MD  glimepiride (AMARYL) 2 MG tablet Take 1 tablet (2 mg total) by mouth daily with breakfast. 03/27/18  Yes Julieanne Manson, MD  isosorbide mononitrate (IMDUR) 30 MG 24 hr tablet Take 1 tablet (30 mg total) by mouth daily. Patient taking differently: Take 15 mg by mouth 2 (two) times daily.  12/01/17  Yes Barrett, Joline Salt, PA-C  loratadine (CLARITIN) 10 MG tablet Take 1 tablet (10 mg total) by mouth daily. Patient taking differently: Take 10 mg by mouth daily as needed for allergies.  10/25/17  Yes Georgie Chard D, NP  nitroGLYCERIN (NITROSTAT) 0.4 MG SL tablet Place 1 tablet (0.4 mg total) under the tongue every 5 (five) minutes x 3 doses as needed for chest pain. 10/24/17  Yes Georgie Chard D, NP  sertraline (ZOLOFT) 50 MG tablet Take 1 tablet (50 mg total) by mouth at bedtime. Patient taking differently: Take 25 mg by mouth at bedtime.  01/04/18  Yes Julieanne Manson, MD  ticagrelor (BRILINTA) 90 MG TABS tablet Take 1 tablet (90 mg total) by mouth 2 (two) times daily. 01/20/18  Yes Lennette Bihari, MD  sitaGLIPtin (JANUVIA) 100 MG tablet 1/2 tab by mouth once daily with morning meal. Patient not taking: Reported on 04/19/2018 12/13/17   Julieanne Manson, MD    Family History Family History  Problem Relation Age of Onset  . Alcohol  abuse Father     Social History Social History   Tobacco Use  . Smoking status: Never Smoker  . Smokeless tobacco: Never Used  Substance Use Topics  . Alcohol use: Never    Frequency: Never  . Drug use: Never     Allergies   Patient has no known allergies.   Review of Systems Review of Systems  Constitutional: Positive for fatigue. Negative for chills and fever.  HENT: Negative for congestion and rhinorrhea.   Eyes: Negative for visual disturbance.  Respiratory: Positive for cough and shortness of breath. Negative for wheezing.   Cardiovascular: Positive for leg swelling. Negative for chest pain.  Gastrointestinal: Negative for abdominal pain, diarrhea, nausea and vomiting.  Genitourinary: Negative for dysuria and flank pain.  Musculoskeletal: Negative for neck pain and neck stiffness.  Skin: Negative for rash and wound.  Allergic/Immunologic: Negative for immunocompromised state.  Neurological: Positive for weakness. Negative for syncope and headaches.  All other systems reviewed and are negative.    Physical Exam Updated Vital Signs BP (!) 155/94 (BP Location: Right Arm)   Pulse 75   Temp 98.7 F (37.1 C) (Oral)   Resp 16   SpO2 100%   Physical Exam  Constitutional: She is oriented to person, place, and time. She appears well-developed and well-nourished. No distress.  HENT:  Head: Normocephalic and atraumatic.  Eyes: Conjunctivae are normal.  Neck: Neck supple.  Cardiovascular: Normal rate, regular rhythm and normal heart sounds. Exam reveals no friction rub.  No murmur heard. Pulmonary/Chest: She has no wheezes. She has rales.  Tachypnea, frequent coughing  Abdominal: Soft. She exhibits no distension.  Musculoskeletal: She exhibits edema (2+, pitting, bilateral).  Neurological: She is alert and oriented to person, place, and time. She exhibits normal muscle tone.  Skin: Skin is warm. Capillary refill takes less than 2 seconds.  Psychiatric: She has a  normal mood and affect.  Nursing note and vitals reviewed.    ED Treatments / Results  Labs (all labs ordered are listed, but only abnormal results are displayed) Labs Reviewed  BASIC METABOLIC PANEL - Abnormal; Notable for the following components:      Result Value   Sodium 127 (*)    Chloride 93 (*)    Glucose, Bld 122 (*)    Creatinine, Ser 1.13 (*)    Calcium 8.5 (*)    GFR calc non Af Amer 46 (*)    GFR calc Af Amer 54 (*)    All other components within normal limits  CBC - Abnormal; Notable for the following components:   Hemoglobin 11.5 (*)    RDW 17.0 (*)    All other components within normal limits  BRAIN NATRIURETIC PEPTIDE - Abnormal; Notable for the following components:   B Natriuretic Peptide 3,318.2 (*)    All other components within normal limits  I-STAT TROPONIN, ED    EKG EKG Interpretation  Date/Time:  Thursday June 01 2018 01:44:52 EST Ventricular Rate:  75 PR Interval:    QRS Duration: 92 QT Interval:  397 QTC Calculation: 444 R Axis:   -87 Text Interpretation:  Sinus rhythm Left atrial enlargement Left ventricular hypertrophy Consider inferior infarct Lateral infarct, age indeterminate No significant change since last tracing Confirmed by Shaune PollackIsaacs, Bronc Brosseau [phone]) on 06/01/2018 1:47:40 AM   Radiology Dg Chest 2 View  Result Date: 05/31/2018 CLINICAL DATA:  Bilateral leg swelling.  Shortness of breath, cough EXAM: CHEST - 2 VIEW COMPARISON:  12/23/2017 FINDINGS: Cardiomegaly. Small bilateral pleural effusions with bibasilar atelectasis. Mild vascular congestion. No overt edema. IMPRESSION: Cardiomegaly with vascular congestion. Small effusions with bibasilar atelectasis. Electronically Signed   By: Charlett NoseKevin  Dover M.D.   On: 05/31/2018 23:15    Procedures Procedures (including critical care time)  Medications Ordered in ED Medications  furosemide (LASIX) injection 40 mg (40 mg Intravenous Given 06/01/18 0153)     Initial Impression /  Assessment and Plan / ED Course  I have reviewed the triage vital signs and the nursing notes.  Pertinent labs & imaging results that were available during my care of the patient were reviewed by me and considered in my medical decision making (see chart for details).     77 year old female with history of heart failure with reduced ejection fraction here with acute on chronic CHF exacerbation.  Unclear trigger but I suspect it could be due to dietary discretion during holidays.  Patient has been taking her Lasix.  Denies any  chest pain, EKG is nonischemic and troponin is negative.  Will give IV Lasix and admit.  Final Clinical Impressions(s) / ED Diagnoses   Final diagnoses:  Acute on chronic congestive heart failure, unspecified heart failure type [facility])    ED Discharge Orders    None       Shaune Pollack, MD 06/01/18 [id number]

## 2018-06-01 NOTE — ED Notes (Signed)
Translator service used for med pass

## 2018-06-01 NOTE — ED Notes (Signed)
Pt received breakfast tray 

## 2018-06-01 NOTE — Plan of Care (Signed)

## 2018-06-01 NOTE — H&P (Signed)
History and Physical    Sheila White Amunique Neyra ZOX:[id number] DOB: 12-12-1940 DOA: 05/31/2018  PCP: Julieanne Manson, MD   Patient coming from: Home   Chief Complaint:  Dyspnea and lower extremity edema.   HPI: Sheila White is a 77 y.o. female with medical history significant of systolic heart failure, coronary artery disease, type 2 diabetes mellitus, anxiety and depression.  Patient reports 7 days of worsening dyspnea, moderate to severe in intensity. It has been associated with lower extremity edema.  Worse with exertion, no improving factors.  Denies any angina or PND.  Usually she sleeps with 2 pillows in her back, and it has not changed for the last 7 days.  She was evaluated by her primary care physician who increase the dose of furosemide about a week ago but her symptoms have been persistent.  At baseline her physical activity is limited but has significantly decreased over the last 48 hours.  Her family member at bedside also has noticed weight gain, increased abdominal girth, and persistent dry cough. No recent change in her medications.  ED Course: Patient was noted to be hypervolemic, and dyspneic, received 40 mg of intravenous furosemide and was referred for admission for evaluation.  Review of Systems:  1. General: No fevers, no chills, positive weight gain. 2. ENT: No runny nose or sore throat, no hearing disturbances 3. Pulmonary: Positive dyspnea, dry cough, no wheezing, or hemoptysis 4. Cardiovascular: No angina, claudication, positive lower extremity edema, but no pnd or orthopnea 5. Gastrointestinal: No nausea or vomiting, no diarrhea or constipation 6. Hematology: Positive easy bruisability, no frequent infections 7. Urology: No dysuria, hematuria or increased urinary frequency 8. Dermatology: No rashes. 9. Neurology: No seizures or paresthesias 10. Musculoskeletal: No joint pain or deformities  Past Medical History:  Diagnosis Date  . Acute  combined systolic and diastolic CHF, NYHA class 3 (HCC) 10/10/2017   in setting of MI  . Acute cystitis without hematuria   . Acute on chronic systolic congestive heart failure (HCC) 10/20/2017  . Anxiety and depression 12/19/2017  . Chest pain 10/31/2017  . Chest pain, rule out acute myocardial infarction 11/19/2017  . Coronary artery disease 10/31/2017  . Dyspnea 10/05/2017  . Heart failure (HCC) 12/23/2017  . HOH (hard of hearing)   . Ischemic cardiomyopathy 10/05/2017  . MI, acute, non ST segment elevation (HCC)   . Non-insulin dependent type 2 diabetes mellitus (HCC) 2004  . 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 STEMI involving left anterior descending coronary artery (HCC) 10/05/2017    Past Surgical History:  Procedure Laterality Date  . CORONARY/GRAFT ACUTE MI REVASCULARIZATION N/A 10/05/2017   Procedure: Coronary/Graft Acute MI Revascularization;  Surgeon: Lennette Bihari, MD;  Location: [facility] INVASIVE CV LAB;  Service: Cardiovascular;  Laterality: N/A;  . LEFT HEART CATH AND CORONARY ANGIOGRAPHY N/A 10/05/2017   Procedure: LEFT HEART CATH AND CORONARY ANGIOGRAPHY;  Surgeon: Lennette Bihari, MD;  Location: [facility] INVASIVE CV LAB;  Service: Cardiovascular;  Laterality: N/A;     reports that she has never smoked. She has never used smokeless tobacco. She reports that she does not drink alcohol or use drugs.  No Known Allergies  Family History  Problem Relation Age of Onset  . Alcohol abuse Father      Prior to Admission medications   Medication Sig Start Date End Date Taking? Authorizing Provider  aspirin 81 MG chewable tablet Chew 1 tablet (81 mg total) by mouth daily. 10/25/17  Yes Julien Girt,  Deri Fuelling, NP  atorvastatin (LIPITOR) 80 MG tablet Take 1 tablet (80 mg total) by mouth daily. 11/22/17  Yes Julieanne Manson, MD  benzonatate (TESSALON) 100 MG capsule 1 cap by mouth every 8 hours as needed for cough. 05/22/18  Yes Julieanne Manson, MD  carvedilol (COREG) 6.25 MG  tablet Take 1 tablet (6.25 mg total) by mouth 2 (two) times daily with a meal. 12/01/17  Yes Barrett, Joline Salt, PA-C  famotidine (PEPCID) 20 MG tablet 2 tabs by mouth at bedtime. 02/28/18  Yes Julieanne Manson, MD  Ferrous Gluconate 324 (37.5 Fe) MG TABS 1 tab by mouth twice daily with half and orange 11/25/17  Yes Julieanne Manson, MD  fluticasone [facility]) 50 MCG/ACT nasal spray Place 2 sprays into both nostrils daily. 05/22/18  Yes Julieanne Manson, MD  furosemide (LASIX) 40 MG tablet 1 tab by mouth daily in morning 05/30/18  Yes Julieanne Manson, MD  glimepiride (AMARYL) 2 MG tablet Take 1 tablet (2 mg total) by mouth daily with breakfast. 03/27/18  Yes Julieanne Manson, MD  isosorbide mononitrate (IMDUR) 30 MG 24 hr tablet Take 1 tablet (30 mg total) by mouth daily. Patient taking differently: Take 15 mg by mouth 2 (two) times daily.  12/01/17  Yes Barrett, Joline Salt, PA-C  loratadine (CLARITIN) 10 MG tablet Take 1 tablet (10 mg total) by mouth daily. Patient taking differently: Take 10 mg by mouth daily as needed for allergies.  10/25/17  Yes Georgie Chard D, NP  nitroGLYCERIN (NITROSTAT) 0.4 MG SL tablet Place 1 tablet (0.4 mg total) under the tongue every 5 (five) minutes x 3 doses as needed for chest pain. 10/24/17  Yes Georgie Chard D, NP  sertraline (ZOLOFT) 50 MG tablet Take 1 tablet (50 mg total) by mouth at bedtime. Patient taking differently: Take 25 mg by mouth at bedtime.  01/04/18  Yes Julieanne Manson, MD  ticagrelor (BRILINTA) 90 MG TABS tablet Take 1 tablet (90 mg total) by mouth 2 (two) times daily. 01/20/18  Yes Lennette Bihari, MD  sitaGLIPtin (JANUVIA) 100 MG tablet 1/2 tab by mouth once daily with morning meal. Patient not taking: Reported on 04/19/2018 12/13/17   Julieanne Manson, MD    Physical Exam: Vitals:   06/01/18 0334 06/01/18 0400 06/01/18 0530 06/01/18 0615  BP: (!) 157/78 (!) 148/83 (!) 161/94 (!) 164/90  Pulse: 70 66 78 81  Resp: 18 17 (!) 24 18    Temp:      TempSrc:      SpO2: 98% 98% 100% 100%    Vitals:   06/01/18 0334 06/01/18 0400 06/01/18 0530 06/01/18 0615  BP: (!) 157/78 (!) 148/83 (!) 161/94 (!) 164/90  Pulse: 70 66 78 81  Resp: 18 17 (!) 24 18  Temp:      TempSrc:      SpO2: 98% 98% 100% 100%   General: deconditioned and ill looking appearing. Neurology: Awake and alert, non focal Head and Neck. Head normocephalic. Neck supple with no adenopathy or thyromegaly.   E ENT: mild pallor, no icterus, oral mucosa dry.  Cardiovascular: Positive JVD. S1-S2 present, rhythmic, no gallops, rubs, or murmurs. +++ pitting lower extremity edema. Pulmonary: decreased breath sounds bilaterally, decreased air movement, no wheezing or rhonchi, but positive rales. Gastrointestinal. Abdomen with no organomegaly, no rebound or guarding, mild tender to palpation in the right upper quadrant.  Skin. No rashes Musculoskeletal: no joint deformities    Labs on Admission: I have personally reviewed following labs and imaging studies  CBC: Recent Labs  Lab 05/30/18 1416 05/31/18 2002  WBC 8.1 7.4  NEUTROABS 6.2  --   HGB 11.9 11.5*  HCT 36.4 36.6  MCV 85 85.7  PLT 275 264   Basic Metabolic Panel: Recent Labs  Lab 05/30/18 1416 05/31/18 2002  NA 125* 127*  K 5.3* 4.1  CL 90* 93*  CO2 22 24  GLUCOSE 150* 122*  BUN 18 23  CREATININE 1.22* 1.13*  CALCIUM 8.7 8.5*   GFR: Estimated Creatinine Clearance: 29.4 mL/min (A) (by C-G formula based on SCr of 1.13 mg/dL (H)). Liver Function Tests: Recent Labs  Lab 05/30/18 1416  AST 36  ALT 45*  ALKPHOS 90  BILITOT 0.5  PROT 5.8*  ALBUMIN 3.6   No results for input(s): LIPASE, AMYLASE in the last 168 hours. No results for input(s): AMMONIA in the last 168 hours. Coagulation Profile: No results for input(s): INR, PROTIME in the last 168 hours. Cardiac Enzymes: No results for input(s): CKTOTAL, CKMB, CKMBINDEX, TROPONINI in the last 168 hours. BNP (last 3 results) Recent  Labs    12/01/17 1041  PROBNP 20,354*   HbA1C: No results for input(s): HGBA1C in the last 72 hours. CBG: No results for input(s): GLUCAP in the last 168 hours. Lipid Profile: No results for input(s): CHOL, HDL, LDLCALC, TRIG, CHOLHDL, LDLDIRECT in the last 72 hours. Thyroid Function Tests: No results for input(s): TSH, T4TOTAL, FREET4, T3FREE, THYROIDAB in the last 72 hours. Anemia Panel: No results for input(s): VITAMINB12, FOLATE, FERRITIN, TIBC, IRON, RETICCTPCT in the last 72 hours. Urine analysis:    Component Value Date/Time   COLORURINE YELLOW 12/23/2017 0816   APPEARANCEUR HAZY (A) 12/23/2017 0816   LABSPEC 1.010 12/23/2017 0816   PHURINE 6.0 12/23/2017 0816   GLUCOSEU NEGATIVE 12/23/2017 0816   HGBUR NEGATIVE 12/23/2017 0816   BILIRUBINUR neg 01/04/2018 1157   KETONESUR NEGATIVE 12/23/2017 0816   PROTEINUR Positive (A) 01/04/2018 1157   PROTEINUR NEGATIVE 12/23/2017 0816   UROBILINOGEN 0.2 01/04/2018 1157   NITRITE neg 01/04/2018 1157   NITRITE NEGATIVE 12/23/2017 0816   LEUKOCYTESUR Small (1+) (A) 01/04/2018 1157    Radiological Exams on Admission: Dg Chest 2 View  Result Date: 05/31/2018 CLINICAL DATA:  Bilateral leg swelling.  Shortness of breath, cough EXAM: CHEST - 2 VIEW COMPARISON:  12/23/2017 FINDINGS: Cardiomegaly. Small bilateral pleural effusions with bibasilar atelectasis. Mild vascular congestion. No overt edema. IMPRESSION: Cardiomegaly with vascular congestion. Small effusions with bibasilar atelectasis. Electronically Signed   By: Charlett Nose M.D.   On: 05/31/2018 23:15    EKG: Independently reviewed.  Sinus rhythm, right axis deviation, poor R wave progression, J-point elevation in the precordial leads V3, V4 and V5.  Assessment/Plan Active Problems:   Heart failure (HCC)   Acute on chronic systolic CHF (congestive heart failure) (HCC)  77 year old female who has diagnosis of systolic heart failure who presents with worsening symptoms over  the last 7 days consistent with dyspnea, cough and lower extremity edema that have been refractive to furosemide as an outpatient.  On the initial physical examination blood pressure 164/90, heart rate 81, respiratory rate 18, oxygen saturation 100% on supplemental oxygen per nasal cannula, neck had positive JVD, her lungs had decreased breath sounds bilaterally with positive rales, heart S1-S2 present and rhythmic, abdomen distended, tender right upper quadrant, positive 3+ pitting bilateral lower extremity edema.  Sodium 127, potassium 4.1, chloride 93, bicarb 24, glucose 122, BUN 23, creatinine 1.13, BNP 3318, white count 7.4, hemoglobin 10.5, hematocrit 36.6,  platelets 264, chest radiograph with cardiomegaly, increased interstitial markings bilaterally at bases with cephalization of the vasculature and small bilateral pleural effusions.   Patient will be admitted to the hospital with a working diagnosis of acute on chronic systolic heart failure decompensation.   1. Acute on chronic systolic heart failure exacerbation/ ischemic cardiomyopathy with left ventricular ejection fraction 30 to 35%.  Patient will be admitted to the medical ward, she will be placed on a remote telemetry monitor, continue aggressive diuresis with furosemide, 40 mg intravenously twice daily to target negative fluid balance.  Last echocardiography from May 2019 with left ventricle ejection fraction 30 to 35% (old records personally reviewed), mid to distal anterior, apical and inferior apical severe hypokinesis.  Continue heart failure management with carvedilol 6.25 mg twice daily, and isosorbide 30 mg daily, hold on ACE inhibitors or ARB due to risk of worsening kidney function.  Repeat echocardiography on this admission, to reassess her LV function.  She will benefit from further medical therapy like aldosterone blockade, ACE inhibitors/ARB, or Entresto once more stable.   2.  Hypertension.  Continue blood pressure control with  carvedilol and isosorbide.  Aggressive diuresis with intravenous furosemide.  3.  Coronary artery disease.  Currently chest pain-free, continue atorvastatin, dual antiplatelet therapy with aspirin and ticagrelor.  She had an ST elevation myocardial infarction April 2019, due to LAD stenosis, then she underwent successful PCI to the LAD with a drug-eluting stent.  Her hospitalization was complicated by postinfarction pericarditis and acute kidney injury.  4.  Type 2 diabetes mellitus.  We will hold on oral hypoglycemic agents, continue glucose coverage and monitoring with insulin sliding scale.  5.  Depression.  Continue sertraline.  6. CKD stage 3 with Hyponatremia. Likely due to hypervolemia, will continue aggressive diuresis with IV furosemide, follow up on renal panel in am.   DVT prophylaxis:  Enoxaparin  Code Status: full  Family Communication: I spoke with patient's family at the bedside and all questions were addressed.   Disposition Plan: telemetry   Consults called: none  Admission status:  Inpatient    Jolina Symonds Annett Gulaaniel Annslee Tercero MD [facility] Pager [phone]- [phone]  If 7PM-7AM, please contact night-coverage [email or web address] Password [email or web address]  06/01/2018, 8:05 AM

## 2018-06-01 NOTE — ED Notes (Signed)
BREAKFAST ORDERED  

## 2018-06-01 NOTE — Evaluation (Signed)
Physical Therapy Evaluation Patient Details Name: Sheila HuntsmanMaria Santos Umana De White MRN: [id number] DOB: Nov 16, 1940 Today's Date: 06/01/2018   History of Present Illness  Pt is a 77 y.o. F with significant PMH of systolic heart failure, CAD, type 2 diabetes mellitus, anxiety and depression. Reports 7 days of worsening dyspnea and lower extremity edema.  Clinical Impression  Patient admitted with above diagnosis. Seems fairly close to functional baseline per pt and pt daughter report. Prior to admission, patient independent with household ambulation and ADL's. Currently ambulating 75 feet with walker and supervision. HR 60s, SpO2 92-97% on RA. Will follow acutely to progress mobility but no PT follow up/equipment recommended.    Follow Up Recommendations No PT follow up    Equipment Recommendations  None recommended by PT    Recommendations for Other Services       Precautions / Restrictions Precautions Precautions: Fall Restrictions Weight Bearing Restrictions: No      Mobility  Bed Mobility Overal bed mobility: Modified Independent                Transfers Overall transfer level: Needs assistance Equipment used: None Transfers: Sit to/from Stand Sit to Stand: Min guard         General transfer comment: Mild posterior lean initially but pt able to correct  Ambulation/Gait Ambulation/Gait assistance: Supervision Gait Distance (Feet): 75 Feet Assistive device: Rolling walker (2 wheeled) Gait Pattern/deviations: Step-through pattern;Decreased stride length Gait velocity: decreased   General Gait Details: Pt lightly utilizing walker with no overt LOB  Stairs            Wheelchair Mobility    Modified Rankin (Stroke Patients Only)       Balance Overall balance assessment: Mild deficits observed, not formally tested                                           Pertinent Vitals/Pain Pain Assessment: No/denies pain    Home Living  Family/patient expects to be discharged to:: Private residence Living Arrangements: Children Available Help at Discharge: Family;Available 24 hours/day Type of Home: House Home Access: Level entry     Home Layout: One level Home Equipment: Walker - 2 wheels;Bedside commode Additional Comments: lives with daughter    Prior Function Level of Independence: Independent         Comments: Independent with ADL's and ambulation. Pt daughter reports she enjoys doing household chores     Higher education careers adviserHand Dominance        Extremity/Trunk Assessment   Upper Extremity Assessment Upper Extremity Assessment: Overall WFL for tasks assessed    Lower Extremity Assessment Lower Extremity Assessment: Overall WFL for tasks assessed       Communication   Communication: Prefers language other than English(Spanish)  Cognition Arousal/Alertness: Awake/alert Behavior During Therapy: WFL for tasks assessed/performed Overall Cognitive Status: Within Functional Limits for tasks assessed                                        General Comments General comments (skin integrity, edema, etc.): No interpreter resource available due to late PM evaluation; daughter agreeable to provide translation    Exercises     Assessment/Plan    PT Assessment Patient needs continued PT services  PT Problem List Decreased activity tolerance;Decreased balance;Decreased mobility  PT Treatment Interventions DME instruction;Gait training;Stair training;Functional mobility training;Therapeutic activities;Therapeutic exercise;Balance training;Patient/family education    PT Goals (Current goals can be found in the Care Plan section)  Acute Rehab PT Goals Patient Stated Goal: independence PT Goal Formulation: With patient/family Time For Goal Achievement: 06/15/18 Potential to Achieve Goals: Good    Frequency Min 3X/week   Barriers to discharge        Co-evaluation               AM-PAC  PT "6 Clicks" Mobility  Outcome Measure Help needed turning from your back to your side while in a flat bed without using bedrails?: None Help needed moving from lying on your back to sitting on the side of a flat bed without using bedrails?: None Help needed moving to and from a bed to a chair (including a wheelchair)?: None Help needed standing up from a chair using your arms (e.g., wheelchair or bedside chair)?: A Little Help needed to walk in hospital room?: A Little Help needed climbing 3-5 steps with a railing? : A Little 6 Click Score: 21    End of Session   Activity Tolerance: Patient tolerated treatment well Patient left: in bed;with call bell/phone within reach;with family/visitor present Nurse Communication: Mobility status PT Visit Diagnosis: Unsteadiness on feet (R26.81);Difficulty in walking, not elsewhere classified (R26.2)    Time: 8657-8469 PT Time Calculation (min) (ACUTE ONLY): 8 min   Charges:   PT Evaluation $PT Eval Moderate Complexity: 1 Mod          Laurina Bustle, [place], DPT Acute Rehabilitation Services Pager [phone] Office [phone]   Vanetta Mulders 06/01/2018, 5:29 PM

## 2018-06-02 ENCOUNTER — Other Ambulatory Visit (HOSPITAL_COMMUNITY): Payer: Self-pay

## 2018-06-02 ENCOUNTER — Ambulatory Visit: Payer: Self-pay | Admitting: Internal Medicine

## 2018-06-02 DIAGNOSIS — N183 Chronic kidney disease, stage 3 unspecified: Secondary | ICD-10-CM

## 2018-06-02 DIAGNOSIS — E1136 Type 2 diabetes mellitus with diabetic cataract: Secondary | ICD-10-CM | POA: Diagnosis present

## 2018-06-02 DIAGNOSIS — I959 Hypotension, unspecified: Secondary | ICD-10-CM | POA: Diagnosis present

## 2018-06-02 DIAGNOSIS — F329 Major depressive disorder, single episode, unspecified: Secondary | ICD-10-CM

## 2018-06-02 DIAGNOSIS — I130 Hypertensive heart and chronic kidney disease with heart failure and stage 1 through stage 4 chronic kidney disease, or unspecified chronic kidney disease: Secondary | ICD-10-CM | POA: Diagnosis present

## 2018-06-02 DIAGNOSIS — I2510 Atherosclerotic heart disease of native coronary artery without angina pectoris: Secondary | ICD-10-CM | POA: Diagnosis present

## 2018-06-02 DIAGNOSIS — J988 Other specified respiratory disorders: Secondary | ICD-10-CM | POA: Clinically undetermined

## 2018-06-02 DIAGNOSIS — F419 Anxiety disorder, unspecified: Secondary | ICD-10-CM | POA: Diagnosis present

## 2018-06-02 DIAGNOSIS — H9190 Unspecified hearing loss, unspecified ear: Secondary | ICD-10-CM | POA: Diagnosis present

## 2018-06-02 DIAGNOSIS — I10 Essential (primary) hypertension: Secondary | ICD-10-CM

## 2018-06-02 DIAGNOSIS — E119 Type 2 diabetes mellitus without complications: Secondary | ICD-10-CM

## 2018-06-02 DIAGNOSIS — R0989 Other specified symptoms and signs involving the circulatory and respiratory systems: Secondary | ICD-10-CM | POA: Diagnosis present

## 2018-06-02 DIAGNOSIS — I5041 Acute combined systolic (congestive) and diastolic (congestive) heart failure: Secondary | ICD-10-CM

## 2018-06-02 DIAGNOSIS — I5043 Acute on chronic combined systolic (congestive) and diastolic (congestive) heart failure: Secondary | ICD-10-CM | POA: Diagnosis present

## 2018-06-02 DIAGNOSIS — Z955 Presence of coronary angioplasty implant and graft: Secondary | ICD-10-CM | POA: Diagnosis not present

## 2018-06-02 DIAGNOSIS — Z79899 Other long term (current) drug therapy: Secondary | ICD-10-CM | POA: Diagnosis not present

## 2018-06-02 DIAGNOSIS — Z7982 Long term (current) use of aspirin: Secondary | ICD-10-CM | POA: Diagnosis not present

## 2018-06-02 DIAGNOSIS — Z811 Family history of alcohol abuse and dependence: Secondary | ICD-10-CM | POA: Diagnosis not present

## 2018-06-02 DIAGNOSIS — I255 Ischemic cardiomyopathy: Secondary | ICD-10-CM | POA: Diagnosis present

## 2018-06-02 DIAGNOSIS — Z7902 Long term (current) use of antithrombotics/antiplatelets: Secondary | ICD-10-CM | POA: Diagnosis not present

## 2018-06-02 DIAGNOSIS — E871 Hypo-osmolality and hyponatremia: Secondary | ICD-10-CM | POA: Diagnosis present

## 2018-06-02 DIAGNOSIS — I252 Old myocardial infarction: Secondary | ICD-10-CM | POA: Diagnosis not present

## 2018-06-02 DIAGNOSIS — Z7984 Long term (current) use of oral hypoglycemic drugs: Secondary | ICD-10-CM | POA: Diagnosis not present

## 2018-06-02 DIAGNOSIS — E1122 Type 2 diabetes mellitus with diabetic chronic kidney disease: Secondary | ICD-10-CM | POA: Diagnosis present

## 2018-06-02 DIAGNOSIS — I509 Heart failure, unspecified: Secondary | ICD-10-CM | POA: Diagnosis present

## 2018-06-02 LAB — GLUCOSE, CAPILLARY
Glucose-Capillary: 115 mg/dL — ABNORMAL HIGH (ref 70–99)
Glucose-Capillary: 177 mg/dL — ABNORMAL HIGH (ref 70–99)
Glucose-Capillary: 197 mg/dL — ABNORMAL HIGH (ref 70–99)
Glucose-Capillary: 81 mg/dL (ref 70–99)

## 2018-06-02 LAB — CBC
HCT: 37.8 % (ref 36.0–46.0)
Hemoglobin: 11.8 g/dL — ABNORMAL LOW (ref 12.0–15.0)
MCH: 26.5 pg (ref 26.0–34.0)
MCHC: 31.2 g/dL (ref 30.0–36.0)
MCV: 84.8 fL (ref 80.0–100.0)
Platelets: 239 10*3/uL (ref 150–400)
RBC: 4.46 MIL/uL (ref 3.87–5.11)
RDW: 16.8 % — ABNORMAL HIGH (ref 11.5–15.5)
WBC: 6.9 10*3/uL (ref 4.0–10.5)
nRBC: 0 % (ref 0.0–0.2)

## 2018-06-02 LAB — BASIC METABOLIC PANEL
Anion gap: 13 (ref 5–15)
BUN: 22 mg/dL (ref 8–23)
CO2: 26 mmol/L (ref 22–32)
Calcium: 8.9 mg/dL (ref 8.9–10.3)
Chloride: 96 mmol/L — ABNORMAL LOW (ref 98–111)
Creatinine, Ser: 1.45 mg/dL — ABNORMAL HIGH (ref 0.44–1.00)
GFR calc Af Amer: 40 mL/min — ABNORMAL LOW (ref >60)
GFR, EST NON AFRICAN AMERICAN: 34 mL/min — AB (ref >60)
GLUCOSE: 120 mg/dL — AB (ref 70–99)
Potassium: 3.9 mmol/L (ref 3.5–5.1)
Sodium: 135 mmol/L (ref 135–145)

## 2018-06-02 MED ORDER — FLUTICASONE PROPIONATE 50 MCG/ACT NA SUSP
2.0000 | Freq: Every day | NASAL | Status: DC
  Administered 2018-06-02 – 2018-06-04 (×3): 2 via NASAL
  Filled 2018-06-02: qty 16

## 2018-06-02 MED ORDER — LORATADINE 10 MG PO TABS
10.0000 mg | ORAL_TABLET | Freq: Every day | ORAL | Status: DC
  Administered 2018-06-02 – 2018-06-04 (×3): 10 mg via ORAL
  Filled 2018-06-02 (×3): qty 1

## 2018-06-02 NOTE — Progress Notes (Signed)
PROGRESS NOTE    Sheila White  WJX:[id number] DOB: [birth year] DOA: 05/31/2018 PCP: Julieanne Manson, MD    Brief Narrative:  Sheila White Sheila White is a 77 y.o. female with medical history significant of systolic heart failure, coronary artery disease, type 2 diabetes mellitus, anxiety and depression.  Patient reports 7 days of worsening dyspnea, moderate to severe in intensity. It has been associated with lower extremity edema.  Worse with exertion, no improving factors.  Denies any angina or PND.  Usually she sleeps with 2 pillows in her back, and it has not changed for the last 7 days.  She was evaluated by her primary care physician who increase the dose of furosemide about a week ago but her symptoms have been persistent.  At baseline her physical activity is limited but has significantly decreased over the last 48 hours.  Her family member at bedside also has noticed weight gain, increased abdominal girth, and persistent dry cough. No recent change in her medications.  ED Course: Patient was noted to be hypervolemic, and dyspneic, received 40 mg of intravenous furosemide and was referred for admission for evaluation.   Assessment & Plan:   Principal Problem:   Acute on chronic systolic CHF (congestive heart failure) (HCC) Active Problems:   Acute combined systolic and diastolic CHF, NYHA class 3 (HCC)   Ischemic cardiomyopathy   Non-insulin dependent type 2 diabetes mellitus (HCC)   Depression   Heart failure (HCC)   Cataract of both eyes   Congestion of upper airway   Essential hypertension   CKD (chronic kidney disease) stage 3, GFR 30-59 ml/min (HCC)  #1 acute on chronic combined systolic diastolic heart failure exacerbation/ischemic cardiomyopathy with EF of 30 to 35% Patient had presented with an acute exacerbation of CHF with worsening shortness of breath, lower extremity edema, some abdominal distention.  Patient noted to have echo done in May 2019  which had a EF of 30 to 35% with mid to distal anterior, apical and inferior apical severe hypokinesis.  To have a complex cardiac history with recent cardiac catheterization revealing subtotal long proximal LAD stenosis of 99%, 90% mid stenosis, 99% apical stenosis with reduced apical flow.  Patient status post 50% proximal OM1 stenosis followed by 95% distal marginal stenosis with mild irregularity of RCA.  Patient status post PCI to LAD and DES stent.  Patient diuresing well with a urine output of 2.1 L over the past 24 hours.  Current weight is 51.6 kg from 52.3 kg on admission.  It is noted that last cardiology office visit on 03/27/2018 patient's weight was 48.4 kg.  Patient still volume overloaded on examination.  Continue Lasix 40 mg IV every 12 hours.  Continue current regimen of aspirin, Coreg, Lipitor, Imdur,brilinta.  Due to patient's complex cardiac history we will consult with cardiology for evaluation and management.  2.  Non-insulin-dependent type 2 diabetes mellitus Hemoglobin A1c was 7.2 on 04/19/2018.  CBG of 115 this morning.  Continue current regimen of sliding scale insulin.  Hold oral hypoglycemic agents.  3.  Congestion of upper airway Placed on Claritin and Flonase.  4.  Depression Stable.  Continue Zoloft.  5.  Hypertension Continue Coreg and Imdur.  Patient on IV Lasix.  6.  Chronic kidney disease stage III with hyponatremia Likely secondary to volume overload.  Sodium level has improved with diuresis.  Monitor with diuretics.  Follow.   DVT prophylaxis: Lovenox Code Status: Full Family Communication: Updated patient and family at  bedside. Disposition Plan: Likely home once euvolemic with clinical improvement.   Consultants:   None  Procedures:   Chest x-ray 05/31/2018  2D echo pending  Antimicrobials:   None   Subjective: Patient sitting up in chair at bedside.  States some improvement with shortness of breath since admission.  Complaining of upper  respiratory symptoms of congestion and nasal drainage.  Denies any chest pain.  Family at bedside.  Objective: Vitals:   06/02/18 0113 06/02/18 0124 06/02/18 0443 06/02/18 0840  BP: 139/73  (!) 143/86 (!) 152/77  Pulse: 67  77 73  Resp: 18  18 (!) 30  Temp: 98 F (36.7 C)  (!) 97.5 F (36.4 C) 98.6 F (37 C)  TempSrc: Oral  Oral Oral  SpO2: 98%  98% 99%  Weight:  51.6 kg    Height:        Intake/Output Summary (Last 24 hours) at 06/02/2018 1356 Last data filed at 06/02/2018 1021 Gross per 24 hour  Intake 340 ml  Output 2100 ml  Net -1760 ml   Filed Weights   06/01/18 1504 06/02/18 0124  Weight: 52.3 kg 51.6 kg    Examination:  General exam: Appears calm and comfortable  Respiratory system: Diffuse crackles.  No wheezing.  No rhonchi. Respiratory effort normal. Cardiovascular system: S1 & S2 heard, RRR. + JVD, murmurs, rubs, gallops or clicks.  Trace lower extremity edema.  Gastrointestinal system: Abdomen is nondistended, soft and nontender. No organomegaly or masses felt. Normal bowel sounds heard. Central nervous system: Alert and oriented. No focal neurological deficits. Extremities: Symmetric 5 x 5 power. Skin: No rashes, lesions or ulcers Psychiatry: Judgement and insight appear normal. Mood & affect appropriate.     Data Reviewed: I have personally reviewed following labs and imaging studies  CBC: Recent Labs  Lab 05/30/18 1416 05/31/18 2002 06/01/18 0835 06/02/18 0457  WBC 8.1 7.4 9.1 6.9  NEUTROABS 6.2  --   --   --   HGB 11.9 11.5* 11.9* 11.8*  HCT 36.4 36.6 37.3 37.8  MCV 85 85.7 85.4 84.8  PLT 275 264 235 239   Basic Metabolic Panel: Recent Labs  Lab 05/30/18 1416 05/31/18 2002 06/01/18 0835 06/02/18 0457  NA 125* 127*  --  135  K 5.3* 4.1  --  3.9  CL 90* 93*  --  96*  CO2 22 24  --  26  GLUCOSE 150* 122*  --  120*  BUN 18 23  --  22  CREATININE 1.22* 1.13* 1.27* 1.45*  CALCIUM 8.7 8.5*  --  8.9   GFR: Estimated Creatinine  Clearance: 21.5 mL/min (A) (by C-G formula based on SCr of 1.45 mg/dL (H)). Liver Function Tests: Recent Labs  Lab 05/30/18 1416  AST 36  ALT 45*  ALKPHOS 90  BILITOT 0.5  PROT 5.8*  ALBUMIN 3.6   No results for input(s): LIPASE, AMYLASE in the last 168 hours. No results for input(s): AMMONIA in the last 168 hours. Coagulation Profile: No results for input(s): INR, PROTIME in the last 168 hours. Cardiac Enzymes: No results for input(s): CKTOTAL, CKMB, CKMBINDEX, TROPONINI in the last 168 hours. BNP (last 3 results) Recent Labs    12/01/17 1041  PROBNP 20,354*   HbA1C: No results for input(s): HGBA1C in the last 72 hours. CBG: Recent Labs  Lab 06/01/18 1659 06/01/18 2054 06/02/18 0726 06/02/18 1140  GLUCAP 98 105* 115* 177*   Lipid Profile: No results for input(s): CHOL, HDL, LDLCALC, TRIG, CHOLHDL, LDLDIRECT in  the last 72 hours. Thyroid Function Tests: No results for input(s): TSH, T4TOTAL, FREET4, T3FREE, THYROIDAB in the last 72 hours. Anemia Panel: No results for input(s): VITAMINB12, FOLATE, FERRITIN, TIBC, IRON, RETICCTPCT in the last 72 hours. Sepsis Labs: No results for input(s): PROCALCITON, LATICACIDVEN in the last 168 hours.  No results found for this or any previous visit (from the past 240 hour(s)).       Radiology Studies: Dg Chest 2 View  Result Date: 05/31/2018 CLINICAL DATA:  Bilateral leg swelling.  Shortness of breath, cough EXAM: CHEST - 2 VIEW COMPARISON:  12/23/2017 FINDINGS: Cardiomegaly. Small bilateral pleural effusions with bibasilar atelectasis. Mild vascular congestion. No overt edema. IMPRESSION: Cardiomegaly with vascular congestion. Small effusions with bibasilar atelectasis. Electronically Signed   By: Charlett Nose M.D.   On: 05/31/2018 23:15        Scheduled Meds: . aspirin  81 mg Oral Daily  . atorvastatin  80 mg Oral Daily  . carvedilol  6.25 mg Oral BID WC  . enoxaparin (LOVENOX) injection  40 mg Subcutaneous Q24H  .  famotidine  20 mg Oral Daily  . fluticasone  2 spray Each Nare Daily  . furosemide  40 mg Intravenous Q12H  . insulin aspart  0-9 Units Subcutaneous TID WC  . isosorbide mononitrate  30 mg Oral Daily  . loratadine  10 mg Oral Daily  . sertraline  25 mg Oral QHS  . ticagrelor  90 mg Oral BID   Continuous Infusions:   LOS: 1 day    Time spent: 35 minutes    Ramiro Harvest, MD [facility] Pager [phone]  If 7PM-7AM, please contact night-coverage [email or web address] Password [email or web address] 06/02/2018, 1:56 PM

## 2018-06-02 NOTE — Consult Note (Addendum)
Cardiology Consultation:   Patient ID: Daylani Deblois; [id number]; [birth year]   Admit date: 05/31/2018 Date of Consult: 06/02/2018  Primary Care Provider: Julieanne Manson, MD Primary Cardiologist: Nicki Guadalajara, MD Primary Electrophysiologist:  None   Patient Profile:   [name] Mckenzie Bove is a 77 y.o. 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 Janee Morn.  History of Present Illness:   Ms. Ilyssa Grennan is a spanish speaking female. Family was not present at the time of this evaluation and attempts to use the bedside interpreter was non-productive. An in-person interpreter was not available at the time and daughter did not answer the phone so history primarily obtained from chart review. Per H&P, patient presented with worsening SOB and lower extremity edema for the past week. She was seen by her PCP 05/30/18 who increase her lasix from 20mg  daily to 60mg  daily but her symptoms did not improve, prompting her to present to the ED for further evaluation.   She was last seen outpatient by cardiology, Dr. Tresa Endo, 02/2018, at which time she was without anginal or volume overload complaints. She has been receiving brilinta samples from our office as she is a non-responder to plavix. Last ischemic evaluation was a LHC 09/2017 after presenting with a STEMI which showed subtotal long pLAD stenosis (managed with PCI/DES), 90% mLAD stenosis (managed with PCI/DES), and 99% apical LAD stenosis (managed with balloon angioplasty), and 50% stenosis of proximal OM1 with 95% distal disease medically managed. Last echo 10/2017 with EF 30-35%, G1DD, and LAD territory hypokinesis.   At the time of this evaluation she appears to be comfortable. Attempts to assess her current symptoms were unsuccessful as she was not  answering questions appropriately and appeared preoccupied with trying to find her glasses. Unclear what her baseline mental status is but not prior mention of dementia concerns on chart review.   Hospital course: hypertensive, otherwise VSS. Labs notable for Na 127>135, K 3.9, Cr 1.27>1.45, BNP 3318, Trop negative x1, Hgb 11.9, PLT 235. EKG with sinus rhythm with LAE and LVH pattern without STE/D, no significant change. CXR with vascular congestion. She was admitted to medicine and started on IV lasix 40mg  BID. Cardiology asked to evaluate.   Past Medical History:  Diagnosis Date  . Acute combined systolic and diastolic CHF, NYHA class 3 (HCC) 10/10/2017   in setting of MI  . Acute cystitis without hematuria   . Acute on chronic systolic congestive heart failure (HCC) 10/20/2017  . Anxiety and depression 12/19/2017  . Chest pain 10/31/2017  . Chest pain, rule out acute myocardial infarction 11/19/2017  . Coronary artery disease 10/31/2017  . Dyspnea 10/05/2017  . Heart failure (HCC) 12/23/2017  . HOH (hard of hearing)   . Ischemic cardiomyopathy 10/05/2017  . MI, acute, non ST segment elevation (HCC)   . Non-insulin dependent type 2 diabetes mellitus (HCC) 2004  . 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 STEMI involving left anterior descending coronary artery (HCC) 10/05/2017    Past Surgical History:  Procedure Laterality Date  . CORONARY/GRAFT ACUTE MI REVASCULARIZATION N/A 10/05/2017   Procedure: Coronary/Graft Acute MI Revascularization;  Surgeon: Lennette Bihari, MD;  Location: [facility] INVASIVE CV LAB;  Service: Cardiovascular;  Laterality: N/A;  . LEFT HEART CATH AND CORONARY ANGIOGRAPHY N/A 10/05/2017   Procedure: LEFT  HEART CATH AND CORONARY ANGIOGRAPHY;  Surgeon: Lennette Bihari, MD;  Location: [facility] INVASIVE CV LAB;  Service: Cardiovascular;  Laterality: N/A;     Home Medications:  Prior to Admission medications   Medication Sig Start Date End Date Taking? Authorizing Provider    aspirin 81 MG chewable tablet Chew 1 tablet (81 mg total) by mouth daily. 10/25/17  Yes Georgie Chard D, NP  atorvastatin (LIPITOR) 80 MG tablet Take 1 tablet (80 mg total) by mouth daily. 11/22/17  Yes Julieanne Manson, MD  benzonatate (TESSALON) 100 MG capsule 1 cap by mouth every 8 hours as needed for cough. 05/22/18  Yes Julieanne Manson, MD  carvedilol (COREG) 6.25 MG tablet Take 1 tablet (6.25 mg total) by mouth 2 (two) times daily with a meal. 12/01/17  Yes Barrett, Joline Salt, PA-C  famotidine (PEPCID) 20 MG tablet 2 tabs by mouth at bedtime. 02/28/18  Yes Julieanne Manson, MD  Ferrous Gluconate 324 (37.5 Fe) MG TABS 1 tab by mouth twice daily with half and orange 11/25/17  Yes Julieanne Manson, MD  fluticasone [facility]) 50 MCG/ACT nasal spray Place 2 sprays into both nostrils daily. 05/22/18  Yes Julieanne Manson, MD  furosemide (LASIX) 40 MG tablet 1 tab by mouth daily in morning 05/30/18  Yes Julieanne Manson, MD  glimepiride (AMARYL) 2 MG tablet Take 1 tablet (2 mg total) by mouth daily with breakfast. 03/27/18  Yes Julieanne Manson, MD  isosorbide mononitrate (IMDUR) 30 MG 24 hr tablet Take 1 tablet (30 mg total) by mouth daily. Patient taking differently: Take 15 mg by mouth 2 (two) times daily.  12/01/17  Yes Barrett, Joline Salt, PA-C  loratadine (CLARITIN) 10 MG tablet Take 1 tablet (10 mg total) by mouth daily. Patient taking differently: Take 10 mg by mouth daily as needed for allergies.  10/25/17  Yes Georgie Chard D, NP  nitroGLYCERIN (NITROSTAT) 0.4 MG SL tablet Place 1 tablet (0.4 mg total) under the tongue every 5 (five) minutes x 3 doses as needed for chest pain. 10/24/17  Yes Georgie Chard D, NP  sertraline (ZOLOFT) 50 MG tablet Take 1 tablet (50 mg total) by mouth at bedtime. Patient taking differently: Take 25 mg by mouth at bedtime.  01/04/18  Yes Julieanne Manson, MD  ticagrelor (BRILINTA) 90 MG TABS tablet Take 1 tablet (90 mg total) by mouth 2 (two) times  daily. 01/20/18  Yes Lennette Bihari, MD  sitaGLIPtin (JANUVIA) 100 MG tablet 1/2 tab by mouth once daily with morning meal. Patient not taking: Reported on 04/19/2018 12/13/17   Julieanne Manson, MD    Inpatient Medications: Scheduled Meds: . aspirin  81 mg Oral Daily  . atorvastatin  80 mg Oral Daily  . carvedilol  6.25 mg Oral BID WC  . enoxaparin (LOVENOX) injection  40 mg Subcutaneous Q24H  . famotidine  20 mg Oral Daily  . fluticasone  2 spray Each Nare Daily  . furosemide  40 mg Intravenous Q12H  . insulin aspart  0-9 Units Subcutaneous TID WC  . isosorbide mononitrate  30 mg Oral Daily  . loratadine  10 mg Oral Daily  . sertraline  25 mg Oral QHS  . ticagrelor  90 mg Oral BID   Continuous Infusions:  PRN Meds: acetaminophen **OR** acetaminophen, benzonatate, nitroGLYCERIN, ondansetron **OR** ondansetron (ZOFRAN) IV  Allergies:   No Known Allergies  Social History:   Social History   Socioeconomic History  . Marital status: Widowed    Spouse name: Not on file  .  Number of children: 8  . Years of education: 37  . Highest education level: Not on file  Occupational History  . Not on file  Social Needs  . Financial resource strain: Not on file  . Food insecurity:    Worry: Not on file    Inability: Not on file  . Transportation needs:    Medical: Not on file    Non-medical: Not on file  Tobacco Use  . Smoking status: Never Smoker  . Smokeless tobacco: Never Used  Substance and Sexual Activity  . Alcohol use: Never    Frequency: Never  . Drug use: Never  . Sexual activity: Not Currently  Lifestyle  . Physical activity:    Days per week: Not on file    Minutes per session: Not on file  . Stress: Not on file  Relationships  . Social connections:    Talks on phone: Not on file    Gets together: Not on file    Attends religious service: Not on file    Active member of club or organization: Not on file    Attends meetings of clubs or organizations: Not on  file    Relationship status: Not on file  . Intimate partner violence:    Fear of current or ex partner: Not on file    Emotionally abused: Not on file    Physically abused: Not on file    Forced sexual activity: Not on file  Other Topics Concern  . Not on file  Social History Narrative   Living with daughter, Wray Kearns and her husband and her 3 sons.    Family History:    Family History  Problem Relation Age of Onset  . Alcohol abuse Father      ROS:  Please see the history of present illness.   All other ROS reviewed and negative.     Physical Exam/Data:   Vitals:   06/02/18 0113 06/02/18 0124 06/02/18 0443 06/02/18 0840  BP: 139/73  (!) 143/86 (!) 152/77  Pulse: 67  77 73  Resp: 18  18 (!) 30  Temp: 98 F (36.7 C)  (!) 97.5 F (36.4 C) 98.6 F (37 C)  TempSrc: Oral  Oral Oral  SpO2: 98%  98% 99%  Weight:  51.6 kg    Height:        Intake/Output Summary (Last 24 hours) at 06/02/2018 1318 Last data filed at 06/02/2018 1021 Gross per 24 hour  Intake 340 ml  Output 2100 ml  Net -1760 ml   Filed Weights   06/01/18 1504 06/02/18 0124  Weight: 52.3 kg 51.6 kg   Body mass index is 22.96 kg/m.  General:  Thin female sitting on the edge of her hospital bed in no acute distress HEENT: sclera anicteric  Neck: no JVD Vascular: No carotid bruits; distal pulses 2+ bilaterally Cardiac:  normal S1, S2; RRR; no murmurs, rubs, or gallops Lungs:  clear to auscultation bilaterally, no wheezing, rhonchi or rales  Abd: NABS, soft, nontender, no hepatomegaly Ext: no edema Musculoskeletal:  No deformities, BUE and BLE strength normal and equal Skin: warm and dry  Neuro:  CNs 2-12 intact, no focal abnormalities noted Psych:  Difficult to assess   EKG:  The EKG was personally reviewed and demonstrates:  Sinus rhythm with LAE and LVH pattern, no STE/D, no TWI - no significant change from previous Telemetry:  Telemetry was personally reviewed and demonstrates:  NSR  Relevant  CV Studies: Left heart catheterization  09/2017:  Mid LAD lesion is 90% stenosed.  Prox LAD lesion is 99% stenosed.  Dist LAD lesion is 99% stenosed.  1st Mrg lesion is 95% stenosed.  Ost 2nd Mrg to 2nd Mrg lesion is 30% stenosed.  Ost RPDA to RPDA lesion is 30% stenosed.  Post intervention, there is a 0% residual stenosis.  Post intervention, there is a 0% residual stenosis.  Post intervention, there is a 35% residual stenosis.  A stent was successfully placed.  A stent was successfully placed.   Acute late presentation anterior ST segment elevation myocardial infarction with demonstration of subtotal long proximal LAD stenosis, 90% mid stenosis, and 99% apical stenosis with reduced apical flow.  Left circumflex disease with 50% proximal OM1 stenosis followed by distal 95% marginal stenosis prior to giving off distal branches in the OM1 vessel.  Mild diffuse irregularity of a large dominant RCA with 30% narrowing in the PDA vessel.  LVEDP 34 mmHg/  Successful PCI to the LAD with ultimate insertion of a 2.526 mm Resolute DES stent postdilated to 2.8 mm proximally and 2.68 mm distally with the 99% stenosis being reduced to 0%; DES stenting of the mid 90% stenosis with ultimate insertion of a 2.2512 mm Resolute DES stent with residual narrowing at 0%, and initial 99% apical thrombotic stenosis with reduced flow treated with low-level PTCA and intracoronary verapamil with improvement to approximately 35-40% in a very small apical LAD segment.  RECOMMENDATION: DAPT for minimum of 1 year.  Diuresis with elevated EDP.  An echo Doppler study will be obtained in a.m.  Patient should be started on low-dose ACE inhibition, carvedilol, probable nitrate therapy, and high potency statin treatment.  Echocardiogram 10/2017: Study Conclusions  - Left ventricle: The cavity size was normal. Wall thickness was   increased in a pattern of mild LVH. Systolic function was   moderately to  severely reduced. The estimated ejection fraction   was in the range of 30% to 35%. Mid to distal anterior, apical   and inferoapical severe hypokinesis, suggestive of LAD territory   infarct. Doppler parameters are consistent with abnormal left   ventricular relaxation (grade 1 diastolic dysfunction). The E/e&'   ratio is between 8-15, suggesting indeterminate LV filling   pressure. - Aortic valve: Sclerosis without stenosis. There was trivial   regurgitation. - Mitral valve: Mildly thickened leaflets . There was trivial   regurgitation. - Left atrium: The atrium was mildly dilated. - Inferior vena cava: The vessel was normal in size. The   respirophasic diameter changes were in the normal range (>= 50%),   consistent with normal central venous pressure.  Impressions:  - Compared to a prior study in 09/2017, the LVEF and WMA&'s are   unchanged. LV filling pressure has improved.   Laboratory Data:  Chemistry Recent Labs  Lab 05/30/18 1416 05/31/18 2002 06/01/18 0835 06/02/18 0457  NA 125* 127*  --  135  K 5.3* 4.1  --  3.9  CL 90* 93*  --  96*  CO2 22 24  --  26  GLUCOSE 150* 122*  --  120*  BUN 18 23  --  22  CREATININE 1.22* 1.13* 1.27* 1.45*  CALCIUM 8.7 8.5*  --  8.9  GFRNONAA 42* 46* 40* 34*  GFRAA 49* 54* 46* 40*  ANIONGAP  --  10  --  13    Recent Labs  Lab 05/30/18 1416  PROT 5.8*  ALBUMIN 3.6  AST 36  ALT 45*  ALKPHOS 90  BILITOT 0.5   Hematology Recent Labs  Lab 05/31/18 2002 06/01/18 0835 06/02/18 0457  WBC 7.4 9.1 6.9  RBC 4.27 4.37 4.46  HGB 11.5* 11.9* 11.8*  HCT 36.6 37.3 37.8  MCV 85.7 85.4 84.8  MCH 26.9 27.2 26.5  MCHC 31.4 31.9 31.2  RDW 17.0* 16.9* 16.8*  PLT 264 235 239   Cardiac EnzymesNo results for input(s): TROPONINI in the last 168 hours.  Recent Labs  Lab 05/31/18 2213  TROPIPOC 0.03    BNP Recent Labs  Lab 05/31/18 2002  BNP 3,318.2*    DDimer No results for input(s): DDIMER in the last 168  hours.  Radiology/Studies:  Dg Chest 2 View  Result Date: 05/31/2018 CLINICAL DATA:  Bilateral leg swelling.  Shortness of breath, cough EXAM: CHEST - 2 VIEW COMPARISON:  12/23/2017 FINDINGS: Cardiomegaly. Small bilateral pleural effusions with bibasilar atelectasis. Mild vascular congestion. No overt edema. IMPRESSION: Cardiomegaly with vascular congestion. Small effusions with bibasilar atelectasis. Electronically Signed   By: Charlett Nose M.D.   On: 05/31/2018 23:15    Assessment and Plan:   1. Acute on chronic combined CHF: Patient presented with SOB, LE edema, weight gain, and abdominal distention worsening x1 week. BNP 3318. CXR with vascular congestion. She was started on IV lasix 40mg  BID with UOP net -1.8L this admission. Weight 115>113lbs (up from 106 lbs 02/2018). Cr trend over the past 3 days: Cr 1.13>1.27>1.45. Echo pending. Overall does not appear significantly volume overloaded - lungs are clear and trace LE edema noted - Continue IV lasix 40mg  tonight - anticipate transition to po tomorrow - Continue to monitor strict I&Os and daily weights - Will follow-up echocardiogram results - Continue carvedilol  - Could consider addition of entresto now that BP will tolerate and she appears to be near euvolemic  2. CAD s/p PCI/DES to LAD x2 09/2017: No clear anginal complaints per chart review. She was transitioned from plavix to brilinta due to being a non-responder to plavix.  - Continue aspirin, brilinta, and statin - Continue carvedilol and imdur  3. HTN: BP persistently elevated.  - Continue carvedilol - Could consider adding entresto  4. CKD stage 3: Cr uptrending 1.27>1.45 today - Continue to monitor Cr closely with diuresis  5. ?Cognative deficits: unable to talk with daughter to determine if patient has any baseline memory difficulties. Attempts to use interpreter were unsuccessful as patient frequently answered questions inappropriately - Will defer to primary team     For questions or updates, please contact [facility] Please consult [email or web address] for contact info under Cardiology/STEMI.   Signed, Beatriz Stallion, PA-C  06/02/2018 1:18 PM [phone] ---------------------------------------------------------------------------------------------   History and all data above reviewed.  Patient examined.  I agree with the findings as above.  [name] Umana Clarisa Fling is a 78 yo female who are are asked to see for HF.  No collaborative history was able to be obtained despite the use of the iPad interpreter, the patient did not seem to understand our questions or the interpreter.  Her family was not present and we were unable to contact them by phone.  I am quite concerned about cognitive impairment.  Constitutional: Confused, but calm Eyes: pupils equally round and reactive to light, sclera non-icteric, normal conjunctiva and lids ENMT: normal dentition, moist mucous membranes Cardiovascular: regular rhythm, normal rate, no murmurs. S1 and S2 normal. Radial pulses normal bilaterally. No jugular venous distention.  Respiratory: clear to auscultation bilaterally GI : normal bowel sounds, soft and nontender.  No distention.   MSK: extremities warm, well perfused. No edema.  NEURO: grossly nonfocal exam, moves all extremities. PSYCH: not oriented, confused about the conversation and does not answer questions approrpiately.   All available labs, radiology testing, previous records reviewed. Agree with documented assessment and plan of my colleague as stated above with the following additions or changes:  Principal Problem:   Acute on chronic systolic CHF (congestive heart failure) (HCC) Active Problems:   Ischemic cardiomyopathy   Non-insulin dependent type 2 diabetes mellitus (HCC)   Acute combined systolic and diastolic CHF, NYHA class 3 (HCC)   Depression   Heart failure (HCC)   Cataract of both eyes   Congestion of upper airway   Essential  hypertension   CKD (chronic kidney disease) stage 3, GFR 30-59 ml/min (HCC)    Plan: We will continue the diuresis that has been initiated by the primary service as she seems to be responding to this.  Of primary concern at this time is her cognitive impairment.  We attempted to use the iPad interpreter to communicate with the patient, and spent 10-15 minutes trying to obtain a history.  The patient did not understand questions asked in Spanish such as "do you have chest pain" and "how is your breathing".  We have tried to call the patient's daughter to both obtain a history as well legs I expressed our concerns, however we were not able to reach anyone.  I am also uncertain if she has difficulty hearing which if she does not have cognitive impairment this may be the primary issue.  Without additional history, would plan to use objective evidence to continue diuresis and follow weights and ins and outs.  Length of Stay:  LOS: 2 days   Parke PoissonGayatri A Arpan Eskelson, MD [facility] 4:56 AM  06/03/2018

## 2018-06-03 ENCOUNTER — Inpatient Hospital Stay (HOSPITAL_COMMUNITY): Payer: Medicaid Other

## 2018-06-03 DIAGNOSIS — I370 Nonrheumatic pulmonary valve stenosis: Secondary | ICD-10-CM

## 2018-06-03 DIAGNOSIS — I361 Nonrheumatic tricuspid (valve) insufficiency: Secondary | ICD-10-CM

## 2018-06-03 LAB — BASIC METABOLIC PANEL
Anion gap: 13 (ref 5–15)
BUN: 26 mg/dL — ABNORMAL HIGH (ref 8–23)
CO2: 28 mmol/L (ref 22–32)
Calcium: 8.9 mg/dL (ref 8.9–10.3)
Chloride: 95 mmol/L — ABNORMAL LOW (ref 98–111)
Creatinine, Ser: 1.61 mg/dL — ABNORMAL HIGH (ref 0.44–1.00)
GFR calc Af Amer: 35 mL/min — ABNORMAL LOW (ref >60)
GFR, EST NON AFRICAN AMERICAN: 30 mL/min — AB (ref >60)
Glucose, Bld: 122 mg/dL — ABNORMAL HIGH (ref 70–99)
Potassium: 3.6 mmol/L (ref 3.5–5.1)
Sodium: 136 mmol/L (ref 135–145)

## 2018-06-03 LAB — CBC WITH DIFFERENTIAL/PLATELET
Abs Immature Granulocytes: 0.02 10*3/uL (ref 0.00–0.07)
Basophils Absolute: 0.1 10*3/uL (ref 0.0–0.1)
Basophils Relative: 1 %
Eosinophils Absolute: 0.3 10*3/uL (ref 0.0–0.5)
Eosinophils Relative: 4 %
HCT: 36.2 % (ref 36.0–46.0)
Hemoglobin: 11.6 g/dL — ABNORMAL LOW (ref 12.0–15.0)
Immature Granulocytes: 0 %
Lymphocytes Relative: 25 %
Lymphs Abs: 1.7 10*3/uL (ref 0.7–4.0)
MCH: 27.2 pg (ref 26.0–34.0)
MCHC: 32 g/dL (ref 30.0–36.0)
MCV: 84.8 fL (ref 80.0–100.0)
Monocytes Absolute: 0.7 10*3/uL (ref 0.1–1.0)
Monocytes Relative: 10 %
NEUTROS PCT: 60 %
Neutro Abs: 4.2 10*3/uL (ref 1.7–7.7)
Platelets: 228 10*3/uL (ref 150–400)
RBC: 4.27 MIL/uL (ref 3.87–5.11)
RDW: 16.9 % — ABNORMAL HIGH (ref 11.5–15.5)
WBC: 6.9 10*3/uL (ref 4.0–10.5)
nRBC: 0 % (ref 0.0–0.2)

## 2018-06-03 LAB — ECHOCARDIOGRAM COMPLETE
Height: 59 in
Weight: 1761.6 oz

## 2018-06-03 LAB — BRAIN NATRIURETIC PEPTIDE: B Natriuretic Peptide: 4134.6 pg/mL — ABNORMAL HIGH (ref 0.0–100.0)

## 2018-06-03 LAB — GLUCOSE, CAPILLARY
GLUCOSE-CAPILLARY: 122 mg/dL — AB (ref 70–99)
Glucose-Capillary: 191 mg/dL — ABNORMAL HIGH (ref 70–99)
Glucose-Capillary: 186 mg/dL — ABNORMAL HIGH (ref 70–99)
Glucose-Capillary: 137 mg/dL — ABNORMAL HIGH (ref 70–99)

## 2018-06-03 MED ORDER — ENOXAPARIN SODIUM 30 MG/0.3ML ~~LOC~~ SOLN
30.0000 mg | SUBCUTANEOUS | Status: DC
  Administered 2018-06-03: 30 mg via SUBCUTANEOUS
  Filled 2018-06-03: qty 0.3

## 2018-06-03 MED ORDER — FUROSEMIDE 40 MG PO TABS
40.0000 mg | ORAL_TABLET | Freq: Every day | ORAL | Status: DC
  Administered 2018-06-04: 40 mg via ORAL
  Filled 2018-06-03: qty 1

## 2018-06-03 NOTE — Progress Notes (Signed)
Progress Note  Patient Name: Sheila White Fawn KirkUmana De Cruz Date of Encounter: 06/03/2018  Primary Cardiologist: Nicki Guadalajarahomas Kelly, MD   Subjective   Spanish speaking only. Family present and discussed with them today, echo at bedside. No apparent new concerns, asking about discharge timing.  Inpatient Medications    Scheduled Meds: . aspirin  81 mg Oral Daily  . atorvastatin  80 mg Oral Daily  . carvedilol  6.25 mg Oral BID WC  . enoxaparin (LOVENOX) injection  40 mg Subcutaneous Q24H  . famotidine  20 mg Oral Daily  . fluticasone  2 spray Each Nare Daily  . furosemide  40 mg Intravenous Q12H  . insulin aspart  0-9 Units Subcutaneous TID WC  . isosorbide mononitrate  30 mg Oral Daily  . loratadine  10 mg Oral Daily  . sertraline  25 mg Oral QHS  . ticagrelor  90 mg Oral BID   Continuous Infusions:  PRN Meds: acetaminophen **OR** acetaminophen, benzonatate, nitroGLYCERIN, ondansetron **OR** ondansetron (ZOFRAN) IV   Vital Signs    Vitals:   06/02/18 1614 06/02/18 1952 06/03/18 0517 06/03/18 0931  BP: (!) 141/72 (!) 144/86 (!) 161/86 (!) 148/74  Pulse: 63 66 78 79  Resp: 20 18 18    Temp:  98.4 F (36.9 C) 98.4 F (36.9 C)   TempSrc:  Oral Oral   SpO2: 94% 99% 99%   Weight:   49.9 kg   Height:        Intake/Output Summary (Last 24 hours) at 06/03/2018 1126 Last data filed at 06/03/2018 1018 Gross per 24 hour  Intake 600 ml  Output 1700 ml  Net -1100 ml   Filed Weights   06/01/18 1504 06/02/18 0124 06/03/18 0517  Weight: 52.3 kg 51.6 kg 49.9 kg    Telemetry    SR - Personally Reviewed  ECG    No new since 12/5 - Personally Reviewed  Physical Exam   GEN: No acute distress, comfortable in bed  Neck: No JVD Cardiac: RRR, no murmurs, rubs, or gallops.  Respiratory: Clear to auscultation bilaterally. GI: Soft, nontender, non-distended  MS: No edema; No deformity. Neuro:  Nonfocal  Psych: calm, unclear level of understanding  Labs    Chemistry Recent  Labs  Lab 05/30/18 1416 05/31/18 2002 06/01/18 0835 06/02/18 0457 06/03/18 0509  NA 125* 127*  --  135 136  K 5.3* 4.1  --  3.9 3.6  CL 90* 93*  --  96* 95*  CO2 22 24  --  26 28  GLUCOSE 150* 122*  --  120* 122*  BUN 18 23  --  22 26*  CREATININE 1.22* 1.13* 1.27* 1.45* 1.61*  CALCIUM 8.7 8.5*  --  8.9 8.9  PROT 5.8*  --   --   --   --   ALBUMIN 3.6  --   --   --   --   AST 36  --   --   --   --   ALT 45*  --   --   --   --   ALKPHOS 90  --   --   --   --   BILITOT 0.5  --   --   --   --   GFRNONAA 42* 46* 40* 34* 30*  GFRAA 49* 54* 46* 40* 35*  ANIONGAP  --  10  --  13 13     Hematology Recent Labs  Lab 06/01/18 0835 06/02/18 0457 06/03/18 0509  WBC 9.1  6.9 6.9  RBC 4.37 4.46 4.27  HGB 11.9* 11.8* 11.6*  HCT 37.3 37.8 36.2  MCV 85.4 84.8 84.8  MCH 27.2 26.5 27.2  MCHC 31.9 31.2 32.0  RDW 16.9* 16.8* 16.9*  PLT 235 239 228    Cardiac EnzymesNo results for input(s): TROPONINI in the last 168 hours.  Recent Labs  Lab 05/31/18 2213  TROPIPOC 0.03     BNP Recent Labs  Lab 05/31/18 2002 06/03/18 0509  BNP 3,318.2* 4,134.6*     DDimer No results for input(s): DDIMER in the last 168 hours.   Radiology    No results found.  Cardiac Studies   Repeat echo underway currently. Prior echo and cath personally reviewed.  Patient Profile     77 y.o. 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  Assessment & Plan    Acute on chronic systolic and diastolic heart failure -negative 3.2 L, weight down from 52.3 to 49.9 kg since admission -Creatinine uptrending from 1.27-> 1.45 -> 1.61 -echo currently underway -continue carvedilol 6.25 mg BID -receiving IV lasix, transition to oral, first dose tomorrow given that her Cr is elevated today -on januvia, could also consider  jardiance given CAD/HF though would discuss cost if change is made -might be a candidate for entresto, would need to make sure she could obtain as an outpatient.  CAD s/p PCI to LAD: STEMI 09/2017 with late presentation (see notes), clopidogrel non responder. Prior course complicated by post infarction pericarditis, AKI -continue aspirin 81 mg -continue ticagrelor, has been maintained by samples as she could not afford prescription but is a non-responder to clopidogrel -continue atorvastatin 80 mg daily -continue imdur 30 mg daily -carvedilol as above  For questions or updates, please contact [facility] Please consult [email or web address] for contact info under   Jodelle Red, MD, PhD [facility]     Signed, Jodelle Red, MD  06/03/2018, 11:26 AM

## 2018-06-03 NOTE — Progress Notes (Signed)
PROGRESS NOTE    Sheila White  ZOX:[id number] DOB: 08-18-1940 DOA: 05/31/2018 PCP: Julieanne MansonMulberry, Elizabeth, MD    Subjective: The patient was seen and examined this morning, accompanied by family, her son is reporting, translating.   He is reporting improved shortness of breath, and lower extremity edema.  Denies any chest pain. Morning she has been diuresing well.  Cardiology consult  Brief Narrative:  Sheila White is a 77 y.o. female with medical history significant of systolic heart failure, coronary artery disease, type 2 diabetes mellitus, anxiety and depression.  Patient reports 7 days of worsening dyspnea, moderate to severe in intensity. It has been associated with lower extremity edema.  Worse with exertion, no improving factors.  Denies any angina or PND.  Usually she sleeps with 2 pillows in her back, and it has not changed for the last 7 days.  She was evaluated by her primary care physician who increase the dose of furosemide about a week ago but her symptoms have been persistent.  At baseline her physical activity is limited but has significantly decreased over the last 48 hours.  Her family member at bedside also has noticed weight gain, increased abdominal girth, and persistent dry cough. No recent change in her medications.  ED Course: Patient was noted to be hypervolemic, and dyspneic, received 40 mg of intravenous furosemide and was referred for admission for evaluation.   Assessment & Plan:   Principal Problem:   Acute on chronic systolic CHF (congestive heart failure) (HCC) Active Problems:   Ischemic cardiomyopathy   Non-insulin dependent type 2 diabetes mellitus (HCC)   Acute combined systolic and diastolic CHF, NYHA class 3 (HCC)   Depression   Heart failure (HCC)   Cataract of both eyes   Congestion of upper airway   Essential hypertension   CKD (chronic kidney disease) stage 3, GFR 30-59 ml/min (HCC)  Acute on chronic combined  systolic diastolic heart failure exacerbation/ischemic cardiomyopathy with EF of 30 to 35% -Presented with acute on chronic possibly systolic/diastolic congestive heart failure reduced EjF of 30-35% On echocardiogram 26 Oct 2017 with mid to distal anterior, apical and inferior apical severe hypokinesis.  -Also recent cardiac catheterization revealing LAD stenosis, -We will reconsult for further evaluation and recommendations -Ending repeat echocardiogram -Maximizing medical management including Lasix 40 mg IV 12 hours -Trend daily weight, I's and O's >> diuresed over 2.1 L -Continue current medications of aspirin, Coreg, Lipitor, Imdur, Brilinta -Appreciate cardiology input    Current weight is 51.6 kg from 52.3 kg on admission.  It is noted that last cardiology office visit on 03/27/2018 patient's weight was 48.4 kg.  Patient still volume overloaded on examination.  Continue Lasix 40 mg IV every 12 hours.  Continue current regimen of aspirin, Coreg, Lipitor, Imdur,brilinta.  Due to patient's complex cardiac history we will consult with cardiology for evaluation and management.  Non-insulin-dependent type 2 diabetes mellitus Hemoglobin A1c was 7.2 on 04/19/2018.   CBG AC at bedtime  Continue current regimen of sliding scale insulin.  Hold oral hypoglycemic agents.   Congestion of upper airway Placed on Claritin and Flonase.    Depression Stable.  Continue Zoloft.   Hypertension Continue Coreg and Imdur.  Patient on IV Lasix.    Chronic kidney disease stage III with hyponatremia Likely secondary to volume overload.  Sodium level has improved with diuresis.  Monitor with diuretics.  Follow.   DVT prophylaxis: Lovenox Code Status: Full Family Communication: Updated patient and family  at bedside.  Interpretation to patient's son Disposition Plan: Likely home once euvolemic with clinical improvement.   Consultants:   Cardiology  Procedures:   Chest x-ray 05/31/2018  2D echo  pending  Antimicrobials:   None     Objective: Vitals:   06/02/18 1952 06/03/18 0517 06/03/18 0931 06/03/18 1237  BP: (!) 144/86 (!) 161/86 (!) 148/74 129/66  Pulse: 66 78 79 66  Resp: 18 18  18   Temp: 98.4 F (36.9 C) 98.4 F (36.9 C)  (!) 97.4 F (36.3 C)  TempSrc: Oral Oral  Oral  SpO2: 99% 99%  98%  Weight:  49.9 kg    Height:        Intake/Output Summary (Last 24 hours) at 06/03/2018 1709 Last data filed at 06/03/2018 1018 Gross per 24 hour  Intake 120 ml  Output 1000 ml  Net -880 ml   Filed Weights   06/01/18 1504 06/02/18 0124 06/03/18 0517  Weight: 52.3 kg 51.6 kg 49.9 kg    Examination:  BP 129/66 (BP Location: Left Arm)   Pulse 66   Temp (!) 97.4 F (36.3 C) (Oral)   Resp 18   Ht 4\' 11"  (1.499 m)   Wt 49.9 kg   SpO2 98%   BMI 22.24 kg/m    Physical Exam  Constitution:  Alert, cooperative, no distress,  Psychiatric: Normal and stable mood and affect, cognition intact,   HEENT: Normocephalic, PERRL, otherwise with in Normal limits  Chest:Chest symmetric, improved crackles, positive breath sounds Cardio vascular:  S1/S2, RRR, No murmure, No Rubs or Gallops  pulmonary: Clear to auscultation bilaterally, respirations unlabored, negative wheezes / crackles Abdomen: Soft, non-tender, non-distended, bowel sounds,no masses, no organomegaly Muscular skeletal: Limited exam - in bed, able to move all 4 extremities, Normal strength,  Neuro: CNII-XII intact. , normal motor and sensation, reflexes intact  Extremities: No pitting edema lower extremities, +2 pulses  Skin: Dry, warm to touch, negative for any Rashes, No open wounds Wounds: per nursing documentation      Data Reviewed: I have personally reviewed following labs and imaging studies  CBC: Recent Labs  Lab 05/30/18 1416 05/31/18 2002 06/01/18 0835 06/02/18 0457 06/03/18 0509  WBC 8.1 7.4 9.1 6.9 6.9  NEUTROABS 6.2  --   --   --  4.2  HGB 11.9 11.5* 11.9* 11.8* 11.6*  HCT 36.4 36.6  37.3 37.8 36.2  MCV 85 85.7 85.4 84.8 84.8  PLT 275 264 235 239 228   Basic Metabolic Panel: Recent Labs  Lab 05/30/18 1416 05/31/18 2002 06/01/18 0835 06/02/18 0457 06/03/18 0509  NA 125* 127*  --  135 136  K 5.3* 4.1  --  3.9 3.6  CL 90* 93*  --  96* 95*  CO2 22 24  --  26 28  GLUCOSE 150* 122*  --  120* 122*  BUN 18 23  --  22 26*  CREATININE 1.22* 1.13* 1.27* 1.45* 1.61*  CALCIUM 8.7 8.5*  --  8.9 8.9   GFR: Estimated Creatinine Clearance: 19.3 mL/min (A) (by C-G formula based on SCr of 1.61 mg/dL (H)). Liver Function Tests: Recent Labs  Lab 05/30/18 1416  AST 36  ALT 45*  ALKPHOS 90  BILITOT 0.5  PROT 5.8*  ALBUMIN 3.6   No results for input(s): LIPASE, AMYLASE in the last 168 hours. No results for input(s): AMMONIA in the last 168 hours. Coagulation Profile: No results for input(s): INR, PROTIME in the last 168 hours. Cardiac Enzymes: No results for  input(s): CKTOTAL, CKMB, CKMBINDEX, TROPONINI in the last 168 hours. BNP (last 3 results) Recent Labs    12/01/17 1041  PROBNP 20,354*   HbA1C: No results for input(s): HGBA1C in the last 72 hours. CBG: Recent Labs  Lab 06/02/18 1607 06/02/18 2118 06/03/18 0809 06/03/18 1235 06/03/18 1631  GLUCAP 197* 81 122* 191* 186*     Radiology Studies: No results found.      Scheduled Meds: . aspirin  81 mg Oral Daily  . atorvastatin  80 mg Oral Daily  . carvedilol  6.25 mg Oral BID WC  . enoxaparin (LOVENOX) injection  30 mg Subcutaneous Q24H  . famotidine  20 mg Oral Daily  . fluticasone  2 spray Each Nare Daily  . [START ON 06/04/2018] furosemide  40 mg Oral Daily  . insulin aspart  0-9 Units Subcutaneous TID WC  . isosorbide mononitrate  30 mg Oral Daily  . loratadine  10 mg Oral Daily  . sertraline  25 mg Oral QHS  . ticagrelor  90 mg Oral BID   Continuous Infusions:   LOS: 2 days    Time spent: 35 minutes    Kendell Bane, MD [facility] Pager [phone]  If 7PM-7AM,  please contact night-coverage [email or web address] Password [email or web address] 06/03/2018, 5:09 PM

## 2018-06-03 NOTE — Progress Notes (Signed)
Patient resting comfortably during shift report. 

## 2018-06-03 NOTE — Progress Notes (Signed)
Patient received a 'safety tray' for dinner, per printed ticket.  Call placed to service response, requested whatever changed, be changed back to what it was for previous meals as pt does not meet safety tray indications.

## 2018-06-03 NOTE — Progress Notes (Signed)
  Echocardiogram 2D Echocardiogram has been performed.  Delcie RochENNINGTON, Brixton Franko 06/03/2018, 12:53 PM

## 2018-06-03 NOTE — Progress Notes (Signed)
MD, Echo, Pt and Two sons all present in room simultaneously. Second son does speak english.

## 2018-06-03 NOTE — Progress Notes (Signed)
Patient slept this shift without problems. Family at bedside all night. BP this am 161/86, hr=78. Afebrile, nonproductive cough.

## 2018-06-03 NOTE — Progress Notes (Signed)
Interpreter service utilized to administer medications. Each medication reviewed with pt and her son, pt and son denied questions of medications.   Pt and son informed of BNP level and educated on what it means.

## 2018-06-04 DIAGNOSIS — I5023 Acute on chronic systolic (congestive) heart failure: Secondary | ICD-10-CM

## 2018-06-04 LAB — COMPREHENSIVE METABOLIC PANEL
ALT: 27 U/L (ref 0–44)
AST: 29 U/L (ref 15–41)
Albumin: 3.2 g/dL — ABNORMAL LOW (ref 3.5–5.0)
Alkaline Phosphatase: 69 U/L (ref 38–126)
Anion gap: 13 (ref 5–15)
BILIRUBIN TOTAL: 0.9 mg/dL (ref 0.3–1.2)
BUN: 26 mg/dL — ABNORMAL HIGH (ref 8–23)
CO2: 27 mmol/L (ref 22–32)
Calcium: 8.6 mg/dL — ABNORMAL LOW (ref 8.9–10.3)
Chloride: 94 mmol/L — ABNORMAL LOW (ref 98–111)
Creatinine, Ser: 1.48 mg/dL — ABNORMAL HIGH (ref 0.44–1.00)
GFR calc Af Amer: 39 mL/min — ABNORMAL LOW (ref >60)
GFR calc non Af Amer: 33 mL/min — ABNORMAL LOW (ref >60)
Glucose, Bld: 137 mg/dL — ABNORMAL HIGH (ref 70–99)
POTASSIUM: 3.6 mmol/L (ref 3.5–5.1)
Sodium: 134 mmol/L — ABNORMAL LOW (ref 135–145)
Total Protein: 6.1 g/dL — ABNORMAL LOW (ref 6.5–8.1)

## 2018-06-04 LAB — BRAIN NATRIURETIC PEPTIDE: B Natriuretic Peptide: 3381.1 pg/mL — ABNORMAL HIGH (ref 0.0–100.0)

## 2018-06-04 LAB — GLUCOSE, CAPILLARY
Glucose-Capillary: 136 mg/dL — ABNORMAL HIGH (ref 70–99)
Glucose-Capillary: 243 mg/dL — ABNORMAL HIGH (ref 70–99)

## 2018-06-04 MED ORDER — BENZONATATE 100 MG PO CAPS: 100.0000 mg | ORAL_CAPSULE | Freq: Three times a day (TID) | ORAL | 0 refills | Status: DC | PRN

## 2018-06-04 MED ORDER — CARVEDILOL 6.25 MG PO TABS: 6.2500 mg | ORAL_TABLET | Freq: Two times a day (BID) | ORAL | 3 refills | Status: DC

## 2018-06-04 NOTE — Progress Notes (Signed)
Progress Note  Patient Name: Sheila White Date of Encounter: 06/04/2018  Primary Cardiologist: Nicki Guadalajara, MD   Subjective   Spanish speaking only. Son present and translated. Breathing improved, though has some clear phlegm. Swelling improved. No complaints today.  Inpatient Medications    Scheduled Meds: . aspirin  81 mg Oral Daily  . atorvastatin  80 mg Oral Daily  . carvedilol  6.25 mg Oral BID WC  . enoxaparin (LOVENOX) injection  30 mg Subcutaneous Q24H  . famotidine  20 mg Oral Daily  . fluticasone  2 spray Each Nare Daily  . furosemide  40 mg Oral Daily  . insulin aspart  0-9 Units Subcutaneous TID WC  . isosorbide mononitrate  30 mg Oral Daily  . loratadine  10 mg Oral Daily  . sertraline  25 mg Oral QHS  . ticagrelor  90 mg Oral BID   Continuous Infusions:  PRN Meds: acetaminophen **OR** acetaminophen, benzonatate, nitroGLYCERIN, ondansetron **OR** ondansetron (ZOFRAN) IV   Vital Signs    Vitals:   06/03/18 1816 06/03/18 1937 06/04/18 0505 06/04/18 0505  BP: (!) 144/82 131/78  (!) 159/93  Pulse:  69  74  Resp:  18  18  Temp:  98.2 F (36.8 C)  98.7 F (37.1 C)  TempSrc:  Oral  Oral  SpO2:  95%  100%  Weight:   49.7 kg   Height:        Intake/Output Summary (Last 24 hours) at 06/04/2018 0856 Last data filed at 06/04/2018 0500 Gross per 24 hour  Intake 560 ml  Output 1500 ml  Net -940 ml   Filed Weights   06/02/18 0124 06/03/18 0517 06/04/18 0505  Weight: 51.6 kg 49.9 kg 49.7 kg    Telemetry    SR - Personally Reviewed  ECG    No new since 12/5 - Personally Reviewed  Physical Exam   GEN: No acute distress, comfortable in bed  Neck: No JVD at 45 degrees Cardiac: RRR, no murmurs, rubs, or gallops.  Respiratory: Clear to auscultation bilaterally except for faint crackles at bases. GI: Soft, nontender, non-distended  MS: No significant LE edema; No deformity. Neuro:  Nonfocal  Psych: calm, unclear level of  understanding  Labs    Chemistry Recent Labs  Lab 05/30/18 1416  06/02/18 0457 06/03/18 0509 06/04/18 0754  NA 125*   < > 135 136 134*  K 5.3*   < > 3.9 3.6 3.6  CL 90*   < > 96* 95* 94*  CO2 22   < > 26 28 27   GLUCOSE 150*   < > 120* 122* 137*  BUN 18   < > 22 26* 26*  CREATININE 1.22*   < > 1.45* 1.61* 1.48*  CALCIUM 8.7   < > 8.9 8.9 8.6*  PROT 5.8*  --   --   --  6.1*  ALBUMIN 3.6  --   --   --  3.2*  AST 36  --   --   --  29  ALT 45*  --   --   --  27  ALKPHOS 90  --   --   --  69  BILITOT 0.5  --   --   --  0.9  GFRNONAA 42*   < > 34* 30* 33*  GFRAA 49*   < > 40* 35* 39*  ANIONGAP  --    < > 13 13 13    < > = values in  this interval not displayed.     Hematology Recent Labs  Lab 06/01/18 0835 06/02/18 0457 06/03/18 0509  WBC 9.1 6.9 6.9  RBC 4.37 4.46 4.27  HGB 11.9* 11.8* 11.6*  HCT 37.3 37.8 36.2  MCV 85.4 84.8 84.8  MCH 27.2 26.5 27.2  MCHC 31.9 31.2 32.0  RDW 16.9* 16.8* 16.9*  PLT 235 239 228    Cardiac EnzymesNo results for input(s): TROPONINI in the last 168 hours.  Recent Labs  Lab 05/31/18 2213  TROPIPOC 0.03     BNP Recent Labs  Lab 05/31/18 2002 06/03/18 0509  BNP 3,318.2* 4,134.6*     DDimer No results for input(s): DDIMER in the last 168 hours.   Radiology    No results found.  Cardiac Studies   Echo 06/03/18 Study Conclusions  - Left ventricle: The cavity size was normal. Systolic function was   moderately to severely reduced. The estimated ejection fraction   was in the range of 30% to 35%. Severe hypokinesis of the   mid-apicalanteroseptal, anterior, inferoseptal, and apical   myocardium. Doppler parameters are consistent with a reversible   restrictive pattern, indicative of decreased left ventricular   diastolic compliance and/or increased left atrial pressure (grade   3 diastolic dysfunction). Doppler parameters are consistent with   high ventricular filling pressure. - Aortic valve: Transvalvular velocity was  within the normal range.   There was no stenosis. There was trivial regurgitation. - Mitral valve: Calcified annulus. Transvalvular velocity was   within the normal range. There was no evidence for stenosis.   There was trivial regurgitation. - Left atrium: The atrium was severely dilated. - Right ventricle: The cavity size was normal. Wall thickness was   normal. Systolic function was normal. - Right atrium: The atrium was moderately dilated. - Atrial septum: No defect or patent foramen ovale was identified   by color flow Doppler. - Tricuspid valve: There was mild regurgitation. - Pulmonary arteries: Systolic pressure was within the normal   range. PA peak pressure: 33 mm Hg (S). - Pericardium, extracardiac: There was a large left pleural   effusion.  Impressions:  - Compared with the echo 10/2017, no significant changes.  Patient Profile     77 y.o. 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  Assessment & Plan    Acute on chronic systolic and diastolic heart failure: overall looking nearly euvolemic. Family had asked about going home today, ok from a cardiac standpoint as long as she monitors her weight and contacts us if her weight goes up. -negative 4.4 L, weight down from 52.3 to 49.7 kg since admission -Creatinine was uptrending from 1.27-> 1.45 -> 1.61 but trending down this AM to 1.48 -echo unchanged -continue carvedilol 6.25 mg BID -started on oral lasix today -on januvia, could also consider jardiance given CAD/HF though would discuss cost if change is made -would be a candidate for entresto, would need to make sure she could obtain as an outpatient. We can follow up with her on this as an outpatient.  Heart failure instructions: would give Spanish instructions to her that emphasize  the following: Do the following things EVERY DAY:  1) Weigh yourself EVERY morning after you go to the bathroom but before you eat or drink anything. Write this number down in a weight log/diary. If you gain  3 pounds overnight or 5 pounds in a week, call the office.  2) Take your medicines as prescribed. If you have concerns about your medications, please call us before you stop taking them.   3) Eat low salt foods-Limit salt (sodium) to 2000 mg per day. This will help prevent your body from holding onto fluid. Read food labels as many processed foods have a lot of sodium, especially canned goods and prepackaged meats. If you would like some assistance choosing low sodium foods, we would be happy to set you up with a nutritionist.  4) Stay as active as you can everyday. Staying active will give you more energy and make your muscles stronger. Start with 5 minutes at a time and work your way up to 30 minutes a day. Break up your activities--do some in the morning and some in the afternoon. Start with 3 days per week and work your way up to 5 days as you can.  If you have chest pain, feel short of breath, dizzy, or lightheaded, STOP. If you don't feel better after a short rest, call 911. If you do feel better, call the office to let us know you have symptoms with exercise.  5) Limit all fluids for the day to less than 2 liters. Fluid includes all drinks, coffee, juice, ice chips, soup, jello, and all other liquids.   CAD s/p PCI to LAD: STEMI 09/2017 with late presentation (see notes), clopidogrel non responder. 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   Medication  Recommendations:  Continue aspirin, atorvastatin, carvedilol, furosemide, isosorbide, ticagrelor as ordered Other recommendations (labs, testing, etc):  BMET with PCP in 1 week Follow up as an outpatient: I have requested a transition of care appointment with the office so she can be seen within 2 weeks.  For questions or updates, please contact [facility] Please consult [email or web address] for contact info under   Jodelle Red, MD, PhD [facility]     Signed, Jodelle Red, MD  06/04/2018, 8:56 AM

## 2018-06-04 NOTE — Progress Notes (Addendum)
Completed pt documentation on home unit 2H. Pt nauseous this am. Frequent Strong congested cough. Clear frothy liquid Spit up into baisen. No insulin coverage given due to this and pt not wanting breakfast. Did however, give pt morning meds. (See MAR) Will continue to monitor.

## 2018-06-04 NOTE — Progress Notes (Signed)
Report received on patient at 1130am. None of patient's morning medications were administered prior to this RN's arrival, including due breakfast insulin.   Will attempt to get pt up to date asap.

## 2018-06-04 NOTE — Progress Notes (Signed)
Pyxis-pulls and MAR/Opened but unscanned medications cross-referenced to determine pts medication needs to be UTD for today.    Previous RN notified to complete charting prior to pt dc.

## 2018-06-04 NOTE — Discharge Summary (Signed)
Physician Discharge Summary [facility]    Patient: Sheila White                   Admit date: 05/31/2018   DOB: [birth year]             Discharge date:06/04/2018/12:52 PM ZOX:[id number]                           PCP: Sheila Manson, MD  Recommendations for Outpatient Follow-up:   . Follow up: Cardiology 1 to 2 weeks, PCP in 2 to 3 weeks, transitional care clinic in 1 to 2 weeks.  Discharge Condition: Stable   Code Status:   Code Status: Full Code  Diet recommendation: Cardiac/diabetic diet    Discharge Diagnoses:    Principal Problem:   Acute on chronic systolic CHF (congestive heart failure) (HCC) Active Problems:   Ischemic cardiomyopathy   Non-insulin dependent type 2 diabetes mellitus (HCC)   Acute combined systolic and diastolic CHF, NYHA class 3 (HCC)   Depression   Heart failure (HCC)   Cataract of both eyes   Congestion of upper airway   Essential hypertension   CKD (chronic kidney disease) stage 3, GFR 30-59 ml/min (HCC)   History of Present Illness/ Hospital Course Sheila White Summary:   Sheila White Sheila Rushing Cruzis a 77 y.o.femalewith medical history significant ofsystolic heart failure, coronary artery disease, type 2 diabetes mellitus, anxiety and depression. Patient reports 7 days of worsening dyspnea, moderate to severe in intensity. It has been associated with lower extremity edema.Worse with exertion, no improving factors. Denies any angina or PND. Usually she sleeps with 2 pillows in her back, and it has notchangedfor the last 7 days. She was evaluated by her primary care physician who increase the dose of furosemide about a week ago but her symptoms have been persistent. At baseline her physical activity is limited but has significantly decreased over the last 48 hours. Her family member at bedside also has noticed weight gain,increased abdominal girth,and persistent dry cough. No recent change in her  medications.  ED Course:Patient was noted to be hypervolemic,anddyspneic,received 40 mgof intravenous furosemide and was referred for admission for evaluation.   Patient was subsequently admitted, diuresed aggressively.  Cardiology was consulted, patient had a repeat 2D echocardiogram was essentially unchanged.  Medication has been optimized by cardiology.  Patient is to follow-up as an outpatient.  Detailed discharge summary:   Acute on chronic combined systolic diastolic heart failure exacerbation/ischemic cardiomyopathy with EF of 30 to 35% -Presented with acute on chronic possibly systolic/diastolic congestive heart failure reduced EjF of 30-35% On echocardiogram 26 Oct 2017 with mid to distal anterior, apical and inferior apical severe hypokinesis.  Repeated 2D echocardiogram from 06/03/2018 revealed no acute changes, essentially unchanged. -Also recent cardiac catheterization revealing LAD stenosis,  -Maximizing medical management including Lasix 40 mg IV 12 hours>> was switched back to 40 mg p.o. daily as patient is becoming too dry, hypotensive, elevated creatinine. -Fluid output 4.4 L, Weight down from 52.3 to 49.7 kg since admission  -Continue current medications of aspirin, Coreg, Lipitor, Imdur, Brilinta  -Appreciated  cardiology input     Non-insulin-dependent type 2 diabetes mellitus Hemoglobin A1c was 7.2 on 04/19/2018.   CBG AC at bedtime  Continue current regimen of sliding scale insulin.  Hold oral hypoglycemic agents.   Congestion of upper airway Placed on Claritin and Flonase.    Depression Stable.  Continue Zoloft.  Hypertension Stable  Continue Coreg and Imdur.    Back to p.o. Lasix    Acute Chronic kidney disease stage III / with hyponatremia Likely secondary to volume overload.  Sodium level has improved with diuresis.  Monitor with diuretics.   Creatinine was uptrending from 1.27-> 1.45 -> 1.61  Sodium today 134, K 3.6   Code  Status: Full Family Communication: Updated patient and family at bedside.  Interpretation to patient's son Disposition Plan:  home    Consultants:   Cardiology  Procedures:   Chest x-ray 05/31/2018  2D echo:  Study Conclusions  - Left ventricle: The cavity size was normal. Systolic function was   moderately to severely reduced. The estimated ejection fraction   was in the range of 30% to 35%. Severe hypokinesis of the   mid-apicalanteroseptal, anterior, inferoseptal, and apical   myocardium. Doppler parameters are consistent with a reversible   restrictive pattern, indicative of decreased left ventricular   diastolic compliance and/or increased left atrial pressure (grade   3 diastolic dysfunction). Doppler parameters are consistent with   high ventricular filling pressure. - Aortic valve: Transvalvular velocity was within the normal range.   There was no stenosis. There was trivial regurgitation. - Mitral valve: Calcified annulus. Transvalvular velocity was   within the normal range. There was no evidence for stenosis.   There was trivial regurgitation. - Left atrium: The atrium was severely dilated. - Right ventricle: The cavity size was normal. Wall thickness was   normal. Systolic function was normal. - Right atrium: The atrium was moderately dilated. - Atrial septum: No defect or patent foramen ovale was identified   by color flow Doppler. - Tricuspid valve: There was mild regurgitation. - Pulmonary arteries: Systolic pressure was within the normal   range. PA peak pressure: 33 mm Hg (S). - Pericardium, extracardiac: There was a large left pleural   effusion.  Impressions:  - Compared with the echo 10/2017, no significant changes.   Discharge Instructions:   Discharge Instructions    Activity as tolerated - No restrictions   Complete by:  As directed    Diet - low sodium heart healthy   Complete by:  As directed    Discharge instructions   Complete by:  As  directed    echo unchanged -continue carvedilol 6.25 mg BID -on januvia, could also consider jardiance given CAD/HF though would discuss cost if change is made Card --> follow up with her on this as an outpatient.  Heart failure instructions: would give Spanish instructions to her that emphasize the following: Do the following things EVERY DAY:  1. Weigh yourself EVERY morning after you go to the bathroom but before you eat or drink anything. Write this number down in a weight log/diary. If you gain 3 pounds overnight or 5 pounds in a week, call the office.  2. Take your medicines as prescribed. If you have concerns about your medications, please call us before you stop taking them.   3. Eat low salt foods-Limit salt (sodium) to 2000 mg per day. This will help prevent your body from holding onto fluid. Read food labels as many processed foods have a lot of sodium, especially canned goods and prepackaged meats. If you would like some assistance choosing low sodium foods, we would be happy to set you up with a nutritionist.  4. Stay as active as you can everyday. Staying active will give you more energy and make your muscles stronger. Start with 5 minutes at a  time and work your way up to 30 minutes a day. Break up your activities--do some in the morning and some in the afternoon. Start with 3 days per week and work your way up to 5 days as you can.  If you have chest pain, feel short of breath, dizzy, or lightheaded, STOP. If you don't feel better after a short rest, call 911. If you do feel better, call the office to let us know you have symptoms with exercise.  5. Limit all fluids for the day to less than 2 liters. Fluid includes all drinks, coffee, juice, ice chips, soup, jello, and all other liquids.   -continue aspirin 81 mg -continue ticagrelor, has been maintained by samples as she could not afford prescription but is a non-responder to clopidogrel -continue atorvastatin 80 mg  daily -continue imdur 30 mg daily -carvedilol 6.25 mg BID   Medication Recommendations:  Continue aspirin, atorvastatin, carvedilol, furosemide, isosorbide, ticagrelor as ordered Other recommendations (labs, testing, etc):  BMET with PCP in 1 week Follow up as an outpatient: requeste a transition of care appointment with the office so she can be seen within 2 weeks   Increase activity slowly   Complete by:  As directed        Medication List    TAKE these medications   aspirin 81 MG chewable tablet Chew 1 tablet (81 mg total) by mouth daily.   atorvastatin 80 MG tablet Commonly known as:  LIPITOR Take 1 tablet (80 mg total) by mouth daily.   benzonatate 100 MG capsule Commonly known as:  TESSALON Take 1 capsule (100 mg total) by mouth 3 (three) times daily as needed for cough. What changed:    how much to take  how to take this  when to take this  reasons to take this  additional instructions   carvedilol 6.25 MG tablet Commonly known as:  COREG Take 1 tablet (6.25 mg total) by mouth 2 (two) times daily with a meal.   famotidine 20 MG tablet Commonly known as:  PEPCID 2 tabs by mouth at bedtime.   Ferrous Gluconate 324 (37.5 Fe) MG Tabs 1 tab by mouth twice daily with half and orange   fluticasone 50 MCG/ACT nasal spray Commonly known as:  FLONASE Place 2 sprays into both nostrils daily.   furosemide 40 MG tablet Commonly known as:  LASIX 1 tab by mouth daily in morning   glimepiride 2 MG tablet Commonly known as:  AMARYL Take 1 tablet (2 mg total) by mouth daily with breakfast.   isosorbide mononitrate 30 MG 24 hr tablet Commonly known as:  IMDUR Take 1 tablet (30 mg total) by mouth daily. What changed:    how much to take  when to take this   loratadine 10 MG tablet Commonly known as:  CLARITIN Take 1 tablet (10 mg total) by mouth daily. What changed:    when to take this  reasons to take this   nitroGLYCERIN 0.4 MG SL tablet Commonly  known as:  NITROSTAT Place 1 tablet (0.4 mg total) under the tongue every 5 (five) minutes x 3 doses as needed for chest pain.   sertraline 50 MG tablet Commonly known as:  ZOLOFT Take 1 tablet (50 mg total) by mouth at bedtime. What changed:  how much to take   sitaGLIPtin 100 MG tablet Commonly known as:  JANUVIA 1/2 tab by mouth once daily with morning meal.   ticagrelor 90 MG Tabs tablet Commonly known as:  BRILINTA Take 1 tablet (90 mg total) by mouth 2 (two) times daily.       No Known Allergies   Procedures /Studies:   Dg Chest 2 View  Result Date: 05/31/2018 CLINICAL DATA:  Bilateral leg swelling.  Shortness of breath, cough EXAM: CHEST - 2 VIEW COMPARISON:  12/23/2017 FINDINGS: Cardiomegaly. Small bilateral pleural effusions with bibasilar atelectasis. Mild vascular congestion. No overt edema. IMPRESSION: Cardiomegaly with vascular congestion. Small effusions with bibasilar atelectasis. Electronically Signed   By: Charlett Nose M.D.   On: 05/31/2018 23:15     Subjective:   Patient was seen and examined 06/04/2018, 12:52 PM Patient stable today. No acute distress.  No issues overnight Stable for discharge.  Discharge Exam:    Vitals:   06/03/18 1937 06/04/18 0505 06/04/18 0505 06/04/18 1242  BP: 131/78  (!) 159/93 137/80  Pulse: 69  74 68  Resp: 18  18 18   Temp: 98.2 F (36.8 C)  98.7 F (37.1 C) 97.9 F (36.6 C)  TempSrc: Oral  Oral Oral  SpO2: 95%  100% 96%  Weight:  49.7 kg    Height:        General: Pt lying comfortably in bed & appears in no obvious distress. Cardiovascular: S1 & S2 heard, RRR, S1/S2 +. No murmurs, rubs, gallops or clicks. No JVD or pedal edema. Respiratory: Clear to auscultation without wheezing, rhonchi or crackles. No increased work of breathing. Abdominal:  Non-distended, non-tender & soft. No organomegaly or masses appreciated. Normal bowel sounds heard. CNS: Alert and oriented. No focal deficits. Extremities: no edema, no  cyanosis    The results of significant diagnostics from this hospitalization (including imaging, microbiology, ancillary and laboratory) are listed below for reference.      Microbiology:   No results found for this or any previous visit (from the past 240 hour(s)).   Labs:   CBC: Recent Labs  Lab 05/30/18 1416 05/31/18 2002 06/01/18 0835 06/02/18 0457 06/03/18 0509  WBC 8.1 7.4 9.1 6.9 6.9  NEUTROABS 6.2  --   --   --  4.2  HGB 11.9 11.5* 11.9* 11.8* 11.6*  HCT 36.4 36.6 37.3 37.8 36.2  MCV 85 85.7 85.4 84.8 84.8  PLT 275 264 235 239 228   Basic Metabolic Panel: Recent Labs  Lab 05/30/18 1416 05/31/18 2002 06/01/18 0835 06/02/18 0457 06/03/18 0509 06/04/18 0754  NA 125* 127*  --  135 136 134*  K 5.3* 4.1  --  3.9 3.6 3.6  CL 90* 93*  --  96* 95* 94*  CO2 22 24  --  26 28 27   GLUCOSE 150* 122*  --  120* 122* 137*  BUN 18 23  --  22 26* 26*  CREATININE 1.22* 1.13* 1.27* 1.45* 1.61* 1.48*  CALCIUM 8.7 8.5*  --  8.9 8.9 8.6*   Liver Function Tests: Recent Labs  Lab 05/30/18 1416 06/04/18 0754  AST 36 29  ALT 45* 27  ALKPHOS 90 69  BILITOT 0.5 0.9  PROT 5.8* 6.1*  ALBUMIN 3.6 3.2*   BNP (last 3 results) Recent Labs    05/31/18 2002 06/03/18 0509 06/04/18 0754  BNP 3,318.2* 4,134.6* 3,381.1*   Cardiac Enzymes: No results for input(s): CKTOTAL, CKMB, CKMBINDEX, TROPONINI in the last 168 hours. CBG: Recent Labs  Lab 06/03/18 1235 06/03/18 1631 06/03/18 2040 06/04/18 0757 06/04/18 1242  GLUCAP 191* 186* 137* 136* 243*   Urinalysis    Component Value Date/Time   COLORURINE YELLOW 12/23/2017 0816  APPEARANCEUR HAZY (A) 12/23/2017 0816   LABSPEC 1.010 12/23/2017 0816   PHURINE 6.0 12/23/2017 0816   GLUCOSEU NEGATIVE 12/23/2017 0816   HGBUR NEGATIVE 12/23/2017 0816   BILIRUBINUR neg 01/04/2018 1157   KETONESUR NEGATIVE 12/23/2017 0816   PROTEINUR Positive (A) 01/04/2018 1157   PROTEINUR NEGATIVE 12/23/2017 0816   UROBILINOGEN 0.2  01/04/2018 1157   NITRITE neg 01/04/2018 1157   NITRITE NEGATIVE 12/23/2017 0816   LEUKOCYTESUR Small (1+) (A) 01/04/2018 1157    Time coordinating discharge: Over 30 minutes  SIGNED: Kendell BaneSeyed A , MD, FACP, FHM. [facility],  Pager [phone]  If 7PM-7AM, please contact night-coverage [email or web address], Password [email or web address] 06/04/2018, 12:52 PM

## 2018-06-04 NOTE — Progress Notes (Signed)
Patient discharged with assistance of spanish speaking staff. All family members spoken to, ensure no questions were left unanswered.   AVS provided in spanish and in english; MD note which would not translate was manually translated via RN and assistance from bilingual NT; to ensure clarity of AVS due to length of written directions/instructions. Visual aids for measuring 2L limit provided at d/c.   Pt wheeled to car by [facility] staff.

## 2018-06-15 ENCOUNTER — Telehealth: Payer: Self-pay | Admitting: Cardiovascular Disease

## 2018-06-15 MED ORDER — TICAGRELOR 90 MG PO TABS: 90.0000 mg | ORAL_TABLET | Freq: Two times a day (BID) | ORAL | 5 refills | Status: DC

## 2018-06-15 NOTE — Telephone Encounter (Signed)
Spoke to patient's daughter office out of Brilinta 90 mg.samples. Prescription sent to pharmacy.

## 2018-06-15 NOTE — Telephone Encounter (Signed)
Patient calling the office for samples of medication:   1.  What medication and dosage are you requesting samples for? ticagrelor (BRILINTA) 90 MG TABS tablet   2.  Are you currently out of this medication? Yes    

## 2018-07-10 ENCOUNTER — Telehealth: Payer: Self-pay | Admitting: Internal Medicine

## 2018-07-10 MED ORDER — SERTRALINE HCL 50 MG PO TABS: 25.0000 mg | ORAL_TABLET | Freq: Every day | ORAL | 11 refills | Status: DC

## 2018-07-11 NOTE — Telephone Encounter (Signed)
Left message for rosa to call back with how mother is taking sertraline

## 2018-07-11 NOTE — Telephone Encounter (Signed)
Spoke with Sheila White first states patient is taking a whole pill, then states she is taking a 1/2 pill. I asked 3 times states in the end she Is taking 1/2 a pill daily.

## 2018-07-27 ENCOUNTER — Ambulatory Visit (INDEPENDENT_AMBULATORY_CARE_PROVIDER_SITE_OTHER): Payer: Self-pay | Admitting: Cardiovascular Disease

## 2018-07-27 VITALS — BP 114/60 | HR 62 | Ht 59.0 in | Wt 106.0 lb

## 2018-07-27 DIAGNOSIS — E785 Hyperlipidemia, unspecified: Secondary | ICD-10-CM

## 2018-07-27 DIAGNOSIS — I213 ST elevation (STEMI) myocardial infarction of unspecified site: Secondary | ICD-10-CM

## 2018-07-27 DIAGNOSIS — I5042 Chronic combined systolic (congestive) and diastolic (congestive) heart failure: Secondary | ICD-10-CM

## 2018-07-27 DIAGNOSIS — I10 Essential (primary) hypertension: Secondary | ICD-10-CM

## 2018-07-27 DIAGNOSIS — I255 Ischemic cardiomyopathy: Secondary | ICD-10-CM

## 2018-07-27 MED ORDER — SACUBITRIL-VALSARTAN 24-26 MG PO TABS: 1.0000 | ORAL_TABLET | Freq: Two times a day (BID) | ORAL | 3 refills | Status: DC

## 2018-07-27 MED ORDER — FUROSEMIDE 20 MG PO TABS: ORAL_TABLET | ORAL | 1 refills | Status: DC

## 2018-07-27 NOTE — Patient Instructions (Signed)
Medication Instructions:  Start Entresto 24/26 Change Lasix to 20 mg every other day. If you need a refill on your cardiac medications before your next appointment, please call your pharmacy.   Lab work: In 10 days, have a BMET, Pro BNP, and CBC If you have labs (blood work) drawn today and your tests are completely normal, you will receive your results only by: Marland Kitchen MyChart Message (if you have MyChart) OR . A paper copy in the mail If you have any lab test that is abnormal or we need to change your treatment, we will call you to review the results.   Follow-Up: At [facility], you and your health needs are our priority.  As part of our continuing mission to provide you with exceptional heart care, we have created designated Provider Care Teams.  These Care Teams include your primary Cardiologist (physician) and Advanced Practice Providers (APPs -  Physician Assistants and Nurse Practitioners) who all work together to provide you with the care you need, when you need it. Follow up with Pharmacist in 2 weeks. You will need a follow up appointment in 2 months. You may see Nicki Guadalajara, MD or one of the following Advanced Practice Providers on your designated Care Team: [place], [place] . Micah Flesher, PA-C

## 2018-07-27 NOTE — Progress Notes (Signed)
Cardiology Office Note    Date:  07/29/2018   ID:  Ryen Heitmeyer, [place] 1941-05-24, MRN [id number]  PCP:  Mack Hook, MD  Cardiologist:  Shelva Majestic, MD   4 month F/u office visit evaluation  History of Present Illness:  Sheila White is a 78 y.o. female who presented to [facility] on October 05, 2017 with an ST segment elevation anterior myocardial infarction with late presentation.  Her symptoms had developed the day prior to admission and became more progressive with reference to shortness of breath and continued chest pain.  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  She had 50% proximal OM1 stenosis followed by 95% distal marginal stenosis and there was mild irregularity of the RCA.  She underwent successful PCI to the LAD with ultimate insertion of a 2.5 x 26 mm Resolute DES stent postdilated 2.8 proximally and 2.68 distally with a 99% stenosis reduced to 0%.  The mid LAD stenosis was treated with DES stenting with a 2 to 5 x 12 mm Resolute stent.  The apical stenosis was treated with PTCA.  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  Her peak creatinine was 3.86 which improved to 1.88 at discharge.  He was ultimately discharged on October 24, 2017 and on Oct 28, 2017 so Sheila White for her initial office evaluation.  Patient has had issues with chronic cough.  Her pericardial symptoms had improved and she was treated with colchicine.    I saw her for initial post hospital follow-up evaluation on Nov 18, 2017.  At that time she denied any recurrent chest pain symptomatology.  She was tentatively scheduled to travel to [place] several days later and I recommended that she postpone this trip.  Since I saw her, she  developed some chest pain and was hospitalized  on May 25 through Nov 22, 2017.  Troponins were normal.  Follow-up echo Doppler study showed an EF of 30 to 35%.  There was mid distal anterior, apical inferoapical severe hypokinesis secondary to her prior LAD territory infarct.  There was grade 1 diastolic dysfunction.  She also complained of some blurry vision with gait abnormality.  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  She was hyponatremic which revolved resolved.  She was seen by Sheila White post discharge on December 01, 2017.  At that time, her blood pressure was stable and pulse 77.  I saw her in June 2019.  At that time the plan was to start Plavix due to her foreign status and apparently Plavix only rather than Brilinta could be used.  However, P2Y12 test demonstrated that she was not responsive to Plavix.  As a result we have been supplying her with samples of Brilinta.   I last saw her in September 2019.  Her renal function was gradually improving.  She was not having any anginal symptoms and her blood pressure was stable.  The past several months, she has continued to feel well.  Most recent laboratory has shown further improvement in renal function with a creatinine of 1.48 improved from 1.61 in December 2019.  She has continued to be stable.  She has been taking aspirin and Brilinta for DAPT.  She is on atorvastatin 80 mg for hyperlipidemia with target LDL less than 70.  She is unaware of any palpitations and continues to  take carvedilol 6.25 mg twice a day.  She has been taking furosemide 40 mg daily without edema.  She presents for evaluation.  Past Medical History:  Diagnosis Date  . Acute combined systolic and diastolic CHF, NYHA class 3 ([place]) 10/10/2017   in setting of MI  . Acute cystitis without hematuria   . Acute on chronic systolic congestive heart failure ([place]) 10/20/2017  . Anxiety and depression 12/19/2017  . Chest pain 10/31/2017  . Chest pain, rule out acute myocardial infarction 11/19/2017  . Coronary artery disease  10/31/2017  . Dyspnea 10/05/2017  . Heart failure ([place]) 12/23/2017  . HOH (hard of hearing)   . Ischemic cardiomyopathy 10/05/2017  . MI, acute, non ST segment elevation ([place])   . Non-insulin dependent type 2 diabetes mellitus ([place]) 2004  . 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 STEMI involving left anterior descending coronary artery ([place]) 10/05/2017    Past Surgical History:  Procedure Laterality Date  . CORONARY/GRAFT ACUTE MI REVASCULARIZATION N/A 10/05/2017   Procedure: Coronary/Graft Acute MI Revascularization;  Surgeon: Troy Sine, MD;  Location: [facility];  Service: Cardiovascular;  Laterality: N/A;  . LEFT HEART CATH AND CORONARY ANGIOGRAPHY N/A 10/05/2017   Procedure: LEFT HEART CATH AND CORONARY ANGIOGRAPHY;  Surgeon: Troy Sine, MD;  Location: [facility];  Service: Cardiovascular;  Laterality: N/A;    Current Medications: Outpatient Medications Prior to Visit  Medication Sig Dispense Refill  . aspirin 81 MG chewable tablet Chew 1 tablet (81 mg total) by mouth daily. 60 tablet 2  . atorvastatin (LIPITOR) 80 MG tablet Take 1 tablet (80 mg total) by mouth daily. 30 tablet 11  . benzonatate (TESSALON) 100 MG capsule Take 1 capsule (100 mg total) by mouth 3 (three) times daily as needed for cough. 20 capsule 0  . carvedilol (COREG) 6.25 MG tablet Take 1 tablet (6.25 mg total) by mouth 2 (two) times daily with a meal. 180 tablet 3  . famotidine (PEPCID) 20 MG tablet 2 tabs by mouth at bedtime. 60 tablet 11  . Ferrous Gluconate 324 (37.5 Fe) MG TABS 1 tab by mouth twice daily with half and orange    . fluticasone (FLONASE) 50 MCG/ACT nasal spray Place 2 sprays into both nostrils daily. 16 g 11  . glimepiride (AMARYL) 2 MG tablet Take 1 tablet (2 mg total) by mouth daily with breakfast. 30 tablet 3  . isosorbide mononitrate (IMDUR) 30 MG 24 hr tablet Take 1 tablet (30 mg total) by mouth daily. (Patient taking differently: Take 15 mg by mouth 2 (two)  times daily. ) 90 tablet 3  . loratadine (CLARITIN) 10 MG tablet Take 1 tablet (10 mg total) by mouth daily. (Patient taking differently: Take 10 mg by mouth daily as needed for allergies. ) 60 tablet 2  . nitroGLYCERIN (NITROSTAT) 0.4 MG SL tablet Place 1 tablet (0.4 mg total) under the tongue every 5 (five) minutes x 3 doses as needed for chest pain. 25 tablet 0  . sertraline (ZOLOFT) 50 MG tablet Take 0.5 tablets (25 mg total) by mouth at bedtime. 15 tablet 11  . ticagrelor (BRILINTA) 90 MG TABS tablet Take 1 tablet (90 mg total) by mouth 2 (two) times daily. 60 tablet 5  . furosemide (LASIX) 40 MG tablet 1 tab by mouth daily in morning 60 tablet 2  . sitaGLIPtin (JANUVIA) 100 MG tablet 1/2 tab by mouth once daily with morning meal. (Patient not taking: Reported on 04/19/2018) 30 tablet 11  No facility-administered medications prior to visit.      Allergies:   Patient has no known allergies.   Social History   Socioeconomic History  . Marital status: Widowed    Spouse name: Not on file  . Number of children: 8  . Years of education: 80  . Highest education level: Not on file  Occupational History  . Not on file  Social Needs  . Financial resource strain: Not on file  . Food insecurity:    Worry: Not on file    Inability: Not on file  . Transportation needs:    Medical: Not on file    Non-medical: Not on file  Tobacco Use  . Smoking status: Never Smoker  . Smokeless tobacco: Never Used  Substance and Sexual Activity  . Alcohol use: Never    Frequency: Never  . Drug use: Never  . Sexual activity: Not Currently  Lifestyle  . Physical activity:    Days per week: Not on file    Minutes per session: Not on file  . Stress: Not on file  Relationships  . Social connections:    Talks on phone: Not on file    Gets together: Not on file    Attends religious service: Not on file    Active member of club or organization: Not on file    Attends meetings of clubs or  organizations: Not on file    Relationship status: Not on file  Other Topics Concern  . Not on file  Social History Narrative   Living with daughter, Sheila White and her husband and her 3 sons.     Family History:  The patient's family history includes Alcohol abuse in her father.   ROS General: Negative; No fevers, chills, or night sweats;  HEENT: Negative; No changes in vision or hearing, sinus congestion, difficulty swallowing Pulmonary: Negative; No cough, wheezing, shortness of breath, hemoptysis Cardiovascular: See HPI GI: Obvious nausea, resolved Musculoskeletal: Negative; no myalgias, joint pain, or weakness Hematologic/Oncology: Negative; no easy bruising, bleeding Endocrine: Negative; no heat/cold intolerance; no diabetes Neuro: Negative; no changes in balance, headaches; previous dizziness resolved Skin: Negative; No rashes or skin lesions Psychiatric: Negative; No behavioral problems, depression Sleep: Negative; No snoring, daytime sleepiness, hypersomnolence, bruxism, restless legs, hypnogognic hallucinations, no cataplexy Other comprehensive 14 point system review is negative.   PHYSICAL EXAM:   VS:  BP 114/60 (BP Location: Left Arm, Patient Position: Sitting, Cuff Size: Normal)   Pulse 62   Ht _0  (1.499 m)   Wt 106 lb (48.1 kg)   BMI 21.41 kg/m     Repeat blood pressure by me was 124/60 supine and 118/64 standing.  Wt Readings from Last 3 Encounters:  07/27/18 106 lb (48.1 kg)  06/04/18 109 lb 8 oz (49.7 kg)  05/30/18 119 lb (54 kg)     General: Alert, oriented, no distress.  Skin: normal turgor, no rashes, warm and dry HEENT: Normocephalic, atraumatic. Pupils equal round and reactive to light; sclera anicteric; extraocular muscles intact; Nose without nasal septal hypertrophy Mouth/Parynx benign; Mallinpatti scale 3 Neck: No JVD, no carotid bruits; normal carotid upstroke Lungs: clear to ausculatation and percussion; no wheezing or rales Chest wall:  without tenderness to palpitation Heart: PMI not displaced, RRR, s1 s2 normal, 1/6 systolic murmur, no diastolic murmur, no rubs, gallops, thrills, or heaves Abdomen: soft, nontender; no hepatosplenomehaly, BS+; abdominal aorta nontender and not dilated by palpation. Back: no CVA tenderness Pulses 2+ Musculoskeletal: full range of motion,  normal strength, no joint deformities Extremities: no clubbing cyanosis or edema, Homan's sign negative  Neurologic: grossly nonfocal; Cranial nerves grossly wnl Psychologic: Normal mood and affect   Studies/Labs Reviewed:   EKG:  EKG is ordered today.  ECG (independently read by me): Normal sinus rhythm at 62 bpm.  Old inferior MI and anterior MI.  Left axis deviation.  No ectopy.  QTc interval 430 ms.  March 27, 2018 ECG (independently read by me): Normal sinus rhythm at 78 bpm.  Old anterior MI with QS V1 through V5 and inferior Q waves.  No ectopy.  December 20, 2017 ECG (independently read by me): Normal sinus rhythm 70 bpm.  Old anterior MI with poor progression V1 through V5 with Q waves noted in 3 and aVF.  Normal intervals.  No ectopy.  11/18/2017 ECG (independently read by me): Sinus rhythm at 86 bpm.  Left anterior hemiblock.  QS complex V1 through V5 consistent with anterior infarct.  Inferior Q waves suggestive of inferior MI.  Normal intervals.  No ectopy.  Recent Labs: BMP Latest Ref Rng & Units 06/04/2018 06/03/2018 06/02/2018  Glucose 70 - 99 mg/dL 137(H) 122(H) 120(H)  BUN 8 - 23 mg/dL 26(H) 26(H) 22  Creatinine 0.44 - 1.00 mg/dL 1.48(H) 1.61(H) 1.45(H)  BUN/Creat Ratio 12 - 28 - - -  Sodium 135 - 145 mmol/L 134(L) 136 135  Potassium 3.5 - 5.1 mmol/L 3.6 3.6 3.9  Chloride 98 - 111 mmol/L 94(L) 95(L) 96(L)  CO2 22 - 32 mmol/L _0 Calcium 8.9 - 10.3 mg/dL 8.6(L) 8.9 8.9     Hepatic Function Latest Ref Rng & Units 06/04/2018 05/30/2018 01/04/2018  Total Protein 6.5 - 8.1 g/dL 6.1(L) 5.8(L) 6.4  Albumin 3.5 - 5.0 g/dL 3.2(L) 3.6 4.0   AST 15 - 41 U/L 29 36 17  ALT 0 - 44 U/L 27 45(H) 21  Alk Phosphatase 38 - 126 U/L 69 90 70  Total Bilirubin 0.3 - 1.2 mg/dL 0.9 0.5 0.3  Bilirubin, Direct 0.1 - 0.5 mg/dL - - -    CBC Latest Ref Rng & Units 06/03/2018 06/02/2018 06/01/2018  WBC 4.0 - 10.5 K/uL 6.9 6.9 9.1  Hemoglobin 12.0 - 15.0 g/dL 11.6(L) 11.8(L) 11.9(L)  Hematocrit 36.0 - 46.0 % 36.2 37.8 37.3  Platelets 150 - 400 K/uL 228 239 235   Lab Results  Component Value Date   MCV 84.8 06/03/2018   MCV 84.8 06/02/2018   MCV 85.4 06/01/2018   Lab Results  Component Value Date   TSH 2.617 11/20/2017   Lab Results  Component Value Date   HGBA1C 7.2 (H) 04/19/2018     BNP    Component Value Date/Time   BNP 3,381.1 (H) 06/04/2018 0754    ProBNP    Component Value Date/Time   PROBNP 20,354 (H) 12/01/2017 1041     Lipid Panel     Component Value Date/Time   CHOL 136 01/23/2018 0947   TRIG 85 01/23/2018 0947   HDL 54 01/23/2018 0947   CHOLHDL 3.3 10/06/2017 0256   VLDL 22 10/06/2017 0256   LDLCALC 65 01/23/2018 0947     RADIOLOGY: No results found.   Additional studies/ records that were reviewed today include:  I reviewed her 3-week hospitalization records in detail.  I reviewed her follow-up office visit with Sheila White.  Laboratory catheterization studies, echo Doppler assessment were reviewed.  I reviewed her hospitalization, and follow-up office visit since my last evaluation.   ASSESSMENT:  1. Essential hypertension   2. Chronic combined systolic (congestive) and diastolic (congestive) heart failure ([place])   3. ST elevation myocardial infarction (STEMI), subsequent episode of care [facility]); April 2019   4. Ischemic cardiomyopathy   5. Hyperlipidemia with target LDL less than 70     PLAN:   Ms. Sheila White is a very pleasant 78 year-old Hispanic female who presented with a late presentation anterior ST segment elevation MI.  She had an initial significant ischemic cardiomyopathy  with an EF of 30% with grade 2 diastolic dysfunction and developed post infarct pericarditis treated with Decadron and colchicine and during her hospitalization and developed significant hyponatremia, acute kidney injury, with ultimate improvement.  Her creatinine has improved.   She has had issues with low blood pressure and was not on an ACE or ARB therapy due to previous renal insufficiency and low blood pressure.  Remotely, she had been started on hydralazine but this had to be discontinued due to low blood pressure.  Her last  follow-up echo still shows an EF of 30 to 35% and is concordant with her late presentation anterior MI.  Renal function has now fairly well stabilized with creatinines in the 1.4 range.  Her resting pulse is 62 on carvedilol 6.25 mg twice a day.  She continues to be on isosorbide 30 mg daily and is not having anginal symptoms.  Presently I would like to try initiating low-dose Entresto at 24/26 mg twice a day in light of her continued LV dysfunction.  I will decrease her Lasix to 20 mg every other day.  At present there are no signs of volume overload.  I provided her with samples today.  In 2 weeks she will undergo a be met, proBNP level and she will follow-up with Sheila White our Pharm D for reassessment.  Hopefully renal function will remain stable and she may ultimately be able to be titrated to higher dosing of Entresto.  Most recent lipid studies revealed an LDL of 65 on atorvastatin.  She is diabetic on glimepiride and if further therapy is needed with her reduced LV function SGLT2 inhibition would be beneficial both for diabetes and potential heart failure.  I will see her in 6 to 8 weeks for reevaluation.  Time spent: 25 minutes  Medication Adjustments/Labs and Tests Ordered: Current medicines are reviewed at length with the patient today.  Concerns regarding medicines are outlined above.  Medication changes, Labs and Tests ordered today are listed in the Patient Instructions  below. Patient Instructions  Medication Instructions:  Start Entresto 24/26 Change Lasix to 20 mg every other day. If you need a refill on your cardiac medications before your next appointment, please call your pharmacy.   Lab work: In 10 days, have a BMET, Pro BNP, and CBC If you have labs (blood work) drawn today and your tests are completely normal, you will receive your results only by: Marland Kitchen MyChart Message (if you have MyChart) OR . A paper copy in the mail If you have any lab test that is abnormal or we need to change your treatment, we will call you to review the results.   Follow-Up: At [facility], you and your health needs are our priority.  As part of our continuing mission to provide you with exceptional heart care, we have created designated Provider Care Teams.  These Care Teams include your primary Cardiologist (physician) and Advanced Practice Providers (APPs -  Physician Assistants and Nurse Practitioners) who all work together to provide  you with the care you need, when you need it. Follow up with Pharmacist in 2 weeks. You will need a follow up appointment in 2 months. You may see Shelva Majestic, MD or one of the following Advanced Practice Providers on your designated Care Team: Sheila White, Sheila White . Fabian Sharp, PA-C        Signed, Shelva Majestic, MD  07/29/2018 2:01 [facility] [address], [place], [place], [place]  [address] Phone: [phone]

## 2018-07-28 ENCOUNTER — Telehealth: Payer: Self-pay

## 2018-07-29 ENCOUNTER — Encounter: Payer: Self-pay | Admitting: Cardiovascular Disease

## 2018-08-04 NOTE — Telephone Encounter (Signed)
Called pt to follow up on rx assistance referral. Would like to provide contact information for financial counselor for [facility]. Pt will be able to apply for blue card. Left vm

## 2018-08-08 LAB — BASIC METABOLIC PANEL
BUN/Creatinine Ratio: 33 — ABNORMAL HIGH (ref 12–28)
BUN: 45 mg/dL — ABNORMAL HIGH (ref 8–27)
CALCIUM: 9.1 mg/dL (ref 8.7–10.3)
CO2: 21 mmol/L (ref 20–29)
Chloride: 96 mmol/L (ref 96–106)
Creatinine, Ser: 1.38 mg/dL — ABNORMAL HIGH (ref 0.57–1.00)
GFR calc Af Amer: 42 mL/min/{1.73_m2} — ABNORMAL LOW (ref >59)
GFR calc non Af Amer: 36 mL/min/{1.73_m2} — ABNORMAL LOW (ref >59)
Glucose: 145 mg/dL — ABNORMAL HIGH (ref 65–99)
Potassium: 4.6 mmol/L (ref 3.5–5.2)
Sodium: 135 mmol/L (ref 134–144)

## 2018-08-08 LAB — CBC
Hematocrit: 41.3 % (ref 34.0–46.6)
Hemoglobin: 13.2 g/dL (ref 11.1–15.9)
MCH: 27.2 pg (ref 26.6–33.0)
MCHC: 32 g/dL (ref 31.5–35.7)
MCV: 85 fL (ref 79–97)
Platelets: 221 10*3/uL (ref 150–450)
RBC: 4.86 x10E6/uL (ref 3.77–5.28)
RDW: 14.5 % (ref 11.7–15.4)
WBC: 5.4 10*3/uL (ref 3.4–10.8)

## 2018-08-08 LAB — PRO B NATRIURETIC PEPTIDE: NT-Pro BNP: 5280 pg/mL — ABNORMAL HIGH (ref 0–738)

## 2018-08-10 ENCOUNTER — Ambulatory Visit (INDEPENDENT_AMBULATORY_CARE_PROVIDER_SITE_OTHER): Payer: Self-pay | Admitting: Pharmacist

## 2018-08-10 VITALS — BP 90/62 | HR 64 | Wt 104.2 lb

## 2018-08-10 DIAGNOSIS — I5023 Acute on chronic systolic (congestive) heart failure: Secondary | ICD-10-CM

## 2018-08-10 MED ORDER — FUROSEMIDE 20 MG PO TABS: 20.0000 mg | ORAL_TABLET | Freq: Every day | ORAL | 1 refills | Status: DC | PRN

## 2018-08-10 MED ORDER — TICAGRELOR 90 MG PO TABS: 90.0000 mg | ORAL_TABLET | Freq: Two times a day (BID) | ORAL | 5 refills | Status: DC

## 2018-08-10 MED ORDER — SACUBITRIL-VALSARTAN 24-26 MG PO TABS: 1.0000 | ORAL_TABLET | Freq: Two times a day (BID) | ORAL | 3 refills | Status: DC

## 2018-08-10 NOTE — Progress Notes (Signed)
Patient ID: Sheila White                 DOB: Mar 29, 1941                      MRN: [id number]     HPI: Sheila White is a 78 y.o. female referred by Dr. Tresa Endo to phamacist clinic for [facility] titration. PMH includes STEMI, systolic and diastolic HF with EF of 30% (NYHA calls 3), hypertension, and DM-II.  We are working with [facility] to get the "blue card" for this patient. She will need financila assistance with Sherryll Burger and Brilinta prescriptions.  Patient presents to clinic accompany by her daughter and reports feeling well. Daughter denies changes in health, appetite or physical activity. Only complain is frequent urination due to diuretic usage. Noted BMET shows stable renal function and electrolytes, but patient experienced unexpected 2lb weight lost  Current HTN meds:  Carvedilol 6.25mg  twice daily Entresto 24/26mg  twice daily Furosemide 20mg  every other day  Intolerance: none  BP goal: <130/80  Family History: The patient's family history includes Alcohol abuse in her father.   Social History: denies alcohol, tobacco or illicit drugs  Diet: mainly low sodium home cooked meals  Exercise: activities of daily living  Home BP readings: none provided. No BP cuff at home  Wt Readings from Last 3 Encounters:  08/10/18 104 lb 3.2 oz (47.3 kg)  07/27/18 106 lb (48.1 kg)  06/04/18 109 lb 8 oz (49.7 kg)   BP Readings from Last 3 Encounters:  08/10/18 90/62  07/27/18 114/60  06/04/18 137/80   Pulse Readings from Last 3 Encounters:  08/10/18 64  07/27/18 62  06/04/18 68    Past Medical History:  Diagnosis Date  . Acute combined systolic and diastolic CHF, NYHA class 3 (H[facility]) 10/10/2017   in setting of MI  . Acute cystitis without hematuria   . Acute on chronic systolic congestive heart failure ([facility]) 10/20/2017  . Anxiety and depression 12/19/2017  . Chest pain 10/31/2017  . Chest pain, rule out acute myocardial infarction 11/19/2017  .  Coronary artery disease 10/31/2017  . Dyspnea 10/05/2017  . Heart failure ([facility]) 12/23/2017  . HOH (hard of hearing)   . Ischemic cardiomyopathy 10/05/2017  . MI, acute, non ST segment elevation ([facility])   . Non-insulin dependent type 2 diabetes mellitus ([facility]) 2004  . 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 STEMI involving left anterior descending coronary artery ([facility]) 10/05/2017    Current Outpatient Medications on File Prior to Visit  Medication Sig Dispense Refill  . aspirin 81 MG chewable tablet Chew 1 tablet (81 mg total) by mouth daily. 60 tablet 2  . atorvastatin (LIPITOR) 80 MG tablet Take 1 tablet (80 mg total) by mouth daily. 30 tablet 11  . benzonatate (TESSALON) 100 MG capsule Take 1 capsule (100 mg total) by mouth 3 (three) times daily as needed for cough. 20 capsule 0  . carvedilol (COREG) 6.25 MG tablet Take 1 tablet (6.25 mg total) by mouth 2 (two) times daily with a meal. 180 tablet 3  . famotidine (PEPCID) 20 MG tablet 2 tabs by mouth at bedtime. 60 tablet 11  . Ferrous Gluconate 324 (37.5 Fe) MG TABS 1 tab by mouth twice daily with half and orange    . fluticasone (FLONASE) 50 MCG/ACT nasal spray Place 2 sprays into both nostrils daily. 16 g 11  . glimepiride (AMARYL) 2 MG tablet Take 1  tablet (2 mg total) by mouth daily with breakfast. 30 tablet 3  . isosorbide mononitrate (IMDUR) 30 MG 24 hr tablet Take 1 tablet (30 mg total) by mouth daily. (Patient taking differently: Take 15 mg by mouth 2 (two) times daily. ) 90 tablet 3  . loratadine (CLARITIN) 10 MG tablet Take 1 tablet (10 mg total) by mouth daily. (Patient taking differently: Take 10 mg by mouth daily as needed for allergies. ) 60 tablet 2  . nitroGLYCERIN (NITROSTAT) 0.4 MG SL tablet Place 1 tablet (0.4 mg total) under the tongue every 5 (five) minutes x 3 doses as needed for chest pain. 25 tablet 0  . sertraline (ZOLOFT) 50 MG tablet Take 0.5 tablets (25 mg total) by mouth at bedtime. 15 tablet 11   No current  facility-administered medications on file prior to visit.     No Known Allergies  Blood pressure 90/62, pulse 64, weight 104 lb 3.2 oz (47.3 kg).  Acute on chronic systolic congestive heart failure (HCC) BP today is too low for further medication titration. Noted recent BMEt shows stable (slightly improved) renal function and electrolytes. Will stop daily furosemide and change to furosemide 20mg  daily PRN for fluid retention. Continue Entresto at 24/26mg  twice daily and carvedilol at 6.25mg  twice daily.  Our Healthcare coordinator continue to work with [facility] to optain the "blue card" and provide coverage for [facility] therapy. Two weeks samples of Entresto and Brilinta were provided today to assist patient until paperwork with community health is completed.   Weslynn Ke Rodriguez-Guzman PharmD, BCPS, CPP [facility] [address] [place] [address] 08/11/2018 10:31 AM

## 2018-08-10 NOTE — Patient Instructions (Addendum)
*   Continue Entresto 24/26mg  dos veces al dia* * Tome furosemide 20mg  solo si tiene retencion de fluidos  Estos signos y sntomas significan que su afeccin podra estar empeorando y que debe hacer algunos cambios:  Tiene dificultad para respirar cuando est activo o necesita una mayor cantidad de almohadas para dormir.  Tiene hinchazn en las piernas o el abdomen.  Aumenta de 2 a 3lb (0,9 a 1,4kg) por da o 5lb (2,3kg) en una sola semana. Esta cantidad podra ser mayor o menor segn su afeccin.  Se cansa con facilidad.  Tiene dificultad para dormir.  Tiene tos seca. Si presenta alguno de estos sntomas:  Comunquese con Sports coach siguiente.  El mdico podra ajustarle los medicamentos.

## 2018-08-11 ENCOUNTER — Encounter: Payer: Self-pay | Admitting: Pharmacist

## 2018-08-11 NOTE — Assessment & Plan Note (Signed)
BP today is too low for further medication titration. Noted recent BMEt shows stable (slightly improved) renal function and electrolytes. Will stop daily furosemide and change to furosemide 20mg  daily PRN for fluid retention. Continue Entresto at 24/26mg  twice daily and carvedilol at 6.25mg  twice daily.  Our Healthcare coordinator continue to work with [facility] to optain the "blue card" and provide coverage for [facility] therapy. Two weeks samples of Entresto and Brilinta were provided today to assist patient until paperwork with community health is completed.

## 2018-08-15 ENCOUNTER — Telehealth: Payer: Self-pay

## 2018-08-15 NOTE — Telephone Encounter (Signed)
Please advise,  I received notification that this patient is not a legal [place] resident, and to qualify for the patient assistance for Brilinta, she has to be a [place] resident, have a green card, or work VISA. They are asking to consider Eliquis or patient may purchase generic Plavix from the health department.  Anyway to assist with this? Or ask Dr.Kelly? Thanks!

## 2018-08-15 NOTE — Telephone Encounter (Signed)
We were working with Garry Heater to get blue card if possible. Will need to check with DR Tresa Endo prior to changing Brilinta to Plavix.   She is also on Entresto 24-26mg  twice daily.

## 2018-08-16 ENCOUNTER — Telehealth: Payer: Self-pay

## 2018-08-16 NOTE — Telephone Encounter (Signed)
Pt called back to inform care guide of blue card coverage. Unable to use outside of community health and wellness. Care guide will follow up with other resources.  10:32: Calling to provide info on [place] [facility]. Left Message

## 2018-08-16 NOTE — Telephone Encounter (Signed)
Was able to communicate with patient through daughter, [name]. Provided information for financial counselor at [employer]. Will apply for blue card. Provided care guide contact for further assistance.

## 2018-08-23 ENCOUNTER — Encounter: Payer: Self-pay | Admitting: Internal Medicine

## 2018-08-23 ENCOUNTER — Ambulatory Visit: Payer: Self-pay | Admitting: Internal Medicine

## 2018-08-23 VITALS — BP 136/78 | HR 64 | Resp 12 | Ht <= 58 in | Wt 110.0 lb

## 2018-08-23 DIAGNOSIS — Z0000 Encounter for general adult medical examination without abnormal findings: Secondary | ICD-10-CM

## 2018-08-23 MED ORDER — CARVEDILOL 6.25 MG PO TABS: 6.2500 mg | ORAL_TABLET | Freq: Two times a day (BID) | ORAL | 3 refills | Status: DC

## 2018-08-23 MED ORDER — NITROGLYCERIN 0.4 MG SL SUBL: 0.4000 mg | SUBLINGUAL_TABLET | SUBLINGUAL | 2 refills | Status: DC | PRN

## 2018-08-23 MED ORDER — TRIAMCINOLONE ACETONIDE 0.1 % EX CREA: 1.0000 "application " | TOPICAL_CREAM | Freq: Two times a day (BID) | CUTANEOUS | 1 refills | Status: DC

## 2018-08-23 MED ORDER — SACUBITRIL-VALSARTAN 24-26 MG PO TABS: 1.0000 | ORAL_TABLET | Freq: Two times a day (BID) | ORAL | 11 refills | Status: DC

## 2018-08-23 MED ORDER — ISOSORBIDE MONONITRATE ER 30 MG PO TB24: ORAL_TABLET | ORAL | 11 refills | Status: DC

## 2018-08-23 NOTE — Progress Notes (Addendum)
Subjective:    Patient ID: Sheila White, female   DOB: 02/21/1941, 78 y.o.   MRN: [id number]   HPI   CPE without pap  1.  Pap:  Last was 10 years ago.  Always normal.  No family history of cervical cancer.  2.  Mammogram:  Last was in 2006 and normal.  Discussed with screening, if she would do anything if concerning findings for breast cancer.  She is not interested in obtaining mammogram with her current health issues.    3.  Osteoprevention:  She does like almond milk, but only drinks one serving daily.  Unsweetened.  Would be fine with 3 servings daily.  Limited physical activity currently.  Some walking outside the house.    4.  Guaiac Cards:  Did perform in 09/2017 and negative.  5.  Colonoscopy:  Never.  No family history of colon cancer.  She agrees she is not a good candidate for colon cancer screen with her cardiac status.  Discussed she would also need to stop ASA and Brilinta for the procedure, which we should not do at this time.    6.  Immunizations:  Immunization History  Administered Date(s) Administered  . Influenza Split 04/28/2018  . Tdap 04/19/2018   Has not had Pneumo 13 or 23.  We do not have available currently. I was unable to find any documentation of Pneumo 23 v from her hospitalization in April of last year or any hospitalizations since.  Appears she was discharged in April before it was given.   7.  Glucose/Cholesterol:  Last A1C 04/19/18 was 7.2%.  Last FLP was at goal 01/23/18: Lipid Panel     Component Value Date/Time   CHOL 136 01/23/2018 0947   TRIG 85 01/23/2018 0947   HDL 54 01/23/2018 0947   CHOLHDL 3.3 10/06/2017 0256   VLDL 22 10/06/2017 0256   LDLCALC 65 01/23/2018 0947     Current Meds  Medication Sig  . aspirin 81 MG chewable tablet Chew 1 tablet (81 mg total) by mouth daily.  Marland Kitchen atorvastatin (LIPITOR) 80 MG tablet Take 1 tablet (80 mg total) by mouth daily.  . famotidine (PEPCID) 20 MG tablet 2 tabs by mouth at  bedtime.  . Ferrous Gluconate 324 (37.5 Fe) MG TABS 1 tab by mouth twice daily with half and orange  . glimepiride (AMARYL) 2 MG tablet Take 1 tablet (2 mg total) by mouth daily with breakfast.  . isosorbide mononitrate (IMDUR) 30 MG 24 hr tablet Take 1 tablet (30 mg total) by mouth daily. (Patient taking differently: Take 15 mg by mouth 2 (two) times daily. )  . loratadine (CLARITIN) 10 MG tablet Take 1 tablet (10 mg total) by mouth daily. (Patient taking differently: Take 10 mg by mouth daily as needed for allergies. )  . nitroGLYCERIN (NITROSTAT) 0.4 MG SL tablet Place 1 tablet (0.4 mg total) under the tongue every 5 (five) minutes x 3 doses as needed for chest pain.  . sacubitril-valsartan (ENTRESTO) 24-26 MG Take 1 tablet by mouth 2 (two) times daily.  . sertraline (ZOLOFT) 50 MG tablet Take 0.5 tablets (25 mg total) by mouth at bedtime.  . ticagrelor (BRILINTA) 90 MG TABS tablet Take 1 tablet (90 mg total) by mouth 2 (two) times daily.   No Known Allergies   Past Medical History:  Diagnosis Date  . Acute combined systolic and diastolic CHF, NYHA class 3 (HCC) 10/10/2017   in setting of MI  .  Acute cystitis without hematuria   . Acute on chronic systolic congestive heart failure (HCC) 10/20/2017  . Anxiety and depression 12/19/2017  . Chest pain 10/31/2017  . Chest pain, rule out acute myocardial infarction 11/19/2017  . Coronary artery disease 10/31/2017  . Dyspnea 10/05/2017  . Heart failure (HCC) 12/23/2017  . HOH (hard of hearing)   . Ischemic cardiomyopathy 10/05/2017  . MI, acute, non ST segment elevation (HCC)   . Non-insulin dependent type 2 diabetes mellitus (HCC) 2004  . 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 STEMI involving left anterior descending coronary artery (HCC) 10/05/2017    Past Surgical History:  Procedure Laterality Date  . CORONARY/GRAFT ACUTE MI REVASCULARIZATION N/A 10/05/2017   Procedure: Coronary/Graft Acute MI Revascularization;  Surgeon: Lennette Bihari, MD;  [facility];  Service: Cardiovascular;  Laterality: N/A;  . LEFT HEART CATH AND CORONARY ANGIOGRAPHY N/A 10/05/2017   Procedure: LEFT HEART CATH AND CORONARY ANGIOGRAPHY;  Surgeon: Lennette Bihari, MD;  Location: [facility] INVASIVE CV LAB;  Service: Cardiovascular;  Laterality: N/A;    Family History  Problem Relation Age of Onset  . Alcohol abuse Father     Social History   Socioeconomic History  . Marital status: Widowed    Spouse name: Not on file  . Number of children: 8  . Years of education: 65  . Highest education level: Not on file  Occupational History  . Occupation: Orthoptist, retired  Engineer, production  . Financial resource strain: Not on file  . Food insecurity:    Worry: Never true    Inability: Never true  . Transportation needs:    Medical: No    Non-medical: No  Tobacco Use  . Smoking status: Never Smoker  . Smokeless tobacco: Never Used  Substance and Sexual Activity  . Alcohol use: Never    Frequency: Never  . Drug use: Never  . Sexual activity: Not Currently  Lifestyle  . Physical activity:    Days per week: Not on file    Minutes per session: Not on file  . Stress: Not on file  Relationships  . Social connections:    Talks on phone: More than three times a week    Gets together: Not on file    Attends religious service: 1 to 4 times per year    Active member of club or organization: No    Attends meetings of clubs or organizations: Never    Relationship status: Not on file  . Intimate partner violence:    Fear of current or ex partner: Not on file    Emotionally abused: Not on file    Physically abused: Not on file    Forced sexual activity: Not on file  Other Topics Concern  . Not on file  Social History Narrative   Living with daughter, Sheila White and her husband and her 3 sons.      Review of Systems  Constitutional: Negative for activity change (Energy better since med change to [facility] fatigue and unexpected  weight change.  HENT: Negative for dental problem and hearing loss. Congestion: Dentures.   Eyes: Positive for visual disturbance (Waiting for voucher --just gave Rx to front desk.).  Respiratory: Negative for cough and shortness of breath.   Cardiovascular: Negative for chest pain, palpitations and leg swelling.  Gastrointestinal: Negative for abdominal pain, constipation and diarrhea.  Genitourinary: Negative for dysuria and frequency.  Musculoskeletal: Negative for arthralgias.  Skin: Positive for rash (pruritic on on  inside of knees.  Red.  Has had for 1 week and started on lower buttock.  She started [facility] Jan 30th.  No other new exposures.  ).  Neurological: Negative for weakness, light-headedness and numbness.  Psychiatric/Behavioral: Negative for dysphoric mood. The patient is not nervous/anxious.       Objective:   BP 136/78 (BP Location: Left Arm, Patient Position: Sitting, Cuff Size: Normal)   Pulse 64   Resp 12   Ht 4\' 10"  (1.473 m)   Wt 110 lb (49.9 kg)   SpO2 96%   BMI 22.99 kg/m   Physical Exam  Constitutional: She is oriented to person, place, and time. She appears well-developed and well-nourished.  HENT:  Head: Normocephalic and atraumatic.  Right Ear: Tympanic membrane, external ear and ear canal normal.  Left Ear: Tympanic membrane, external ear and ear canal normal.  Nose: Nose normal.  Mouth/Throat: Uvula is midline, oropharynx is clear and moist and mucous membranes are normal.  Eyes: Pupils are equal, round, and reactive to light. Conjunctivae and EOM are normal.  Unable to see discs well.  Neck: Normal range of motion and full passive range of motion without pain. Neck supple. No thyroid mass and no thyromegaly present.  Cardiovascular: Normal rate, regular rhythm, S1 normal and S2 normal. Exam reveals no S3, no S4 and no friction rub.  No murmur heard. No carotid bruits.  Carotid, radial, femoral pulses normal and equal.  DT and PT pulses decreased  bilaterally with LE skin changes.  Pulmonary/Chest: Effort normal and breath sounds normal. Right breast exhibits no inverted nipple, no mass, no nipple discharge and no skin change. Left breast exhibits no inverted nipple, no mass, no nipple discharge and no skin change.  Abdominal: Soft. Bowel sounds are normal. She exhibits no mass. There is no hepatosplenomegaly. There is no abdominal tenderness. No hernia.  Genitourinary:    Genitourinary Comments: Exam deferred   Musculoskeletal: Normal range of motion.  Lymphadenopathy:       Head (right side): No submental and no submandibular adenopathy present.       Head (left side): No submental and no submandibular adenopathy present.    She has no cervical adenopathy.    She has no axillary adenopathy.       Right: No inguinal and no supraclavicular adenopathy present.       Left: No inguinal and no supraclavicular adenopathy present.  Neurological: She is alert and oriented to person, place, and time. She has normal strength and normal reflexes. No sensory deficit. Coordination and gait normal.  Skin: Skin is warm.  Skin of pretibial areas and feet shiny with mild cyanosis of feet particularly when dependent.   Thickened discolored great toenails.  Psychiatric: She has a normal mood and affect. Her speech is normal.     Assessment & Plan  1.  CPE:  GU exam deferred/patient preference as well. Needs Pneumococcal 13 at PHD. Will need Pneumococcal 23 then 1 year later. Fasting labs in 1 week. Encouraged 3 cups of almond milk daily for calcium and vitamin D intake. Declined mammogram.

## 2018-08-23 NOTE — Patient Instructions (Signed)
Call public health department for Pneumococcal 13 and in 1 year Pneumococcal 23 V 3 cups of almond milk, unsweetened daily

## 2018-08-25 ENCOUNTER — Telehealth: Payer: Self-pay

## 2018-08-25 ENCOUNTER — Ambulatory Visit: Payer: Self-pay

## 2018-08-25 NOTE — Telephone Encounter (Signed)
Pt given #28 samples of Entresto 24/26.

## 2018-09-05 ENCOUNTER — Other Ambulatory Visit: Payer: Self-pay

## 2018-09-05 DIAGNOSIS — E119 Type 2 diabetes mellitus without complications: Secondary | ICD-10-CM

## 2018-09-05 DIAGNOSIS — E785 Hyperlipidemia, unspecified: Secondary | ICD-10-CM

## 2018-09-06 LAB — LIPID PANEL W/O CHOL/HDL RATIO
CHOLESTEROL TOTAL: 235 mg/dL — AB (ref 100–199)
HDL: 74 mg/dL (ref >39)
LDL Calculated: 140 mg/dL — ABNORMAL HIGH (ref 0–99)
Triglycerides: 104 mg/dL (ref 0–149)
VLDL Cholesterol Cal: 21 mg/dL (ref 5–40)

## 2018-09-06 LAB — HGB A1C W/O EAG: Hgb A1c MFr Bld: 8.1 % — ABNORMAL HIGH (ref 4.8–5.6)

## 2018-09-08 ENCOUNTER — Other Ambulatory Visit: Payer: Self-pay

## 2018-09-08 MED ORDER — SACUBITRIL-VALSARTAN 24-26 MG PO TABS: 1.0000 | ORAL_TABLET | Freq: Two times a day (BID) | ORAL | 11 refills | Status: DC

## 2018-09-20 ENCOUNTER — Telehealth: Payer: Self-pay

## 2018-09-20 NOTE — Telephone Encounter (Signed)
Pt given #28 samples of Brilinta 90mg .

## 2018-09-20 NOTE — Telephone Encounter (Signed)
Addendum... Pt given #48 samples of Brilinta not #28 as previously noted.

## 2018-09-25 ENCOUNTER — Telehealth: Payer: Self-pay

## 2018-09-25 NOTE — Telephone Encounter (Signed)
   Primary Cardiologist:  Nicki Guadalajara, MD   Patient contacted.  History reviewed.  No symptoms to suggest any unstable cardiac conditions.  Based on discussion, with current pandemic situation, we will be postponing this appointment for Darrick Huntsman with a plan for f/u in 4-6 wks or sooner if feasible/necessary.  If symptoms change, she has been instructed to contact our office.   Routing to C19 CANCEL pool for tracking (P CV DIV CV19 CANCEL - reason for visit "other.") and assigning priority (1 = 4-6 wks, 2 = 6-12 wks, 3 = >12 wks).   Priority 1  Cydney Ok, LPN  9/56/3875 64:33 AM       Attempted to contact patient x3 different times to call to reschedule/or televisit. Unable to reach patient, LVM all times on voicemail.  We will cancel appointment, and place in call back pool.  Marland Kitchen

## 2018-09-26 ENCOUNTER — Ambulatory Visit: Payer: Self-pay | Admitting: Cardiovascular Disease

## 2018-10-03 ENCOUNTER — Telehealth: Payer: Self-pay | Admitting: Physician Assistant

## 2018-10-03 NOTE — Telephone Encounter (Signed)
   TELEPHONE CALL NOTE - RESCHEDULING OPV CANCELLATIONS DUE TO COVID-19  Primary Cardiologist:Thomas Tresa Endo, MD  This patient has been deemed a candidate for a follow-up tele-health visit to limit community exposure during the Covid-19 pandemic.    I have sent a link to the patient so they can set up a my chart.  Please schedule a telehealth visit with Dr. Tresa Endo, [place], or Micah Flesher in the next 2 weeks.  I will forward this appointment request to the appropriate scheduling pool and the primary cardiologist's assist. I will remove this patient from the C19 cancellation pool.    Marcelino Duster, PA 10/03/2018, 4:56 PM

## 2018-10-17 ENCOUNTER — Telehealth (INDEPENDENT_AMBULATORY_CARE_PROVIDER_SITE_OTHER): Payer: Self-pay | Admitting: Physician Assistant

## 2018-10-17 ENCOUNTER — Telehealth: Payer: Self-pay

## 2018-10-17 ENCOUNTER — Encounter: Payer: Self-pay | Admitting: Physician Assistant

## 2018-10-17 ENCOUNTER — Telehealth: Payer: Self-pay | Admitting: Physician Assistant

## 2018-10-17 ENCOUNTER — Other Ambulatory Visit: Payer: Self-pay

## 2018-10-17 DIAGNOSIS — I255 Ischemic cardiomyopathy: Secondary | ICD-10-CM

## 2018-10-17 DIAGNOSIS — Z8673 Personal history of transient ischemic attack (TIA), and cerebral infarction without residual deficits: Secondary | ICD-10-CM

## 2018-10-17 DIAGNOSIS — E119 Type 2 diabetes mellitus without complications: Secondary | ICD-10-CM

## 2018-10-17 DIAGNOSIS — I2510 Atherosclerotic heart disease of native coronary artery without angina pectoris: Secondary | ICD-10-CM

## 2018-10-17 NOTE — Telephone Encounter (Signed)
Called patient's daughter Wray Kearns to begin the telephone visit for her mother Sheila White. Daughter stated that she thought the appointment was at 10:30 and that she wasn't near her mother yet. Ms. Wray Kearns stated that she would like for me to call back at 10:30 and I informed her that 10:30AM would not be a good time that I can just push the time/appointmeAMnt to 12:00PM instead she agreed and I informed her of the consent and that I will be calling back at 11:45AM

## 2018-10-17 NOTE — Telephone Encounter (Signed)
Follow Up:   Pt's daughter will pick up samples today.

## 2018-10-17 NOTE — Progress Notes (Signed)
Virtual Visit via Telephone Note   This visit type was conducted due to national recommendations for restrictions regarding the COVID-19 Pandemic (e.g. social distancing) in an effort to limit this patient's exposure and mitigate transmission in our community.  Due to her co-morbid illnesses, this patient is at least at moderate risk for complications without adequate follow up.  This format is felt to be most appropriate for this patient at this time.  The patient did not have access to video technology/had technical difficulties with video requiring transitioning to audio format only (telephone).  All issues noted in this document were discussed and addressed.  No physical exam could be performed with this format.  Please refer to the patient's chart for her  consent to telehealth [facility].   Evaluation Performed:  Follow-up visit  Date:  10/19/2018   ID:  Sheila White, [place] 1940/11/23, MRN [id number]  Patient Location: Home Provider Location: Home  PCP:  Julieanne MansonMulberry, Elizabeth, MD  Cardiologist:  Nicki Guadalajarahomas Kelly, MD  Electrophysiologist:  None   Chief Complaint:  Followup  History of Present Illness:    Sheila White is a 78 y.o. female with PMH of CAD, ICM, DM II, CVA and CKD. Patient was admitted to the hospital on 10/05/2017 with anterior STEMI with late presentation.  Emergent cardiac catheterization revealed subtotally occluded proximal LAD, 90% mid LAD, 99% apical LAD with reduced apical flow.  She had 50% proximal OM1 followed by a 95% distal OM lesion, mild irregularities in the RCA.  She underwent successful PCI to LAD with resolute DES postdilated to 2.8 mm.  Mid LAD stent was treated with DES.  Apical stenosis was treated with PTCA.  Hospital course was complicated by post infarction pericarditis, acute kidney injury, hyponatremia, abdominal pain, weakness and persistent cough.  Creatinine trended up to 3.86 before improving back down to 1.8 a by  discharge.  Obtained in May 2019 showed EF 30 to 35%, mild distal anterior, apical and inferoapical severe hypokinesis, grade 1 DD.  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  Previous P2Y12 study demonstrated she was not responsive to Plavix, as result, she was placed back on Brilinta.  Last echocardiogram was performed in December 2019, EF remains the same.  Patient was last seen by Dr. Tresa EndoKelly in January 2020, she was doing well at the time.  Her Lasix was decreased to allow the addition of Entresto.  She was last seen in February 2020 by our clinical pharmacist, blood pressure is borderline low at the time.  She was continued on Entresto 24-26 mg twice daily.  Her LDL jumped to 140 on repeat laboratory in March, it appears patient spent some time at her son's house and made some medication at this time.  She has moved back to live with her daughter and is now resumed on all medication.  The patient was contacted today via telephone visit.  I spoke with his daughter.  Patient has resumed all previous medications.  She denies any dizziness, recent chest discomfort or shortness of breath.  She has no heart failure symptoms such as lower extremity edema, orthopnea or PND.  Unfortunately, she only had 1 dose of Brilinta left, I will give her another 2 weeks of samples of Brilinta.  She is a Plavix nonresponder, therefore cannot transition to Plavix at this time.  Since she is roughly 1 year out from her previous PCI, the question  comes down to how long to continue on the Brilinta.  The patient does not have symptoms concerning for COVID-19 infection (fever, chills, cough, or new shortness of breath).    Past Medical History:  Diagnosis Date   Acute combined systolic and diastolic CHF, NYHA class 3 (HCC) 10/10/2017   in setting of MI   Acute cystitis without hematuria    Acute on chronic systolic congestive heart failure (HCC) 10/20/2017   Anxiety and  depression 12/19/2017   Chest pain 10/31/2017   Chest pain, rule out acute myocardial infarction 11/19/2017   Coronary artery disease 10/31/2017   Dyspnea 10/05/2017   Heart failure (HCC) 12/23/2017   HOH (hard of hearing)    Ischemic cardiomyopathy 10/05/2017   MI, acute, non ST segment elevation (HCC)    Non-insulin dependent type 2 diabetes mellitus (HCC) 2004   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   STEMI involving left anterior descending coronary artery (HCC) 10/05/2017   Past Surgical History:  Procedure Laterality Date   CORONARY/GRAFT ACUTE MI REVASCULARIZATION N/A 10/05/2017   Procedure: Coronary/Graft Acute MI Revascularization;  Surgeon: Lennette Bihari, MD;  Location: [facility] INVASIVE CV LAB;  Service: Cardiovascular;  Laterality: N/A;   LEFT HEART CATH AND CORONARY ANGIOGRAPHY N/A 10/05/2017   Procedure: LEFT HEART CATH AND CORONARY ANGIOGRAPHY;  Surgeon: Lennette Bihari, MD;  Location: [facility] INVASIVE CV LAB;  Service: Cardiovascular;  Laterality: N/A;     Current Meds  Medication Sig   aspirin 81 MG chewable tablet Chew 1 tablet (81 mg total) by mouth daily.   atorvastatin (LIPITOR) 80 MG tablet Take 1 tablet (80 mg total) by mouth daily.   carvedilol (COREG) 6.25 MG tablet Take 1 tablet (6.25 mg total) by mouth 2 (two) times daily with a meal for 30 days.   famotidine (PEPCID) 20 MG tablet 2 tabs by mouth at bedtime.   furosemide (LASIX) 20 MG tablet Take 1 tablet (20 mg total) by mouth daily as needed for fluid.   glimepiride (AMARYL) 2 MG tablet Take 1 tablet (2 mg total) by mouth daily with breakfast.   isosorbide mononitrate (IMDUR) 30 MG 24 hr tablet 1/2 tab by mouth twice daily.   loratadine (CLARITIN) 10 MG tablet Take 1 tablet (10 mg total) by mouth daily. (Patient taking differently: Take 10 mg by mouth daily as needed for allergies. )   sacubitril-valsartan (ENTRESTO) 24-26 MG Take 1 tablet by mouth 2 (two) times daily.   sertraline (ZOLOFT) 50  MG tablet Take 0.5 tablets (25 mg total) by mouth at bedtime.   ticagrelor (BRILINTA) 90 MG TABS tablet Take 1 tablet (90 mg total) by mouth 2 (two) times daily.   triamcinolone cream (KENALOG) 0.1 % Apply 1 application topically 2 (two) times daily.     Allergies:   Patient has no known allergies.   Social History   Tobacco Use   Smoking status: Never Smoker   Smokeless tobacco: Never Used  Substance Use Topics   Alcohol use: Never    Frequency: Never   Drug use: Never     Family Hx: The patient's family history includes Alcohol abuse in her father.  ROS:   Please see the history of present illness.     All other systems reviewed and are negative.   Prior CV studies:   The following studies were reviewed today:  Echo 06/03/2018 LV EF: 30% -   35%  ------------------------------------------------------------------- Indications:      Dyspnea 786.09.  ------------------------------------------------------------------- History:   PMH:  Congestive heart failure.  Risk factors:  Chronic kidney disease. Ischemic cardiomyopathy. Hypertension. Diabetes mellitus.  ------------------------------------------------------------------- Study Conclusions  - Left ventricle: The cavity size was normal. Systolic function was   moderately to severely reduced. The estimated ejection fraction   was in the range of 30% to 35%. Severe hypokinesis of the   mid-apicalanteroseptal, anterior, inferoseptal, and apical   myocardium. Doppler parameters are consistent with a reversible   restrictive pattern, indicative of decreased left ventricular   diastolic compliance and/or increased left atrial pressure (grade   3 diastolic dysfunction). Doppler parameters are consistent with   high ventricular filling pressure. - Aortic valve: Transvalvular velocity was within the normal range.   There was no stenosis. There was trivial regurgitation. - Mitral valve: Calcified annulus.  Transvalvular velocity was   within the normal range. There was no evidence for stenosis.   There was trivial regurgitation. - Left atrium: The atrium was severely dilated. - Right ventricle: The cavity size was normal. Wall thickness was   normal. Systolic function was normal. - Right atrium: The atrium was moderately dilated. - Atrial septum: No defect or patent foramen ovale was identified   by color flow Doppler. - Tricuspid valve: There was mild regurgitation. - Pulmonary arteries: Systolic pressure was within the normal   range. PA peak pressure: 33 mm Hg (S). - Pericardium, extracardiac: There was a large left pleural   effusion.  Impressions:  - Compared with the echo 10/2017, no significant changes.  Labs/Other Tests and Data Reviewed:    EKG:  An ECG dated 07/27/2018 was personally reviewed today and demonstrated:  Normal sinus rhythm with poor R wave progression anterior leads.  Recent Labs: 11/20/2017: TSH 2.617 11/22/2017: Magnesium 2.3 06/04/2018: ALT 27; B Natriuretic Peptide 3,381.1 08/07/2018: BUN 45; Creatinine, Ser 1.38; Hemoglobin 13.2; NT-Pro BNP 5,280; Platelets 221; Potassium 4.6; Sodium 135   Recent Lipid Panel Lab Results  Component Value Date/Time   CHOL 235 (H) 09/05/2018 09:50 AM   TRIG 104 09/05/2018 09:50 AM   HDL 74 09/05/2018 09:50 AM   CHOLHDL 3.3 10/06/2017 02:56 AM   LDLCALC 140 (H) 09/05/2018 09:50 AM    Wt Readings from Last 3 Encounters:  08/23/18 110 lb (49.9 kg)  08/10/18 104 lb 3.2 oz (47.3 kg)  07/27/18 106 lb (48.1 kg)     Objective:    Vital Signs:  There were no vitals taken for this visit.   VITAL SIGNS:  reviewed  ASSESSMENT & PLAN:    1. Ischemic cardiomyopathy: Continue carvedilol and Entresto.  Patient will need a repeat echocardiogram on the next follow-up.  2. CAD: Continue aspirin and Brilinta.  She is Plavix nonresponder, currently rely on Brilinta samples.  3. DM2: Managed by primary care provider  4. H/o  CVA: No recent recurrence.   COVID-19 Education: The signs and symptoms of COVID-19 were discussed with the patient and how to seek care for testing (follow up with PCP or arrange E-visit).  The importance of social distancing was discussed today.  Time:   Today, I have spent 12 minutes with the patient with telehealth technology discussing the above problems.     Medication Adjustments/Labs and Tests Ordered: Current medicines are reviewed at length with the patient today.  Concerns regarding medicines are outlined above.   Tests Ordered: No orders of the defined types were placed in this encounter.   Medication Changes: No orders of the defined types were placed in this encounter.   Disposition:  Follow up  in 1 month(s)  Signed, Azalee Course, [place]  10/19/2018 12:03 AM    [facility]

## 2018-10-17 NOTE — Telephone Encounter (Addendum)
Virtual Visit Pre-Appointment Phone Call  Steps For Call:  1. Confirm consent - "In the setting of the current Covid19 crisis, you are scheduled for a Telephone visit with Azalee CourseHao Meng, PA-C on 10/17/2018 at 12:00PM.  Just as we do with many in-office visits, in order for you to participate in this visit, we must obtain consent.  If you'd like, I can send this to your mychart (if signed up) or email for you to review.  Otherwise, I can obtain your verbal consent now.  All virtual visits are billed to your insurance company just like a normal visit would be.  By agreeing to a virtual visit, we'd like you to understand that the technology does not allow for your provider to perform an examination, and thus may limit your provider's ability to fully assess your condition. If your provider identifies any concerns that need to be evaluated in person, we will make arrangements to do so.  Finally, though the technology is pretty good, we cannot assure that it will always work on either your or our end, and in the setting of a video visit, we may have to convert it to a phone-only visit.  In either situation, we cannot ensure that we have a secure connection.  Are you willing to proceed?" STAFF: Did the patient verbally acknowledge consent to telehealth visit? Document YES/NO here: YES  2. Confirm the BEST phone number to call the day of the visit by including in appointment notes  3. Give patient instructions for MyChart download to smartphone OR Doximity/Doxy.me as below if video visit (depending on what platform provider is using)  4. Confirm that appointment type is correct in Epic appointment notes (VIDEO vs PHONE)  5. Advise patient to be prepared with their blood pressure, heart rate, weight, any heart rhythm information, their current medicines, and a piece of paper and pen handy for any instructions they may receive the day of their visit  6. Inform patient they will receive a phone call 15 minutes  prior to their appointment time (may be from unknown caller ID) so they should be prepared to answer    TELEPHONE CALL NOTE  Sheila White has been deemed a candidate for a follow-up tele-health visit to limit community exposure during the Covid-19 pandemic. I spoke with the patient via phone to ensure availability of phone/video source, confirm preferred email & phone number, and discuss instructions and expectations.  I reminded Sheila HuntsmanMaria Santos Umana De White to be prepared with any vital sign and/or heart rhythm information that could potentially be obtained via home monitoring, at the time of her visit. I reminded Sheila HuntsmanMaria Santos Umana De White to expect a phone call prior to her visit.  Dorris Fetcherrah Crew Goren, CMA 10/17/2018 8:58 AM   INSTRUCTIONS FOR DOWNLOADING THE MYCHART APP TO SMARTPHONE  - The patient must first make sure to have activated MyChart and know their login information - If Apple, go to [employer] Store and type in MyChart in the search bar and download the app. If Android, ask patient to go to [employer] Play Store and type in [facility] in the search bar and download the app. The app is free but as with any other app downloads, their phone may require them to verify saved payment information or Apple/Android password.  - The patient will need to then log into the app with their MyChart username and password, and select [facility] as their healthcare provider to link the account. When it  is time for your visit, go to the MyChart app, find appointments, and click Begin Video Visit. Be sure to Select Allow for your device to access the Microphone and Camera for your visit. You will then be connected, and your provider will be with you shortly.  **If they have any issues connecting, or need assistance please contact MyChart service desk [phone])**  **If using a computer, in order to ensure the best quality for their visit they will need to use either of the following  Internet Browsers: [employer] or Google Chrome**  IF USING DOXIMITY or DOXY.ME - The patient will receive a link just prior to their visit by text.     FULL LENGTH CONSENT FOR TELE-HEALTH VISIT   I hereby voluntarily request, consent and authorize [facility] and its employed or contracted physicians, physician assistants, nurse practitioners or other licensed health care professionals (the Practitioner), to provide me with telemedicine health care services (the "Services") as deemed necessary by the treating Practitioner. I acknowledge and consent to receive the Services by the Practitioner via telemedicine. I understand that the telemedicine visit will involve communicating with the Practitioner through live audiovisual communication technology and the disclosure of certain medical information by electronic transmission. I acknowledge that I have been given the opportunity to request an in-person assessment or other available alternative prior to the telemedicine visit and am voluntarily participating in the telemedicine visit.  I understand that I have the right to withhold or withdraw my consent to the use of telemedicine in the course of my care at any time, without affecting my right to future care or treatment, and that the Practitioner or I may terminate the telemedicine visit at any time. I understand that I have the right to inspect all information obtained and/or recorded in the course of the telemedicine visit and may receive copies of available information for a reasonable fee.  I understand that some of the potential risks of receiving the Services via telemedicine include:  Marland Kitchen Delay or interruption in medical evaluation due to technological equipment failure or disruption; . Information transmitted may not be sufficient (e.g. poor resolution of images) to allow for appropriate medical decision making by the Practitioner; and/or  . In rare instances, security protocols could fail,  causing a breach of personal health information.  Furthermore, I acknowledge that it is my responsibility to provide information about my medical history, conditions and care that is complete and accurate to the best of my ability. I acknowledge that Practitioner's advice, recommendations, and/or decision may be based on factors not within their control, such as incomplete or inaccurate data provided by me or distortions of diagnostic images or specimens that may result from electronic transmissions. I understand that the practice of medicine is not an exact science and that Practitioner makes no warranties or guarantees regarding treatment outcomes. I acknowledge that I will receive a copy of this consent concurrently upon execution via email to the email address I last provided but may also request a printed copy by calling the office of [facility].    I understand that my insurance will be billed for this visit.   I have read or had this consent read to me. . I understand the contents of this consent, which adequately explains the benefits and risks of the Services being provided via telemedicine.  . I have been provided ample opportunity to ask questions regarding this consent and the Services and have had my questions answered to my satisfaction. Marland Kitchen  I give my informed consent for the services to be provided through the use of telemedicine in my medical care  By participating in this telemedicine visit I agree to the above.

## 2018-10-17 NOTE — Telephone Encounter (Signed)
Left a detailed voice message for the patient's daughter to pick up the samples for her mother from our office

## 2018-10-17 NOTE — Patient Instructions (Signed)
Medication Instructions:   Your physician recommends that you continue on your current medications as directed. Please refer to the Current Medication list given to you today.  If you need a refill on your cardiac medications before your next appointment, please call your pharmacy.   Lab work:  NONE ordered at this time of appointment   If you have labs (blood work) drawn today and your tests are completely normal, you will receive your results only by: Marland Kitchen MyChart Message (if you have MyChart) OR . A paper copy in the mail If you have any lab test that is abnormal or we need to change your treatment, we will call you to review the results.  Testing/Procedures:  NONE ordered at this time of appointment   Follow-Up: At [facility], you and your health needs are our priority.  As part of our continuing mission to provide you with exceptional heart care, we have created designated Provider Care Teams.  These Care Teams include your primary Cardiologist (physician) and Advanced Practice Providers (APPs -  Physician Assistants and Nurse Practitioners) who all work together to provide you with the care you need, when you need it. You will need a follow up appointment in 4 months.  Please call our office 2 months in advance to schedule this appointment.  You may see Nicki Guadalajara, MD or one of the following Advanced Practice Providers on your designated Care Team: [place], [place] . Micah Flesher, PA-C  Any Other Special Instructions Will Be Listed Below (If Applicable).

## 2018-10-27 IMAGING — CR DG CHEST 2V
2 series · 2 of 2 positions shown · non-contrast
Comparison: PA and lateral chest x-ray October 30, 2017

CLINICAL DATA: Epigastric pain radiating into the chest since
yesterday. Recent MI with coronary stent placement. CHF. Nonsmoker.

EXAM:
CHEST - 2 VIEW

[chest lat]
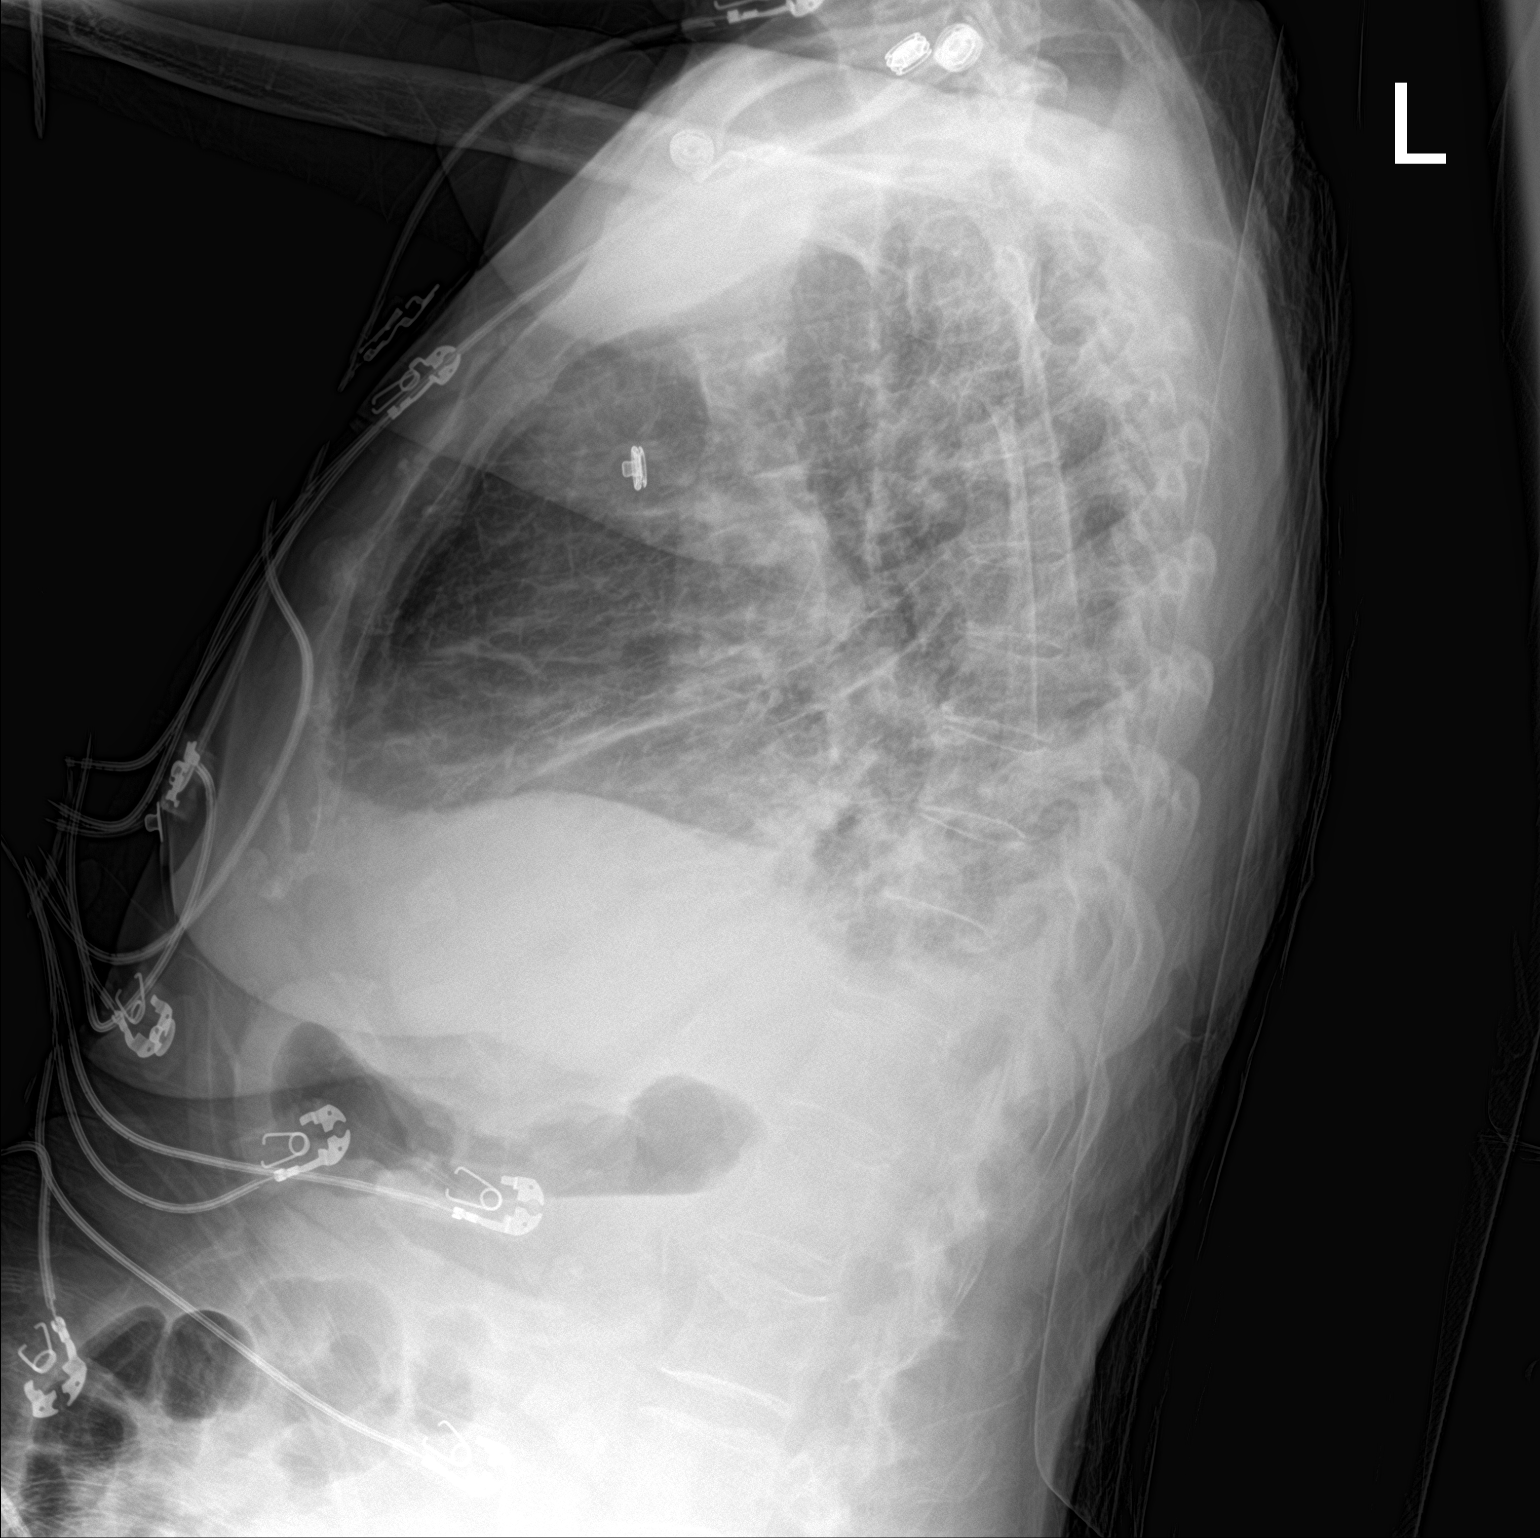

[chest ap]
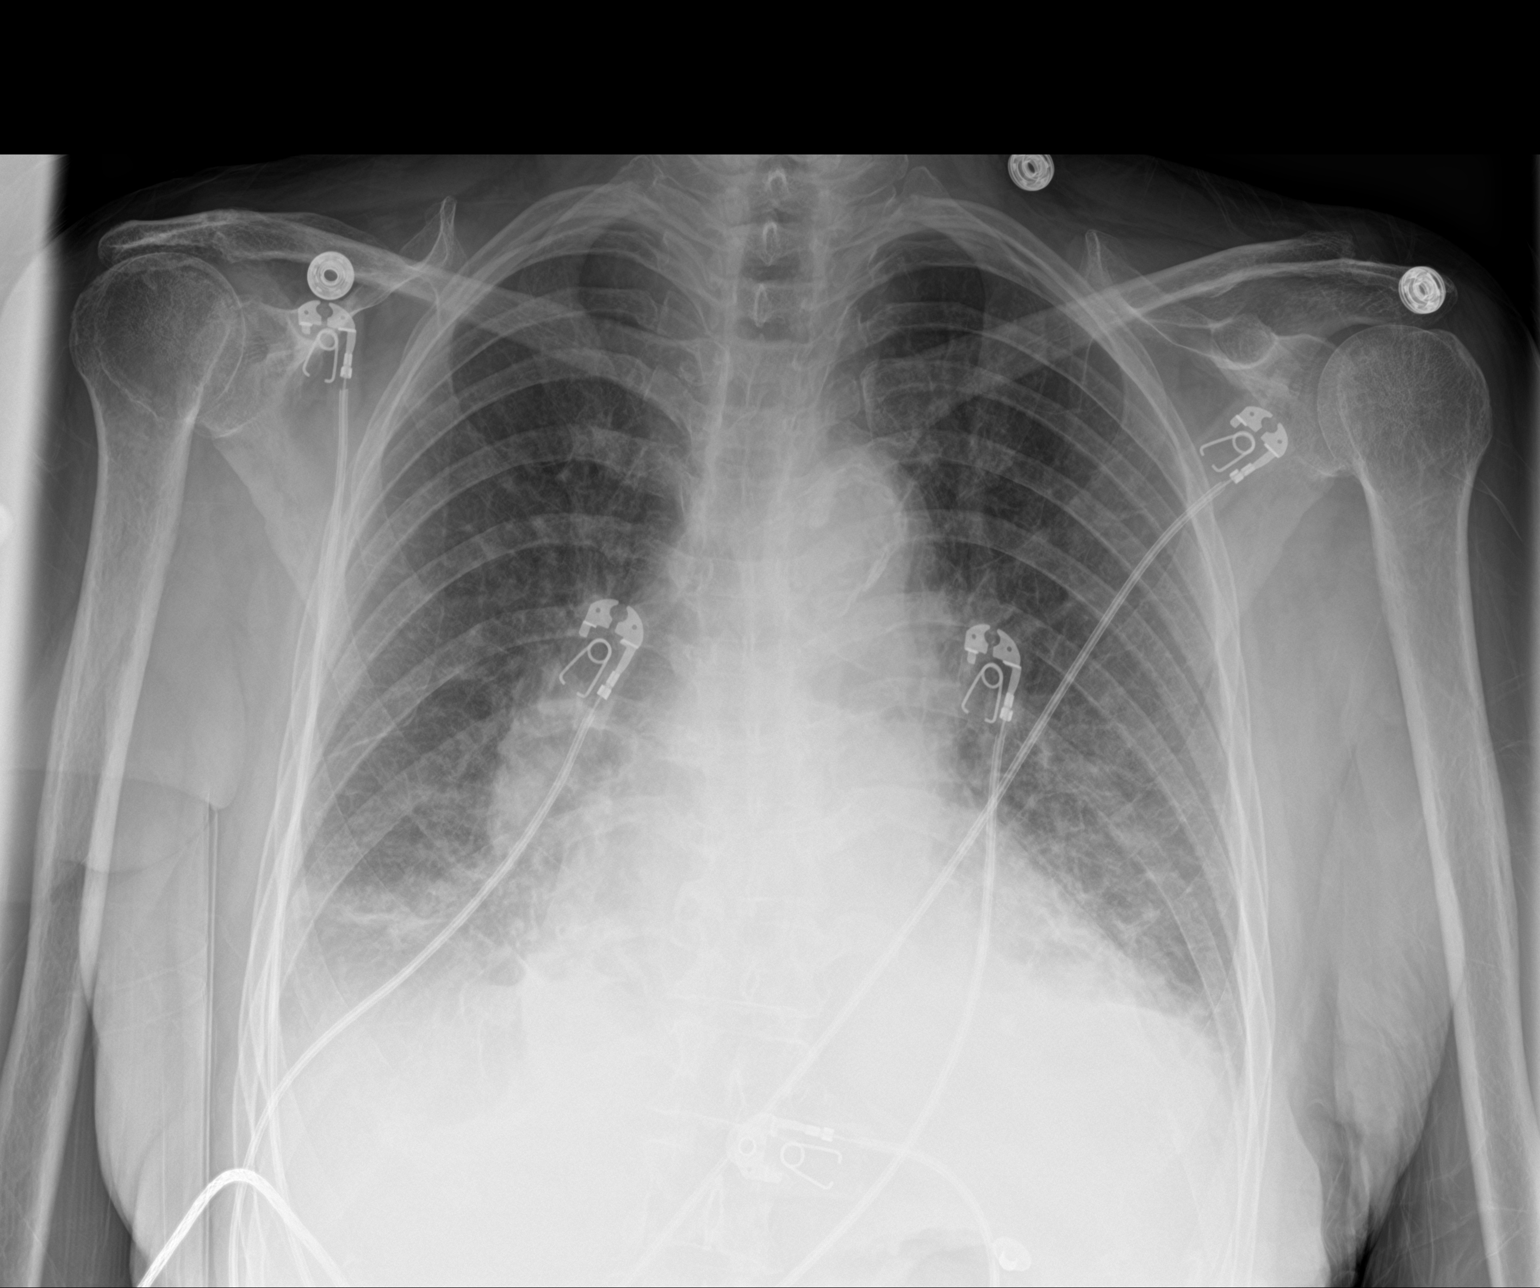

[2 of 2 positions shown; findings below may reference images not displayed]

FINDINGS: The lungs are well-expanded. The interstitial markings remain
increased. Patchy densities are present at both lung bases. Small
amounts of pleural fluid are noted on the right. The cardiac
silhouette is mildly enlarged. There is calcification in the wall of
the aortic arch. The trachea is midline. The bony thorax exhibits no
acute abnormality.
IMPRESSION: CHF with mild interstitial edema. Bibasilar atelectasis. Small right
and trace left pleural effusions.

Thoracic aortic atherosclerosis.

## 2018-10-27 IMAGING — CT CT HEAD W/O CM
3 of 4 series · 15 of 47 positions shown, 18 images · non-contrast
Comparison: None.

CLINICAL DATA: 77-year-old presenting with acute onset of occipital
headache, nausea and vomiting, and acute mental status changes.

EXAM:
CT HEAD WITHOUT CONTRAST
TECHNIQUE: Contiguous axial images were obtained from the base of the skull
through the vertex without intravenous contrast.

[Series 4: head 2.0 h70h · axial · 0.40mm/px · z∈[-111,+1]mm · 9 of 70 slices shown, 12 images]
[im 7/70  brain]
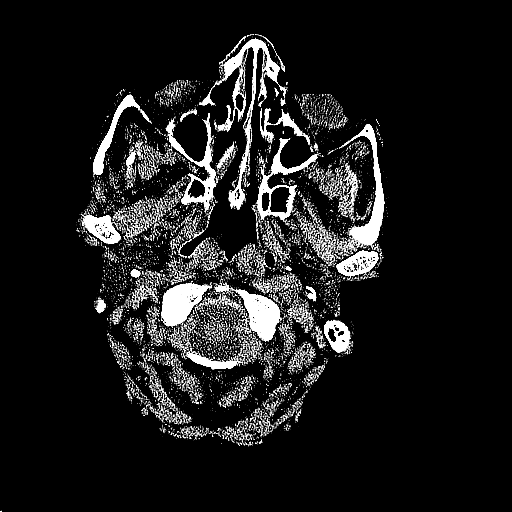
[im 7/70  bone]
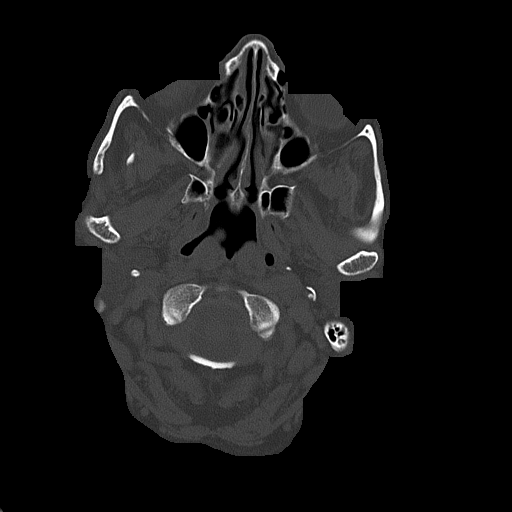
[im 14/70  brain]
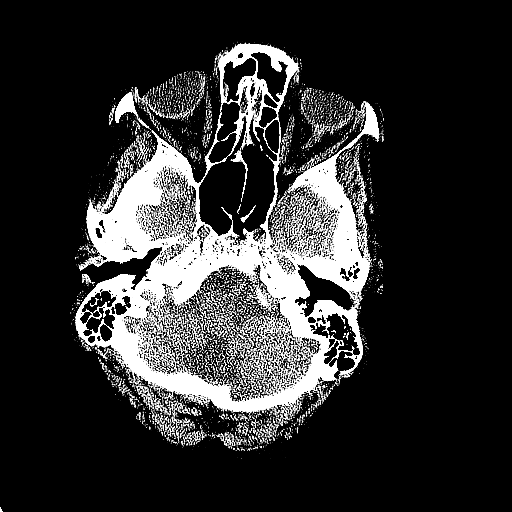
[im 21/70  brain]
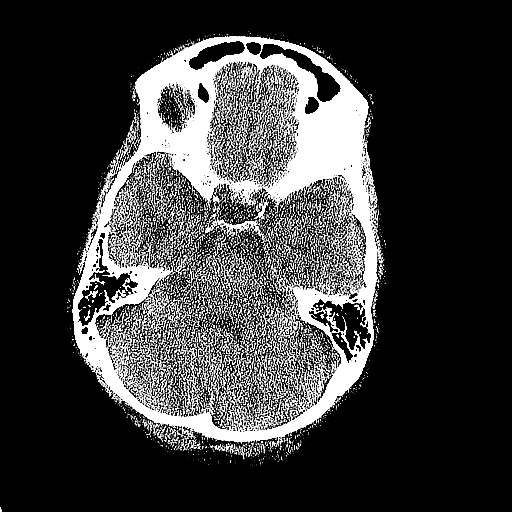
[im 28/70  brain]
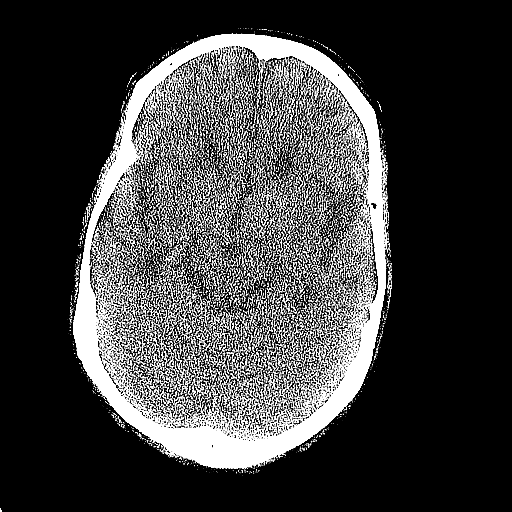
[im 35/70  brain]
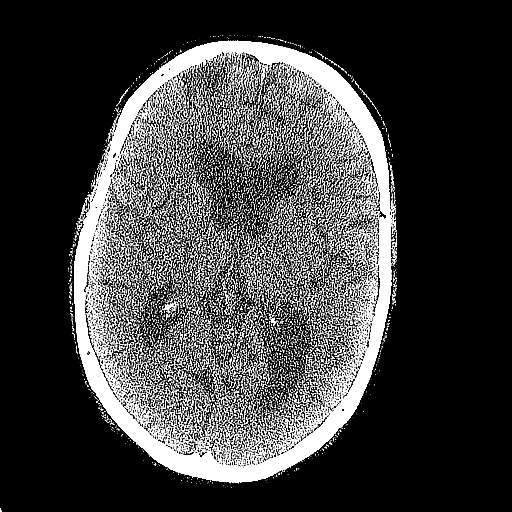
[im 35/70  bone]
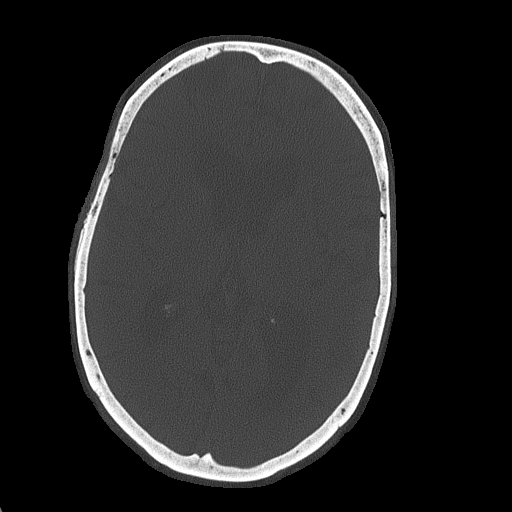
[im 42/70  brain]
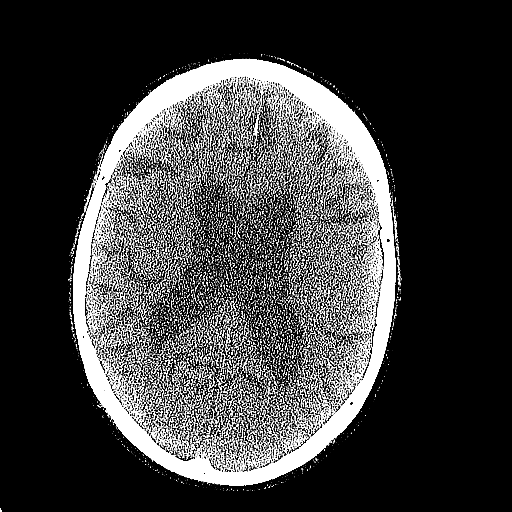
[im 49/70  brain]
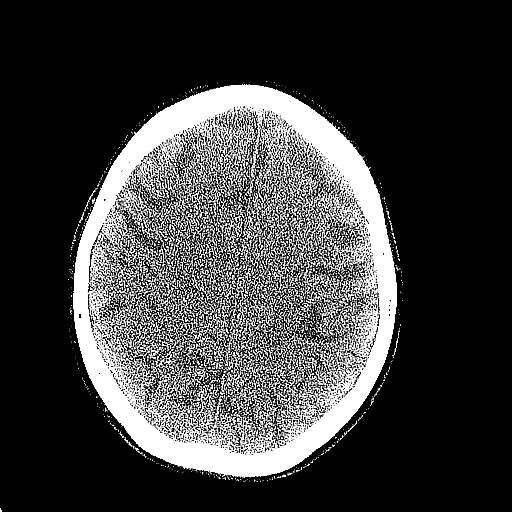
[im 56/70  brain]
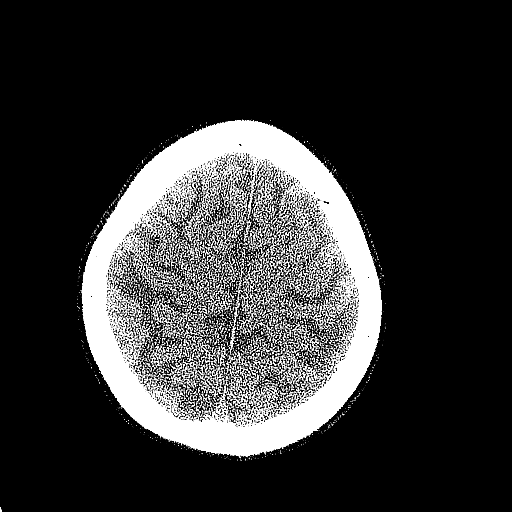
[im 63/70  brain]
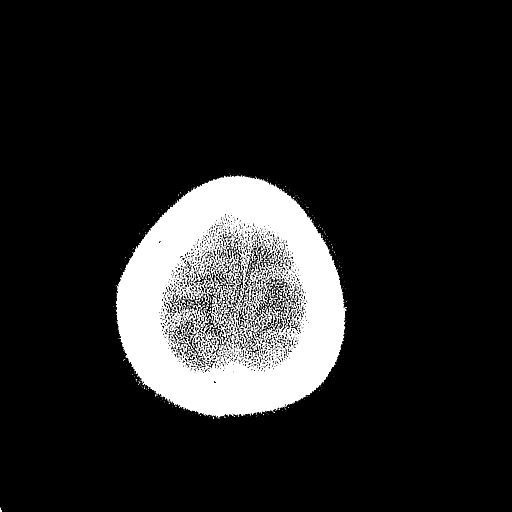
[im 63/70  bone]
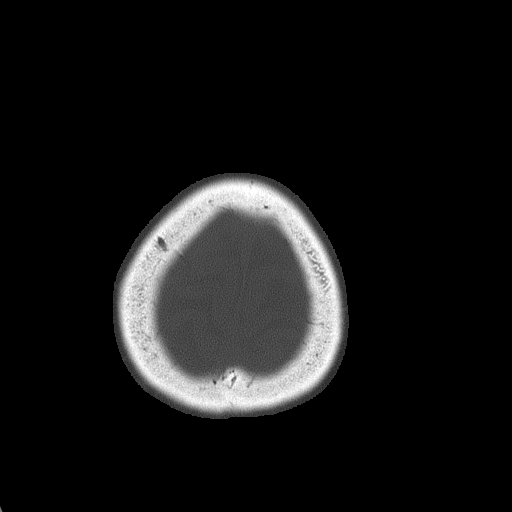

[Series 5: head 3.0 mpr cor · coronal · 0.28mm/px · 3 of 67 slices shown]
[im 23/67  brain]
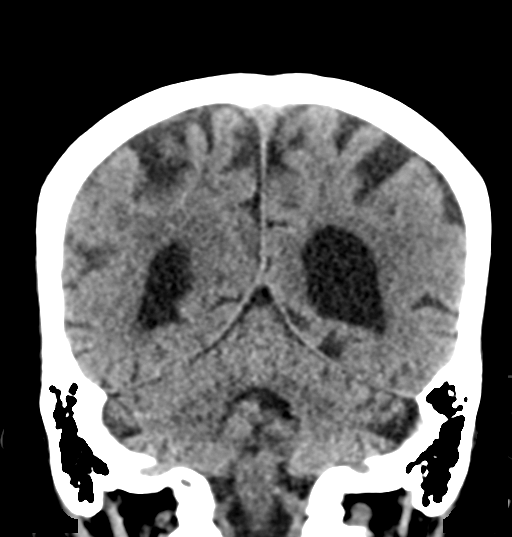
[im 30/67  brain]
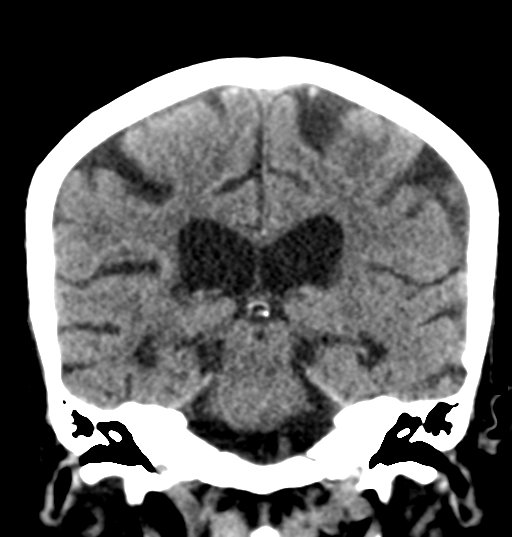
[im 37/67  brain]
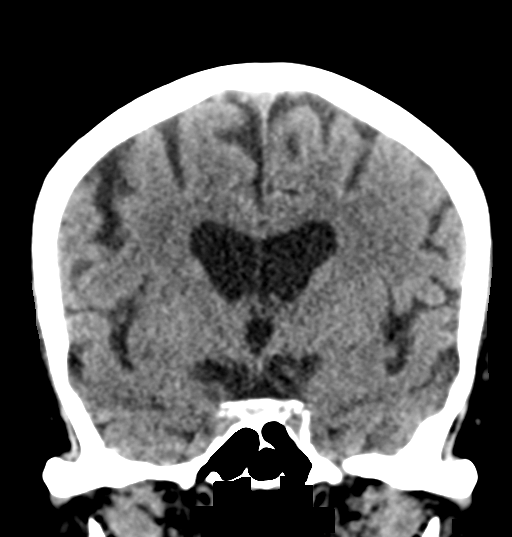

[Series 6: head 3.0 mpr sag · sagittal · 0.29mm/px · 3 of 53 slices shown]
[im 18/53  brain]
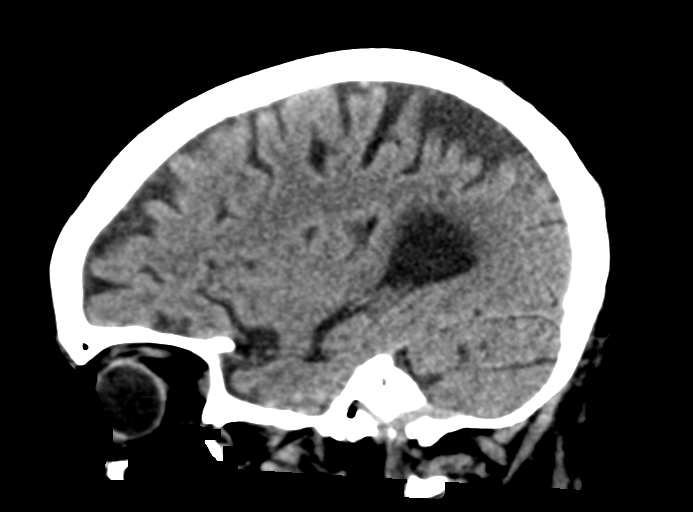
[im 27/53  brain]
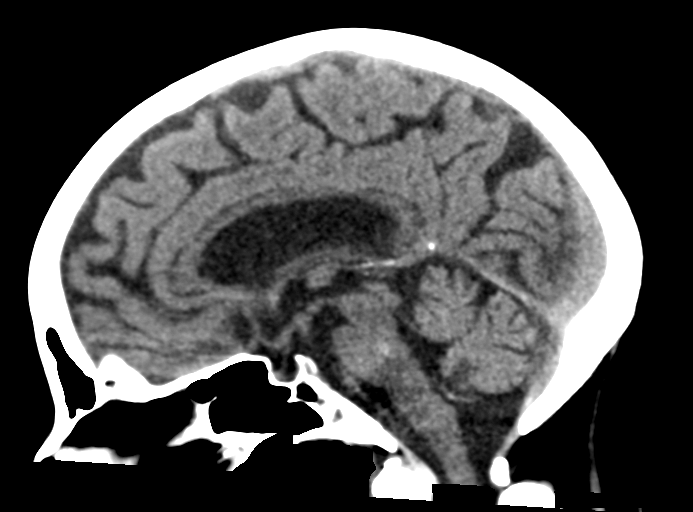
[im 35/53  brain]
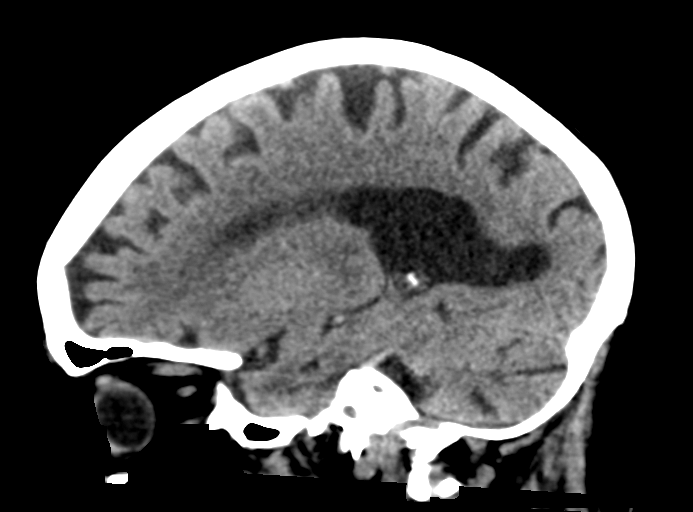

[15 of 47 positions shown; findings below may reference images not displayed]

FINDINGS: Brain: Moderate cortical, deep and cerebellar atrophy. Remote
strokes involving both cerebellar hemispheres. Mild changes of small
vessel disease of the white matter diffusely. No mass lesion. No
midline shift. No acute hemorrhage or hematoma. No extra-axial fluid
collections. No evidence of acute infarction.

Vascular: Moderate BILATERAL carotid siphon and vertebral artery
atherosclerosis. No hyperdense vessel.

Skull: No skull fracture or other focal osseous abnormality
involving the skull.

Sinuses/Orbits: Visualized paranasal sinuses, bilateral mastoid air
cells and bilateral middle ear cavities well-aerated. Bony nasal
septal deviation to the LEFT.

Visualized orbits and globes normal in appearance.

Other: None.
IMPRESSION: 1. No acute intracranial abnormality.
2. Remote strokes involving both cerebellar hemispheres.
3. Moderate generalized atrophy and mild chronic microvascular
ischemic changes of the white matter.

## 2018-11-21 ENCOUNTER — Ambulatory Visit: Payer: Self-pay | Admitting: Internal Medicine

## 2018-11-26 ENCOUNTER — Other Ambulatory Visit: Payer: Self-pay | Admitting: Internal Medicine

## 2018-11-27 ENCOUNTER — Other Ambulatory Visit: Payer: Self-pay | Admitting: Internal Medicine

## 2018-12-28 ENCOUNTER — Other Ambulatory Visit: Payer: Self-pay | Admitting: Internal Medicine

## 2018-12-28 ENCOUNTER — Telehealth: Payer: Self-pay | Admitting: Internal Medicine

## 2018-12-28 MED ORDER — GLIMEPIRIDE 2 MG PO TABS: ORAL_TABLET | ORAL | 11 refills | Status: DC

## 2018-12-28 MED ORDER — CARVEDILOL 6.25 MG PO TABS: 6.2500 mg | ORAL_TABLET | Freq: Two times a day (BID) | ORAL | 3 refills | Status: DC

## 2018-12-28 MED ORDER — SERTRALINE HCL 50 MG PO TABS: 25.0000 mg | ORAL_TABLET | Freq: Every day | ORAL | 11 refills | Status: DC

## 2018-12-28 NOTE — Telephone Encounter (Signed)
Rx's sent to [facility]

## 2018-12-28 NOTE — Telephone Encounter (Signed)
[facility] requesting Rx on Glimepiride 2 mg tablet and Carvedilol. She also is requesting Sertraline to be sent in to [facility] at [place].  Please advise.

## 2019-01-08 ENCOUNTER — Other Ambulatory Visit: Payer: Self-pay | Admitting: Physician Assistant

## 2019-01-23 ENCOUNTER — Other Ambulatory Visit: Payer: Self-pay

## 2019-01-23 ENCOUNTER — Ambulatory Visit: Payer: Self-pay | Admitting: Internal Medicine

## 2019-01-23 ENCOUNTER — Encounter: Payer: Self-pay | Admitting: Internal Medicine

## 2019-01-23 VITALS — BP 122/60 | HR 64 | Resp 12 | Ht <= 58 in | Wt 117.0 lb

## 2019-01-23 DIAGNOSIS — E119 Type 2 diabetes mellitus without complications: Secondary | ICD-10-CM

## 2019-01-23 DIAGNOSIS — N183 Chronic kidney disease, stage 3 unspecified: Secondary | ICD-10-CM

## 2019-01-23 DIAGNOSIS — I255 Ischemic cardiomyopathy: Secondary | ICD-10-CM

## 2019-01-23 DIAGNOSIS — I10 Essential (primary) hypertension: Secondary | ICD-10-CM

## 2019-01-23 NOTE — Progress Notes (Signed)
Subjective:    Patient ID: Sheila White, female   DOB: 08-20-1940, 78 y.o.   MRN: [id number]   HPI   Daughter, Basilia Jumbo,   interprets  1.  DM:  Labs were not at goal in March.  Patient was staying with another relative and did not take her meds with her prior to her A1C and FLP, both of which were out of goal range.  2. Hyperlipidemia:  As above.  3.  CAD with stent placement:  Brilinta has been continued--now more than 1 year out.  Cardiology has elected to continue.  Costing family $400 per month as not able to apply for assistance through pharmaceutical company.  Still at 90 mg twice daily per daughter.  Suspect with Covid pandemic, the office may not be having pharmaceutical rep visits.  4.  Cardiomyopathy:  No dyspnea, though sometimes has LE edema.  Using the Furosemide only when she has ankle edema.  Not following weights.  No cough, except when she has to wear a mask for a prolonged period of time. No PND or orthopnea.    5.  HM:  Did not get Pneumococcal 13 as pandemic hit after last visit and did not have time.  Current Meds  Medication Sig  . aspirin 81 MG chewable tablet Chew 1 tablet (81 mg total) by mouth daily.  Marland Kitchen atorvastatin (LIPITOR) 80 MG tablet TAKE 1 TABLET BY MOUTH EVERY DAY  . carvedilol (COREG) 6.25 MG tablet Take 1 tablet (6.25 mg total) by mouth 2 (two) times daily with a meal.  . famotidine (PEPCID) 20 MG tablet 2 tabs by mouth at bedtime.  . Ferrous Gluconate 324 (37.5 Fe) MG TABS 1 tab by mouth twice daily with half and orange  . furosemide (LASIX) 20 MG tablet Take 1 tablet (20 mg total) by mouth daily as needed for fluid.  Marland Kitchen glimepiride (AMARYL) 2 MG tablet Take 1 tablet by mouth once daily with breakfast  . isosorbide mononitrate (IMDUR) 30 MG 24 hr tablet 1/2 tab by mouth twice daily.  Marland Kitchen loratadine (CLARITIN) 10 MG tablet Take 1 tablet (10 mg total) by mouth daily. (Patient taking differently: Take 10 mg by mouth daily as needed for  allergies. )  . nitroGLYCERIN (NITROSTAT) 0.4 MG SL tablet Place 1 tablet (0.4 mg total) under the tongue every 5 (five) minutes x 3 doses as needed for chest pain.  . sacubitril-valsartan (ENTRESTO) 24-26 MG Take 1 tablet by mouth 2 (two) times daily.  . sertraline (ZOLOFT) 50 MG tablet Take 0.5 tablets (25 mg total) by mouth at bedtime.  . ticagrelor (BRILINTA) 90 MG TABS tablet Take 1 tablet (90 mg total) by mouth 2 (two) times daily.  Marland Kitchen triamcinolone cream (KENALOG) 0.1 % Apply 1 application topically 2 (two) times daily.   No Known Allergies   Review of Systems    Objective:   BP 122/60 (BP Location: Left Arm, Patient Position: Sitting, Cuff Size: Normal)   Pulse 64   Resp 12   Ht 4\' 10"  (1.473 m)   Wt 117 lb (53.1 kg)   BMI 24.45 kg/m   Physical Exam  NAD HEENT: PERRL, EOMI,  Neck:  Supple, No adenopathy Chest:  Initial crackles at right base resolve with repeated deep breathing.  Resonant to percussion over same area CV:  RRR without murmur or rub.  Radial and DP pulses normal and equal Abd:  S, NT, + BS LE:  Mild pitting edema to about 1  inch below knee.     Assessment & Plan   1.  DM:  To return for labs fasting in next week.  A1C and urine microalbumin/crea to see if back on track.    2.  Hypertension:  controlled  3.  CAD/Cardiomyopathy:  Plavix nonresponder.  Called Dr. Landry DykeKelly's office and obtained contact information for Brilinta rep:  Janyce Llanosllison Phillips [phone].   Patient is now more than 1 year following AMI/PCI and will likely need a decrease in dosage to 60 mg twice daily. Currently still taking the 90 mg twice daily. Ms. Vear Clockhillips will contact Dr. Landry DykeKelly's office to see if she can get a signature--[facility] is not allowing entry for signatures currently to their outpatient sites so not able to give samples. May also get a copay card for $100 Also discussed if possible, I can give a signature as well. She will get back to me after discussing with cardiology  office.  Also, patient with 7 lb increase since last visit end of February.  Mild pitting edema.  Showed daughter how to gently check for this and then treat with intermittent Furosemide.   Also to check weight weekly to prevent decompensated CHF. They are watching sodium intake  4. Hyperlipidemia:  FLP not at goal last check as well--return this week for fasting labs.

## 2019-01-25 ENCOUNTER — Other Ambulatory Visit: Payer: Self-pay

## 2019-01-25 ENCOUNTER — Other Ambulatory Visit (INDEPENDENT_AMBULATORY_CARE_PROVIDER_SITE_OTHER): Payer: Self-pay

## 2019-01-25 DIAGNOSIS — E119 Type 2 diabetes mellitus without complications: Secondary | ICD-10-CM

## 2019-01-25 DIAGNOSIS — E785 Hyperlipidemia, unspecified: Secondary | ICD-10-CM

## 2019-01-25 DIAGNOSIS — N183 Chronic kidney disease, stage 3 unspecified: Secondary | ICD-10-CM

## 2019-01-26 ENCOUNTER — Other Ambulatory Visit: Payer: Self-pay

## 2019-01-26 LAB — COMPREHENSIVE METABOLIC PANEL
ALT: 19 IU/L (ref 0–32)
AST: 21 IU/L (ref 0–40)
Albumin/Globulin Ratio: 1.6 (ref 1.2–2.2)
Albumin: 4.3 g/dL (ref 3.7–4.7)
Alkaline Phosphatase: 75 IU/L (ref 39–117)
BUN/Creatinine Ratio: 34 — ABNORMAL HIGH (ref 12–28)
BUN: 53 mg/dL — ABNORMAL HIGH (ref 8–27)
Bilirubin Total: 0.3 mg/dL (ref 0.0–1.2)
CO2: 20 mmol/L (ref 20–29)
Calcium: 9.4 mg/dL (ref 8.7–10.3)
Chloride: 96 mmol/L (ref 96–106)
Creatinine, Ser: 1.55 mg/dL — ABNORMAL HIGH (ref 0.57–1.00)
GFR calc Af Amer: 36 mL/min/{1.73_m2} — ABNORMAL LOW (ref >59)
GFR calc non Af Amer: 31 mL/min/{1.73_m2} — ABNORMAL LOW (ref >59)
Globulin, Total: 2.7 g/dL (ref 1.5–4.5)
Glucose: 101 mg/dL — ABNORMAL HIGH (ref 65–99)
Potassium: 6 mmol/L — ABNORMAL HIGH (ref 3.5–5.2)
Sodium: 132 mmol/L — ABNORMAL LOW (ref 134–144)
Total Protein: 7 g/dL (ref 6.0–8.5)

## 2019-01-26 LAB — LIPID PANEL W/O CHOL/HDL RATIO
Cholesterol, Total: 158 mg/dL (ref 100–199)
HDL: 61 mg/dL (ref >39)
LDL Calculated: 77 mg/dL (ref 0–99)
Triglycerides: 100 mg/dL (ref 0–149)
VLDL Cholesterol Cal: 20 mg/dL (ref 5–40)

## 2019-01-26 LAB — MICROALBUMIN / CREATININE URINE RATIO
Creatinine, Urine: 32.7 mg/dL
Microalb/Creat Ratio: 156 mg/g creat — ABNORMAL HIGH (ref 0–29)
Microalbumin, Urine: 51 ug/mL

## 2019-01-26 LAB — HGB A1C W/O EAG: Hgb A1c MFr Bld: 6.8 % — ABNORMAL HIGH (ref 4.8–5.6)

## 2019-01-29 ENCOUNTER — Other Ambulatory Visit: Payer: Self-pay

## 2019-01-29 DIAGNOSIS — E875 Hyperkalemia: Secondary | ICD-10-CM

## 2019-01-30 LAB — BASIC METABOLIC PANEL
BUN/Creatinine Ratio: 34 — ABNORMAL HIGH (ref 12–28)
BUN: 58 mg/dL — ABNORMAL HIGH (ref 8–27)
CO2: 19 mmol/L — ABNORMAL LOW (ref 20–29)
Calcium: 9 mg/dL (ref 8.7–10.3)
Chloride: 102 mmol/L (ref 96–106)
Creatinine, Ser: 1.69 mg/dL — ABNORMAL HIGH (ref 0.57–1.00)
GFR calc Af Amer: 33 mL/min/{1.73_m2} — ABNORMAL LOW (ref >59)
GFR calc non Af Amer: 28 mL/min/{1.73_m2} — ABNORMAL LOW (ref >59)
Glucose: 104 mg/dL — ABNORMAL HIGH (ref 65–99)
Potassium: 5.5 mmol/L — ABNORMAL HIGH (ref 3.5–5.2)
Sodium: 137 mmol/L (ref 134–144)

## 2019-02-01 ENCOUNTER — Telehealth: Payer: Self-pay

## 2019-02-01 NOTE — Telephone Encounter (Signed)
Please call [name] and find out how patient is doing on the Furosemide? Per Dr. Amil Amen her potassium has come down so she wants her to take 1/2 tablet of furosemide in the am instead of a whole tablet. She thinks the furosemide may be drying her out to much. Find out if the swelling in her ankles is better.  To Antony Madura to contact rosa

## 2019-02-02 NOTE — Telephone Encounter (Signed)
[name] returned call and stated patient is doing ok with Furosemide; when Antony Madura gave instructions Per Dr. Amil Amen she states she was told by the Cardiologist to take Furosemide just as need it and that is the way that she has been doing it. She stated patient is taking half tablet when needs it. She also states swelling in patient's ankles is better.

## 2019-02-02 NOTE — Telephone Encounter (Signed)
Left [name] a detailed vm  to call back.

## 2019-02-05 NOTE — Telephone Encounter (Signed)
nicky please call rose and notify her that her mothers Sheila White is here to pick up

## 2019-02-14 ENCOUNTER — Telehealth: Payer: Self-pay | Admitting: Internal Medicine

## 2019-02-14 NOTE — Telephone Encounter (Signed)
[name] called stating Sheila White needs more Rx on ticagrelor (BRILINTA) 90 MG TABS tablet; she states the bottle that was picked up last week only had 4 pills and was told by Dr. Amil Amen to let us know when is needs more medication to check if we are able to get samples of the medication for patient.  Please advise.

## 2019-02-14 NOTE — Telephone Encounter (Signed)
Spoke to Dr. Amil Amen and states she has contacted Emmit Pomfret at [phone]. Ebony Hail to contact Dr. Evette Georges office to get a signature to authorize and will get back with Dr. Amil Amen.  We will contact patient as soon as we hear back

## 2019-02-15 NOTE — Telephone Encounter (Signed)
Routed to [place] for follow up after Dr. Amil Amen hears back from [place]

## 2019-02-16 ENCOUNTER — Other Ambulatory Visit: Payer: Self-pay

## 2019-02-16 DIAGNOSIS — E875 Hyperkalemia: Secondary | ICD-10-CM

## 2019-02-16 NOTE — Telephone Encounter (Signed)
Left message for Dr. Claiborne Billings nurse to call back regarding samples

## 2019-02-16 NOTE — Telephone Encounter (Signed)
Spoke with [employer] and rosa. Dr. Evette Georges office will have samples but it will take about a week for them to get it. Patient will need to purchase a weeks worth then should be able to get the samples. Dr. Amil Amen is aware and verbalized understanding and so did [facility]

## 2019-02-17 LAB — BASIC METABOLIC PANEL
BUN/Creatinine Ratio: 30 — ABNORMAL HIGH (ref 12–28)
BUN: 50 mg/dL — ABNORMAL HIGH (ref 8–27)
CO2: 21 mmol/L (ref 20–29)
Calcium: 8.8 mg/dL (ref 8.7–10.3)
Chloride: 101 mmol/L (ref 96–106)
Creatinine, Ser: 1.69 mg/dL — ABNORMAL HIGH (ref 0.57–1.00)
GFR calc Af Amer: 33 mL/min/{1.73_m2} — ABNORMAL LOW (ref >59)
GFR calc non Af Amer: 28 mL/min/{1.73_m2} — ABNORMAL LOW (ref >59)
Glucose: 252 mg/dL — ABNORMAL HIGH (ref 65–99)
Potassium: 5.3 mmol/L — ABNORMAL HIGH (ref 3.5–5.2)
Sodium: 136 mmol/L (ref 134–144)

## 2019-02-20 NOTE — Telephone Encounter (Signed)
Noted  

## 2019-03-28 ENCOUNTER — Other Ambulatory Visit: Payer: Self-pay | Admitting: Physician Assistant

## 2019-03-29 ENCOUNTER — Telehealth: Payer: Self-pay | Admitting: Internal Medicine

## 2019-03-29 NOTE — Telephone Encounter (Signed)
Spoke with pharmacy. States patient has to have orange card to get refills on atorvastatin and sertraline states she has been getting them refilled at [facility] because her orange card has been expired since July. Informed okay to fill but needs to choose one place to pick up medication. Spoke with daughter Basilia Jumbo and message was given. Verbalized understanding.

## 2019-03-29 NOTE — Telephone Encounter (Signed)
Butch Penny at [facility] is on the phone requesting authorization from Dr. Amil Amen to Rx Atorvastatin and Zoloft last filled on 12/28/2018.  Please advise.

## 2019-04-10 ENCOUNTER — Other Ambulatory Visit: Payer: Self-pay | Admitting: Internal Medicine

## 2019-04-30 ENCOUNTER — Ambulatory Visit: Payer: Self-pay | Admitting: Internal Medicine

## 2019-05-10 ENCOUNTER — Telehealth: Payer: Self-pay | Admitting: Physician Assistant

## 2019-05-10 NOTE — Telephone Encounter (Signed)
New Message:     Please let me know when pt is supposed to have her next follow up visit please.

## 2019-05-10 NOTE — Telephone Encounter (Signed)
Per 4-21 HM note  You will need a follow up appointment in 4 months.  Please call our office 2 months in advance to schedule this appointment.  You may see Shelva Majestic, MD or one of the following Advanced Practice Providers on your designated Care Team:  Almyra Deforest, PA-C  Fabian Sharp, [place]  Tried to call pt cell this number is disconnected called [place] detailed message to call as stated above and whomever answers chould make follow up appointment

## 2019-05-19 ENCOUNTER — Other Ambulatory Visit: Payer: Self-pay | Admitting: Internal Medicine

## 2019-05-23 ENCOUNTER — Telehealth: Payer: Self-pay

## 2019-05-23 NOTE — Telephone Encounter (Signed)
Interpreter ID: [id number]  Contact patient to move appt, provider being called to the hospital.   Left message on all numbers in patients chart advising patient to contact office to reschedule appt to 05/31/2019.

## 2019-06-01 ENCOUNTER — Ambulatory Visit: Payer: Self-pay | Admitting: Physician Assistant

## 2019-06-04 ENCOUNTER — Telehealth: Payer: Self-pay | Admitting: Internal Medicine

## 2019-06-04 MED ORDER — ATORVASTATIN CALCIUM 80 MG PO TABS: 80.0000 mg | ORAL_TABLET | Freq: Every day | ORAL | 7 refills | Status: DC

## 2019-06-04 NOTE — Telephone Encounter (Signed)
Patient called requesting atorvastatin (LIPITOR) 80 MG tablet to be called in at [place].  Please advise.

## 2019-06-04 NOTE — Telephone Encounter (Signed)
Rx sent to [facility]. Please notify patient 

## 2019-06-04 NOTE — Telephone Encounter (Signed)
Pt. Notified.

## 2019-06-06 ENCOUNTER — Ambulatory Visit: Payer: Self-pay | Admitting: Internal Medicine

## 2019-07-12 ENCOUNTER — Other Ambulatory Visit: Payer: Self-pay | Admitting: Cardiovascular Disease

## 2019-07-12 ENCOUNTER — Other Ambulatory Visit: Payer: Self-pay | Admitting: Internal Medicine

## 2019-07-17 ENCOUNTER — Other Ambulatory Visit: Payer: Self-pay

## 2019-07-17 ENCOUNTER — Ambulatory Visit (INDEPENDENT_AMBULATORY_CARE_PROVIDER_SITE_OTHER): Payer: Self-pay | Admitting: Cardiovascular Disease

## 2019-07-17 ENCOUNTER — Encounter: Payer: Self-pay | Admitting: Cardiovascular Disease

## 2019-07-17 VITALS — BP 138/70 | HR 67 | Ht <= 58 in | Wt 123.0 lb

## 2019-07-17 DIAGNOSIS — I255 Ischemic cardiomyopathy: Secondary | ICD-10-CM

## 2019-07-17 DIAGNOSIS — N1832 Chronic kidney disease, stage 3b: Secondary | ICD-10-CM

## 2019-07-17 DIAGNOSIS — I10 Essential (primary) hypertension: Secondary | ICD-10-CM

## 2019-07-17 DIAGNOSIS — I5042 Chronic combined systolic (congestive) and diastolic (congestive) heart failure: Secondary | ICD-10-CM

## 2019-07-17 DIAGNOSIS — I2102 ST elevation (STEMI) myocardial infarction involving left anterior descending coronary artery: Secondary | ICD-10-CM

## 2019-07-17 DIAGNOSIS — E785 Hyperlipidemia, unspecified: Secondary | ICD-10-CM

## 2019-07-17 MED ORDER — TICAGRELOR 60 MG PO TABS: 60.0000 mg | ORAL_TABLET | Freq: Two times a day (BID) | ORAL | 6 refills | Status: DC

## 2019-07-17 NOTE — Patient Instructions (Signed)
Medication Instructions:  DECREASE- Brilinta 60 mg by mouth twice a day  *If you need a refill on your cardiac medications before your next appointment, please call your pharmacy*  Lab Work: None Ordered If you have labs (blood work) drawn today and your tests are completely normal, you will receive your results only by: Marland Kitchen MyChart Message (if you have MyChart) OR . A paper copy in the mail If you have any lab test that is abnormal or we need to change your treatment, we will call you to review the results.  Testing/Procedures: Your physician has requested that you have an echocardiogram. Echocardiography is a painless test that uses sound waves to create images of your heart. It provides your doctor with information about the size and shape of your heart and how well your heart's chambers and valves are working. This procedure takes approximately one hour. There are no restrictions for this procedure.  Follow-Up: At [facility], you and your health needs are our priority.  As part of our continuing mission to provide you with exceptional heart care, we have created designated Provider Care Teams.  These Care Teams include your primary Cardiologist (physician) and Advanced Practice Providers (APPs -  Physician Assistants and Nurse Practitioners) who all work together to provide you with the care you need, when you need it.  Your next appointment:   6 month(s)  The format for your next appointment:   In Person  Provider:   Nicki Guadalajara, MD

## 2019-07-17 NOTE — Progress Notes (Signed)
Cardiology Office Note    Date:  07/19/2019   ID:  Sheila White, [place] 23-Jan-1941, MRN [id number]  PCP:  Mack Hook, MD  Cardiologist:  Shelva Majestic, MD   12 month F/u office visit evaluation  History of Present Illness:  Sheila White is a 79 y.o. female who presented to [facility] on October 05, 2017 with an ST segment elevation anterior myocardial infarction with late presentation.  Her symptoms had developed the day prior to admission and became more progressive with reference to shortness of breath and continued chest pain.  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  She had 50% proximal OM1 stenosis followed by 95% distal marginal stenosis and there was mild irregularity of the RCA.  She underwent successful PCI to the LAD with ultimate insertion of a 2.5 x 26 mm Resolute DES stent postdilated 2.8 proximally and 2.68 distally with a 99% stenosis reduced to 0%.  The mid LAD stenosis was treated with DES stenting with a 2 to 5 x 12 mm Resolute stent.  The apical stenosis was treated with PTCA.  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  Her peak creatinine was 3.86 which improved to 1.88 at discharge.  He was ultimately discharged on October 24, 2017 and on Oct 28, 2017 so [name] for her initial office evaluation.  Patient has had issues with chronic cough.  Her pericardial symptoms had improved and she was treated with colchicine.    I saw her for initial post hospital follow-up evaluation on Nov 18, 2017.  At that time she denied any recurrent chest pain symptomatology.  She was tentatively scheduled to travel to [place] several days later and I recommended that she postpone this trip.  Since I saw her, she  developed some chest pain and was  hospitalized on May 25 through Nov 22, 2017.  Troponins were normal.  Follow-up echo Doppler study showed an EF of 30 to 35%.  There was mid distal anterior, apical inferoapical severe hypokinesis secondary to her prior LAD territory infarct.  There was grade 1 diastolic dysfunction.  She also complained of some blurry vision with gait abnormality.  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  She was hyponatremic which revolved resolved.  She was seen by [name] post discharge on December 01, 2017.  At that time, her blood pressure was stable and pulse 77.  I saw her in June 2019.  At that time the plan was to start Plavix due to her foreign status and apparently Plavix only rather than Brilinta could be used.  However, P2Y12 test demonstrated that she was not responsive to Plavix.  As a result we have been supplying her with samples of Brilinta.   I saw her in September 2019.  Her renal function was gradually improving.  She was not having any anginal symptoms and her blood pressure was stable.   I last saw her in the office in January 2020 at which time she continued to feel well.    Most recent laboratory has shown further improvement in renal function with a creatinine of 1.48 improved from 1.61 in December 2019.  She has continued to be stable.  She has been taking aspirin and Brilinta for DAPT.  She is on atorvastatin 80 mg for hyperlipidemia with target LDL less than 70.  She is unaware of any palpitations and continues to take carvedilol 6.25 mg twice a day.  She has been taking furosemide 40 mg daily without edema.  Renal function had stabilized with creatinines in the 1.4 range.  At that time, I elected to initiate low-dose Entresto in light of her continued LV dysfunction EF at 30 to 35%.  There were no signs of overload.  I recommended follow-up laboratory 2 weeks later and reassessment with our pharmacist.  She was last evaluated by Almyra Deforest, PA in a telemedicine visit in April 2020.  She  was doing well and her Lasix dose had been reduced to allow the addition of Entresto at her prior evaluation.  Since her blood pressure had been low at the pharmacy evaluation she continues to be on the 24/26 mg twice daily regimen of Entresto.  She is a Plavix nonresponder and for this reason has been maintained on Brilinta.  Over the past 9 months, she has continued to do well.  She specifically denies chest pain or shortness of breath.  She has noticed significant benefit with the initiation of Entresto.  She had not had recent laboratory since July 2020 and at that time creatinine was 1.55.  Lipid studies revealed a cholesterol of 158, HDL 61, LDL 77 and triglycerides 100.  Presents for reevaluation.  Past Medical History:  Diagnosis Date  . Acute combined systolic and diastolic CHF, NYHA class 3 ([place]) 10/10/2017   in setting of MI  . Acute cystitis without hematuria   . Acute on chronic systolic congestive heart failure ([place]) 10/20/2017  . Anxiety and depression 12/19/2017  . Chest pain 10/31/2017  . Chest pain, rule out acute myocardial infarction 11/19/2017  . Coronary artery disease 10/31/2017  . Dyspnea 10/05/2017  . Heart failure ([place]) 12/23/2017  . HOH (hard of hearing)   . Ischemic cardiomyopathy 10/05/2017  . MI, acute, non ST segment elevation ([place])   . Non-insulin dependent type 2 diabetes mellitus ([place]) 2004  . 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 STEMI involving left anterior descending coronary artery ([place]) 10/05/2017    Past Surgical History:  Procedure Laterality Date  . CORONARY/GRAFT ACUTE MI REVASCULARIZATION N/A 10/05/2017   Procedure: Coronary/Graft Acute MI Revascularization;  Surgeon: Troy Sine, MD;  Location: [facility];  Service: Cardiovascular;  Laterality: N/A;  . LEFT HEART CATH AND CORONARY ANGIOGRAPHY N/A 10/05/2017   Procedure: LEFT HEART CATH AND CORONARY ANGIOGRAPHY;  Surgeon: Troy Sine, MD;  Location: [facility];   Service: Cardiovascular;  Laterality: N/A;    Current Medications: Outpatient Medications Prior to Visit  Medication Sig Dispense Refill  . aspirin 81 MG chewable tablet Chew 1 tablet (81 mg total) by mouth daily. 60 tablet 2  . atorvastatin (LIPITOR) 80 MG tablet Take 1 tablet (80 mg total) by mouth daily. 30 tablet 7  . famotidine (PEPCID) 20 MG tablet TAKE 2 TABLETS BY MOUTH AT BEDTIME 120 tablet 0  . furosemide (LASIX) 20 MG tablet Take 1 tablet (20 mg total) by mouth daily as needed for fluid. 30 tablet 1  . glimepiride (AMARYL) 2 MG tablet Take 1 tablet by mouth once daily with breakfast 30 tablet 0  . isosorbide mononitrate (IMDUR) 30 MG 24 hr tablet Take 1 tablet by mouth once daily 90 tablet 0  . loratadine (CLARITIN) 10 MG tablet Take 1 tablet (10 mg total) by mouth daily. 60 tablet 2  . nitroGLYCERIN (NITROSTAT) 0.4 MG SL tablet Place 1  tablet (0.4 mg total) under the tongue every 5 (five) minutes x 3 doses as needed for chest pain. 25 tablet 2  . sacubitril-valsartan (ENTRESTO) 24-26 MG Take 1 tablet by mouth 2 (two) times daily. 60 tablet 11  . sertraline (ZOLOFT) 50 MG tablet Take 0.5 tablets (25 mg total) by mouth at bedtime. 15 tablet 11  . triamcinolone cream (KENALOG) 0.1 % Apply 1 application topically 2 (two) times daily. 30 g 1  . BRILINTA 90 MG TABS tablet Take 1 tablet by mouth twice daily 90 tablet 1  . Ferrous Gluconate 324 (37.5 Fe) MG TABS 1 tab by mouth twice daily with half and orange    . carvedilol (COREG) 6.25 MG tablet Take 1 tablet (6.25 mg total) by mouth 2 (two) times daily with a meal. 180 tablet 3   No facility-administered medications prior to visit.     Allergies:   Patient has no known allergies.   Social History   Socioeconomic History  . Marital status: Widowed    Spouse name: Not on file  . Number of children: 8  . Years of education: 30  . Highest education level: Not on file  Occupational History  . Occupation: Cabin crew, retired    Tobacco Use  . Smoking status: Never Smoker  . Smokeless tobacco: Never Used  Substance and Sexual Activity  . Alcohol use: Never  . Drug use: Never  . Sexual activity: Not Currently  Other Topics Concern  . Not on file  Social History Narrative   Living with daughter, Marisue [place] and her husband and her 3 sons.   Social Determinants of Health   Financial Resource Strain:   . Difficulty of Paying Living Expenses: Not on file  Food Insecurity: No Food Insecurity  . Worried About Charity fundraiser in the Last Year: Never true  . Ran Out of Food in the Last Year: Never true  Transportation Needs: No Transportation Needs  . Lack of Transportation (Medical): No  . Lack of Transportation (Non-Medical): No  Physical Activity:   . Days of Exercise per Week: Not on file  . Minutes of Exercise per Session: Not on file  Stress:   . Feeling of Stress : Not on file  Social Connections: Unknown  . Frequency of Communication with Friends and Family: More than three times a week  . Frequency of Social Gatherings with Friends and Family: Not on file  . Attends Religious Services: 1 to 4 times per year  . Active Member of Clubs or Organizations: No  . Attends Archivist Meetings: Never  . Marital Status: Not on file     Family History:  The patient's family history includes Alcohol abuse in her father.   ROS General: Negative; No fevers, chills, or night sweats;  HEENT: Negative; No changes in vision or hearing, sinus congestion, difficulty swallowing Pulmonary: Negative; No cough, wheezing, shortness of breath, hemoptysis Cardiovascular: See HPI GI: Obvious nausea, resolved Musculoskeletal: Negative; no myalgias, joint pain, or weakness Hematologic/Oncology: Negative; no easy bruising, bleeding Endocrine: Negative; no heat/cold intolerance; no diabetes Neuro: Negative; no changes in balance, headaches; previous dizziness resolved Skin: Negative; No rashes or skin  lesions Psychiatric: Negative; No behavioral problems, depression Sleep: Negative; No snoring, daytime sleepiness, hypersomnolence, bruxism, restless legs, hypnogognic hallucinations, no cataplexy Other comprehensive 14 point system review is negative.   PHYSICAL EXAM:   VS:  BP 138/70   Pulse 67   Ht '4\' 10"'  (1.473 m)   Wt  123 lb (55.8 kg)   SpO2 98%   BMI 25.71 kg/m    Repeat blood pressure by me was 120/80  Wt Readings from Last 3 Encounters:  07/17/19 123 lb (55.8 kg)  01/23/19 117 lb (53.1 kg)  08/23/18 110 lb (49.9 kg)    General: Alert, oriented, no distress.  Skin: normal turgor, no rashes, warm and dry HEENT: Normocephalic, atraumatic. Pupils equal round and reactive to light; sclera anicteric; extraocular muscles intact;  Nose without nasal septal hypertrophy Mouth/Parynx benign; Mallinpatti scale 3 Neck: No JVD, no carotid bruits; normal carotid upstroke Lungs: clear to ausculatation and percussion; no wheezing or rales Chest wall: without tenderness to palpitation Heart: PMI not displaced, RRR, s1 s2 normal, 1/6 systolic murmur, no diastolic murmur, no rubs, gallops, thrills, or heaves Abdomen: soft, nontender; no hepatosplenomehaly, BS+; abdominal aorta nontender and not dilated by palpation. Back: no CVA tenderness Pulses 2+ Musculoskeletal: full range of motion, normal strength, no joint deformities Extremities: no clubbing cyanosis or edema, Homan's sign negative  Neurologic: grossly nonfocal; Cranial nerves grossly wnl Psychologic: Normal mood and affect   Studies/Labs Reviewed:   EKG:  EKG is ordered today.  Normal sinus rhythm at 67 bpm.  Left anterior hemiblock.  Old anterior MI with Q waves and poor R wave progression throughout the entire precordium  July 27, 2018 ECG (independently read by me): Normal sinus rhythm at 62 bpm.  Old inferior MI and anterior MI.  Left axis deviation.  No ectopy.  QTc interval 430 ms.  March 27, 2018 ECG  (independently read by me): Normal sinus rhythm at 78 bpm.  Old anterior MI with QS V1 through V5 and inferior Q waves.  No ectopy.  December 20, 2017 ECG (independently read by me): Normal sinus rhythm 70 bpm.  Old anterior MI with poor progression V1 through V5 with Q waves noted in 3 and aVF.  Normal intervals.  No ectopy.  11/18/2017 ECG (independently read by me): Sinus rhythm at 86 bpm.  Left anterior hemiblock.  QS complex V1 through V5 consistent with anterior infarct.  Inferior Q waves suggestive of inferior MI.  Normal intervals.  No ectopy.  Recent Labs: BMP Latest Ref Rng & Units 02/16/2019 01/29/2019 01/25/2019  Glucose 65 - 99 mg/dL 252(H) 104(H) 101(H)  BUN 8 - 27 mg/dL 50(H) 58(H) 53(H)  Creatinine 0.57 - 1.00 mg/dL 1.69(H) 1.69(H) 1.55(H)  BUN/Creat Ratio 12 - 28 30(H) 34(H) 34(H)  Sodium 134 - 144 mmol/L 136 137 132(L)  Potassium 3.5 - 5.2 mmol/L 5.3(H) 5.5(H) 6.0(H)  Chloride 96 - 106 mmol/L 101 102 96  CO2 20 - 29 mmol/L 21 19(L) 20  Calcium 8.7 - 10.3 mg/dL 8.8 9.0 9.4     Hepatic Function Latest Ref Rng & Units 01/25/2019 06/04/2018 05/30/2018  Total Protein 6.0 - 8.5 g/dL 7.0 6.1(L) 5.8(L)  Albumin 3.7 - 4.7 g/dL 4.3 3.2(L) 3.6  AST 0 - 40 IU/L 21 29 36  ALT 0 - 32 IU/L 19 27 45(H)  Alk Phosphatase 39 - 117 IU/L 75 69 90  Total Bilirubin 0.0 - 1.2 mg/dL 0.3 0.9 0.5  Bilirubin, Direct 0.1 - 0.5 mg/dL - - -    CBC Latest Ref Rng & Units 08/07/2018 06/03/2018 06/02/2018  WBC 3.4 - 10.8 x10E3/uL 5.4 6.9 6.9  Hemoglobin 11.1 - 15.9 g/dL 13.2 11.6(L) 11.8(L)  Hematocrit 34.0 - 46.6 % 41.3 36.2 37.8  Platelets 150 - 450 x10E3/uL 221 228 239   Lab Results  Component Value  Date   MCV 85 08/07/2018   MCV 84.8 06/03/2018   MCV 84.8 06/02/2018   Lab Results  Component Value Date   TSH 2.617 11/20/2017   Lab Results  Component Value Date   HGBA1C 6.8 (H) 01/25/2019     BNP    Component Value Date/Time   BNP 3,381.1 (H) 06/04/2018 0754    ProBNP    Component  Value Date/Time   PROBNP 5,280 (H) 08/07/2018 1247     Lipid Panel     Component Value Date/Time   CHOL 158 01/25/2019 0907   TRIG 100 01/25/2019 0907   HDL 61 01/25/2019 0907   CHOLHDL 3.3 10/06/2017 0256   VLDL 22 10/06/2017 0256   LDLCALC 77 01/25/2019 0907     RADIOLOGY: No results found.   Additional studies/ records that were reviewed today include:  I reviewed her 3-week hospitalization records in detail.  I reviewed her follow-up office visit with [name].  Laboratory catheterization studies, echo Doppler assessment were reviewed.  I reviewed her hospitalization, and follow-up office visit since my last evaluation. Subsequent evaluation since my January 2020 evaluation were reviewed.   ASSESSMENT:    1. Ischemic cardiomyopathy   2. Chronic combined systolic (congestive) and diastolic (congestive) heart failure ([place])   3. STEMI involving left anterior descending coronary artery (HCC):10/05/2017   4. Essential hypertension   5. Stage 3b chronic kidney disease   6. Hyperlipidemia with target LDL less than 70     PLAN:   Ms. Marlin Brys is a very pleasant 79 year-old Hispanic female who presented with a late presentation anterior ST segment elevation MI.  She had an initial significant ischemic cardiomyopathy with an EF of 30% with grade 2 diastolic dysfunction and developed post infarct pericarditis treated with Decadron and colchicine and during her hospitalization and developed significant hyponatremia, acute kidney injury, with ultimate improvement.  Her creatinine has improved.   She has had issues with low blood pressure and was not on an ACE or ARB therapy due to previous renal insufficiency and low blood pressure.  Remotely, she had been started on hydralazine but this had to be discontinued due to low blood pressure.  Her last  follow-up echo still shows an EF of 30 to 35% and is concordant with her late presentation anterior MI.  Renal function ultimately  stabilized and since January 2020 she was started on Entresto and has been maintained on low-dose 24/26 mg twice daily.  She continues to be on isosorbide 30 mg, furosemide 20 mg on an as-needed basis in addition to carvedilol 6.25 mg twice a day.  She is euvolemic on exam.  She has noticed marked benefit since initiating Entresto.  There is no anginal symptoms.  There is no exertional dyspnea.  She is a Plavix nonresponder.  She has been on Brilinta since her intervention.  I will now reduce her dose to 60 mg twice a day per Pegasys trial data.  Now that she has been on Brilinta I am scheduling her for follow-up echo Doppler evaluation to see if there has been any improvement in LV function.  She is diabetic on glimepiride.  I will see her in 6 months for reevaluation or sooner if problems arise.  Time spent: 25 minutes  Medication Adjustments/Labs and Tests Ordered: Current medicines are reviewed at length with the patient today.  Concerns regarding medicines are outlined above.  Medication changes, Labs and Tests ordered today are listed in the Patient Instructions below.  Patient Instructions  Medication Instructions:  DECREASE- Brilinta 60 mg by mouth twice a day  *If you need a refill on your cardiac medications before your next appointment, please call your pharmacy*  Lab Work: None Ordered If you have labs (blood work) drawn today and your tests are completely normal, you will receive your results only by: Marland Kitchen MyChart Message (if you have MyChart) OR . A paper copy in the mail If you have any lab test that is abnormal or we need to change your treatment, we will call you to review the results.  Testing/Procedures: Your physician has requested that you have an echocardiogram. Echocardiography is a painless test that uses sound waves to create images of your heart. It provides your doctor with information about the size and shape of your heart and how well your heart's chambers and valves are  working. This procedure takes approximately one hour. There are no restrictions for this procedure.  Follow-Up: At [facility], you and your health needs are our priority.  As part of our continuing mission to provide you with exceptional heart care, we have created designated Provider Care Teams.  These Care Teams include your primary Cardiologist (physician) and Advanced Practice Providers (APPs -  Physician Assistants and Nurse Practitioners) who all work together to provide you with the care you need, when you need it.  Your next appointment:   6 month(s)  The format for your next appointment:   In Person  Provider:   Shelva Majestic, MD       Signed, Shelva Majestic, MD  07/19/2019 6:18 PM    [facility] [address] [place], [place], [place]  [address] Phone: [phone]

## 2019-07-19 ENCOUNTER — Encounter: Payer: Self-pay | Admitting: Cardiovascular Disease

## 2019-07-25 ENCOUNTER — Ambulatory Visit (HOSPITAL_COMMUNITY): Payer: Self-pay | Attending: Cardiovascular Disease

## 2019-07-25 ENCOUNTER — Other Ambulatory Visit: Payer: Self-pay

## 2019-07-25 DIAGNOSIS — I2102 ST elevation (STEMI) myocardial infarction involving left anterior descending coronary artery: Secondary | ICD-10-CM | POA: Insufficient documentation

## 2019-07-25 DIAGNOSIS — I255 Ischemic cardiomyopathy: Secondary | ICD-10-CM | POA: Insufficient documentation

## 2019-07-25 MED ORDER — PERFLUTREN LIPID MICROSPHERE
1.0000 mL | INTRAVENOUS | Status: AC | PRN
  Administered 2019-07-25: 1 mL via INTRAVENOUS

## 2019-08-02 ENCOUNTER — Ambulatory Visit: Payer: Self-pay | Admitting: Cardiovascular Disease

## 2019-08-08 ENCOUNTER — Other Ambulatory Visit: Payer: Self-pay

## 2019-08-08 ENCOUNTER — Ambulatory Visit: Payer: Self-pay | Admitting: Internal Medicine

## 2019-08-08 ENCOUNTER — Encounter: Payer: Self-pay | Admitting: Internal Medicine

## 2019-08-08 VITALS — BP 120/80 | HR 70 | Resp 12 | Ht <= 58 in | Wt 120.0 lb

## 2019-08-08 DIAGNOSIS — F329 Major depressive disorder, single episode, unspecified: Secondary | ICD-10-CM

## 2019-08-08 DIAGNOSIS — E119 Type 2 diabetes mellitus without complications: Secondary | ICD-10-CM

## 2019-08-08 DIAGNOSIS — E785 Hyperlipidemia, unspecified: Secondary | ICD-10-CM

## 2019-08-08 DIAGNOSIS — N1832 Chronic kidney disease, stage 3b: Secondary | ICD-10-CM

## 2019-08-08 DIAGNOSIS — M7581 Other shoulder lesions, right shoulder: Secondary | ICD-10-CM

## 2019-08-08 DIAGNOSIS — I255 Ischemic cardiomyopathy: Secondary | ICD-10-CM

## 2019-08-08 DIAGNOSIS — I10 Essential (primary) hypertension: Secondary | ICD-10-CM

## 2019-08-08 DIAGNOSIS — F419 Anxiety disorder, unspecified: Secondary | ICD-10-CM

## 2019-08-08 NOTE — Progress Notes (Signed)
-    Subjective:    Patient ID: Sheila White, female   DOB: 11-Dec-1940, 79 y.o.   MRN: [id number]   HPI   1.  Ischemic cardiomyopathy:  Had echo end of last month.  EF 25-30% with Aortic sclerosis and Grade 3 diastolic dysfunction. Dr. Claiborne Billings wants her to come back in 1-2 months for follow up rather than 6 months.   Daughter feels her mom was quite anxious and upset for the last echo as she was separated from her daughter and had to have another interpreter.  Apparently had to have IV sedation due to being upset. Discussed she should ask to interpret for the next check. She feels she is breathing well. Weight is stable.   No chest pain. Does walk in the house without dyspnea.  At times walks outside.   Sleeping well.  Sleeps flat. No PND or orthopnea.   No LE edema.   Brilinta decreased to 60 mg twice daily.  ASA 81 mg daily. Entresto 24-26 mg twice daily, Carvedilol 6.25 mg twice daily. Isosorbide mononitrate 30 mg daily. Furosemide 20 mg daily only as needed--has not needed for 2 months.  2.   Depression:  Doing well on 25 mg of Sertraline.   3.  DM:  Glimepiride 2 mg with breakfast.  She is generally running in low 100s fasting.  Last A1C in July 2020 was 6.8%.  Physical activity as above.  Feels she is eating in a healthy way.  4.  Right upper arm and shoulder pain:  Started complaining about 2 weeks ago.  History of a fall onto that arm 7 years ago, which she feels is the cause.  Sounds like it hurts to flex or abduct.    Current Meds  Medication Sig  . aspirin 81 MG chewable tablet Chew 1 tablet (81 mg total) by mouth daily.  Marland Kitchen atorvastatin (LIPITOR) 80 MG tablet Take 1 tablet (80 mg total) by mouth daily.  . carvedilol (COREG) 6.25 MG tablet Take 6.25 mg by mouth 2 (two) times daily with a meal.  . famotidine (PEPCID) 20 MG tablet TAKE 2 TABLETS BY MOUTH AT BEDTIME  . furosemide (LASIX) 20 MG tablet Take 1 tablet (20 mg total) by mouth daily as needed for  fluid.  Marland Kitchen glimepiride (AMARYL) 2 MG tablet Take 1 tablet by mouth once daily with breakfast  . isosorbide mononitrate (IMDUR) 30 MG 24 hr tablet Take 1 tablet by mouth once daily  . loratadine (CLARITIN) 10 MG tablet Take 1 tablet (10 mg total) by mouth daily.  . nitroGLYCERIN (NITROSTAT) 0.4 MG SL tablet Place 1 tablet (0.4 mg total) under the tongue every 5 (five) minutes x 3 doses as needed for chest pain.  . sacubitril-valsartan (ENTRESTO) 24-26 MG Take 1 tablet by mouth 2 (two) times daily.  . sertraline (ZOLOFT) 50 MG tablet Take 0.5 tablets (25 mg total) by mouth at bedtime.  . ticagrelor (BRILINTA) 60 MG TABS tablet Take 1 tablet (60 mg total) by mouth 2 (two) times daily.  Marland Kitchen triamcinolone cream (KENALOG) 0.1 % Apply 1 application topically 2 (two) times daily.   No Known Allergies   Review of Systems    Objective:   BP 120/80 (BP Location: Left Arm, Patient Position: Sitting, Cuff Size: Normal)   Pulse 70   Resp 12   Ht 4\' 10"  (1.473 m)   Wt 120 lb (54.4 kg)   BMI 25.08 kg/m   Physical Exam  NAD Lungs:  CTA save for faint dry crackles in right posterior base (chronic) CV:  RRR with normal S1 and S2, No S3, S4 or murmur.  Carotid and radial pulses normal and equal.  Decreased DP and PT pulses bilaterally. Abd:  S NT, No HSM or mass, + BS LE:  No edema.   Right shoulder:  Pain on abduction and flexion above 90 degrees, though able to fully abduct and flex.   Tender across right trap, subacromial bursa area, AC joint.  Less tender over CC joint.     Assessment & Plan  1.  Ischemic cardiomyopathy:  Clinically compensated.   Daughter will call Dr. Landry Dyke office to set up follow up. She will ask if she can accompany her mother and interpret for her in future to prevent panic in her mother.  2.  DM:  Sounds controlled.  A1C with upcoming fasting labs.  3.  Hypertension:  Controlled. Hold on pneumovax until she is done with COVID 19  4.  Depression/Anxiety:   Controlled on low dose Sertraline.  5.  Dyslipidemia:  FLP in next week.  6.  Anemia:  CBC with labs  7.  CKD:  CMP--has had mildly elevated K+--need to recheck.  8.  Right rotator cuff tendinitis:  PT.  No NSAIDS.

## 2019-08-11 ENCOUNTER — Other Ambulatory Visit: Payer: Self-pay | Admitting: Internal Medicine

## 2019-08-16 ENCOUNTER — Other Ambulatory Visit: Payer: Self-pay

## 2019-08-20 ENCOUNTER — Other Ambulatory Visit (INDEPENDENT_AMBULATORY_CARE_PROVIDER_SITE_OTHER): Payer: Self-pay

## 2019-08-20 ENCOUNTER — Other Ambulatory Visit: Payer: Self-pay

## 2019-08-20 DIAGNOSIS — E119 Type 2 diabetes mellitus without complications: Secondary | ICD-10-CM

## 2019-08-20 DIAGNOSIS — R8290 Unspecified abnormal findings in urine: Secondary | ICD-10-CM

## 2019-08-20 DIAGNOSIS — E785 Hyperlipidemia, unspecified: Secondary | ICD-10-CM

## 2019-08-20 DIAGNOSIS — Z79899 Other long term (current) drug therapy: Secondary | ICD-10-CM

## 2019-08-20 LAB — POCT URINALYSIS DIPSTICK
Bilirubin, UA: NEGATIVE
Glucose, UA: NEGATIVE
Ketones, UA: NEGATIVE
Nitrite, UA: NEGATIVE
Protein, UA: POSITIVE — AB
Spec Grav, UA: 1.01 (ref 1.010–1.025)
Urobilinogen, UA: 0.2 E.U./dL
pH, UA: 6 (ref 5.0–8.0)

## 2019-08-20 NOTE — Addendum Note (Signed)
Addended by: Marcelino Freestone on: 08/20/2019 09:59 AM   Modules accepted: Orders

## 2019-08-21 LAB — CBC WITH DIFFERENTIAL/PLATELET
Basophils Absolute: 0 10*3/uL (ref 0.0–0.2)
Basos: 1 %
EOS (ABSOLUTE): 0.3 10*3/uL (ref 0.0–0.4)
Eos: 4 %
Hematocrit: 35.4 % (ref 34.0–46.6)
Hemoglobin: 11.4 g/dL (ref 11.1–15.9)
Immature Grans (Abs): 0 10*3/uL (ref 0.0–0.1)
Immature Granulocytes: 0 %
Lymphocytes Absolute: 1.5 10*3/uL (ref 0.7–3.1)
Lymphs: 21 %
MCH: 28.4 pg (ref 26.6–33.0)
MCHC: 32.2 g/dL (ref 31.5–35.7)
MCV: 88 fL (ref 79–97)
Monocytes Absolute: 0.6 10*3/uL (ref 0.1–0.9)
Monocytes: 8 %
Neutrophils Absolute: 4.8 10*3/uL (ref 1.4–7.0)
Neutrophils: 66 %
Platelets: 237 10*3/uL (ref 150–450)
RBC: 4.01 x10E6/uL (ref 3.77–5.28)
RDW: 13.2 % (ref 11.7–15.4)
WBC: 7.2 10*3/uL (ref 3.4–10.8)

## 2019-08-21 LAB — COMPREHENSIVE METABOLIC PANEL
ALT: 25 IU/L (ref 0–32)
AST: 23 IU/L (ref 0–40)
Albumin/Globulin Ratio: 1.6 (ref 1.2–2.2)
Albumin: 4.1 g/dL (ref 3.6–4.6)
Alkaline Phosphatase: 98 IU/L (ref 39–117)
BUN/Creatinine Ratio: 40 — ABNORMAL HIGH (ref 12–28)
BUN: 54 mg/dL — ABNORMAL HIGH (ref 8–27)
Bilirubin Total: <0.2 mg/dL (ref 0.0–1.2)
CO2: 17 mmol/L — ABNORMAL LOW (ref 20–29)
Calcium: 8.9 mg/dL (ref 8.7–10.3)
Chloride: 103 mmol/L (ref 96–106)
Creatinine, Ser: 1.36 mg/dL — ABNORMAL HIGH (ref 0.57–1.00)
GFR calc Af Amer: 42 mL/min/{1.73_m2} — ABNORMAL LOW (ref >59)
GFR calc non Af Amer: 37 mL/min/{1.73_m2} — ABNORMAL LOW (ref >59)
Globulin, Total: 2.6 g/dL (ref 1.5–4.5)
Glucose: 125 mg/dL — ABNORMAL HIGH (ref 65–99)
Potassium: 5.5 mmol/L — ABNORMAL HIGH (ref 3.5–5.2)
Sodium: 135 mmol/L (ref 134–144)
Total Protein: 6.7 g/dL (ref 6.0–8.5)

## 2019-08-21 LAB — LIPID PANEL W/O CHOL/HDL RATIO
Cholesterol, Total: 141 mg/dL (ref 100–199)
HDL: 59 mg/dL (ref >39)
LDL Chol Calc (NIH): 66 mg/dL (ref 0–99)
Triglycerides: 85 mg/dL (ref 0–149)
VLDL Cholesterol Cal: 16 mg/dL (ref 5–40)

## 2019-08-21 LAB — HGB A1C W/O EAG: Hgb A1c MFr Bld: 7.5 % — ABNORMAL HIGH (ref 4.8–5.6)

## 2019-08-21 LAB — MICROALBUMIN / CREATININE URINE RATIO
Creatinine, Urine: 29.5 mg/dL
Microalb/Creat Ratio: 263 mg/g creat — ABNORMAL HIGH (ref 0–29)
Microalbumin, Urine: 77.5 ug/mL

## 2019-08-22 LAB — URINE CULTURE

## 2019-08-22 MED ORDER — CIPROFLOXACIN HCL 250 MG PO TABS: ORAL_TABLET | ORAL | 0 refills | Status: DC

## 2019-08-22 NOTE — Addendum Note (Signed)
Addended by: Marcene Duos on: 08/22/2019 04:27 PM   Modules accepted: Orders

## 2019-08-24 ENCOUNTER — Other Ambulatory Visit: Payer: Self-pay | Admitting: Cardiology

## 2019-08-24 ENCOUNTER — Ambulatory Visit (INDEPENDENT_AMBULATORY_CARE_PROVIDER_SITE_OTHER): Payer: Self-pay | Admitting: Cardiovascular Disease

## 2019-08-24 ENCOUNTER — Other Ambulatory Visit: Payer: Self-pay

## 2019-08-24 ENCOUNTER — Telehealth: Payer: Self-pay | Admitting: Cardiology

## 2019-08-24 VITALS — BP 110/64 | HR 75 | Temp 97.1°F | Ht 60.0 in | Wt 122.0 lb

## 2019-08-24 DIAGNOSIS — E785 Hyperlipidemia, unspecified: Secondary | ICD-10-CM

## 2019-08-24 DIAGNOSIS — I2102 ST elevation (STEMI) myocardial infarction involving left anterior descending coronary artery: Secondary | ICD-10-CM

## 2019-08-24 DIAGNOSIS — N1832 Chronic kidney disease, stage 3b: Secondary | ICD-10-CM

## 2019-08-24 DIAGNOSIS — Z79899 Other long term (current) drug therapy: Secondary | ICD-10-CM

## 2019-08-24 DIAGNOSIS — E875 Hyperkalemia: Secondary | ICD-10-CM

## 2019-08-24 DIAGNOSIS — I5042 Chronic combined systolic (congestive) and diastolic (congestive) heart failure: Secondary | ICD-10-CM

## 2019-08-24 DIAGNOSIS — I10 Essential (primary) hypertension: Secondary | ICD-10-CM

## 2019-08-24 DIAGNOSIS — I2510 Atherosclerotic heart disease of native coronary artery without angina pectoris: Secondary | ICD-10-CM

## 2019-08-24 DIAGNOSIS — I255 Ischemic cardiomyopathy: Secondary | ICD-10-CM

## 2019-08-24 LAB — BASIC METABOLIC PANEL
BUN/Creatinine Ratio: 37 — ABNORMAL HIGH (ref 12–28)
BUN: 69 mg/dL — ABNORMAL HIGH (ref 8–27)
CO2: 15 mmol/L — ABNORMAL LOW (ref 20–29)
Calcium: 9.2 mg/dL (ref 8.7–10.3)
Chloride: 103 mmol/L (ref 96–106)
Creatinine, Ser: 1.87 mg/dL — ABNORMAL HIGH (ref 0.57–1.00)
GFR calc Af Amer: 29 mL/min/{1.73_m2} — ABNORMAL LOW (ref >59)
GFR calc non Af Amer: 25 mL/min/{1.73_m2} — ABNORMAL LOW (ref >59)
Glucose: 187 mg/dL — ABNORMAL HIGH (ref 65–99)
Potassium: 5.8 mmol/L (ref 3.5–5.2)
Sodium: 131 mmol/L — ABNORMAL LOW (ref 134–144)

## 2019-08-24 NOTE — Telephone Encounter (Signed)
Received a call from [facility] regarding patient K+ of 5.8 on BMET from today. I called and talked with her daughter and asked that she hold off on taking her Entresto this evening and through the weekend. Will need repeat labs on Monday. Will place order and route message to primary cardiologist. Daughter expressed understanding and thanked for me for the follow up call.

## 2019-08-24 NOTE — Patient Instructions (Addendum)
Medication Instructions:  CONTINUE WITH CURRENT MEDICATIONS. NO CHANGES.  *If you need a refill on your cardiac medications before your next appointment, please call your pharmacy*   Lab Work: Today: BMET If you have labs (blood work) drawn today and your tests are completely normal, you will receive your results only by: Marland Kitchen MyChart Message (if you have MyChart) OR . A paper copy in the mail If you have any lab test that is abnormal or we need to change your treatment, we will call you to review the results.  Follow-Up: At [facility], you and your health needs are our priority.  As part of our continuing mission to provide you with exceptional heart care, we have created designated Provider Care Teams.  These Care Teams include your primary Cardiologist (physician) and Advanced Practice Providers (APPs -  Physician Assistants and Nurse Practitioners) who all work together to provide you with the care you need, when you need it.  We recommend signing up for the patient portal called "MyChart".  Sign up information is provided on this After Visit Summary.  MyChart is used to connect with patients for Virtual Visits (Telemedicine).  Patients are able to view lab/test results, encounter notes, upcoming appointments, etc.  Non-urgent messages can be sent to your provider as well.   To learn more about what you can do with MyChart, go to [email or web address].    Your next appointment:   4 month(s)  The format for your next appointment:   In Person  Provider:   Nicki Guadalajara, MD   Other Instructions Referral placed to [facility]-[address] Dr.Taylor  Print out given for high potassium foods to avoid  tricagrelor 60mg  samples given

## 2019-08-24 NOTE — Progress Notes (Signed)
Cardiology Office Note    Date:  08/25/2019   ID:  Scottie Stanish, [place] 12-16-1940, MRN [id number]  PCP:  Mack Hook, MD  Cardiologist:  Shelva Majestic, MD   One month F/u office visit in follow-up of her recent echo Doppler evaluation.  History of Present Illness:  Sheila Boltz is a 79 y.o. female who is originally from [name].  She presented to [facility] on October 05, 2017 with an ST segment elevation anterior myocardial infarction with late presentation.  Her symptoms had developed the day prior to admission and became more progressive with reference to shortness of breath and continued chest pain.  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  She had 50% proximal OM1 stenosis followed by 95% distal marginal stenosis and there was mild irregularity of the RCA.  She underwent successful PCI to the LAD with ultimate insertion of a 2.5 x 26 mm Resolute DES stent postdilated 2.8 proximally and 2.68 distally with a 99% stenosis reduced to 0%.  The mid LAD stenosis was treated with DES stenting with a 2 to 5 x 12 mm Resolute stent.  The apical stenosis was treated with PTCA.  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  Her peak creatinine was 3.86 which improved to 1.88 at discharge.  He was ultimately discharged on October 24, 2017 and on Oct 28, 2017 so Sheila White for her initial office evaluation.  Patient has had issues with chronic cough.  Her pericardial symptoms had improved and she was treated with colchicine.    I saw her for initial post hospital follow-up evaluation on Nov 18, 2017.  At that time she denied any recurrent chest pain symptomatology.  She was tentatively scheduled to travel to [place] several days later and I recommended that she postpone  this trip.  Since I saw her, she  developed some chest pain and was hospitalized on May 25 through Nov 22, 2017.  Troponins were normal.  Follow-up echo Doppler study showed an EF of 30 to 35%.  There was mid distal anterior, apical inferoapical severe hypokinesis secondary to her prior LAD territory infarct.  There was grade 1 diastolic dysfunction.  She also complained of some blurry vision with gait abnormality.  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  She was hyponatremic which revolved resolved.  She was seen by Sheila White post discharge on December 01, 2017.  At that time, her blood pressure was stable and pulse 77.  I saw her in June 2019.  At that time the plan was to start Plavix due to her foreign status and apparently Plavix only rather than Brilinta could be used.  However, P2Y12 test demonstrated that she was not responsive to Plavix.  As a result we have been supplying her with samples of Brilinta.   When I saw her in September 2019 her renal function was gradually improving.  She was not having any anginal symptoms and her blood pressure was stable.  I last saw her in the office in January 2020 at which time she continued to feel well.    Most recent laboratory has shown further improvement in renal function with a creatinine of 1.48 improved from 1.61 in December 2019.  She has continued to be stable.  She has been taking aspirin and Brilinta for DAPT.  She  is on atorvastatin 80 mg for hyperlipidemia with target LDL less than 70.  She is unaware of any palpitations and continues to take carvedilol 6.25 mg twice a day.  She has been taking furosemide 40 mg daily without edema.  Renal function had stabilized with creatinines in the 1.4 range.  At that time, I elected to initiate low-dose Entresto in light of her continued LV dysfunction EF at 30 to 35%.  There were no signs of overload.  I recommended follow-up laboratory 2 weeks later and reassessment with our pharmacist.  She was   evaluated by Almyra Deforest, PA in a telemedicine visit in April 2020.  She was doing well and her Lasix dose had been reduced to allow the addition of Entresto at her prior evaluation.  Since her blood pressure had been low at the pharmacy evaluation she continues to be on the 24/26 mg twice daily regimen of Entresto.  She is a Plavix nonresponder and for this reason has been maintained on Brilinta.  I saw her in July 17, 2019 for 1 year evaluation.  She  continued to do well.  She specifically denied chest pain or shortness of breath.  She has noticed significant benefit with the initiation of Entresto.  She had not had recent laboratory since July 2020 and at that time creatinine was 1.55.  Lipid studies revealed a cholesterol of 158, HDL 61, LDL 77 and triglycerides 100.  During that evaluation, I recommended that she undergo a follow-up echo Doppler study to reassess LV function.  She continues to be on aspirin/Brilinta and at that time per Pegasys trial data I reduced her Brilinta dose to 60 mg twice a day.  I recommended she undergo follow-up laboratory.    Her most recent echo Doppler study of July 25, 2019 continues to show reduced LV function with EF of 25 to 30%.  There is mild LVH.  She had grade 3, restrictive physiology diastolic dysfunction.  She had moderate dilation of her left atrium.  There was moderate mitral annular calcification with mild MR, mild TR and mild PR.  She has continued to feel well.  She undergo went repeat laboratory 4 days prior to her evaluation and presents for follow-up.  Past Medical History:  Diagnosis Date  . Acute combined systolic and diastolic CHF, NYHA class 3 ([place]) 10/10/2017   in setting of MI  . Acute cystitis without hematuria   . Acute on chronic systolic congestive heart failure ([place]) 10/20/2017  . Anxiety and depression 12/19/2017  . Chest pain 10/31/2017  . Chest pain, rule out acute myocardial infarction 11/19/2017  . Coronary artery disease 10/31/2017    . Dyspnea 10/05/2017  . Heart failure ([place]) 12/23/2017  . HOH (hard of hearing)   . Ischemic cardiomyopathy 10/05/2017  . MI, acute, non ST segment elevation ([place])   . Non-insulin dependent type 2 diabetes mellitus ([place]) 2004  . 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 STEMI involving left anterior descending coronary artery ([place]) 10/05/2017    Past Surgical History:  Procedure Laterality Date  . CORONARY/GRAFT ACUTE MI REVASCULARIZATION N/A 10/05/2017   Procedure: Coronary/Graft Acute MI Revascularization;  Surgeon: Troy Sine, MD;  Location: [facility];  Service: Cardiovascular;  Laterality: N/A;  . LEFT HEART CATH AND CORONARY ANGIOGRAPHY N/A 10/05/2017   Procedure: LEFT HEART CATH AND CORONARY ANGIOGRAPHY;  Surgeon: Troy Sine, MD;  Location: [facility];  Service: Cardiovascular;  Laterality: N/A;    Current Medications: Outpatient  Medications Prior to Visit  Medication Sig Dispense Refill  . aspirin 81 MG chewable tablet Chew 1 tablet (81 mg total) by mouth daily. 60 tablet 2  . atorvastatin (LIPITOR) 80 MG tablet Take 1 tablet (80 mg total) by mouth daily. 30 tablet 7  . carvedilol (COREG) 6.25 MG tablet Take 6.25 mg by mouth 2 (two) times daily with a meal.    . ciprofloxacin (CIPRO) 250 MG tablet 1 tab by mouth twice daily for 7 days. 14 tablet 0  . famotidine (PEPCID) 20 MG tablet TAKE 2 TABLETS BY MOUTH AT BEDTIME 120 tablet 0  . furosemide (LASIX) 20 MG tablet Take 1 tablet (20 mg total) by mouth daily as needed for fluid. 30 tablet 1  . glimepiride (AMARYL) 2 MG tablet Take 1 tablet by mouth once daily with breakfast 30 tablet 11  . isosorbide mononitrate (IMDUR) 30 MG 24 hr tablet Take 1 tablet by mouth once daily 90 tablet 0  . loratadine (CLARITIN) 10 MG tablet Take 1 tablet (10 mg total) by mouth daily. 60 tablet 2  . nitroGLYCERIN (NITROSTAT) 0.4 MG SL tablet Place 1 tablet (0.4 mg total) under the tongue every 5 (five) minutes x 3 doses  as needed for chest pain. 25 tablet 2  . sacubitril-valsartan (ENTRESTO) 24-26 MG Take 1 tablet by mouth 2 (two) times daily. 60 tablet 11  . sertraline (ZOLOFT) 50 MG tablet Take 0.5 tablets (25 mg total) by mouth at bedtime. 15 tablet 11  . ticagrelor (BRILINTA) 60 MG TABS tablet Take 1 tablet (60 mg total) by mouth 2 (two) times daily. 60 tablet 6  . triamcinolone cream (KENALOG) 0.1 % Apply 1 application topically 2 (two) times daily. 30 g 1   No facility-administered medications prior to visit.     Allergies:   Patient has no known allergies.   Social History   Socioeconomic History  . Marital status: Widowed    Spouse name: Not on file  . Number of children: 8  . Years of education: 6  . Highest education level: Not on file  Occupational History  . Occupation: Cabin crew, retired  Tobacco Use  . Smoking status: Never Smoker  . Smokeless tobacco: Never Used  Substance and Sexual Activity  . Alcohol use: Never  . Drug use: Never  . Sexual activity: Not Currently  Other Topics Concern  . Not on file  Social History Narrative   Living with daughter, Sheila White and her husband and her 3 sons.   Social Determinants of Health   Financial Resource Strain:   . Difficulty of Paying Living Expenses: Not on file  Food Insecurity:   . Worried About Charity fundraiser in the Last Year: Not on file  . Ran Out of Food in the Last Year: Not on file  Transportation Needs:   . Lack of Transportation (Medical): Not on file  . Lack of Transportation (Non-Medical): Not on file  Physical Activity:   . Days of Exercise per Week: Not on file  . Minutes of Exercise per Session: Not on file  Stress:   . Feeling of Stress : Not on file  Social Connections:   . Frequency of Communication with Friends and Family: Not on file  . Frequency of Social Gatherings with Friends and Family: Not on file  . Attends Religious Services: Not on file  . Active Member of Clubs or Organizations: Not on  file  . Attends Archivist Meetings: Not on file  .  Marital Status: Not on file     Family History:  The patient's family history includes Alcohol abuse in her father.   ROS General: Negative; No fevers, chills, or night sweats;  HEENT: Negative; No changes in vision or hearing, sinus congestion, difficulty swallowing Pulmonary: Negative; No cough, wheezing, shortness of breath, hemoptysis Cardiovascular: See HPI GI: Obvious nausea, resolved Musculoskeletal: Negative; no myalgias, joint pain, or weakness Hematologic/Oncology: Negative; no easy bruising, bleeding Endocrine: Negative; no heat/cold intolerance; no diabetes Neuro: Negative; no changes in balance, headaches; previous dizziness resolved Skin: Negative; No rashes or skin lesions Psychiatric: Negative; No behavioral problems, depression Sleep: Negative; No snoring, daytime sleepiness, hypersomnolence, bruxism, restless legs, hypnogognic hallucinations, no cataplexy Other comprehensive 14 point system review is negative.   PHYSICAL EXAM:   VS:  BP 110/64   Pulse 75   Temp (!) 97.1 F (36.2 C)   Ht 5' (1.524 m)   Wt 122 lb (55.3 kg)   SpO2 97%   BMI 23.83 kg/m     Repeat blood pressure by me was 108/60  Wt Readings from Last 3 Encounters:  08/24/19 122 lb (55.3 kg)  08/08/19 120 lb (54.4 kg)  07/17/19 123 lb (55.8 kg)    General: Alert, oriented, no distress.  Skin: normal turgor, no rashes, warm and dry HEENT: Normocephalic, atraumatic. Pupils equal round and reactive to light; sclera anicteric; extraocular muscles intact;  Nose without nasal septal hypertrophy Mouth/Parynx benign; Mallinpatti scale 3 Neck: No JVD, no carotid bruits; normal carotid upstroke Lungs: clear to ausculatation and percussion; no wheezing or rales Chest wall: without tenderness to palpitation Heart: PMI not displaced, RRR, s1 s2 normal, 1/6 systolic murmur, no diastolic murmur, no rubs, gallops, thrills, or heaves Abdomen:  soft, nontender; no hepatosplenomehaly, BS+; abdominal aorta nontender and not dilated by palpation. Back: no CVA tenderness Pulses 2+ Musculoskeletal: full range of motion, normal strength, no joint deformities Extremities: no clubbing cyanosis or edema, Homan's sign negative  Neurologic: grossly nonfocal; Cranial nerves grossly wnl Psychologic: Normal mood and affect   Studies/Labs Reviewed:   EKG:  EKG is ordered today.  ECG (independently read by me): Normal sinus rhythm at 75 bpm.  QS complex V1 through V5, 1 and aVL with T wave inversion V3 through V6, 1 and aVL consistent with large old anterolateral lateral myocardial infarction.  No ectopy.  QTc interval 419 ms.  January 2021 ECG (independently read by me):Normal sinus rhythm at 67 bpm.  Left anterior hemiblock.  Old anterior MI with Q waves and poor R wave progression throughout the entire precordium  July 27, 2018 ECG (independently read by me): Normal sinus rhythm at 62 bpm.  Old inferior MI and anterior MI.  Left axis deviation.  No ectopy.  QTc interval 430 ms.  March 27, 2018 ECG (independently read by me): Normal sinus rhythm at 78 bpm.  Old anterior MI with QS V1 through V5 and inferior Q waves.  No ectopy.  December 20, 2017 ECG (independently read by me): Normal sinus rhythm 70 bpm.  Old anterior MI with poor progression V1 through V5 with Q waves noted in 3 and aVF.  Normal intervals.  No ectopy.  11/18/2017 ECG (independently read by me): Sinus rhythm at 86 bpm.  Left anterior hemiblock.  QS complex V1 through V5 consistent with anterior infarct.  Inferior Q waves suggestive of inferior MI.  Normal intervals.  No ectopy.  Recent Labs: BMP Latest Ref Rng & Units 08/24/2019 08/20/2019 02/16/2019  Glucose 65 -  99 mg/dL 187(H) 125(H) 252(H)  BUN 8 - 27 mg/dL 69(H) 54(H) 50(H)  Creatinine 0.57 - 1.00 mg/dL 1.87(H) 1.36(H) 1.69(H)  BUN/Creat Ratio 12 - 28 37(H) 40(H) 30(H)  Sodium 134 - 144 mmol/L 131(L) 135 136  Potassium  3.5 - 5.2 mmol/L 5.8(HH) 5.5(H) 5.3(H)  Chloride 96 - 106 mmol/L 103 103 101  CO2 20 - 29 mmol/L 15(L) 17(L) 21  Calcium 8.7 - 10.3 mg/dL 9.2 8.9 8.8     Hepatic Function Latest Ref Rng & Units 08/20/2019 01/25/2019 06/04/2018  Total Protein 6.0 - 8.5 g/dL 6.7 7.0 6.1(L)  Albumin 3.6 - 4.6 g/dL 4.1 4.3 3.2(L)  AST 0 - 40 IU/L '23 21 29  ' ALT 0 - 32 IU/L '25 19 27  ' Alk Phosphatase 39 - 117 IU/L 98 75 69  Total Bilirubin 0.0 - 1.2 mg/dL <0.2 0.3 0.9  Bilirubin, Direct 0.1 - 0.5 mg/dL - - -    CBC Latest Ref Rng & Units 08/20/2019 08/07/2018 06/03/2018  WBC 3.4 - 10.8 x10E3/uL 7.2 5.4 6.9  Hemoglobin 11.1 - 15.9 g/dL 11.4 13.2 11.6(L)  Hematocrit 34.0 - 46.6 % 35.4 41.3 36.2  Platelets 150 - 450 x10E3/uL 237 221 228   Lab Results  Component Value Date   MCV 88 08/20/2019   MCV 85 08/07/2018   MCV 84.8 06/03/2018   Lab Results  Component Value Date   TSH 2.617 11/20/2017   Lab Results  Component Value Date   HGBA1C 7.5 (H) 08/20/2019     BNP    Component Value Date/Time   BNP 3,381.1 (H) 06/04/2018 0754    ProBNP    Component Value Date/Time   PROBNP 5,280 (H) 08/07/2018 1247     Lipid Panel     Component Value Date/Time   CHOL 141 08/20/2019 0856   TRIG 85 08/20/2019 0856   HDL 59 08/20/2019 0856   CHOLHDL 3.3 10/06/2017 0256   VLDL 22 10/06/2017 0256   LDLCALC 66 08/20/2019 0856     RADIOLOGY: No results found.   Additional studies/ records that were reviewed today include:  I reviewed her 3-week hospitalization records in detail.  I reviewed her follow-up office visit with Sheila White.  Laboratory catheterization studies, echo Doppler assessment were reviewed.  I reviewed her hospitalization, and follow-up office visit since my last evaluation. Subsequent evaluation since my January 2020 evaluation were reviewed.   ECHO 07/25/2019 IMPRESSIONS  1. Left ventricular ejection fraction, by visual estimation, is 25 to  30%. The left ventricle has  severely decreased function. There is mildly  increased left ventricular hypertrophy.  2. Definity contrast agent was given IV to delineate the left ventricular  endocardial borders.  3. Left ventricular diastolic parameters are consistent with Grade III  diastolic dysfunction (restrictive).  4. Severely dilated left ventricular internal cavity size.  5. The left ventricle demonstrates regional wall motion abnormalities.  6. Only basal function preserved mid and apical inferior, anterior,  septum and apex severely hypokinetic.  7. Global right ventricle has normal systolic function.The right  ventricular size is normal. No increase in right ventricular wall  thickness.  8. Left atrial size was moderately dilated.  9. Right atrial size was normal.  10. Moderate calcification of the mitral valve leaflet(s).  11. Moderate mitral annular calcification.  12. Moderate thickening of the mitral valve leaflet(s).  13. The mitral valve is normal in structure. Mild mitral valve  regurgitation.  14. The tricuspid valve is normal in structure.  15. The tricuspid  valve is normal in structure. Tricuspid valve  regurgitation is mild.  16. The aortic valve is tricuspid. Aortic valve regurgitation is mild.  severe sclerosis especially the non coronary cusp.  17. Pulmonic regurgitation is mild.  18. The pulmonic valve was grossly normal. Pulmonic valve regurgitation is  mild.   ASSESSMENT:    1. Ischemic cardiomyopathy   2. Chronic combined systolic (congestive) and diastolic (congestive) heart failure ([place])   3. STEMI involving left anterior descending coronary artery [facility]): Late presentation on October 05, 2017   4. Coronary artery disease involving native coronary artery of native heart without angina pectoris   5. Essential hypertension   6. Stage 3b chronic kidney disease   7. Hyperkalemia   8. Hyperlipidemia with target LDL less than 70     PLAN:   Ms. Sheila White is a very  pleasant 79 year-old Hispanic female who presented with a late presentation anterior ST segment elevation MI.  She had an initial significant ischemic cardiomyopathy with an EF of 30% with grade 2 diastolic dysfunction and developed post infarct pericarditis treated with Decadron and colchicine and during her hospitalization and developed significant hyponatremia, acute kidney injury, with ultimate improvement.  Her creatinine has improved.   She has had issues with low blood pressure and was not on an ACE or ARB therapy due to previous renal insufficiency and low blood pressure.  Remotely, she had been started on hydralazine but this had to be discontinued due to low blood pressure.  Her  follow-up echo still shows an EF of 30 to 35% and is concordant with her late presentation anterior MI.  Renal function ultimately stabilized and since January 2020 she was started on Entresto and has been maintained on low-dose 24/26 mg twice daily.  She continues to be on isosorbide 30 mg, furosemide 20 mg on an as-needed basis in addition to carvedilol 6.25 mg twice a day.  She is euvolemic on exam.  She has noticed marked benefit since initiating Entresto.  There is no anginal symptoms.  There is no exertional dyspnea.  She is a Plavix nonresponder and has been on Brilinta since her intervention.  When I saw her in January 2021 I reduced her Brilinta dose to 60 mg twice a day per Pegasys trial data.  I recommended a follow-up echo Doppler study which was done on July 25, 2019.  I reviewed this study with her in detail.  The present study continues to show severe LV dysfunction with EF of 25 to 30% and restrictive physiology grade 3 diastolic dysfunction.  Most recent laboratory done 4 days ago 1 August 20, 2019 shows further improvement in creatinine at 1.36.  However, potassium is 5.5.  I will repeat her bmet.  I discussed avoidance of potassium containing foods.  With her continued LV dysfunction I also had a lengthy  discussion with both she, her daughter, and an interpreter regarding prophylactic ICD implantation in light of her persistent ischemic cardiomyopathy.  I will refer her to our electrophysiology colleagues for further discussion and consideration of ICD therapy.  She is not having any anginal symptoms.  She continues to be on carvedilol 6.25 mg twice a day, as needed furosemide, and Entresto 24/26 mg twice daily.  She is diabetic on glimepiride.  I will see her in 4 months for reevaluation.    Medication Adjustments/Labs and Tests Ordered: Current medicines are reviewed at length with the patient today.  Concerns regarding medicines are outlined above.  Medication changes,  Labs and Tests ordered today are listed in the Patient Instructions below. Patient Instructions  Medication Instructions:  CONTINUE WITH CURRENT MEDICATIONS. NO CHANGES.  *If you need a refill on your cardiac medications before your next appointment, please call your pharmacy*   Lab Work: Today: BMET If you have labs (blood work) drawn today and your tests are completely normal, you will receive your results only by: Marland Kitchen MyChart Message (if you have MyChart) OR . A paper copy in the mail If you have any lab test that is abnormal or we need to change your treatment, we will call you to review the results.  Follow-Up: At [facility], you and your health needs are our priority.  As part of our continuing mission to provide you with exceptional heart care, we have created designated Provider Care Teams.  These Care Teams include your primary Cardiologist (physician) and Advanced Practice Providers (APPs -  Physician Assistants and Nurse Practitioners) who all work together to provide you with the care you need, when you need it.  We recommend signing up for the patient portal called "MyChart".  Sign up information is provided on this After Visit Summary.  MyChart is used to connect with patients for Virtual Visits  (Telemedicine).  Patients are able to view lab/test results, encounter notes, upcoming appointments, etc.  Non-urgent messages can be sent to your provider as well.   To learn more about what you can do with MyChart, go to [email or web address].    Your next appointment:   4 month(s)  The format for your next appointment:   In Person  Provider:   Shelva Majestic, MD   Other Instructions Referral placed to ElectroPhysiology-[address] Dr.Taylor  Print out given for high potassium foods to avoid  tricagrelor 81m samples given    Signed, TShelva Majestic MD  08/25/2019 9:36 AM    [id number] [place]  [id number]: [phone]

## 2019-08-25 ENCOUNTER — Encounter: Payer: Self-pay | Admitting: Cardiovascular Disease

## 2019-08-27 ENCOUNTER — Other Ambulatory Visit: Payer: Self-pay | Admitting: *Deleted

## 2019-08-27 ENCOUNTER — Other Ambulatory Visit: Payer: Self-pay

## 2019-08-27 DIAGNOSIS — Z79899 Other long term (current) drug therapy: Secondary | ICD-10-CM

## 2019-08-27 LAB — BASIC METABOLIC PANEL
BUN/Creatinine Ratio: 29 — ABNORMAL HIGH (ref 12–28)
BUN: 56 mg/dL — ABNORMAL HIGH (ref 8–27)
CO2: 17 mmol/L — ABNORMAL LOW (ref 20–29)
Calcium: 8.6 mg/dL — ABNORMAL LOW (ref 8.7–10.3)
Chloride: 106 mmol/L (ref 96–106)
Creatinine, Ser: 1.93 mg/dL — ABNORMAL HIGH (ref 0.57–1.00)
GFR calc Af Amer: 28 mL/min/{1.73_m2} — ABNORMAL LOW (ref >59)
GFR calc non Af Amer: 24 mL/min/{1.73_m2} — ABNORMAL LOW (ref >59)
Glucose: 171 mg/dL — ABNORMAL HIGH (ref 65–99)
Potassium: 5.4 mmol/L — ABNORMAL HIGH (ref 3.5–5.2)
Sodium: 134 mmol/L (ref 134–144)

## 2019-08-29 ENCOUNTER — Other Ambulatory Visit: Payer: Self-pay | Admitting: Cardiovascular Disease

## 2019-08-30 LAB — BASIC METABOLIC PANEL
BUN/Creatinine Ratio: 32 — ABNORMAL HIGH (ref 12–28)
BUN: 52 mg/dL — ABNORMAL HIGH (ref 8–27)
CO2: 17 mmol/L — ABNORMAL LOW (ref 20–29)
Calcium: 8.9 mg/dL (ref 8.7–10.3)
Chloride: 101 mmol/L (ref 96–106)
Creatinine, Ser: 1.61 mg/dL — ABNORMAL HIGH (ref 0.57–1.00)
GFR calc Af Amer: 34 mL/min/{1.73_m2} — ABNORMAL LOW (ref >59)
GFR calc non Af Amer: 30 mL/min/{1.73_m2} — ABNORMAL LOW (ref >59)
Glucose: 104 mg/dL — ABNORMAL HIGH (ref 65–99)
Potassium: 5.2 mmol/L (ref 3.5–5.2)
Sodium: 136 mmol/L (ref 134–144)

## 2019-09-12 ENCOUNTER — Telehealth: Payer: Self-pay | Admitting: Cardiovascular Disease

## 2019-09-12 ENCOUNTER — Other Ambulatory Visit: Payer: Self-pay

## 2019-09-12 ENCOUNTER — Ambulatory Visit (INDEPENDENT_AMBULATORY_CARE_PROVIDER_SITE_OTHER): Payer: No Typology Code available for payment source | Admitting: Internal Medicine

## 2019-09-12 VITALS — BP 114/58 | HR 61 | Ht 60.0 in | Wt 125.0 lb

## 2019-09-12 DIAGNOSIS — I255 Ischemic cardiomyopathy: Secondary | ICD-10-CM

## 2019-09-12 NOTE — Telephone Encounter (Signed)
Patient does not have a DPR allowing staff to speak to daughter. Interpreter contacted and spoke with patient. Lab results reviewed, patient verbalized understanding.

## 2019-09-12 NOTE — Telephone Encounter (Signed)
Patient's daughter states she is requesting to discuss the patient's lab results from labs completed on 08/29/19. Please return call to discuss.

## 2019-09-12 NOTE — Progress Notes (Signed)
HPI [name] is referred today by Dr. Tresa Endo for consideration for ICD insertion. She is a pleasant 79 yo woman with a h/o CAD, s/p MI presenting late several months ago. S he has been on maximal guideline directed medical therapy. Her repeat echo revealed an EF of 25%. She has not had syncope. She has a narrow qrs. Her CHF improved from class 3 to class 2 with Entresto.   No Known Allergies   Current Outpatient Medications  Medication Sig Dispense Refill  . aspirin 81 MG chewable tablet Chew 1 tablet (81 mg total) by mouth daily. 60 tablet 2  . atorvastatin (LIPITOR) 80 MG tablet Take 1 tablet (80 mg total) by mouth daily. 30 tablet 7  . carvedilol (COREG) 6.25 MG tablet Take 6.25 mg by mouth 2 (two) times daily with a meal.    . famotidine (PEPCID) 20 MG tablet TAKE 2 TABLETS BY MOUTH AT BEDTIME 120 tablet 0  . furosemide (LASIX) 20 MG tablet Take 1 tablet (20 mg total) by mouth daily as needed for fluid. 30 tablet 1  . glimepiride (AMARYL) 2 MG tablet Take 1 tablet by mouth once daily with breakfast 30 tablet 11  . isosorbide mononitrate (IMDUR) 30 MG 24 hr tablet Take 1 tablet by mouth once daily 90 tablet 0  . loratadine (CLARITIN) 10 MG tablet Take 1 tablet (10 mg total) by mouth daily. 60 tablet 2  . nitroGLYCERIN (NITROSTAT) 0.4 MG SL tablet Place 1 tablet (0.4 mg total) under the tongue every 5 (five) minutes x 3 doses as needed for chest pain. 25 tablet 2  . sacubitril-valsartan (ENTRESTO) 24-26 MG Take 1 tablet by mouth 2 (two) times daily. 60 tablet 11  . sertraline (ZOLOFT) 50 MG tablet Take 0.5 tablets (25 mg total) by mouth at bedtime. 15 tablet 11  . ticagrelor (BRILINTA) 60 MG TABS tablet Take 1 tablet (60 mg total) by mouth 2 (two) times daily. 60 tablet 6  . triamcinolone cream (KENALOG) 0.1 % Apply 1 application topically 2 (two) times daily. 30 g 1   No current facility-administered medications for this visit.     Past Medical History:  Diagnosis Date  .  Acute combined systolic and diastolic CHF, NYHA class 3 (HCC) 10/10/2017   in setting of MI  . Acute cystitis without hematuria   . Acute on chronic systolic congestive heart failure (HCC) 10/20/2017  . Anxiety and depression 12/19/2017  . Chest pain 10/31/2017  . Chest pain, rule out acute myocardial infarction 11/19/2017  . Coronary artery disease 10/31/2017  . Dyspnea 10/05/2017  . Heart failure (HCC) 12/23/2017  . HOH (hard of hearing)   . Ischemic cardiomyopathy 10/05/2017  . MI, acute, non ST segment elevation (HCC)   . Non-insulin dependent type 2 diabetes mellitus (HCC) 2004  . 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 STEMI involving left anterior descending coronary artery (HCC) 10/05/2017    ROS:   All systems reviewed and negative except as noted in the HPI.   Past Surgical History:  Procedure Laterality Date  . CORONARY/GRAFT ACUTE MI REVASCULARIZATION N/A 10/05/2017   Procedure: Coronary/Graft Acute MI Revascularization;  Surgeon: Lennette Bihari, MD;  Location: [facility] INVASIVE CV LAB;  Service: Cardiovascular;  Laterality: N/A;  . LEFT HEART CATH AND CORONARY ANGIOGRAPHY N/A 10/05/2017   Procedure: LEFT HEART CATH AND CORONARY ANGIOGRAPHY;  Surgeon: Lennette Bihari, MD;  Location: [facility] INVASIVE CV LAB;  Service: Cardiovascular;  Laterality: N/A;  Family History  Problem Relation Age of Onset  . Alcohol abuse Father      Social History   Socioeconomic History  . Marital status: Widowed    Spouse name: Not on file  . Number of children: 8  . Years of education: 81  . Highest education level: Not on file  Occupational History  . Occupation: Cabin crew, retired  Tobacco Use  . Smoking status: Never Smoker  . Smokeless tobacco: Never Used  Substance and Sexual Activity  . Alcohol use: Never  . Drug use: Never  . Sexual activity: Not Currently  Other Topics Concern  . Not on file  Social History Narrative   Living with daughter, Marisue [place] and her husband and  her 3 sons.   Social Determinants of Health   Financial Resource Strain:   . Difficulty of Paying Living Expenses:   Food Insecurity:   . Worried About Charity fundraiser in the Last Year:   . Arboriculturist in the Last Year:   Transportation Needs:   . Film/video editor (Medical):   Marland Kitchen Lack of Transportation (Non-Medical):   Physical Activity:   . Days of Exercise per Week:   . Minutes of Exercise per Session:   Stress:   . Feeling of Stress :   Social Connections:   . Frequency of Communication with Friends and Family:   . Frequency of Social Gatherings with Friends and Family:   . Attends Religious Services:   . Active Member of Clubs or Organizations:   . Attends Archivist Meetings:   Marland Kitchen Marital Status:   Intimate Partner Violence:   . Fear of Current or Ex-Partner:   . Emotionally Abused:   Marland Kitchen Physically Abused:   . Sexually Abused:      BP (!) 114/58   Pulse 61   Ht 5' (1.524 m)   Wt 125 lb (56.7 kg)   SpO2 99%   BMI 24.41 kg/m   Physical Exam:  Well appearing NAD HEENT: Unremarkable Neck:  No JVD, no thyromegally Lymphatics:  No adenopathy Back:  No CVA tenderness Lungs:  Clear with no wheezes HEART:  Regular rate rhythm, no murmurs, no rubs, no clicks Abd:  soft, positive bowel sounds, no organomegally, no rebound, no guarding Ext:  2 plus pulses, no edema, no cyanosis, no clubbing Skin:  No rashes no nodules Neuro:  CN II through XII intact, motor grossly intact  Assess/Plan: 1. ICM - she is s/p MI. She does not have angina. I have discussed the treatment options with the patient and her daughter. She will call us if she would like to proceed with ICD insertion.  2. Chronic systolic heart failure - her symptoms are class 2 and she is on guideline directed medical therapy. We will follow.  Mikle Bosworth.D.

## 2019-09-12 NOTE — Patient Instructions (Signed)
Medication Instructions:  Your physician recommends that you continue on your current medications as directed. Please refer to the Current Medication list given to you today.  Labwork: None ordered.  Testing/Procedures: Your physician has recommended that you have a defibrillator inserted. An implantable cardioverter defibrillator (ICD) is a small device that is placed in your chest or, in rare cases, your abdomen. This device uses electrical pulses or shocks to help control life-threatening, irregular heartbeats that could lead the heart to suddenly stop beating (sudden cardiac arrest). Leads are attached to the ICD that goes into your heart. This is done in the hospital and usually requires an overnight stay. Please see the instruction sheet given to you today for more information.  Follow-Up:  The following dates are available for procedures:  March 25, 30 April 1, 5, 8, 12, 15, 19, 22, 26 and 30  Any Other Special Instructions Will Be Listed Below (If Applicable).  If you need a refill on your cardiac medications before your next appointment, please call your pharmacy.    Cardioverter Defibrillator Implantation  An implantable cardioverter defibrillator (ICD) is a small device that is placed under the skin in the chest or abdomen. An ICD consists of a battery, a small computer (pulse generator), and wires (leads) that go into the heart. An ICD is used to detect and correct two types of dangerous irregular heartbeats (arrhythmias):  A rapid heart rhythm (tachycardia).  An arrhythmia in which the lower chambers of the heart (ventricles) contract in an uncoordinated way (fibrillation). When an ICD detects tachycardia, it sends a low-energy shock to the heart to restore the heartbeat to normal (cardioversion). This signal is usually painless. If cardioversion does not work or if the ICD detects fibrillation, it delivers a high-energy shock to the heart (defibrillation) to restart the  heart. This shock may feel like a strong jolt in the chest. Your health care provider may prescribe an ICD if:  You have had an arrhythmia that originated in the ventricles.  Your heart has been damaged by a disease or heart condition. Sometimes, ICDs are programmed to act as a device called a pacemaker. Pacemakers can be used to treat a slow heartbeat (bradycardia) or tachycardia by taking over the heart rate with electrical impulses. Tell a health care provider about:  Any allergies you have.  All medicines you are taking, including vitamins, herbs, eye drops, creams, and over-the-counter medicines.  Any problems you or family members have had with anesthetic medicines.  Any blood disorders you have.  Any surgeries you have had.  Any medical conditions you have.  Whether you are pregnant or may be pregnant. What are the risks? Generally, this is a safe procedure. However, problems may occur, including:  Swelling, bleeding, or bruising.  Infection.  Blood clots.  Damage to other structures or organs, such as nerves, blood vessels, or the heart.  Allergic reactions to medicines used during the procedure. What happens before the procedure? Staying hydrated Follow instructions from your health care provider about hydration, which may include:  Up to 2 hours before the procedure - you may continue to drink clear liquids, such as water, clear fruit juice, black coffee, and plain tea. Eating and drinking restrictions Follow instructions from your health care provider about eating and drinking, which may include:  8 hours before the procedure - stop eating heavy meals or foods such as meat, fried foods, or fatty foods.  6 hours before the procedure - stop eating light meals or foods,  such as toast or cereal.  6 hours before the procedure - stop drinking milk or drinks that contain milk.  2 hours before the procedure - stop drinking clear liquids. Medicine Ask your health  care provider about:  Changing or stopping your normal medicines. This is important if you take diabetes medicines or blood thinners.  Taking medicines such as aspirin and ibuprofen. These medicines can thin your blood. Do not take these medicines before your procedure if your doctor tells you not to. Tests  You may have blood tests.  You may have a test to check the electrical signals in your heart (electrocardiogram, ECG).  You may have imaging tests, such as a chest X-ray. General instructions  For 24 hours before the procedure, stop using products that contain nicotine or tobacco, such as cigarettes and e-cigarettes. If you need help quitting, ask your health care provider.  Plan to have someone take you home from the hospital or clinic.  You may be asked to shower with a germ-killing soap. What happens during the procedure?  To reduce your risk of infection: ? Your health care team will wash or sanitize their hands. ? Your skin will be washed with soap. ? Hair may be removed from the surgical area.  Small monitors will be put on your body. They will be used to check your heart, blood pressure, and oxygen level.  An IV tube will be inserted into one of your veins.  You will be given one or more of the following: ? A medicine to help you relax (sedative). ? A medicine to numb the area (local anesthetic). ? A medicine to make you fall asleep (general anesthetic).  Leads will be guided through a blood vessel into your heart and attached to your heart muscles. Depending on the ICD, the leads may go into one ventricle or they may go into both ventricles and into an upper chamber of the heart. An X-ray machine (fluoroscope) will be usedto help guide the leads.  A small incision will be made to create a deep pocket under your skin.  The pulse generator will be placed into the pocket.  The ICD will be tested.  The incision will be closed with stitches (sutures), skin glue, or  staples.  A bandage (dressing) will be placed over the incision. This procedure may vary among health care providers and hospitals. What happens after the procedure?  Your blood pressure, heart rate, breathing rate, and blood oxygen level will be monitored often until the medicines you were given have worn off.  A chest X-ray will be taken to check that the ICD is in the right place.  You will need to stay in the hospital for 1-2 days so your health care provider can make sure your ICD is working.  Do not drive for 24 hours if you received a sedative. Ask your health care provider when it is safe for you to drive.  You may be given an identification card explaining that you have an ICD. Summary  An implantable cardioverter defibrillator (ICD) is a small device that is placed under the skin in the chest or abdomen. It is used to detect and correct dangerous irregular heartbeats (arrhythmias).  An ICD consists of a battery, a small computer (pulse generator), and wires (leads) that go into the heart.  When an ICD detects rapid heart rhythm (tachycardia), it sends a low-energy shock to the heart to restore the heartbeat to normal (cardioversion). If cardioversion does not work  or if the ICD detects uncoordinated heart contractions (fibrillation), it delivers a high-energy shock to the heart (defibrillation) to restart the heart.  You will need to stay in the hospital for 1-2 days to make sure your ICD is working. This information is not intended to replace advice given to you by your health care provider. Make sure you discuss any questions you have with your health care provider. Document Revised: 05/27/2017 Document Reviewed: 06/23/2016 Elsevier Patient Education  2020 [employer].

## 2019-09-14 ENCOUNTER — Other Ambulatory Visit: Payer: Self-pay | Admitting: Internal Medicine

## 2019-09-19 ENCOUNTER — Other Ambulatory Visit: Payer: Self-pay

## 2019-12-20 ENCOUNTER — Ambulatory Visit: Payer: Self-pay | Admitting: Cardiovascular Disease

## 2020-01-11 ENCOUNTER — Ambulatory Visit: Payer: Self-pay | Admitting: Cardiology

## 2020-01-14 ENCOUNTER — Other Ambulatory Visit: Payer: Self-pay | Admitting: Internal Medicine

## 2020-01-25 ENCOUNTER — Ambulatory Visit (INDEPENDENT_AMBULATORY_CARE_PROVIDER_SITE_OTHER): Payer: No Typology Code available for payment source | Admitting: Cardiology

## 2020-01-25 ENCOUNTER — Other Ambulatory Visit: Payer: Self-pay

## 2020-01-25 VITALS — BP 110/62 | HR 74 | Ht 60.0 in | Wt 127.2 lb

## 2020-01-25 DIAGNOSIS — N1832 Chronic kidney disease, stage 3b: Secondary | ICD-10-CM

## 2020-01-25 DIAGNOSIS — I255 Ischemic cardiomyopathy: Secondary | ICD-10-CM

## 2020-01-25 DIAGNOSIS — I252 Old myocardial infarction: Secondary | ICD-10-CM

## 2020-01-25 DIAGNOSIS — I10 Essential (primary) hypertension: Secondary | ICD-10-CM

## 2020-01-25 DIAGNOSIS — E119 Type 2 diabetes mellitus without complications: Secondary | ICD-10-CM

## 2020-01-25 DIAGNOSIS — Z9861 Coronary angioplasty status: Secondary | ICD-10-CM

## 2020-01-25 DIAGNOSIS — E785 Hyperlipidemia, unspecified: Secondary | ICD-10-CM

## 2020-01-25 DIAGNOSIS — I2510 Atherosclerotic heart disease of native coronary artery without angina pectoris: Secondary | ICD-10-CM

## 2020-01-25 NOTE — Patient Instructions (Signed)
Medication Instructions:  Your physician recommends that you continue on your current medications as directed. Please refer to the Current Medication list given to you today.  *If you need a refill on your cardiac medications before your next appointment, please call your pharmacy*   Follow-Up: At [facility], you and your health needs are our priority.  As part of our continuing mission to provide you with exceptional heart care, we have created designated Provider Care Teams.  These Care Teams include your primary Cardiologist (physician) and Advanced Practice Providers (APPs -  Physician Assistants and Nurse Practitioners) who all work together to provide you with the care you need, when you need it.  We recommend signing up for the patient portal called "MyChart".  Sign up information is provided on this After Visit Summary.  MyChart is used to connect with patients for Virtual Visits (Telemedicine).  Patients are able to view lab/test results, encounter notes, upcoming appointments, etc.  Non-urgent messages can be sent to your provider as well.   To learn more about what you can do with MyChart, go to [email or web address].    Your next appointment:   6 month(s)  The format for your next appointment:   In Person  Provider:   You may see Thomas Kelly, MD or one of the following Advanced Practice Providers on your designated Care Team:    Hao Meng, PA-C  Angela Duke, PA-C or   Krista Kroeger, PA-C    Other Instructions Please call our office 2 months in advance to schedule your follow-up appointment with Dr. Kelly.  

## 2020-01-25 NOTE — Assessment & Plan Note (Signed)
EF 25%.  Class 2 CHF symptoms on Entresto.  After review it was decided not to proceed with ICD

## 2020-01-25 NOTE — Assessment & Plan Note (Signed)
AS STEMI April 2019

## 2020-01-25 NOTE — Assessment & Plan Note (Signed)
Controlled.  

## 2020-01-25 NOTE — Progress Notes (Signed)
Cardiology Office Note:    Date:  01/25/2020   ID:  [name] [name], [place] 1940-12-24, MRN [id number]  PCP:  Julieanne Manson, MD  Cardiologist:  Nicki Guadalajara, MD  Electrophysiologist:  None   Referring MD: Julieanne Manson, MD   No chief complaint on file.   History of Present Illness:    Sheila White White is a 79 y.o. female from [place] with a hx of an anterior MI April 2019.  She underwent successful PCI to the proximal LAD with a DES and PCI to the mid LAD with DES.  She had an apical stenosis treated with p.o. BA.  She had a complicated course with postinfarction pericarditis acute kidney injury and abdominal pain.  She is done well since.  She has been placed on Entresto for ischemic cardiomyopathy with an EF of 25%.  Unfortunately this did not improve with medical therapy.  She was evaluated by Dr. Ladona Ridgel for consideration of an ICD.  The patient's symptoms did improve with Entresto to class II.  After discussion with the patient and family with Dr. Ladona Ridgel it was decided not to proceed with an ICD.  Patient is seen in the office today accompanied by an interpreter and family member for 48-month follow-up.  She is done well from a cardiac standpoint.  She denies any orthopnea or unusual dyspnea.  She is not having chest pain or had to use nitroglycerin.  She does have an occasional headache but it is only once or twice a week, I suspect this is from Imdur.  Past Medical History:  Diagnosis Date  . Acute combined systolic and diastolic CHF, NYHA class 3 (HCC) 10/10/2017   in setting of MI  . Acute cystitis without hematuria   . Acute on chronic systolic congestive heart failure (HCC) 10/20/2017  . Anxiety and depression 12/19/2017  . Chest pain 10/31/2017  . Chest pain, rule out acute myocardial infarction 11/19/2017  . Coronary artery disease 10/31/2017  . Dyspnea 10/05/2017  . Heart failure (HCC) 12/23/2017  . HOH (hard of hearing)   . Ischemic  cardiomyopathy 10/05/2017  . MI, acute, non ST segment elevation (HCC)   . Non-insulin dependent type 2 diabetes mellitus (HCC) 2004  . 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 STEMI involving left anterior descending coronary artery (HCC) 10/05/2017    Past Surgical History:  Procedure Laterality Date  . CORONARY/GRAFT ACUTE MI REVASCULARIZATION N/A 10/05/2017   Procedure: Coronary/Graft Acute MI Revascularization;  Surgeon: Lennette Bihari, MD;  Location: [facility] INVASIVE CV LAB;  Service: Cardiovascular;  Laterality: N/A;  . LEFT HEART CATH AND CORONARY ANGIOGRAPHY N/A 10/05/2017   Procedure: LEFT HEART CATH AND CORONARY ANGIOGRAPHY;  Surgeon: Lennette Bihari, MD;  Location: [facility] INVASIVE CV LAB;  Service: Cardiovascular;  Laterality: N/A;    Current Medications: Current Meds  Medication Sig  . aspirin 81 MG chewable tablet Chew 1 tablet (81 mg total) by mouth daily.  Marland Kitchen atorvastatin (LIPITOR) 80 MG tablet Take 1 tablet (80 mg total) by mouth daily.  . carvedilol (COREG) 6.25 MG tablet TAKE 1 TABLET BY MOUTH TWICE DAILY WITH A MEAL  . famotidine (PEPCID) 20 MG tablet TAKE 2 TABLETS BY MOUTH AT BEDTIME  . furosemide (LASIX) 20 MG tablet Take 1 tablet (20 mg total) by mouth daily as needed for fluid.  Marland Kitchen glimepiride (AMARYL) 2 MG tablet Take 1 tablet by mouth once daily with breakfast  . isosorbide mononitrate (IMDUR) 30 MG 24 hr tablet  Take 1/2 (one-half) tablet by mouth twice daily  . loratadine (CLARITIN) 10 MG tablet Take 1 tablet (10 mg total) by mouth daily.  . nitroGLYCERIN (NITROSTAT) 0.4 MG SL tablet Place 1 tablet (0.4 mg total) under the tongue every 5 (five) minutes x 3 doses as needed for chest pain.  . sacubitril-valsartan (ENTRESTO) 24-26 MG Take 1 tablet by mouth 2 (two) times daily.  . sertraline (ZOLOFT) 50 MG tablet TAKE 1/2 (ONE-HALF) TABLET BY MOUTH ONCE DAILY AT BEDTIME  . ticagrelor (BRILINTA) 60 MG TABS tablet Take 1 tablet (60 mg total) by mouth 2 (two) times  daily.  Marland Kitchen triamcinolone cream (KENALOG) 0.1 % Apply 1 application topically 2 (two) times daily.     Allergies:   Patient has no known allergies.   Social History   Socioeconomic History  . Marital status: Widowed    Spouse name: Not on file  . Number of children: 8  . Years of education: 7  . Highest education level: Not on file  Occupational History  . Occupation: Orthoptist, retired  Tobacco Use  . Smoking status: Never Smoker  . Smokeless tobacco: Never Used  Vaping Use  . Vaping Use: Never used  Substance and Sexual Activity  . Alcohol use: Never  . Drug use: Never  . Sexual activity: Not Currently  Other Topics Concern  . Not on file  Social History Narrative   Living with daughter, Wray Kearns and her husband and her 3 sons.   Social Determinants of Health   Financial Resource Strain:   . Difficulty of Paying Living Expenses:   Food Insecurity:   . Worried About Programme researcher, broadcasting/film/video in the Last Year:   . Barista in the Last Year:   Transportation Needs:   . Freight forwarder (Medical):   Marland Kitchen Lack of Transportation (Non-Medical):   Physical Activity:   . Days of Exercise per Week:   . Minutes of Exercise per Session:   Stress:   . Feeling of Stress :   Social Connections:   . Frequency of Communication with Friends and Family:   . Frequency of Social Gatherings with Friends and Family:   . Attends Religious Services:   . Active Member of Clubs or Organizations:   . Attends Banker Meetings:   Marland Kitchen Marital Status:      Family History: The patient's family history includes Alcohol abuse in her father.  ROS:   Please see the history of present illness.     All other systems reviewed and are negative.  EKGs/Labs/Other Studies Reviewed:    The following studies were reviewed today: Echo 07/25/2019- IMPRESSIONS    1. Left ventricular ejection fraction, by visual estimation, is 25 to  30%. The left ventricle has severely decreased  function. There is mildly  increased left ventricular hypertrophy.  2. Definity contrast agent was given IV to delineate the left ventricular  endocardial borders.  3. Left ventricular diastolic parameters are consistent with Grade III  diastolic dysfunction (restrictive).  4. Severely dilated left ventricular internal cavity size.  5. The left ventricle demonstrates regional wall motion abnormalities.  6. Only basal function preserved mid and apical inferior, anterior,  septum and apex severely hypokinetic.  7. Global right ventricle has normal systolic function.The right  ventricular size is normal. No increase in right ventricular wall  thickness.  8. Left atrial size was moderately dilated.  9. Right atrial size was normal.  10. Moderate calcification of the  mitral valve leaflet(s).  11. Moderate mitral annular calcification.  12. Moderate thickening of the mitral valve leaflet(s).  13. The mitral valve is normal in structure. Mild mitral valve  regurgitation.  14. The tricuspid valve is normal in structure.  15. The tricuspid valve is normal in structure. Tricuspid valve  regurgitation is mild.  16. The aortic valve is tricuspid. Aortic valve regurgitation is mild.  severe sclerosis especially the non coronary cusp.  17. Pulmonic regurgitation is mild.  18. The pulmonic valve was grossly normal. Pulmonic valve regurgitation is  mild.   EKG:  EKG is not ordered today.  The ekg ordered 08/24/2019 demonstrates NSR, inferior and AS Qs  Recent Labs: 08/20/2019: ALT 25; Hemoglobin 11.4; Platelets 237 08/29/2019: BUN 52; Creatinine, Ser 1.61; Potassium 5.2; Sodium 136  Recent Lipid Panel    Component Value Date/Time   CHOL 141 08/20/2019 0856   TRIG 85 08/20/2019 0856   HDL 59 08/20/2019 0856   CHOLHDL 3.3 10/06/2017 0256   VLDL 22 10/06/2017 0256   LDLCALC 66 08/20/2019 0856    Physical Exam:    VS:  BP (!) 110/62   Pulse 74   Ht 5' (1.524 m)   Wt 127 lb 3.2 oz  (57.7 kg)   SpO2 97%   BMI 24.84 kg/m     Wt Readings from Last 3 Encounters:  01/25/20 127 lb 3.2 oz (57.7 kg)  09/12/19 125 lb (56.7 kg)  08/24/19 122 lb (55.3 kg)     GEN:  Well nourished, well developed in no acute distress HEENT: Normal NECK: No JVD; No carotid bruits CARDIAC: RRR, no murmurs, rubs, gallops RESPIRATORY:  Clear to auscultation without rales, wheezing or rhonchi  ABDOMEN: Soft, non-tender, non-distended MUSCULOSKELETAL:  No edema; No deformity  SKIN: Warm and dry NEUROLOGIC:  Alert and oriented x 3 PSYCHIATRIC:  Normal affect   ASSESSMENT:    History of ST elevation myocardial infarction (STEMI) AS STEMI April 2019  CAD S/P percutaneous coronary angioplasty pLAD and mLAD PCI with DES, apical POBA April 2019  Ischemic cardiomyopathy EF 25%.  Class 2 CHF symptoms on Entresto.  After review it was decided not to proceed with ICD   Essential hypertension Controlled  CKD (chronic kidney disease) stage 3, GFR 30-59 ml/min (HCC) GFR 30- PCP follows, she is going to see PCP next week  Non-insulin dependent type 2 diabetes mellitus (HCC) On Amaryl  Dyslipidemia, goal LDL below 70 LDL 66 on statin Rx Feb 2021  PLAN:    No change in Rx- pt doing well.  F/U Dr Tresa EndoKelly in 6 months.   Medication Adjustments/Labs and Tests Ordered: Current medicines are reviewed at length with the patient today.  Concerns regarding medicines are outlined above.  No orders of the defined types were placed in this encounter.  No orders of the defined types were placed in this encounter.   Patient Instructions  Medication Instructions:  Your physician recommends that you continue on your current medications as directed. Please refer to the Current Medication list given to you today.  *If you need a refill on your cardiac medications before your next appointment, please call your pharmacy*   Follow-Up: At [facility], you and your health needs are our priority.  As  part of our continuing mission to provide you with exceptional heart care, we have created designated Provider Care Teams.  These Care Teams include your primary Cardiologist (physician) and Advanced Practice Providers (APPs -  Physician Assistants and Nurse Practitioners) who  all work together to provide you with the care you need, when you need it.  We recommend signing up for the patient portal called "MyChart".  Sign up information is provided on this After Visit Summary.  MyChart is used to connect with patients for Virtual Visits (Telemedicine).  Patients are able to view lab/test results, encounter notes, upcoming appointments, etc.  Non-urgent messages can be sent to your provider as well.   To learn more about what you can do with MyChart, go to [email or web address].    Your next appointment:   6 month(s)  The format for your next appointment:   In Person  Provider:   You may see Nicki Guadalajara, MD or one of the following Advanced Practice Providers on your designated Care Team:    Azalee Course, PA-C  Micah Flesher, [place] or   Judy Pimple, [place]    Other Instructions Please call our office 2 months in advance to schedule your follow-up appointment with Dr. Tresa Endo.     Signed, Corine Shelter, PA-C  01/25/2020 11:59 AM    [facility]

## 2020-01-25 NOTE — Assessment & Plan Note (Signed)
pLAD and mLAD PCI with DES, apical POBA April 2019

## 2020-01-25 NOTE — Assessment & Plan Note (Signed)
GFR 30- PCP follows, she is going to see PCP next week

## 2020-01-25 NOTE — Assessment & Plan Note (Signed)
LDL 66 on statin Rx Feb 2021

## 2020-01-25 NOTE — Assessment & Plan Note (Signed)
On Amaryl 

## 2020-02-06 ENCOUNTER — Ambulatory Visit: Payer: Self-pay | Admitting: Internal Medicine

## 2020-02-13 ENCOUNTER — Encounter: Payer: Self-pay | Admitting: Internal Medicine

## 2020-02-13 ENCOUNTER — Ambulatory Visit (INDEPENDENT_AMBULATORY_CARE_PROVIDER_SITE_OTHER): Payer: Self-pay | Admitting: Internal Medicine

## 2020-02-13 VITALS — BP 118/60 | HR 66 | Resp 12 | Ht 59.0 in | Wt 126.0 lb

## 2020-02-13 DIAGNOSIS — E119 Type 2 diabetes mellitus without complications: Secondary | ICD-10-CM

## 2020-02-13 DIAGNOSIS — J302 Other seasonal allergic rhinitis: Secondary | ICD-10-CM

## 2020-02-13 DIAGNOSIS — I255 Ischemic cardiomyopathy: Secondary | ICD-10-CM

## 2020-02-13 DIAGNOSIS — N1832 Chronic kidney disease, stage 3b: Secondary | ICD-10-CM

## 2020-02-13 DIAGNOSIS — F419 Anxiety disorder, unspecified: Secondary | ICD-10-CM

## 2020-02-13 DIAGNOSIS — Z79899 Other long term (current) drug therapy: Secondary | ICD-10-CM

## 2020-02-13 DIAGNOSIS — Z23 Encounter for immunization: Secondary | ICD-10-CM

## 2020-02-13 DIAGNOSIS — F329 Major depressive disorder, single episode, unspecified: Secondary | ICD-10-CM

## 2020-02-13 DIAGNOSIS — I10 Essential (primary) hypertension: Secondary | ICD-10-CM

## 2020-02-13 DIAGNOSIS — E785 Hyperlipidemia, unspecified: Secondary | ICD-10-CM

## 2020-02-13 MED ORDER — OLOPATADINE HCL 0.2 % OP SOLN: OPHTHALMIC | 11 refills | Status: DC

## 2020-02-13 NOTE — Progress Notes (Signed)
Subjective:    Patient ID: Sheila White, female   DOB: 1940/10/01, 79 y.o.   MRN: [id number]   HPI   Daughter interprets  1.  Blurred vision:  Eyes are watery and blurs vision at times.  Sometimes with itching of eyes.  Daughter will give her Loratadine and seems to help.  2.  CV:  No chest pain, No dyspnea, No PND or orthopnea.  Occasional swelling of ankles, but just a bit.  Using Furosemide 10 mg once daily only as needed.   Never heard back from drug rep from Brilinta--paying $374 for 30 day supply of Brilinta.  Not on formulary at [facility] or [facility].   Gets Entresto from cardiology office.   Cholesterol was at goal in February.    3.  CKD:  Creatinine 1.61 at last check 3/32021.  Potassium 5.2 on Entresto.    4.  Depression/anxiety:  No concerns.  Continues on 50 mg Sertraline.  5.  DM:  Last A1C was 7.5% in February.   Current Meds  Medication Sig  . aspirin 81 MG chewable tablet Chew 1 tablet (81 mg total) by mouth daily.  Marland Kitchen atorvastatin (LIPITOR) 80 MG tablet Take 1 tablet (80 mg total) by mouth daily.  . carvedilol (COREG) 6.25 MG tablet TAKE 1 TABLET BY MOUTH TWICE DAILY WITH A MEAL  . famotidine (PEPCID) 20 MG tablet TAKE 2 TABLETS BY MOUTH AT BEDTIME  . furosemide (LASIX) 20 MG tablet Take 1 tablet (20 mg total) by mouth daily as needed for fluid.  Marland Kitchen glimepiride (AMARYL) 2 MG tablet Take 1 tablet by mouth once daily with breakfast  . isosorbide mononitrate (IMDUR) 30 MG 24 hr tablet Take 1/2 (one-half) tablet by mouth twice daily  . loratadine (CLARITIN) 10 MG tablet Take 1 tablet (10 mg total) by mouth daily.  . nitroGLYCERIN (NITROSTAT) 0.4 MG SL tablet Place 1 tablet (0.4 mg total) under the tongue every 5 (five) minutes x 3 doses as needed for chest pain.  . sacubitril-valsartan (ENTRESTO) 24-26 MG Take 1 tablet by mouth 2 (two) times daily.  . sertraline (ZOLOFT) 50 MG tablet TAKE 1/2 (ONE-HALF) TABLET BY MOUTH ONCE DAILY AT BEDTIME  .  ticagrelor (BRILINTA) 60 MG TABS tablet Take 1 tablet (60 mg total) by mouth 2 (two) times daily.  Marland Kitchen triamcinolone cream (KENALOG) 0.1 % Apply 1 application topically 2 (two) times daily.   No Known Allergies   Review of Systems    Objective:   BP 118/60 (BP Location: Left Arm, Patient Position: Sitting, Cuff Size: Normal)   Pulse 66   Resp 12   Ht 4\' 11"  (1.499 m)   Wt 126 lb (57.2 kg)   SpO2 98%   BMI 25.45 kg/m   Physical Exam  NAD HEENT:  PERRL, EOMI, Conjunctivae without injection.  No current watering of eyes.  TMs pearly gray, nasal mucosa with mild swelling and clear discharge.  Throat without injection. Neck:  Supple, No adenopathy Lungs:  Faint dry crackles at right posterior base.   CV:  RRR withour murmur or rub.  Carotid and Radial pulse normal and equal. Abd:  S, NT, No HSM or mass, + BS LE:  No edema.  Diabetic foot exam was performed with the following findings:   Decreased DP and PT pulses bilaterally.  Toenails thickened with mild discoloration and peripheral flaking.  Skin is shiny and decreased cap refill particularly with legs dependent.   10 g monofilament testing  normal save for decreased sensation on medial dorsal right foot.       Assessment & Plan   1.  Eye complaints--likely due to seasonal allergies:  Olapatadine eye drops 1 drop twice daily to both eyes. If needed, may also take Claritin.  2.   DM:  A1C.  Has not been as well controlled this year.  To work on diet and make sure she is not missing meds. Referral to [facility] for diabetic eye exam.  3.  Ischemic Cardiomyopathy/hypertension/CKD:  Compensated/controlled.  Has had some elevations of potassium on Entresto.  CMP, CBC  4.  Dyslipidemia:  Cholesterol at goal earlier in year.  5.  Anxiety and depression:  Controlled on low dose Sertraline  6.  HM:  Pneumococcal 23 v.  Influenza on the 23rd or in September

## 2020-02-14 LAB — COMPREHENSIVE METABOLIC PANEL
ALT: 15 IU/L (ref 0–32)
AST: 18 IU/L (ref 0–40)
Albumin/Globulin Ratio: 1.4 (ref 1.2–2.2)
Albumin: 3.9 g/dL (ref 3.6–4.6)
Alkaline Phosphatase: 313 IU/L — ABNORMAL HIGH (ref 48–121)
BUN/Creatinine Ratio: 33 — ABNORMAL HIGH (ref 12–28)
BUN: 48 mg/dL — ABNORMAL HIGH (ref 8–27)
Bilirubin Total: 0.2 mg/dL (ref 0.0–1.2)
CO2: 19 mmol/L — ABNORMAL LOW (ref 20–29)
Calcium: 8.9 mg/dL (ref 8.7–10.3)
Chloride: 106 mmol/L (ref 96–106)
Creatinine, Ser: 1.45 mg/dL — ABNORMAL HIGH (ref 0.57–1.00)
GFR calc Af Amer: 39 mL/min/{1.73_m2} — ABNORMAL LOW (ref >59)
GFR calc non Af Amer: 34 mL/min/{1.73_m2} — ABNORMAL LOW (ref >59)
Globulin, Total: 2.7 g/dL (ref 1.5–4.5)
Glucose: 122 mg/dL — ABNORMAL HIGH (ref 65–99)
Potassium: 5.7 mmol/L — ABNORMAL HIGH (ref 3.5–5.2)
Sodium: 137 mmol/L (ref 134–144)
Total Protein: 6.6 g/dL (ref 6.0–8.5)

## 2020-02-14 LAB — CBC WITH DIFFERENTIAL/PLATELET
Basophils Absolute: 0 10*3/uL (ref 0.0–0.2)
Basos: 1 %
EOS (ABSOLUTE): 0.2 10*3/uL (ref 0.0–0.4)
Eos: 3 %
Hematocrit: 33 % — ABNORMAL LOW (ref 34.0–46.6)
Hemoglobin: 10.5 g/dL — ABNORMAL LOW (ref 11.1–15.9)
Immature Grans (Abs): 0 10*3/uL (ref 0.0–0.1)
Immature Granulocytes: 0 %
Lymphocytes Absolute: 1.4 10*3/uL (ref 0.7–3.1)
Lymphs: 27 %
MCH: 29.2 pg (ref 26.6–33.0)
MCHC: 31.8 g/dL (ref 31.5–35.7)
MCV: 92 fL (ref 79–97)
Monocytes Absolute: 0.4 10*3/uL (ref 0.1–0.9)
Monocytes: 8 %
Neutrophils Absolute: 3.3 10*3/uL (ref 1.4–7.0)
Neutrophils: 61 %
Platelets: 195 10*3/uL (ref 150–450)
RBC: 3.6 x10E6/uL — ABNORMAL LOW (ref 3.77–5.28)
RDW: 13.2 % (ref 11.7–15.4)
WBC: 5.4 10*3/uL (ref 3.4–10.8)

## 2020-02-14 LAB — HGB A1C W/O EAG: Hgb A1c MFr Bld: 8.2 % — ABNORMAL HIGH (ref 4.8–5.6)

## 2020-02-15 ENCOUNTER — Other Ambulatory Visit: Payer: Self-pay | Admitting: Internal Medicine

## 2020-03-16 ENCOUNTER — Other Ambulatory Visit: Payer: Self-pay | Admitting: Cardiovascular Disease

## 2020-03-18 ENCOUNTER — Other Ambulatory Visit: Payer: Self-pay

## 2020-04-17 ENCOUNTER — Other Ambulatory Visit: Payer: Self-pay | Admitting: Internal Medicine

## 2020-06-25 ENCOUNTER — Other Ambulatory Visit: Payer: Self-pay | Admitting: Cardiovascular Disease

## 2020-07-02 ENCOUNTER — Telehealth: Payer: Self-pay | Admitting: Internal Medicine

## 2020-07-02 NOTE — Telephone Encounter (Signed)
Patient's daughter called asking for an appointment for the patient because she is not felling well. Patient start having cough, fatigue and cold since Thursday, December 30. No other symptoms and has not taking covid test. Please Advise.

## 2020-07-04 ENCOUNTER — Encounter: Payer: Self-pay | Admitting: Internal Medicine

## 2020-07-04 ENCOUNTER — Ambulatory Visit (INDEPENDENT_AMBULATORY_CARE_PROVIDER_SITE_OTHER): Payer: Self-pay | Admitting: Internal Medicine

## 2020-07-04 ENCOUNTER — Other Ambulatory Visit: Payer: Self-pay

## 2020-07-04 DIAGNOSIS — R059 Cough, unspecified: Secondary | ICD-10-CM

## 2020-07-04 DIAGNOSIS — U071 COVID-19: Secondary | ICD-10-CM

## 2020-07-04 LAB — POC COVID19 BINAXNOW: SARS Coronavirus 2 Ag: POSITIVE — AB

## 2020-07-04 NOTE — Progress Notes (Signed)
    Subjective:    Patient ID: Sheila White, female   DOB: Jun 26, 1941, 80 y.o.   MRN: [id number]   HPI   Patient seen outside as passenger in daughter's car.    Patient received Moderna primary vaccination here March 1st and March 29th.  Has not had booster.  She had a mild cough before Christmas, which is not necessarily unusual for her at times.   Her son had a runny nose and congestion at Christmas when they had a family gathering at her daughter's home where she has been staying.   Son tested positive for COVID 06/25/20. Patient began feeling ill the following day, 12.30.21 with runny nose, general malaise, fatigue, increased cough productive of white sputum.   She has not had a fever or dyspnea.   Patient apparently slightly better today with cough and fatigue. Her two daughters who accompany her now both have runny nose and mild cough.    Her daughter who is a patient here and generally accompanies patient to visits has very mild symptoms and does not live in the home where the Christmas gathering took place, has just developed symptoms in last couple of days.  Both daughters tested positive for COVID here today as well.  Current Meds  Medication Sig  . aspirin 81 MG chewable tablet Chew 1 tablet (81 mg total) by mouth daily.  Marland Kitchen atorvastatin (LIPITOR) 80 MG tablet Take 1 tablet by mouth once daily  . BRILINTA 60 MG TABS tablet Take 1 tablet by mouth twice daily  . carvedilol (COREG) 6.25 MG tablet TAKE 1 TABLET BY MOUTH TWICE DAILY WITH A MEAL  . famotidine (PEPCID) 20 MG tablet TAKE 2 TABLETS BY MOUTH AT BEDTIME  . furosemide (LASIX) 20 MG tablet Take 1 tablet (20 mg total) by mouth daily as needed for fluid.  Marland Kitchen glimepiride (AMARYL) 2 MG tablet Take 1 tablet by mouth once daily with breakfast  . isosorbide mononitrate (IMDUR) 30 MG 24 hr tablet Take 1/2 (one-half) tablet by mouth twice daily  . loratadine (CLARITIN) 10 MG tablet Take 1 tablet (10 mg total) by mouth  daily.  . nitroGLYCERIN (NITROSTAT) 0.4 MG SL tablet Place 1 tablet (0.4 mg total) under the tongue every 5 (five) minutes x 3 doses as needed for chest pain.  Marland Kitchen Olopatadine HCl (PATADAY) 0.2 % SOLN 1 drop each eye twice daily as needed for eye allergies  . sacubitril-valsartan (ENTRESTO) 24-26 MG Take 1 tablet by mouth 2 (two) times daily.  . sertraline (ZOLOFT) 50 MG tablet TAKE 1/2 (ONE-HALF) TABLET BY MOUTH ONCE DAILY AT BEDTIME  . triamcinolone cream (KENALOG) 0.1 % Apply 1 application topically 2 (two) times daily.   No Known Allergies   Review of Systems    Objective:   SpO2 98%   Physical Exam  Appears tired and mildly ill No tachypnea or accessory muscle use. Lungs:  Minor dry crackles at left base.  Difficult to hear with outside background noise at times. CV:  RRR without murmur or rub.  Radial pulses normal and equal.  Rapid COVID antigen test strongly positive.  Assessment & Plan   COVID 19:  Discussed supportive care for now with Tylenol and Mucinex, but will send in her name for possibility of outpatient monoclonal antibodies with her tenuous health/risk factors. To call clinic or go to ED if develops chest discomfort/worsening cough/dyspnea.

## 2020-07-08 ENCOUNTER — Telehealth: Payer: Self-pay | Admitting: Internal Medicine

## 2020-07-08 NOTE — Telephone Encounter (Signed)
Was seen last week. 

## 2020-07-08 NOTE — Telephone Encounter (Signed)
Patient's daughter called asking if Doctor has sent the prescription you mentioned on Friday after the patient tested positive for COVID, since patient continues having cough. Patient uses [facility], systems at [facility].

## 2020-07-08 NOTE — Telephone Encounter (Signed)
I recommended getting Mucinex to break up the mucous.   It is non prescription Is he any better? Did they hear from the monoclonal antibody clinic?

## 2020-07-11 NOTE — Telephone Encounter (Signed)
Spoke with patient's daughter, [name], and she stated that the patient is taking Mucinex and is doing better. Also, she ,mentioned that she never heard from the monoclonal antibody clinic.

## 2020-07-17 ENCOUNTER — Other Ambulatory Visit: Payer: Self-pay | Admitting: Internal Medicine

## 2020-07-17 ENCOUNTER — Other Ambulatory Visit: Payer: Self-pay | Admitting: Cardiovascular Disease

## 2020-08-21 ENCOUNTER — Ambulatory Visit: Payer: Self-pay | Admitting: Internal Medicine

## 2020-09-10 ENCOUNTER — Telehealth: Payer: Self-pay | Admitting: Cardiovascular Disease

## 2020-09-10 NOTE — Telephone Encounter (Signed)
Per daughter has filled out pt's portion and will drop application for Physicians portion to be filled out and faxed .Zack Seal

## 2020-09-10 NOTE — Telephone Encounter (Signed)
Pt c/o medication issue:  1. Name of Medication: sacubitril-valsartan (ENTRESTO) 24-26 MG  2. How are you currently taking this medication (dosage and times per day)?   3. Are you having a reaction (difficulty breathing--STAT)? no  4. What is your medication issue? Patient's daughters called to say that her mother needs to re enroll in patient assistant for the [facility]

## 2020-09-26 ENCOUNTER — Other Ambulatory Visit: Payer: Self-pay | Admitting: Internal Medicine

## 2020-10-02 ENCOUNTER — Telehealth: Payer: Self-pay

## 2020-10-02 NOTE — Telephone Encounter (Signed)
Patient assistance form  for [facility] faxed to [employer] Patient [employer]'s foundation   Will await approval.

## 2020-10-22 NOTE — Telephone Encounter (Signed)
Spoke to patient she stated she received a letter from patient assistance Sherryll Burger was approved.Stated patient assistance needs a prescription faxed to them.Advised Dr.Kelly is out of office this week.I will send a message to his RN.

## 2020-10-22 NOTE — Telephone Encounter (Signed)
Patient states she received a letter from [employer] stating the entresto was approved and they need a prescription from the office.

## 2020-10-28 ENCOUNTER — Ambulatory Visit: Payer: No Typology Code available for payment source | Admitting: Cardiovascular Disease

## 2020-10-31 ENCOUNTER — Other Ambulatory Visit: Payer: Self-pay

## 2020-10-31 MED ORDER — ENTRESTO 24-26 MG PO TABS: 1.0000 | ORAL_TABLET | Freq: Two times a day (BID) | ORAL | 11 refills | Status: DC

## 2020-11-21 NOTE — Telephone Encounter (Signed)
Spoke with [employer] rep who report pt was approved for patient assistance from 10/02/20-10/02/21. No further prescription needed.

## 2021-01-15 ENCOUNTER — Encounter: Payer: Self-pay | Admitting: Internal Medicine

## 2021-01-15 ENCOUNTER — Ambulatory Visit: Payer: Self-pay | Admitting: Internal Medicine

## 2021-01-15 ENCOUNTER — Other Ambulatory Visit: Payer: Self-pay

## 2021-01-15 VITALS — BP 90/50 | HR 64 | Resp 12 | Ht 59.0 in | Wt 123.0 lb

## 2021-01-15 DIAGNOSIS — E785 Hyperlipidemia, unspecified: Secondary | ICD-10-CM

## 2021-01-15 DIAGNOSIS — I10 Essential (primary) hypertension: Secondary | ICD-10-CM

## 2021-01-15 DIAGNOSIS — I255 Ischemic cardiomyopathy: Secondary | ICD-10-CM

## 2021-01-15 DIAGNOSIS — E119 Type 2 diabetes mellitus without complications: Secondary | ICD-10-CM

## 2021-01-15 DIAGNOSIS — N1832 Chronic kidney disease, stage 3b: Secondary | ICD-10-CM

## 2021-01-15 NOTE — Progress Notes (Signed)
    Subjective:    Patient ID: Sheila White, female   DOB: March 31, 1941, 80 y.o.   MRN: [id number]   HPI  Daughter interprets   DM:  A1C was not as good last August, up to 8.2%.  She is not physically active.  She is eating in a healthy manner.   Checking sugars--about 140-150 in mornings and after dinner recently.  2.  Dyslipidemia:  not fasting today.  Was not at goal last check due to elevated triglycerides.      3.  Ischemic cardiomyopathy:  No dyspnea or Chest pain.  No LE edema.  No PND or orthopnea.    4.  Hypertension:  BP low normal today.  Did have some light headedness yesterday, but that is not something that happens regularly.  She eats well, but could do better with fluid intake.     Current Meds  Medication Sig   aspirin 81 MG chewable tablet Chew 1 tablet (81 mg total) by mouth daily.   atorvastatin (LIPITOR) 80 MG tablet Take 1 tablet by mouth once daily   BRILINTA 60 MG TABS tablet Take 1 tablet by mouth twice daily   carvedilol (COREG) 6.25 MG tablet TAKE 1 TABLET BY MOUTH TWICE DAILY WITH A MEAL   famotidine (PEPCID) 20 MG tablet TAKE 2 TABLETS BY MOUTH AT BEDTIME   furosemide (LASIX) 20 MG tablet Take 1 tablet (20 mg total) by mouth daily as needed for fluid.   glimepiride (AMARYL) 2 MG tablet Take 1 tablet by mouth once daily with breakfast   isosorbide mononitrate (IMDUR) 30 MG 24 hr tablet Take 1/2 (one-half) tablet by mouth twice daily   loratadine (CLARITIN) 10 MG tablet Take 1 tablet (10 mg total) by mouth daily.   nitroGLYCERIN (NITROSTAT) 0.4 MG SL tablet Place 1 tablet (0.4 mg total) under the tongue every 5 (five) minutes x 3 doses as needed for chest pain.   Olopatadine HCl (PATADAY) 0.2 % SOLN 1 drop each eye twice daily as needed for eye allergies   sacubitril-valsartan (ENTRESTO) 24-26 MG Take 1 tablet by mouth 2 (two) times daily.   sertraline (ZOLOFT) 50 MG tablet TAKE 1/2 (ONE-HALF) TABLET BY MOUTH ONCE DAILY AT BEDTIME      No  Known Allergies   Review of Systems    Objective:   BP (!) 90/50 (BP Location: Left Arm, Patient Position: Sitting, Cuff Size: Normal)   Pulse 64   Resp 12   Ht 4\' 11"  (1.499 m)   Wt 123 lb (55.8 kg)   BMI 24.84 kg/m   Physical Exam NAD HEENT:  PERRL, EOMI, TMs pearly gray.  Throat without injection Neck:  Supple, No adenopathy Chest:  CTA CV:  RRR without murmur or rub. No carotid bruits.  CArotid, radial and DP pulses normal and equal. Abd:  S, NT, No HSM or mass, + BS LE:  No edema.    Assessment & Plan    DM:  A1C and urine microalbumin/crea with upcoming fasting labs.  Discussed goal of A1C under 7.0% to prevent progression of vascular disease.  2.  Hypertension with low normal BP today.  Hold on med change as not symptomatic.  If develops regular fatigue/light headedness, to call.  3.  Ischemic Cardiomyopathy:  compensated and stable  4.  Dyslipidemia:  FLP with fasting labs  5.  CKD:  CMP, CBC upcoming labs.  6.  HM:  Moderna booster with labs.

## 2021-01-22 ENCOUNTER — Telehealth: Payer: Self-pay

## 2021-01-22 NOTE — Telephone Encounter (Signed)
Pt daughter call to report that Sheila White has redness on the bottom of her right eye. She says it is a dark red color, unsure if it is blood. No pain or any other symptoms. Redness was first seen today 7/28. Pt wanted to report in case it is something serious that need attention

## 2021-01-22 NOTE — Telephone Encounter (Signed)
She is on anticoagulation, so if having a subconjunctival bleed, often times this spreads throughout the entire eye. A bleed is more of a solid red.  If it is just red from the blood vessels, she may have an infection or irritation.  I would have he seen at urgent care if continues to worsen to make sure everything is okay.  Tried to call with Jacqlyn Larsen here to interpret, but only able to get voicemail and leave a message to return phone call.

## 2021-01-23 NOTE — Telephone Encounter (Signed)
Pt notified and recommended to go to urgent care if worsen

## 2021-01-29 ENCOUNTER — Other Ambulatory Visit: Payer: Self-pay

## 2021-01-29 DIAGNOSIS — Z79899 Other long term (current) drug therapy: Secondary | ICD-10-CM

## 2021-01-29 DIAGNOSIS — E785 Hyperlipidemia, unspecified: Secondary | ICD-10-CM

## 2021-01-29 DIAGNOSIS — D649 Anemia, unspecified: Secondary | ICD-10-CM

## 2021-01-29 DIAGNOSIS — E119 Type 2 diabetes mellitus without complications: Secondary | ICD-10-CM

## 2021-01-30 LAB — CBC WITH DIFFERENTIAL/PLATELET
Basophils Absolute: 0 10*3/uL (ref 0.0–0.2)
Basos: 1 %
EOS (ABSOLUTE): 0.3 10*3/uL (ref 0.0–0.4)
Eos: 4 %
Hematocrit: 33.9 % — ABNORMAL LOW (ref 34.0–46.6)
Hemoglobin: 10.5 g/dL — ABNORMAL LOW (ref 11.1–15.9)
Immature Grans (Abs): 0 10*3/uL (ref 0.0–0.1)
Immature Granulocytes: 0 %
Lymphocytes Absolute: 2.2 10*3/uL (ref 0.7–3.1)
Lymphs: 33 %
MCH: 28.3 pg (ref 26.6–33.0)
MCHC: 31 g/dL — ABNORMAL LOW (ref 31.5–35.7)
MCV: 91 fL (ref 79–97)
Monocytes Absolute: 0.5 10*3/uL (ref 0.1–0.9)
Monocytes: 8 %
Neutrophils Absolute: 3.5 10*3/uL (ref 1.4–7.0)
Neutrophils: 54 %
Platelets: 186 10*3/uL (ref 150–450)
RBC: 3.71 x10E6/uL — ABNORMAL LOW (ref 3.77–5.28)
RDW: 12.7 % (ref 11.7–15.4)
WBC: 6.5 10*3/uL (ref 3.4–10.8)

## 2021-01-30 LAB — COMPREHENSIVE METABOLIC PANEL
ALT: 14 IU/L (ref 0–32)
AST: 15 IU/L (ref 0–40)
Albumin/Globulin Ratio: 1.5 (ref 1.2–2.2)
Albumin: 3.9 g/dL (ref 3.6–4.6)
Alkaline Phosphatase: 66 IU/L (ref 44–121)
BUN/Creatinine Ratio: 20 (ref 12–28)
BUN: 28 mg/dL — ABNORMAL HIGH (ref 8–27)
Bilirubin Total: <0.2 mg/dL (ref 0.0–1.2)
CO2: 17 mmol/L — ABNORMAL LOW (ref 20–29)
Calcium: 9 mg/dL (ref 8.7–10.3)
Chloride: 104 mmol/L (ref 96–106)
Creatinine, Ser: 1.42 mg/dL — ABNORMAL HIGH (ref 0.57–1.00)
Globulin, Total: 2.6 g/dL (ref 1.5–4.5)
Glucose: 153 mg/dL — ABNORMAL HIGH (ref 65–99)
Potassium: 5.7 mmol/L — ABNORMAL HIGH (ref 3.5–5.2)
Sodium: 134 mmol/L (ref 134–144)
Total Protein: 6.5 g/dL (ref 6.0–8.5)
eGFR: 37 mL/min/{1.73_m2} — ABNORMAL LOW (ref >59)

## 2021-01-30 LAB — HEMOGLOBIN A1C
Est. average glucose Bld gHb Est-mCnc: 177 mg/dL
Hgb A1c MFr Bld: 7.8 % — ABNORMAL HIGH (ref 4.8–5.6)

## 2021-01-30 LAB — LIPID PANEL W/O CHOL/HDL RATIO
Cholesterol, Total: 132 mg/dL (ref 100–199)
HDL: 51 mg/dL (ref >39)
LDL Chol Calc (NIH): 62 mg/dL (ref 0–99)
Triglycerides: 105 mg/dL (ref 0–149)
VLDL Cholesterol Cal: 19 mg/dL (ref 5–40)

## 2021-02-20 ENCOUNTER — Encounter: Payer: Self-pay | Admitting: Cardiovascular Disease

## 2021-02-20 ENCOUNTER — Other Ambulatory Visit: Payer: Self-pay

## 2021-02-20 ENCOUNTER — Ambulatory Visit (INDEPENDENT_AMBULATORY_CARE_PROVIDER_SITE_OTHER): Payer: Self-pay | Admitting: Cardiovascular Disease

## 2021-02-20 VITALS — BP 90/50 | HR 60 | Ht 59.0 in | Wt 124.4 lb

## 2021-02-20 DIAGNOSIS — I2510 Atherosclerotic heart disease of native coronary artery without angina pectoris: Secondary | ICD-10-CM

## 2021-02-20 DIAGNOSIS — I5042 Chronic combined systolic (congestive) and diastolic (congestive) heart failure: Secondary | ICD-10-CM

## 2021-02-20 DIAGNOSIS — N1832 Chronic kidney disease, stage 3b: Secondary | ICD-10-CM

## 2021-02-20 DIAGNOSIS — I951 Orthostatic hypotension: Secondary | ICD-10-CM

## 2021-02-20 DIAGNOSIS — E119 Type 2 diabetes mellitus without complications: Secondary | ICD-10-CM

## 2021-02-20 DIAGNOSIS — I255 Ischemic cardiomyopathy: Secondary | ICD-10-CM

## 2021-02-20 DIAGNOSIS — Z9861 Coronary angioplasty status: Secondary | ICD-10-CM

## 2021-02-20 DIAGNOSIS — E785 Hyperlipidemia, unspecified: Secondary | ICD-10-CM

## 2021-02-20 DIAGNOSIS — E875 Hyperkalemia: Secondary | ICD-10-CM

## 2021-02-20 DIAGNOSIS — I252 Old myocardial infarction: Secondary | ICD-10-CM

## 2021-02-20 MED ORDER — ENTRESTO 24-26 MG PO TABS: 1.0000 | ORAL_TABLET | Freq: Two times a day (BID) | ORAL | 3 refills | Status: DC

## 2021-02-20 MED ORDER — ISOSORBIDE MONONITRATE ER 30 MG PO TB24: ORAL_TABLET | ORAL | 3 refills | Status: DC

## 2021-02-20 MED ORDER — CARVEDILOL 3.125 MG PO TABS: 3.1250 mg | ORAL_TABLET | Freq: Two times a day (BID) | ORAL | 3 refills | Status: DC

## 2021-02-20 NOTE — Patient Instructions (Addendum)
Medication Instructions:   Decrease Isosorbide mono 15 mg ( 1/2 tablet of 30 mg)   ( morning) daily   Decrease  Carvedilol 3.125 mg  twice a day   Continue Entresto 24/26 mg  one tablet twice a day   *If you need a refill on your cardiac medications before your next appointment, please call your pharmacy*   Lab Work: Bmp- today If you have labs (blood work) drawn today and your tests are completely normal, you will receive your results only by: MyChart Message (if you have MyChart) OR A paper copy in the mail If you have any lab test that is abnormal or we need to change your treatment, we will call you to review the results.   Testing/Procedures: Not needed   Follow-Up: At [facility], you and your health needs are our priority.  As part of our continuing mission to provide you with exceptional heart care, we have created designated Provider Care Teams.  These Care Teams include your primary Cardiologist (physician) and Advanced Practice Providers (APPs -  Physician Assistants and Nurse Practitioners) who all work together to provide you with the care you need, when you need it.  We recommend signing up for the patient portal called "MyChart".  Sign up information is provided on this After Visit Summary.  MyChart is used to connect with patients for Virtual Visits (Telemedicine).  Patients are able to view lab/test results, encounter notes, upcoming appointments, etc.  Non-urgent messages can be sent to your provider as well.   To learn more about what you can do with MyChart, go to [email or web address].    Your next appointment:   1 month(s)  The format for your next appointment:   In Person  Provider:   You will see one of the following Advanced Practice Providers on your designated Care Team:   Azalee Course, PA-C Micah Flesher, PA-C or  Judy Pimple, PA-C Pharmacy team  Then, Nicki Guadalajara, MD will plan to see you again in 3 month(s).   Other Instructions  recommends  you purchase some compression  socks/hose from [facility] in [place] ,[place]. You do not need an prescription to purchase the items.  Address  [address] [place], [place] [address]  Phone  [phone]   Compression   strength    8-15 mmHg 15-20 mmHg                         x  20-30 mmHg  30-40 mmHg.  You may also try a medical supply store, department store (i.e.- [employer], [employer], [employer], specialty shoe stores ( shoe market), [employer] and [employer]) or  [employer] store.

## 2021-02-20 NOTE — Progress Notes (Signed)
Cardiology Office Note    Date:  02/21/2021   ID:  She[facility], [place] 1940-10-06, MRN [id number]  PCP:  She[facility] Hook, MD  Cardiologist:  Sheila Majestic, MD   18 month F/u office visit .  History of Present Illness:  [facility] is a 80 y.o. female who is originally from [name].  She presented to [facility] on October 05, 2017 with an ST segment elevation anterior myocardial infarction with late presentation.  Her symptoms had developed the day prior to admission and became more progressive with reference to shortness of breath and continued chest pain.  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  She had 50% proximal OM1 stenosis followed by 95% distal marginal stenosis and there was mild irregularity of the RCA.  She underwent successful PCI to the LAD with ultimate insertion of a 2.5 x 26 mm Resolute DES stent postdilated 2.8 proximally and 2.68 distally with a 99% stenosis reduced to 0%.  The mid LAD stenosis was treated with DES stenting with a 2 to 5 x 12 mm Resolute stent.  The apical stenosis was treated with PTCA.  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  Her peak creatinine was 3.86 which improved to 1.88 at discharge.  He was ultimately discharged on October 24, 2017 and on Oct 28, 2017 so [facility] for her initial office evaluation.  Patient has had issues with chronic cough.  Her pericardial symptoms had improved and she was treated with colchicine.    I saw her for initial post hospital follow-up evaluation on Nov 18, 2017.  At that time she denied any recurrent chest pain symptomatology.  She was tentatively scheduled to travel to [place] several days later and I recommended that she postpone this trip.  Since I saw her, she  developed some  chest pain and was hospitalized on May 25 through Nov 22, 2017.  Troponins were normal.  Follow-up echo Doppler study showed an EF of 30 to 35%.  There was mid distal anterior, apical inferoapical severe hypokinesis secondary to her prior LAD territory infarct.  There was grade 1 diastolic dysfunction.  She also complained of some blurry vision with gait abnormality.  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  She was hyponatremic which revolved resolved.  She was seen by [facility] post discharge on December 01, 2017.  At that time, her blood pressure was stable and pulse 77.  I saw her in June 2019.  At that time the plan was to start Plavix due to her foreign status and apparently Plavix only rather than Brilinta could be used.  However, P2Y12 test demonstrated that she was not responsive to Plavix.  As a result we have been supplying her with samples of Brilinta.   When I saw her in September 2019 her renal function was gradually improving.  She was not having any anginal symptoms and her blood pressure was stable.  I last saw her in the office in January 2020 at which time she continued to feel well.    Most recent laboratory has shown further improvement in renal function with a creatinine of 1.48 improved from 1.61 in December 2019.  She has continued to be stable.  She has been taking aspirin and Brilinta for DAPT.  She is on atorvastatin 80 mg for hyperlipidemia  with target LDL less than 70.  She is unaware of any palpitations and continues to take carvedilol 6.25 mg twice a day.  She has been taking furosemide 40 mg daily without edema.  Renal function had stabilized with creatinines in the 1.4 range.  At that time, I elected to initiate low-dose Entresto in light of her continued LV dysfunction EF at 30 to 35%.  There were no signs of overload.  I recommended follow-up laboratory 2 weeks later and reassessment with our pharmacist.  She was evaluated by Sheila Deforest, PA in a telemedicine visit in  April 2020.  She was doing well and her Lasix dose had been reduced to allow the addition of Entresto at her prior evaluation.  Since her blood pressure had been low at the pharmacy evaluation she continues to be on the 24/26 mg twice daily regimen of Entresto.  She is a Plavix nonresponder and for this reason has been maintained on Brilinta.  I saw her in July 17, 2019 for 1 year evaluation. She continued to do well.  She specifically denied chest pain or shortness of breath.  She has noticed significant benefit with the initiation of Entresto.  She had not had recent laboratory since July 2020 and at that time creatinine was 1.55.  Lipid studies revealed a cholesterol of 158, HDL 61, LDL 77 and triglycerides 100.  During that evaluation, I recommended that she undergo a follow-up echo Doppler study to reassess LV function.  She continues to be on aspirin/Brilinta and at that time per Pegasys trial data I reduced her Brilinta dose to 60 mg twice a day.  I recommended she undergo follow-up laboratory.    Her most recent echo Doppler study of July 25, 2019 showed continued reduced LV function with EF of 25 to 30%, mild LVH and grade 3, restrictive physiology diastolic dysfunction.  She had moderate dilation of her left atrium.  There was moderate mitral annular calcification with mild MR, mild TR and mild PR.  She continued to feel well and was without anginal symptoms or significant shortness of breath.  Since I last saw her, she has been seen by several APP's and also was evaluated by [facility] for consideration of possible implantable cardioverter defibrillator.  Ultimately, the patient decided against this and has not received therapy.  Presently, she denies any chest pain.  She believes she is feeling well.  She denies significant dizziness but if she stands up quickly she does get a little lightheaded.  She continues to be on aspirin and low-dose Brilinta 60 mg twice a day per Pegasys trial  data.  She is on guideline directed medical therapy with carvedilol 6.25 mg twice a day, Entresto 24/26 mg twice a day, and continues to be on isosorbide 15 mg twice a day and 20 mg of furosemide which she states she only rarely takes.  She denies recent swelling, palpitations, chest pain, PND orthopnea.  She presents to the office today for evaluation and is here with her daughter as well as an interpreter.  Past Medical History:  Diagnosis Date   Acute combined systolic and diastolic CHF, NYHA class 3 ([facility]) 10/10/2017   in setting of MI   Acute cystitis without hematuria    Acute on chronic systolic congestive heart failure ([facility]) 10/20/2017   Anxiety and depression 12/19/2017   Chest pain 10/31/2017   Chest pain, rule out acute myocardial infarction 11/19/2017   Coronary artery disease 10/31/2017   Dyspnea 10/05/2017  Heart failure ([facility]) 12/23/2017   HOH (hard of hearing)    Ischemic cardiomyopathy 10/05/2017   MI, acute, non ST segment elevation ([facility])    Non-insulin dependent type 2 diabetes mellitus ([facility]) 2004   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   STEMI involving left anterior descending coronary artery ([facility]) 10/05/2017    Past Surgical History:  Procedure Laterality Date   CORONARY/GRAFT ACUTE MI REVASCULARIZATION N/A 10/05/2017   Procedure: Coronary/Graft Acute MI Revascularization;  Surgeon: [facility] Sine, MD;  Location: [facility];  Service: Cardiovascular;  Laterality: N/A;   LEFT HEART CATH AND CORONARY ANGIOGRAPHY N/A 10/05/2017   Procedure: LEFT HEART CATH AND CORONARY ANGIOGRAPHY;  Surgeon: [facility] Sine, MD;  Location: [facility];  Service: Cardiovascular;  Laterality: N/A;    Current Medications: Outpatient Medications Prior to Visit  Medication Sig Dispense Refill   aspirin 81 MG chewable tablet Chew 1 tablet (81 mg total) by mouth daily. 60 tablet 2   atorvastatin (LIPITOR) 80 MG tablet Take 1 tablet by mouth once daily 30 tablet 11   BRILINTA  60 MG TABS tablet Take 1 tablet by mouth twice daily 180 tablet 3   famotidine (PEPCID) 20 MG tablet TAKE 2 TABLETS BY MOUTH AT BEDTIME 120 tablet 0   furosemide (LASIX) 20 MG tablet Take 1 tablet (20 mg total) by mouth daily as needed for fluid. 30 tablet 1   glimepiride (AMARYL) 2 MG tablet Take 1 tablet by mouth once daily with breakfast 90 tablet 3   loratadine (CLARITIN) 10 MG tablet Take 1 tablet (10 mg total) by mouth daily. 60 tablet 2   nitroGLYCERIN (NITROSTAT) 0.4 MG SL tablet Place 1 tablet (0.4 mg total) under the tongue every 5 (five) minutes x 3 doses as needed for chest pain. 25 tablet 2   Olopatadine HCl (PATADAY) 0.2 % SOLN 1 drop each eye twice daily as needed for eye allergies 2.5 mL 11   sertraline (ZOLOFT) 50 MG tablet TAKE 1/2 (ONE-HALF) TABLET BY MOUTH ONCE DAILY AT BEDTIME 15 tablet 10   triamcinolone cream (KENALOG) 0.1 % Apply 1 application topically 2 (two) times daily. 30 g 1   carvedilol (COREG) 6.25 MG tablet TAKE 1 TABLET BY MOUTH TWICE DAILY WITH A MEAL 180 tablet 2   isosorbide mononitrate (IMDUR) 30 MG 24 hr tablet Take 1/2 (one-half) tablet by mouth twice daily 90 tablet 2   sacubitril-valsartan (ENTRESTO) 24-26 MG Take 1 tablet by mouth 2 (two) times daily. 60 tablet 11   No facility-administered medications prior to visit.     Allergies:   Patient has no known allergies.   Social History   Socioeconomic History   Marital status: Widowed    Spouse name: Not on file   Number of children: 8   Years of education: 9   Highest education level: Not on file  Occupational History   Occupation: Housewife, retired  Tobacco Use   Smoking status: Never   Smokeless tobacco: Never  Vaping Use   Vaping Use: Never used  Substance and Sexual Activity   Alcohol use: Never   Drug use: Never   Sexual activity: Not Currently  Other Topics Concern   Not on file  Social History Narrative   Living with daughter, [facility] and her husband and her 3 sons.   Social  Determinants of Health   Financial Resource Strain: Not on file  Food Insecurity: Not on file  Transportation Needs: Not on file  Physical Activity:  Not on file  Stress: Not on file  Social Connections: Not on file     Family History:  The patient's family history includes Alcohol abuse in her father.   ROS General: Negative; No fevers, chills, or night sweats;  HEENT: Negative; No changes in vision or hearing, sinus congestion, difficulty swallowing Pulmonary: Negative; No cough, wheezing, shortness of breath, hemoptysis Cardiovascular: See HPI GI: Obvious nausea, resolved Musculoskeletal: Negative; no myalgias, joint pain, or weakness Hematologic/Oncology: Negative; no easy bruising, bleeding Endocrine: Negative; no heat/cold intolerance; no diabetes Neuro: Negative; no changes in balance, headaches; previous dizziness resolved Skin: Negative; No rashes or skin lesions Psychiatric: Negative; No behavioral problems, depression Sleep: Negative; No snoring, daytime sleepiness, hypersomnolence, bruxism, restless legs, hypnogognic hallucinations, no cataplexy Other comprehensive 14 point system review is negative.   PHYSICAL EXAM:   VS:  BP (!) 90/50 (BP Location: Right Arm)   Pulse 60   Ht _0  (1.499 m)   Wt 124 lb 6.4 oz (56.4 kg)   SpO2 98%   BMI 25.13 kg/m     Repeat blood pressure by me was 110/70 supine and her blood pressure dropped to 85/50.  Wt Readings from Last 3 Encounters:  02/20/21 124 lb 6.4 oz (56.4 kg)  01/15/21 123 lb (55.8 kg)  02/13/20 126 lb (57.2 kg)    General: Alert, oriented, no distress.  Skin: normal turgor, no rashes, warm and dry HEENT: Normocephalic, atraumatic. Pupils equal round and reactive to light; sclera anicteric; extraocular muscles intact;  Nose without nasal septal hypertrophy Mouth/Parynx benign; Mallinpatti scale 3 Neck: No JVD, no carotid bruits; normal carotid upstroke Lungs: clear to ausculatation and percussion; no  wheezing or rales Chest wall: without tenderness to palpitation Heart: PMI not displaced, RRR, s1 s2 normal, 1/6 systolic murmur, no diastolic murmur, no rubs, gallops, thrills, or heaves Abdomen: soft, nontender; no hepatosplenomehaly, BS+; abdominal aorta nontender and not dilated by palpation. Back: no CVA tenderness Pulses 2+ Musculoskeletal: full range of motion, normal strength, no joint deformities Extremities: no clubbing cyanosis or edema, Homan's sign negative  Neurologic: grossly nonfocal; Cranial nerves grossly wnl Psychologic: Normal mood and affect  Studies/Labs Reviewed:   February 20, 2021  ECG (independently read by me): NSR at 60, LAD, QS V1-6 c/w prior anterior MI; no ectopy, normal intervals  February 2021ECG (independently read by me): Normal sinus rhythm at 75 bpm.  QS complex V1 through V5, 1 and aVL with T wave inversion V3 through V6, 1 and aVL consistent with large old anterolateral lateral myocardial infarction.  No ectopy.  QTc interval 419 ms.  January 2021 ECG (independently read by me):Normal sinus rhythm at 67 bpm.  Left anterior hemiblock.  Old anterior MI with Q waves and poor R wave progression throughout the entire precordium  July 27, 2018 ECG (independently read by me): Normal sinus rhythm at 62 bpm.  Old inferior MI and anterior MI.  Left axis deviation.  No ectopy.  QTc interval 430 ms.  March 27, 2018 ECG (independently read by me): Normal sinus rhythm at 78 bpm.  Old anterior MI with QS V1 through V5 and inferior Q waves.  No ectopy.  December 20, 2017 ECG (independently read by me): Normal sinus rhythm 70 bpm.  Old anterior MI with poor progression V1 through V5 with Q waves noted in 3 and aVF.  Normal intervals.  No ectopy.  11/18/2017 ECG (independently read by me): Sinus rhythm at 86 bpm.  Left anterior hemiblock.  QS complex V1  through V5 consistent with anterior infarct.  Inferior Q waves suggestive of inferior MI.  Normal intervals.  No  ectopy.  Recent Labs: BMP Latest Ref Rng & Units 02/20/2021 01/29/2021 02/13/2020  Glucose 65 - 99 mg/dL 150(H) 153(H) 122(H)  BUN 8 - 27 mg/dL 46(H) 28(H) 48(H)  Creatinine 0.57 - 1.00 mg/dL 1.50(H) 1.42(H) 1.45(H)  BUN/Creat Ratio 12 - 28 31(H) 20 33(H)  Sodium 134 - 144 mmol/L 138 134 137  Potassium 3.5 - 5.2 mmol/L 5.1 5.7(H) 5.7(H)  Chloride 96 - 106 mmol/L 102 104 106  CO2 20 - 29 mmol/L 21 17(L) 19(L)  Calcium 8.7 - 10.3 mg/dL 9.1 9.0 8.9     Hepatic Function Latest Ref Rng & Units 01/29/2021 02/13/2020 08/20/2019  Total Protein 6.0 - 8.5 g/dL 6.5 6.6 6.7  Albumin 3.6 - 4.6 g/dL 3.9 3.9 4.1  AST 0 - 40 IU/L _0 ALT 0 - 32 IU/L _1 Alk Phosphatase 44 - 121 IU/L 66 313(H) 98  Total Bilirubin 0.0 - 1.2 mg/dL <0.2 0.2 <0.2  Bilirubin, Direct 0.1 - 0.5 mg/dL - - -    CBC Latest Ref Rng & Units 01/29/2021 02/13/2020 08/20/2019  WBC 3.4 - 10.8 x10E3/uL 6.5 5.4 7.2  Hemoglobin 11.1 - 15.9 g/dL 10.5(L) 10.5(L) 11.4  Hematocrit 34.0 - 46.6 % 33.9(L) 33.0(L) 35.4  Platelets 150 - 450 x10E3/uL 186 195 237   Lab Results  Component Value Date   MCV 91 01/29/2021   MCV 92 02/13/2020   MCV 88 08/20/2019   Lab Results  Component Value Date   TSH 2.617 11/20/2017   Lab Results  Component Value Date   HGBA1C 7.8 (H) 01/29/2021     BNP    Component Value Date/Time   BNP 3,381.1 (H) 06/04/2018 0754    ProBNP    Component Value Date/Time   PROBNP 5,280 (H) 08/07/2018 1247     Lipid Panel     Component Value Date/Time   CHOL 132 01/29/2021 0926   TRIG 105 01/29/2021 0926   HDL 51 01/29/2021 0926   CHOLHDL 3.3 10/06/2017 0256   VLDL 22 10/06/2017 0256   LDLCALC 62 01/29/2021 0926     RADIOLOGY: No results found.   Additional studies/ records that were reviewed today include:  I reviewed her 3-week hospitalization records in detail.  I reviewed her follow-up office visit with [facility].  Laboratory catheterization studies, echo Doppler assessment were  reviewed.  I reviewed her hospitalization, and follow-up office visit since my last evaluation. Subsequent evaluation since my January 2020 evaluation were reviewed.   ECHO 07/25/2019 IMPRESSIONS   1. Left ventricular ejection fraction, by visual estimation, is 25 to  30%. The left ventricle has severely decreased function. There is mildly  increased left ventricular hypertrophy.   2. Definity contrast agent was given IV to delineate the left ventricular  endocardial borders.   3. Left ventricular diastolic parameters are consistent with Grade III  diastolic dysfunction (restrictive).   4. Severely dilated left ventricular internal cavity size.   5. The left ventricle demonstrates regional wall motion abnormalities.   6. Only basal function preserved mid and apical inferior, anterior,  septum and apex severely hypokinetic.   7. Global right ventricle has normal systolic function.The right  ventricular size is normal. No increase in right ventricular wall  thickness.   8. Left atrial size was moderately dilated.   9. Right atrial size was normal.  10. Moderate calcification of the  mitral valve leaflet(s).  11. Moderate mitral annular calcification.  12. Moderate thickening of the mitral valve leaflet(s).  13. The mitral valve is normal in structure. Mild mitral valve  regurgitation.  14. The tricuspid valve is normal in structure.  15. The tricuspid valve is normal in structure. Tricuspid valve  regurgitation is mild.  16. The aortic valve is tricuspid. Aortic valve regurgitation is mild.  severe sclerosis especially the non coronary cusp.  17. Pulmonic regurgitation is mild.  18. The pulmonic valve was grossly normal. Pulmonic valve regurgitation is  mild.   ASSESSMENT:    1. Orthostatic hypotension   2. Ischemic cardiomyopathy   3. Chronic combined systolic (congestive) and diastolic (congestive) heart failure ([facility])   4. History of ST elevation myocardial infarction (STEMI):  April 2019   5. CAD S/P percutaneous coronary angioplasty   6. Hyperkalemia   7. Stage 3b chronic kidney disease ([place])   8. Dyslipidemia, goal LDL below 70   9. Non-insulin dependent type 2 diabetes mellitus [facility])     PLAN:   [facility] is a very pleasant 80 year-old female who is originally from [name] and presented with a late presentation anterior ST segment elevation MI in April 2019.  She had an initial significant ischemic cardiomyopathy with an EF of 30% with grade 2 diastolic dysfunction and developed post infarct pericarditis treated with Decadron and colchicine and during her hospitalization and developed significant hyponatremia, acute kidney injury, with ultimate improvement.  Her creatinine has improved.   She has had issues with low blood pressure and was not on an ACE or ARB therapy due to previous renal insufficiency and low blood pressure.  Remotely, she had been started on hydralazine but this had to be discontinued due to low blood pressure.  A follow-up echo showed an EF of 30 to 35% and is concordant with her late presentation anterior MI.  Renal function ultimately stabilized and since January 2020 she was started on Entresto and has been maintained on low-dose 24/26 mg twice daily.  She continues to be on isosorbide 30 mg, furosemide 20 mg on an as-needed basis in addition to carvedilol 6.25 mg twice a day.  She has felt significantly better since initiating Entresto and remained euvolemic and has denied any recurrent exertional dyspnea.  She is a Plavix nonresponder and has been on Brilinta since her intervention.  When I saw her in January 2021 I reduced her Brilinta dose to 60 mg twice a day per Pegasys trial data.  Her most recent echo on July 25, 2019 continued to show severe LV dysfunction with EF of 25 to 30% and restrictive physiology grade 3 diastolic dysfunction.  Laboratory from August 20, 2019 showed further improvement in creatinine at 1.36 although her  potassium was elevated and I discussed avoidance of potassium containing foods.  With her continued LV dysfunction when I last saw her in February 2021 I discussed potential prophylactic ICD implantation in light of her persistent ischemic cardiomyopathy.  She ultimately was evaluated by Dr. Lovena Le but ultimately the patient decided against ICD implantation.  Presently, she feels well and is not having any anginal symptoms or dyspnea.  She has noted some lightheadedness if she stands abruptly.  Her blood pressure today is low and she is orthostatic on my evaluation.  She is not a candidate for dose titration of Entresto.  With her low blood pressure I have recommended she reduce her isosorbide from 15 mg twice a day to just  15 mg daily.  I am also reducing carvedilol from 6.25 mg twice a day down to 3.125 mg twice a day.  She continues to be on DAPT with aspirin/Brilinta.  She is diabetic on glimepiride.  She may be a candidate for SGLT2 inhibition but with her low blood pressure presently this will be deferred.  She apparently had laboratory not sent by me on January 29, 2021.  Creatinine was 1.42.  Potassium was elevated at follow-up the next was recommended today.  She continues to be on atorvastatin 80 mg for hyperlipidemia.  I have recommended she have a follow-up evaluation in 4 weeks with either a pharmacist or APP and have suggested support stockings.  I will see her in 3 months  Medication Adjustments/Labs and Tests Ordered: Current medicines are reviewed at length with the patient today.  Concerns regarding medicines are outlined above.  Medication changes, Labs and Tests ordered today are listed in the Patient Instructions below.    Signed, Sheila Majestic, MD  02/21/2021 3:59 PM    [facility] [address], [place], [place], [place]  [address] Phone: [phone]

## 2021-02-21 ENCOUNTER — Encounter: Payer: Self-pay | Admitting: Cardiovascular Disease

## 2021-02-21 LAB — BASIC METABOLIC PANEL
BUN/Creatinine Ratio: 31 — ABNORMAL HIGH (ref 12–28)
BUN: 46 mg/dL — ABNORMAL HIGH (ref 8–27)
CO2: 21 mmol/L (ref 20–29)
Calcium: 9.1 mg/dL (ref 8.7–10.3)
Chloride: 102 mmol/L (ref 96–106)
Creatinine, Ser: 1.5 mg/dL — ABNORMAL HIGH (ref 0.57–1.00)
Glucose: 150 mg/dL — ABNORMAL HIGH (ref 65–99)
Potassium: 5.1 mmol/L (ref 3.5–5.2)
Sodium: 138 mmol/L (ref 134–144)
eGFR: 35 mL/min/{1.73_m2} — ABNORMAL LOW (ref >59)

## 2021-03-13 ENCOUNTER — Ambulatory Visit (INDEPENDENT_AMBULATORY_CARE_PROVIDER_SITE_OTHER): Payer: No Typology Code available for payment source | Admitting: Physician Assistant

## 2021-03-13 ENCOUNTER — Encounter: Payer: Self-pay | Admitting: Physician Assistant

## 2021-03-13 ENCOUNTER — Other Ambulatory Visit: Payer: Self-pay

## 2021-03-13 VITALS — BP 110/60 | HR 71 | Ht 59.0 in | Wt 123.6 lb

## 2021-03-13 DIAGNOSIS — E785 Hyperlipidemia, unspecified: Secondary | ICD-10-CM

## 2021-03-13 DIAGNOSIS — I959 Hypotension, unspecified: Secondary | ICD-10-CM

## 2021-03-13 DIAGNOSIS — E119 Type 2 diabetes mellitus without complications: Secondary | ICD-10-CM

## 2021-03-13 DIAGNOSIS — I5042 Chronic combined systolic (congestive) and diastolic (congestive) heart failure: Secondary | ICD-10-CM

## 2021-03-13 DIAGNOSIS — N183 Chronic kidney disease, stage 3 unspecified: Secondary | ICD-10-CM

## 2021-03-13 MED ORDER — ENTRESTO 24-26 MG PO TABS: 1.0000 | ORAL_TABLET | Freq: Two times a day (BID) | ORAL | 3 refills | Status: DC

## 2021-03-13 NOTE — Patient Instructions (Signed)
Medication Instructions:  STOP Imdur  *If you need a refill on your cardiac medications before your next appointment, please call your pharmacy*  Lab Work: NONE ordered at this time of appointment   If you have labs (blood work) drawn today and your tests are completely normal, you will receive your results only by: MyChart Message (if you have MyChart) OR A paper copy in the mail If you have any lab test that is abnormal or we need to change your treatment, we will call you to review the results.  Testing/Procedures: NONE ordered at this time of appointment   Follow-Up: At [facility], you and your health needs are our priority.  As part of our continuing mission to provide you with exceptional heart care, we have created designated Provider Care Teams.  These Care Teams include your primary Cardiologist (physician) and Advanced Practice Providers (APPs -  Physician Assistants and Nurse Practitioners) who all work together to provide you with the care you need, when you need it.  We recommend signing up for the patient portal called "MyChart".  Sign up information is provided on this After Visit Summary.  MyChart is used to connect with patients for Virtual Visits (Telemedicine).  Patients are able to view lab/test results, encounter notes, upcoming appointments, etc.  Non-urgent messages can be sent to your provider as well.   To learn more about what you can do with MyChart, go to [email or web address].    Your next appointment:   3 week(s)  The format for your next appointment:   In Person  Provider:   Azalee Course, PA-C  Other Instructions

## 2021-03-13 NOTE — Progress Notes (Signed)
Cardiology Office Note:    Date:  03/15/2021   ID:  Sheila White, Sheila White [birth year], MRN [id number]  PCP:  Julieanne Manson, MD   [facility] Providers Cardiologist:  Nicki Guadalajara, MD     Referring MD: Julieanne Manson, MD   Chief Complaint  Patient presents with   Follow-up    Seen for Dr. Tresa Endo    History of Present Illness:    Sheila White is a 80 y.o. female with a hx of combined systolic and diastolic heart failure, CAD, Dressler syndrome, history of CVA, hyperlipidemia, CKD stage III and DM2.  She originally presented to [facility] on 10/05/2017 with late presenti anterior STEMI.  Emergent cardiac catheterization revealed a subtotally occluded long proximal LAD of 99%, 90% mid LAD lesion, 99% apical lesion, 50% proximal OM1 lesion followed by 95% distal marginal stenosis and mild irregularities in the RCA.  She ultimately underwent 2 separate DES to proximal LAD and mid LAD, apical LAD lesion treated with PTCA.  Hospital course was complicated by postinfarction pericarditis, AKI, hyponatremia, abdominal pain, weakness and persistent cough.  Her creatinine peaked at 3.86 which improved to 1.8 at discharge.  Symptom improved with colchicine.  Follow-up echocardiogram in May 2019 showed EF 30 to 35%, mid to distal anterior, apical inferior and apical severe hypokinesis secondary to prior LAD territory infarct, grade 1 DD.  She developed some blurry vision with gait abnormality.  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  She is a Plavix nonresponder, therefore was maintained on Brilinta.  After she completed 90 mg Brilinta for 1 year, this was reduced to 60 mg maintenance therapy.  Last echocardiogram obtained in January 2021 showed EF 25 to 30%, mild LVH, grade 3 DD, restrictive physiology diastolic dysfunction, mild MR and mild TR.  She was referred to Dr. Ladona Ridgel for consideration of possible ICD, however patient decided against this.   More  recently, patient was seen by Dr. Tresa Endo on 02/20/2021, blood pressure was very low and the patient was orthostatic.  Imdur was decreased to 15 mg daily, carvedilol decreased to 3.125 mg twice a day.  She presents today for reassessment.  In early August, her blood work showed elevated potassium of 5.7.  Repeat blood work during the last office visit showed potassium has came down to 5.1.  Creatinine stable around 1.5.  Patient presents today for follow-up, on initial arrival her blood pressure was 110/60.  I checked her blood pressure manually myself.  Her right arm blood pressure 78/48.  Left arm pressure 84/48.  Because of the large discrepancy, I used automatic cuff to recheck her blood pressure again.  Right arm pressure 91/60, left arm pressure 101/60.  I recommended discontinuation of Imdur to help raise the blood pressure.  I will reassess her blood pressure in 3 weeks on the next follow-up.  Interview was conducted with the help of translator Ms. Crecia.  Family also need help with medication assistance of Entresto.  She previously qualified for 1 year of Entresto through medication assistance program, however family had to call the medication assistance program every month in order to obtain 1 month supply.  They want to see if they can get 80-month supply each time.    Past Medical History:  Diagnosis Date   Acute combined systolic and diastolic CHF, NYHA class 3 (HCC) 10/10/2017   in setting of MI   Acute cystitis without hematuria    Acute on chronic systolic congestive heart failure (  HCC) 10/20/2017   Anxiety and depression 12/19/2017   Chest pain 10/31/2017   Chest pain, rule out acute myocardial infarction 11/19/2017   Coronary artery disease 10/31/2017   Dyspnea 10/05/2017   Heart failure (HCC) 12/23/2017   HOH (hard of hearing)    Ischemic cardiomyopathy 10/05/2017   MI, acute, non ST segment elevation (HCC)    Non-insulin dependent type 2 diabetes mellitus (HCC) 2004   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   STEMI involving left anterior descending coronary artery (HCC) 10/05/2017    Past Surgical History:  Procedure Laterality Date   CORONARY/GRAFT ACUTE MI REVASCULARIZATION N/A 10/05/2017   Procedure: Coronary/Graft Acute MI Revascularization;  Surgeon: Lennette Bihari, MD;  Location: [facility] INVASIVE CV LAB;  Service: Cardiovascular;  Laterality: N/A;   LEFT HEART CATH AND CORONARY ANGIOGRAPHY N/A 10/05/2017   Procedure: LEFT HEART CATH AND CORONARY ANGIOGRAPHY;  Surgeon: Lennette Bihari, MD;  Location: [facility] INVASIVE CV LAB;  Service: Cardiovascular;  Laterality: N/A;    Current Medications: Current Meds  Medication Sig   aspirin 81 MG chewable tablet Chew 1 tablet (81 mg total) by mouth daily.   atorvastatin (LIPITOR) 80 MG tablet Take 1 tablet by mouth once daily   BRILINTA 60 MG TABS tablet Take 1 tablet by mouth twice daily   carvedilol (COREG) 3.125 MG tablet Take 1 tablet (3.125 mg total) by mouth 2 (two) times daily.   famotidine (PEPCID) 20 MG tablet TAKE 2 TABLETS BY MOUTH AT BEDTIME   furosemide (LASIX) 20 MG tablet Take 1 tablet (20 mg total) by mouth daily as needed for fluid.   glimepiride (AMARYL) 2 MG tablet Take 1 tablet by mouth once daily with breakfast   loratadine (CLARITIN) 10 MG tablet Take 1 tablet (10 mg total) by mouth daily.   nitroGLYCERIN (NITROSTAT) 0.4 MG SL tablet Place 1 tablet (0.4 mg total) under the tongue every 5 (five) minutes x 3 doses as needed for chest pain.   Olopatadine HCl (PATADAY) 0.2 % SOLN 1 drop each eye twice daily as needed for eye allergies   sertraline (ZOLOFT) 50 MG tablet TAKE 1/2 (ONE-HALF) TABLET BY MOUTH ONCE DAILY AT BEDTIME   triamcinolone cream (KENALOG) 0.1 % Apply 1 application topically 2 (two) times daily.   [DISCONTINUED] isosorbide mononitrate (IMDUR) 30 MG 24 hr tablet Take 1/2 ( one-half) tablet in the morning only   [DISCONTINUED] sacubitril-valsartan (ENTRESTO) 24-26 MG Take 1 tablet by mouth 2  (two) times daily.     Allergies:   Patient has no known allergies.   Social History   Socioeconomic History   Marital status: Widowed    Spouse name: Not on file   Number of children: 8   Years of education: 9   Highest education level: Not on file  Occupational History   Occupation: Housewife, retired  Tobacco Use   Smoking status: Never   Smokeless tobacco: Never  Vaping Use   Vaping Use: Never used  Substance and Sexual Activity   Alcohol use: Never   Drug use: Never   Sexual activity: Not Currently  Other Topics Concern   Not on file  Social History Narrative   Living with daughter, Wray Kearns and her husband and her 3 sons.   Social Determinants of Health   Financial Resource Strain: Not on file  Food Insecurity: Not on file  Transportation Needs: Not on file  Physical Activity: Not on file  Stress: Not on file  Social Connections: Not on file  Family History: The patient's family history includes Alcohol abuse in her father.  ROS:   Please see the history of present illness.     All other systems reviewed and are negative.  EKGs/Labs/Other Studies Reviewed:    The following studies were reviewed today:  Echo 07/25/2019  1. Left ventricular ejection fraction, by visual estimation, is 25 to  30%. The left ventricle has severely decreased function. There is mildly  increased left ventricular hypertrophy.   2. Definity contrast agent was given IV to delineate the left ventricular  endocardial borders.   3. Left ventricular diastolic parameters are consistent with Grade III  diastolic dysfunction (restrictive).   4. Severely dilated left ventricular internal cavity size.   5. The left ventricle demonstrates regional wall motion abnormalities.   6. Only basal function preserved mid and apical inferior, anterior,  septum and apex severely hypokinetic.   7. Global right ventricle has normal systolic function.The right  ventricular size is normal. No  increase in right ventricular wall  thickness.   8. Left atrial size was moderately dilated.   9. Right atrial size was normal.  10. Moderate calcification of the mitral valve leaflet(s).  11. Moderate mitral annular calcification.  12. Moderate thickening of the mitral valve leaflet(s).  13. The mitral valve is normal in structure. Mild mitral valve  regurgitation.  14. The tricuspid valve is normal in structure.  15. The tricuspid valve is normal in structure. Tricuspid valve  regurgitation is mild.  16. The aortic valve is tricuspid. Aortic valve regurgitation is mild.  severe sclerosis especially the non coronary cusp.  17. Pulmonic regurgitation is mild.  18. The pulmonic valve was grossly normal. Pulmonic valve regurgitation is  mild.   EKG:  EKG is not ordered today.    Recent Labs: 01/29/2021: ALT 14; Hemoglobin 10.5; Platelets 186 02/20/2021: BUN 46; Creatinine, Ser 1.50; Potassium 5.1; Sodium 138  Recent Lipid Panel    Component Value Date/Time   CHOL 132 01/29/2021 0926   TRIG 105 01/29/2021 0926   HDL 51 01/29/2021 0926   CHOLHDL 3.3 10/06/2017 0256   VLDL 22 10/06/2017 0256   LDLCALC 62 01/29/2021 0926     Risk Assessment/Calculations:           Physical Exam:    VS:  BP 110/60 (BP Location: Right Arm)   Pulse 71   Ht 4\' 11"  (1.499 m)   Wt 123 lb 9.6 oz (56.1 kg)   SpO2 98%   BMI 24.96 kg/m     Wt Readings from Last 3 Encounters:  03/13/21 123 lb 9.6 oz (56.1 kg)  02/20/21 124 lb 6.4 oz (56.4 kg)  01/15/21 123 lb (55.8 kg)     GEN:  Well nourished, well developed in no acute distress HEENT: Normal NECK: No JVD; No carotid bruits LYMPHATICS: No lymphadenopathy CARDIAC: RRR, no murmurs, rubs, gallops RESPIRATORY:  Clear to auscultation without rales, wheezing or rhonchi  ABDOMEN: Soft, non-tender, non-distended MUSCULOSKELETAL:  No edema; No deformity  SKIN: Warm and dry NEUROLOGIC:  Alert and oriented x 3 PSYCHIATRIC:  Normal affect    ASSESSMENT:    1. Chronic combined systolic and diastolic heart failure (HCC)   2. Hyperlipidemia LDL goal <70   3. Controlled type 2 diabetes mellitus without complication, without long-term current use of insulin (HCC)   4. Stage 3 chronic kidney disease, unspecified whether stage 3a or 3b CKD (HCC)   5. Hypotension, unspecified hypotension type    PLAN:  In order of problems listed above:  Hypotension: Blood pressure remains low today.  Will discontinue Imdur.  Chronic combined systolic and diastolic heart failure: Euvolemic on physical exam  Hyperlipidemia: On Lipitor 80 mg daily  DM2: Managed by primary care provider  CKD stage III: Stable on last lab work.        Medication Adjustments/Labs and Tests Ordered: Current medicines are reviewed at length with the patient today.  Concerns regarding medicines are outlined above.  No orders of the defined types were placed in this encounter.  No orders of the defined types were placed in this encounter.   Patient Instructions  Medication Instructions:  STOP Imdur  *If you need a refill on your cardiac medications before your next appointment, please call your pharmacy*  Lab Work: NONE ordered at this time of appointment   If you have labs (blood work) drawn today and your tests are completely normal, you will receive your results only by: MyChart Message (if you have MyChart) OR A paper copy in the mail If you have any lab test that is abnormal or we need to change your treatment, we will call you to review the results.  Testing/Procedures: NONE ordered at this time of appointment   Follow-Up: At [facility], you and your health needs are our priority.  As part of our continuing mission to provide you with exceptional heart care, we have created designated Provider Care Teams.  These Care Teams include your primary Cardiologist (physician) and Advanced Practice Providers (APPs -  Physician Assistants and  Nurse Practitioners) who all work together to provide you with the care you need, when you need it.  We recommend signing up for the patient portal called "MyChart".  Sign up information is provided on this After Visit Summary.  MyChart is used to connect with patients for Virtual Visits (Telemedicine).  Patients are able to view lab/test results, encounter notes, upcoming appointments, etc.  Non-urgent messages can be sent to your provider as well.   To learn more about what you can do with MyChart, go to [email or web address].    Your next appointment:   3 week(s)  The format for your next appointment:   In Person  Provider:   Azalee Course, PA-C  Other Instructions    Signed, Azalee Course, PA  03/15/2021 10:14 PM    [facility]

## 2021-03-15 ENCOUNTER — Encounter: Payer: Self-pay | Admitting: Physician Assistant

## 2021-03-31 ENCOUNTER — Other Ambulatory Visit: Payer: Self-pay

## 2021-03-31 ENCOUNTER — Telehealth: Payer: Self-pay

## 2021-03-31 ENCOUNTER — Encounter: Payer: Self-pay | Admitting: Physician Assistant

## 2021-03-31 ENCOUNTER — Ambulatory Visit (INDEPENDENT_AMBULATORY_CARE_PROVIDER_SITE_OTHER): Payer: Self-pay | Admitting: Physician Assistant

## 2021-03-31 ENCOUNTER — Telehealth: Payer: Self-pay | Admitting: Physician Assistant

## 2021-03-31 VITALS — BP 100/60 | HR 63 | Ht 59.0 in | Wt 126.2 lb

## 2021-03-31 DIAGNOSIS — E119 Type 2 diabetes mellitus without complications: Secondary | ICD-10-CM

## 2021-03-31 DIAGNOSIS — I2510 Atherosclerotic heart disease of native coronary artery without angina pectoris: Secondary | ICD-10-CM

## 2021-03-31 DIAGNOSIS — E785 Hyperlipidemia, unspecified: Secondary | ICD-10-CM

## 2021-03-31 DIAGNOSIS — N183 Chronic kidney disease, stage 3 unspecified: Secondary | ICD-10-CM

## 2021-03-31 DIAGNOSIS — E875 Hyperkalemia: Secondary | ICD-10-CM

## 2021-03-31 DIAGNOSIS — I5042 Chronic combined systolic (congestive) and diastolic (congestive) heart failure: Secondary | ICD-10-CM

## 2021-03-31 LAB — BASIC METABOLIC PANEL
BUN/Creatinine Ratio: 29 — ABNORMAL HIGH (ref 12–28)
BUN: 43 mg/dL — ABNORMAL HIGH (ref 8–27)
CO2: 18 mmol/L — ABNORMAL LOW (ref 20–29)
Calcium: 9.7 mg/dL (ref 8.7–10.3)
Chloride: 104 mmol/L (ref 96–106)
Creatinine, Ser: 1.46 mg/dL — ABNORMAL HIGH (ref 0.57–1.00)
Glucose: 159 mg/dL — ABNORMAL HIGH (ref 70–99)
Potassium: 5.9 mmol/L (ref 3.5–5.2)
Sodium: 136 mmol/L (ref 134–144)
eGFR: 36 mL/min/{1.73_m2} — ABNORMAL LOW (ref >59)

## 2021-03-31 NOTE — Patient Instructions (Signed)
Medication Instructions:  No medication changes. Continue your current medications.   *If you need a refill on your cardiac medications before your next appointment, please call your pharmacy*   Lab Work: Your physician recommends that you return for lab work today. BMET  If you have labs (blood work) drawn today and your tests are completely normal, you will receive your results only by: MyChart Message (if you have MyChart) OR A paper copy in the mail If you have any lab test that is abnormal or we need to change your treatment, we will call you to review the results.   Testing/Procedures: NONE ordered at this time of appointment     Follow-Up: At [facility], you and your health needs are our priority.  As part of our continuing mission to provide you with exceptional heart care, we have created designated Provider Care Teams.  These Care Teams include your primary Cardiologist (physician) and Advanced Practice Providers (APPs -  Physician Assistants and Nurse Practitioners) who all work together to provide you with the care you need, when you need it.  We recommend signing up for the patient portal called "MyChart".  Sign up information is provided on this After Visit Summary.  MyChart is used to connect with patients for Virtual Visits (Telemedicine).  Patients are able to view lab/test results, encounter notes, upcoming appointments, etc.  Non-urgent messages can be sent to your provider as well.   To learn more about what you can do with MyChart, go to [email or web address].    Your next appointment:   As Scheduled   The format for your next appointment:   In Person  Provider:   Nicki Guadalajara, MD   Other Instructions None

## 2021-03-31 NOTE — Progress Notes (Signed)
Cardiology Office Note:    Date:  04/02/2021   ID:  Sheila White, [place] [birth year], MRN [id number]  PCP:  Julieanne Manson, MD   [facility] Providers Cardiologist:  Nicki Guadalajara, MD     Referring MD: Julieanne Manson, MD   Chief Complaint  Patient presents with   Follow-up    Seen for Dr. Tresa Endo    History of Present Illness:    Sheila White is a 80 y.o. female with a hx of combined systolic and diastolic heart failure, CAD, Dressler syndrome, history of CVA, CKD stage III, HLD and DM2.  She originally presented to [facility] on 10/05/2017 with late presenting anterior STEMI.  Emergent cardiac catheterization revealed a subtotally occluded long proximal LAD of 99%, 90% mid LAD lesion, 99% apical lesion, 50% proximal OM1 lesion followed by 95% distal marginal stenosis and mild irregularities in the RCA.  She ultimately underwent 2 separate DES to proximal LAD and mid LAD, apical LAD lesion treated with PTCA.  Hospital course was complicated by postinfarction pericarditis, AKI, hyponatremia, abdominal pain, weakness and persistent cough.  Her creatinine peaked at 3.86 which improved to 1.8 at discharge.  Symptom improved with colchicine.  Follow-up echocardiogram in May 2019 showed EF 30 to 35%, mid to distal anterior, apical inferior and apical severe hypokinesis secondary to prior LAD territory infarct, grade 1 DD.  She developed some blurry vision with gait abnormality.  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  She is a Plavix nonresponder, therefore was maintained on Brilinta.  After she completed 90 mg Brilinta for 1 year, this was reduced to 60 mg maintenance therapy.  Last echocardiogram obtained in January 2021 showed EF 25 to 30%, mild LVH, grade 3 DD, restrictive physiology diastolic dysfunction, mild MR and mild TR.  She was referred to Dr. Ladona Ridgel for consideration of possible ICD, however patient decided against this.    More recently,  patient was seen by Dr. Tresa Endo on 02/20/2021, blood pressure was very low and the patient was orthostatic.  Imdur was decreased to 15 mg daily, carvedilol decreased to 3.125 mg twice a day.  She presents today for reassessment.  In early August, her blood work showed elevated potassium of 5.7.  Repeat blood work during the last office visit showed potassium has came down to 5.1.  Creatinine stable around 1.5.  I last saw the patient on 03/13/2021, I checked her blood pressure at the time, right arm blood pressure was 78/48, left arm pressure was 84/48.  Because of the large discrepancy with blood pressure obtained by CMA which was 110/60, I used automatic cuff to recheck her blood pressure again.  Right arm pressure was 91/60, left arm pressure was 101/60.  I recommended discontinuation of Imdur to help raise the blood pressure.  She returns today for reevaluation.  Since the last visit, she has been feeling very well.  She denies any dizziness, fatigue or shortness of breath.  She has no chest pain.  She is fairly independent at home according to family member.  On physical exam, she appears to be euvolemic.  Her right arm pressure was 108/56, left arm pressure was 108/62 based on manual recheck by myself.  Given her history of hyperkalemia, I will obtain a repeat basic metabolic panel today otherwise she can follow-up with Dr. Tresa Endo in November.  Past Medical History:  Diagnosis Date   Acute combined systolic and diastolic CHF, NYHA class 3 (HCC) 10/10/2017   in  setting of MI   Acute cystitis without hematuria    Acute on chronic systolic congestive heart failure (HCC) 10/20/2017   Anxiety and depression 12/19/2017   Chest pain 10/31/2017   Chest pain, rule out acute myocardial infarction 11/19/2017   Coronary artery disease 10/31/2017   Dyspnea 10/05/2017   Heart failure (HCC) 12/23/2017   HOH (hard of hearing)    Ischemic cardiomyopathy 10/05/2017   MI, acute, non ST segment elevation (HCC)    Non-insulin  dependent type 2 diabetes mellitus (HCC) 2004   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   STEMI involving left anterior descending coronary artery (HCC) 10/05/2017    Past Surgical History:  Procedure Laterality Date   CORONARY/GRAFT ACUTE MI REVASCULARIZATION N/A 10/05/2017   Procedure: Coronary/Graft Acute MI Revascularization;  Surgeon: Lennette Bihari, MD;  Location: [facility] INVASIVE CV LAB;  Service: Cardiovascular;  Laterality: N/A;   LEFT HEART CATH AND CORONARY ANGIOGRAPHY N/A 10/05/2017   Procedure: LEFT HEART CATH AND CORONARY ANGIOGRAPHY;  Surgeon: Lennette Bihari, MD;  Location: [facility] INVASIVE CV LAB;  Service: Cardiovascular;  Laterality: N/A;    Current Medications: Current Meds  Medication Sig   aspirin 81 MG chewable tablet Chew 1 tablet (81 mg total) by mouth daily.   atorvastatin (LIPITOR) 80 MG tablet Take 1 tablet by mouth once daily   BRILINTA 60 MG TABS tablet Take 1 tablet by mouth twice daily   carvedilol (COREG) 3.125 MG tablet Take 1 tablet (3.125 mg total) by mouth 2 (two) times daily.   famotidine (PEPCID) 20 MG tablet TAKE 2 TABLETS BY MOUTH AT BEDTIME   furosemide (LASIX) 20 MG tablet Take 1 tablet (20 mg total) by mouth daily as needed for fluid.   glimepiride (AMARYL) 2 MG tablet Take 1 tablet by mouth once daily with breakfast   loratadine (CLARITIN) 10 MG tablet Take 1 tablet (10 mg total) by mouth daily.   nitroGLYCERIN (NITROSTAT) 0.4 MG SL tablet Place 1 tablet (0.4 mg total) under the tongue every 5 (five) minutes x 3 doses as needed for chest pain.   Olopatadine HCl (PATADAY) 0.2 % SOLN 1 drop each eye twice daily as needed for eye allergies   sertraline (ZOLOFT) 50 MG tablet TAKE 1/2 (ONE-HALF) TABLET BY MOUTH ONCE DAILY AT BEDTIME   triamcinolone cream (KENALOG) 0.1 % Apply 1 application topically 2 (two) times daily.   [DISCONTINUED] sacubitril-valsartan (ENTRESTO) 24-26 MG Take 1 tablet by mouth 2 (two) times daily.     Allergies:   Patient has no  known allergies.   Social History   Socioeconomic History   Marital status: Widowed    Spouse name: Not on file   Number of children: 8   Years of education: 9   Highest education level: Not on file  Occupational History   Occupation: Housewife, retired  Tobacco Use   Smoking status: Never   Smokeless tobacco: Never  Vaping Use   Vaping Use: Never used  Substance and Sexual Activity   Alcohol use: Never   Drug use: Never   Sexual activity: Not Currently  Other Topics Concern   Not on file  Social History Narrative   Living with daughter, Wray Kearns and her husband and her 3 sons.   Social Determinants of Health   Financial Resource Strain: Not on file  Food Insecurity: Not on file  Transportation Needs: Not on file  Physical Activity: Not on file  Stress: Not on file  Social Connections: Not on file  Family History: The patient's family history includes Alcohol abuse in her father.  ROS:   Please see the history of present illness.     All other systems reviewed and are negative.  EKGs/Labs/Other Studies Reviewed:    The following studies were reviewed today:  Echo 07/25/2019 1. Left ventricular ejection fraction, by visual estimation, is 25 to  30%. The left ventricle has severely decreased function. There is mildly  increased left ventricular hypertrophy.   2. Definity contrast agent was given IV to delineate the left ventricular  endocardial borders.   3. Left ventricular diastolic parameters are consistent with Grade III  diastolic dysfunction (restrictive).   4. Severely dilated left ventricular internal cavity size.   5. The left ventricle demonstrates regional wall motion abnormalities.   6. Only basal function preserved mid and apical inferior, anterior,  septum and apex severely hypokinetic.   7. Global right ventricle has normal systolic function.The right  ventricular size is normal. No increase in right ventricular wall  thickness.   8. Left  atrial size was moderately dilated.   9. Right atrial size was normal.  10. Moderate calcification of the mitral valve leaflet(s).  11. Moderate mitral annular calcification.  12. Moderate thickening of the mitral valve leaflet(s).  13. The mitral valve is normal in structure. Mild mitral valve  regurgitation.  14. The tricuspid valve is normal in structure.  15. The tricuspid valve is normal in structure. Tricuspid valve  regurgitation is mild.  16. The aortic valve is tricuspid. Aortic valve regurgitation is mild.  severe sclerosis especially the non coronary cusp.  17. Pulmonic regurgitation is mild.  18. The pulmonic valve was grossly normal. Pulmonic valve regurgitation is  mild.   EKG:  EKG is not ordered today.  Recent Labs: 01/29/2021: ALT 14; Hemoglobin 10.5; Platelets 186 03/31/2021: BUN 43; Creatinine, Ser 1.46; Potassium 5.9; Sodium 136  Recent Lipid Panel    Component Value Date/Time   CHOL 132 01/29/2021 0926   TRIG 105 01/29/2021 0926   HDL 51 01/29/2021 0926   CHOLHDL 3.3 10/06/2017 0256   VLDL 22 10/06/2017 0256   LDLCALC 62 01/29/2021 0926     Risk Assessment/Calculations:           Physical Exam:    VS:  BP 100/60 (BP Location: Left Arm, Patient Position: Sitting, Cuff Size: Normal)   Pulse 63   Ht 4\' 11"  (1.499 m)   Wt 126 lb 3.2 oz (57.2 kg)   SpO2 99%   BMI 25.49 kg/m     Wt Readings from Last 3 Encounters:  03/31/21 126 lb 3.2 oz (57.2 kg)  03/13/21 123 lb 9.6 oz (56.1 kg)  02/20/21 124 lb 6.4 oz (56.4 kg)     GEN:  Well nourished, well developed in no acute distress HEENT: Normal NECK: No JVD; No carotid bruits LYMPHATICS: No lymphadenopathy CARDIAC: RRR, no murmurs, rubs, gallops RESPIRATORY:  Clear to auscultation without rales, wheezing or rhonchi  ABDOMEN: Soft, non-tender, non-distended MUSCULOSKELETAL:  No edema; No deformity  SKIN: Warm and dry NEUROLOGIC:  Alert and oriented x 3 PSYCHIATRIC:  Normal affect   ASSESSMENT:     1. Chronic combined systolic and diastolic heart failure (HCC)   2. Coronary artery disease involving native coronary artery of native heart without angina pectoris   3. Hyperlipidemia LDL goal <70   4. Controlled type 2 diabetes mellitus without complication, without long-term current use of insulin (HCC)   5. Stage 3 chronic kidney disease, unspecified  whether stage 3a or 3b CKD (HCC)   6. Hyperkalemia    PLAN:    In order of problems listed above:  Chronic combined systolic diastolic heart failure: Patient appears to be euvolemic based on physical exam today.  Continue carvedilol and Entresto.  Obtain basic metabolic panel given the recent hyperkalemia  CAD: Denies any chest pain,  Hyperlipidemia: Continue Lipitor  DM2: Managed by primary care provider  CKD stage III: Obtain basic metabolic panel  Hyperkalemia: Patient has a history of recurrent hyperkalemia.  We will obtain repeat basic metabolic panel today.     Medication Adjustments/Labs and Tests Ordered: Current medicines are reviewed at length with the patient today.  Concerns regarding medicines are outlined above.  Orders Placed This Encounter  Procedures   Basic metabolic panel   No orders of the defined types were placed in this encounter.   Patient Instructions  Medication Instructions:  No medication changes. Continue your current medications.   *If you need a refill on your cardiac medications before your next appointment, please call your pharmacy*   Lab Work: Your physician recommends that you return for lab work today. BMET  If you have labs (blood work) drawn today and your tests are completely normal, you will receive your results only by: MyChart Message (if you have MyChart) OR A paper copy in the mail If you have any lab test that is abnormal or we need to change your treatment, we will call you to review the results.   Testing/Procedures: NONE ordered at this time of appointment      Follow-Up: At [facility], you and your health needs are our priority.  As part of our continuing mission to provide you with exceptional heart care, we have created designated Provider Care Teams.  These Care Teams include your primary Cardiologist (physician) and Advanced Practice Providers (APPs -  Physician Assistants and Nurse Practitioners) who all work together to provide you with the care you need, when you need it.  We recommend signing up for the patient portal called "MyChart".  Sign up information is provided on this After Visit Summary.  MyChart is used to connect with patients for Virtual Visits (Telemedicine).  Patients are able to view lab/test results, encounter notes, upcoming appointments, etc.  Non-urgent messages can be sent to your provider as well.   To learn more about what you can do with MyChart, go to [email or web address].    Your next appointment:   As Scheduled   The format for your next appointment:   In Person  Provider:   Nicki Guadalajara, MD   Other Instructions None    Signed, Azalee Course, [place]  04/02/2021 11:22 PM    [facility]

## 2021-03-31 NOTE — Progress Notes (Signed)
Discussed with Dr. Tresa Endo, given recurrence hyperkalemia, will discontinue entresto.

## 2021-03-31 NOTE — Progress Notes (Signed)
Please contact the patient to arrange family pick up 2 doses of Lokelma for today and tomorrow. Repeat BMET this Thurs or Friday

## 2021-03-31 NOTE — Progress Notes (Signed)
See separate phone message.  

## 2021-03-31 NOTE — Telephone Encounter (Signed)
I reached out to the patient's son using translator service.  He has been informed to pick up the 2 Lokelma packets tomorrow morning.  Patient will come in for a repeat basic metabolic panel this Friday.  I did discuss the case with Dr. Tresa Endo, we decided to stop her Entresto.  Although Sherryll Burger has been helping her, however she keeps having recurrent hyperkalemia, risk outweighed the potential benefit at this time.  I discussed the above decision was the patient's son who is agreeable.  I also verified the phone number listed for her daughter is indeed correct.

## 2021-03-31 NOTE — Telephone Encounter (Addendum)
Tried calling the number listed for patient Ms. Sheila White and her daughter Sheila White and a message states that the number is no longer in service. I called and left a voice message for the [name] to have his mother give our office a call for her labs. I will try calling patient again.  ----- Message from Azalee Course, [place] sent at 03/31/2021  3:59 PM EDT ----- Please contact the patient to arrange family pick up 2 doses of Lokelma for today and tomorrow. Repeat BMET this Thurs or Friday

## 2021-03-31 NOTE — Telephone Encounter (Signed)
[facility] calling with a critical reading: Potassium 5.9

## 2021-04-01 ENCOUNTER — Other Ambulatory Visit: Payer: Self-pay

## 2021-04-01 DIAGNOSIS — Z79899 Other long term (current) drug therapy: Secondary | ICD-10-CM

## 2021-04-01 DIAGNOSIS — E875 Hyperkalemia: Secondary | ICD-10-CM

## 2021-04-02 ENCOUNTER — Encounter: Payer: Self-pay | Admitting: Physician Assistant

## 2021-04-03 ENCOUNTER — Other Ambulatory Visit: Payer: Self-pay

## 2021-04-03 DIAGNOSIS — Z79899 Other long term (current) drug therapy: Secondary | ICD-10-CM

## 2021-04-03 DIAGNOSIS — E875 Hyperkalemia: Secondary | ICD-10-CM

## 2021-04-03 LAB — BASIC METABOLIC PANEL
BUN/Creatinine Ratio: 30 — ABNORMAL HIGH (ref 12–28)
BUN: 46 mg/dL — ABNORMAL HIGH (ref 8–27)
CO2: 21 mmol/L (ref 20–29)
Calcium: 8.9 mg/dL (ref 8.7–10.3)
Chloride: 101 mmol/L (ref 96–106)
Creatinine, Ser: 1.54 mg/dL — ABNORMAL HIGH (ref 0.57–1.00)
Glucose: 122 mg/dL — ABNORMAL HIGH (ref 70–99)
Potassium: 4.7 mmol/L (ref 3.5–5.2)
Sodium: 137 mmol/L (ref 134–144)
eGFR: 34 mL/min/{1.73_m2} — ABNORMAL LOW (ref >59)

## 2021-04-09 ENCOUNTER — Telehealth: Payer: Self-pay

## 2021-04-09 NOTE — Telephone Encounter (Signed)
Pt daughter called to report inflammation near umbilical region on the left side. Has pain when applying pressure to the area. First time having this inflammation. No other SX and not taking and medications for this issue. Would like an appt or recommendation

## 2021-04-17 NOTE — Telephone Encounter (Signed)
Offered pt appts, she expressed that she no longer needs to be seen

## 2021-05-01 ENCOUNTER — Other Ambulatory Visit: Payer: Self-pay

## 2021-05-01 ENCOUNTER — Other Ambulatory Visit: Payer: Self-pay | Admitting: Internal Medicine

## 2021-05-01 ENCOUNTER — Telehealth: Payer: Self-pay

## 2021-05-01 MED ORDER — SERTRALINE HCL 50 MG PO TABS: ORAL_TABLET | ORAL | 7 refills | Status: DC

## 2021-05-01 NOTE — Telephone Encounter (Signed)
Rx sent and pt notified.

## 2021-05-01 NOTE — Telephone Encounter (Signed)
Pt would like sertraline refills. Sent to [facility] on [address]. Her last visit was 01/15/2021

## 2021-05-04 ENCOUNTER — Ambulatory Visit (INDEPENDENT_AMBULATORY_CARE_PROVIDER_SITE_OTHER): Payer: No Typology Code available for payment source | Admitting: Cardiovascular Disease

## 2021-05-04 ENCOUNTER — Other Ambulatory Visit: Payer: Self-pay

## 2021-05-04 ENCOUNTER — Encounter: Payer: Self-pay | Admitting: Cardiovascular Disease

## 2021-05-04 DIAGNOSIS — E119 Type 2 diabetes mellitus without complications: Secondary | ICD-10-CM

## 2021-05-04 DIAGNOSIS — E875 Hyperkalemia: Secondary | ICD-10-CM

## 2021-05-04 DIAGNOSIS — N1832 Chronic kidney disease, stage 3b: Secondary | ICD-10-CM

## 2021-05-04 DIAGNOSIS — I255 Ischemic cardiomyopathy: Secondary | ICD-10-CM

## 2021-05-04 DIAGNOSIS — I5042 Chronic combined systolic (congestive) and diastolic (congestive) heart failure: Secondary | ICD-10-CM

## 2021-05-04 DIAGNOSIS — E785 Hyperlipidemia, unspecified: Secondary | ICD-10-CM

## 2021-05-04 DIAGNOSIS — I2510 Atherosclerotic heart disease of native coronary artery without angina pectoris: Secondary | ICD-10-CM

## 2021-05-04 DIAGNOSIS — I2102 ST elevation (STEMI) myocardial infarction involving left anterior descending coronary artery: Secondary | ICD-10-CM

## 2021-05-04 NOTE — Progress Notes (Signed)
Cardiology Office Note    Date:  05/04/2021   ID:  [place], [place] 30-Jan-1941, MRN [id number]  PCP:  Mack Hook, MD  Cardiologist:  Shelva Majestic, MD   18 month F/u office visit .  History of Present Illness:  Sheila White is a 80 y.o. female who is originally from [name].  She presented to [facility] on October 05, 2017 with an ST segment elevation anterior myocardial infarction with late presentation.  Her symptoms had developed the day prior to admission and became more progressive with reference to shortness of breath and continued chest pain.  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  She had 50% proximal OM1 stenosis followed by 95% distal marginal stenosis and there was mild irregularity of the RCA.  She underwent successful PCI to the LAD with ultimate insertion of a 2.5 x 26 mm Resolute DES stent postdilated 2.8 proximally and 2.68 distally with a 99% stenosis reduced to 0%.  The mid LAD stenosis was treated with DES stenting with a 2 to 5 x 12 mm Resolute stent.  The apical stenosis was treated with PTCA.  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  Her peak creatinine was 3.86 which improved to 1.88 at discharge.  He was ultimately discharged on October 24, 2017 and on Oct 28, 2017 so Sheila White for her initial office evaluation.  Patient has had issues with chronic cough.  Her pericardial symptoms had improved and she was treated with colchicine.    I saw her for initial post hospital follow-up evaluation on Nov 18, 2017.  At that time she denied any recurrent chest pain symptomatology.  She was tentatively scheduled to travel to [place] several days later and I recommended that she postpone this trip.  Since I saw her, she  developed some  chest pain and was hospitalized on May 25 through Nov 22, 2017.  Troponins were normal.  Follow-up echo Doppler study showed an EF of 30 to 35%.  There was mid distal anterior, apical inferoapical severe hypokinesis secondary to her prior LAD territory infarct.  There was grade 1 diastolic dysfunction.  She also complained of some blurry vision with gait abnormality.  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  She was hyponatremic which revolved resolved.  She was seen by Sheila White post discharge on December 01, 2017.  At that time, her blood pressure was stable and pulse 77.  I saw her in June 2019.  At that time the plan was to start Plavix due to her foreign status and apparently Plavix only rather than Brilinta could be used.  However, P2Y12 test demonstrated that she was not responsive to Plavix.  As a result we have been supplying her with samples of Brilinta.   When I saw her in September 2019 her renal function was gradually improving.  She was not having any anginal symptoms and her blood pressure was stable.  I last saw her in the office in January 2020 at which time she continued to feel well.    Most recent laboratory has shown further improvement in renal function with a creatinine of 1.48 improved from 1.61 in December 2019.  She has continued to be stable.  She has been taking aspirin and Brilinta for DAPT.  She is on atorvastatin 80 mg for hyperlipidemia  with target LDL less than 70.  She is unaware of any palpitations and continues to take carvedilol 6.25 mg twice a day.  She has been taking furosemide 40 mg daily without edema.  Renal function had stabilized with creatinines in the 1.4 range.  At that time, I elected to initiate low-dose Entresto in light of her continued LV dysfunction EF at 30 to 35%.  There were no signs of overload.  I recommended follow-up laboratory 2 weeks later and reassessment with our pharmacist.  She was evaluated by Almyra Deforest, PA in a telemedicine visit in  April 2020.  She was doing well and her Lasix dose had been reduced to allow the addition of Entresto at her prior evaluation.  Since her blood pressure had been low at the pharmacy evaluation she continues to be on the 24/26 mg twice daily regimen of Entresto.  She is a Plavix nonresponder and for this reason has been maintained on Brilinta.  I saw her in July 17, 2019 for 1 year evaluation. She continued to do well.  She specifically denied chest pain or shortness of breath.  She has noticed significant benefit with the initiation of Entresto.  She had not had recent laboratory since July 2020 and at that time creatinine was 1.55.  Lipid studies revealed a cholesterol of 158, HDL 61, LDL 77 and triglycerides 100.  During that evaluation, I recommended that she undergo a follow-up echo Doppler study to reassess LV function.  She continues to be on aspirin/Brilinta and at that time per Pegasys trial data I reduced her Brilinta dose to 60 mg twice a day.  I recommended she undergo follow-up laboratory.    Her most recent echo Doppler study of July 25, 2019 showed continued reduced LV function with EF of 25 to 30%, mild LVH and grade 3, restrictive physiology diastolic dysfunction.  She had moderate dilation of her left atrium.  There was moderate mitral annular calcification with mild MR, mild TR and mild PR.  She continued to feel well and was without anginal symptoms or significant shortness of breath.  Since her prior evaluation she has been seen by several APP's and also was evaluated by Dr. Crissie Sickles for consideration of possible implantable cardioverter defibrillator.  Ultimately, the patient decided against this and has not received therapy.  I last saw her as 8 2022.  At time she denied any chest pain and felt she was doing well.   She denies significant dizziness but if she stands up quickly she does get a little lightheaded.  She continues to be on aspirin and low-dose Brilinta 60 mg twice  a day per Pegasys trial data.  She is on guideline directed medical therapy with carvedilol 6.25 mg twice a day, Entresto 24/26 mg twice a day, and continues to be on isosorbide 15 mg twice a day and 20 mg of furosemide which she states she only rarely takes.  She denies recent swelling, palpitations, chest pain, PND orthopnea.  She presents to the office today for evaluation and is here with her daughter as well as an interpreter.  During that evaluation, she had orthostatic hypotension with supine blood pressure 110/70 which decreased to 85/50 with standing.  As result, I recommended she reduce her isosorbide from 15 mg twice a day to just 50 mg daily and reduced her carvedilol from 6.25 mg twice a day down to 3.125 mg twice a day. She continued to be on DAPT with aspirin/low-dose Brilinta.  Laboratory from  August for not sent by me showed a creatinine of 1.42.  I recommended follow-up potassium level which was 5.1 with a creatinine of 1.50  Subsequently, she was seen by Desiree Hane on March 31, 2021.  Repeat laboratory at that time now showed potassium increased to 5.9.  Her Delene Loll was discontinued.  Apparently she was also taken off her low-dose isosorbide. Repeat laboratory on April 03, 2021 showed improvement in potassium to 4.7.  Creatinine was 1.54.  Recently, she feels well.  She denies any dizziness, presyncope or syncope.  She is unaware of palpitations.  She continues to be on atorvastatin 80 mg daily with most recent LDL cholesterol at 62.  She is on low-dose aspirin with Brilinta 60 mg twice a day.  She continues to be on carvedilol 3.125 mg twice a day and has a prescription for furosemide 20 mg to take as needed.  She is no longer on nitrate therapy or Entresto.  She is not short of breath and denies chest pain.  Is on glimepiride for her diabetes mellitus.  She presents for reevaluation.   Past Medical History:  Diagnosis Date   Acute combined systolic and diastolic CHF, NYHA class 3  ([place]) 10/10/2017   in setting of MI   Acute cystitis without hematuria    Acute on chronic systolic congestive heart failure ([place]) 10/20/2017   Anxiety and depression 12/19/2017   Chest pain 10/31/2017   Chest pain, rule out acute myocardial infarction 11/19/2017   Coronary artery disease 10/31/2017   Dyspnea 10/05/2017   Heart failure ([place]) 12/23/2017   HOH (hard of hearing)    Ischemic cardiomyopathy 10/05/2017   MI, acute, non ST segment elevation (HCC)    Non-insulin dependent type 2 diabetes mellitus ([place]) 2004   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   STEMI involving left anterior descending coronary artery ([place]) 10/05/2017    Past Surgical History:  Procedure Laterality Date   CORONARY/GRAFT ACUTE MI REVASCULARIZATION N/A 10/05/2017   Procedure: Coronary/Graft Acute MI Revascularization;  Surgeon: Troy Sine, MD;  Location: [facility];  Service: Cardiovascular;  Laterality: N/A;   LEFT HEART CATH AND CORONARY ANGIOGRAPHY N/A 10/05/2017   Procedure: LEFT HEART CATH AND CORONARY ANGIOGRAPHY;  Surgeon: Troy Sine, MD;  Location: [facility];  Service: Cardiovascular;  Laterality: N/A;    Current Medications: Outpatient Medications Prior to Visit  Medication Sig Dispense Refill   aspirin 81 MG chewable tablet Chew 1 tablet (81 mg total) by mouth daily. 60 tablet 2   atorvastatin (LIPITOR) 80 MG tablet Take 1 tablet by mouth once daily 30 tablet 11   BRILINTA 60 MG TABS tablet Take 1 tablet by mouth twice daily 180 tablet 3   carvedilol (COREG) 3.125 MG tablet Take 1 tablet (3.125 mg total) by mouth 2 (two) times daily. 180 tablet 3   famotidine (PEPCID) 20 MG tablet TAKE 2 TABLETS BY MOUTH AT BEDTIME 120 tablet 0   furosemide (LASIX) 20 MG tablet Take 1 tablet (20 mg total) by mouth daily as needed for fluid. 30 tablet 1   glimepiride (AMARYL) 2 MG tablet Take 1 tablet by mouth once daily with breakfast 90 tablet 3   loratadine (CLARITIN) 10 MG tablet Take  1 tablet (10 mg total) by mouth daily. 60 tablet 2   nitroGLYCERIN (NITROSTAT) 0.4 MG SL tablet Place 1 tablet (0.4 mg total) under the tongue every 5 (five) minutes x 3 doses as needed for chest pain. 25 tablet 2  Olopatadine HCl (PATADAY) 0.2 % SOLN 1 drop each eye twice daily as needed for eye allergies 2.5 mL 11   sertraline (ZOLOFT) 50 MG tablet TAKE 1/2 (ONE-HALF) TABLET BY MOUTH ONCE DAILY AT BEDTIME 15 tablet 7   triamcinolone cream (KENALOG) 0.1 % Apply 1 application topically 2 (two) times daily. 30 g 1   No facility-administered medications prior to visit.     Allergies:   Patient has no known allergies.   Social History   Socioeconomic History   Marital status: Widowed    Spouse name: Not on file   Number of children: 8   Years of education: 9   Highest education level: Not on file  Occupational History   Occupation: Housewife, retired  Tobacco Use   Smoking status: Never   Smokeless tobacco: Never  Vaping Use   Vaping Use: Never used  Substance and Sexual Activity   Alcohol use: Never   Drug use: Never   Sexual activity: Not Currently  Other Topics Concern   Not on file  Social History Narrative   Living with daughter, Sheila White and her husband and her 3 sons.   Social Determinants of Health   Financial Resource Strain: Not on file  Food Insecurity: Not on file  Transportation Needs: Not on file  Physical Activity: Not on file  Stress: Not on file  Social Connections: Not on file     Family History:  The patient's family history includes Alcohol abuse in her father.   ROS General: Negative; No fevers, chills, or night sweats;  HEENT: Negative; No changes in vision or hearing, sinus congestion, difficulty swallowing Pulmonary: Negative; No cough, wheezing, shortness of breath, hemoptysis Cardiovascular: See HPI GI: Obvious nausea, resolved Musculoskeletal: Negative; no myalgias, joint pain, or weakness Hematologic/Oncology: Negative; no easy  bruising, bleeding Endocrine: Negative; no heat/cold intolerance; no diabetes Neuro: Negative; no changes in balance, headaches; previous dizziness resolved Skin: Negative; No rashes or skin lesions Psychiatric: Negative; No behavioral problems, depression Sleep: Negative; No snoring, daytime sleepiness, hypersomnolence, bruxism, restless legs, hypnogognic hallucinations, no cataplexy Other comprehensive 14 point system review is negative.   PHYSICAL EXAM:   VS:  BP 116/62 (BP Location: Right Arm, Patient Position: Sitting, Cuff Size: Normal)   Pulse 68   Ht _0  (1.499 m)   Wt 125 lb 3.2 oz (56.8 kg)   SpO2 100%   BMI 25.29 kg/m     Repeat blood pressure by me today was 118/66 supine and 110/60 standing.   Wt Readings from Last 3 Encounters:  05/04/21 125 lb 3.2 oz (56.8 kg)  03/31/21 126 lb 3.2 oz (57.2 kg)  03/13/21 123 lb 9.6 oz (56.1 kg)    General: Alert, oriented, no distress.  Skin: normal turgor, no rashes, warm and dry HEENT: Normocephalic, atraumatic. Pupils equal round and reactive to light; sclera anicteric; extraocular muscles intact; Nose without nasal septal hypertrophy Mouth/Parynx benign; Mallinpatti scale 3 Neck: No JVD, no carotid bruits; normal carotid upstroke Lungs: clear to ausculatation and percussion; no wheezing or rales Chest wall: without tenderness to palpitation Heart: PMI not displaced, RRR, s1 s2 normal, 1/6 systolic murmur, no diastolic murmur, no rubs, gallops, thrills, or heaves Abdomen: soft, nontender; no hepatosplenomehaly, BS+; abdominal aorta nontender and not dilated by palpation. Back: no CVA tenderness Pulses 2+ Musculoskeletal: full range of motion, normal strength, no joint deformities Extremities: no clubbing cyanosis or edema, Homan's sign negative  Neurologic: grossly nonfocal; Cranial nerves grossly wnl Psychologic: Normal mood and affect  Studies/Labs Reviewed:   February 20, 2021  ECG (independently read by me): NSR at  60, LAD, QS V1-6 c/w prior anterior MI; no ectopy, normal intervals  February 2021ECG (independently read by me): Normal sinus rhythm at 75 bpm.  QS complex V1 through V5, 1 and aVL with T wave inversion V3 through V6, 1 and aVL consistent with large old anterolateral lateral myocardial infarction.  No ectopy.  QTc interval 419 ms.  January 2021 ECG (independently read by me):Normal sinus rhythm at 67 bpm.  Left anterior hemiblock.  Old anterior MI with Q waves and poor R wave progression throughout the entire precordium  July 27, 2018 ECG (independently read by me): Normal sinus rhythm at 62 bpm.  Old inferior MI and anterior MI.  Left axis deviation.  No ectopy.  QTc interval 430 ms.  March 27, 2018 ECG (independently read by me): Normal sinus rhythm at 78 bpm.  Old anterior MI with QS V1 through V5 and inferior Q waves.  No ectopy.  December 20, 2017 ECG (independently read by me): Normal sinus rhythm 70 bpm.  Old anterior MI with poor progression V1 through V5 with Q waves noted in 3 and aVF.  Normal intervals.  No ectopy.  11/18/2017 ECG (independently read by me): Sinus rhythm at 86 bpm.  Left anterior hemiblock.  QS complex V1 through V5 consistent with anterior infarct.  Inferior Q waves suggestive of inferior MI.  Normal intervals.  No ectopy.  Recent Labs: BMP Latest Ref Rng & Units 04/03/2021 03/31/2021 02/20/2021  Glucose 70 - 99 mg/dL 122(H) 159(H) 150(H)  BUN 8 - 27 mg/dL 46(H) 43(H) 46(H)  Creatinine 0.57 - 1.00 mg/dL 1.54(H) 1.46(H) 1.50(H)  BUN/Creat Ratio 12 - 28 30(H) 29(H) 31(H)  Sodium 134 - 144 mmol/L 137 136 138  Potassium 3.5 - 5.2 mmol/L 4.7 5.9(HH) 5.1  Chloride 96 - 106 mmol/L 101 104 102  CO2 20 - 29 mmol/L 21 18(L) 21  Calcium 8.7 - 10.3 mg/dL 8.9 9.7 9.1     Hepatic Function Latest Ref Rng & Units 01/29/2021 02/13/2020 08/20/2019  Total Protein 6.0 - 8.5 g/dL 6.5 6.6 6.7  Albumin 3.6 - 4.6 g/dL 3.9 3.9 4.1  AST 0 - 40 IU/L _0 ALT 0 - 32 IU/L _1 Alk Phosphatase 44 - 121 IU/L 66 313(H) 98  Total Bilirubin 0.0 - 1.2 mg/dL <0.2 0.2 <0.2  Bilirubin, Direct 0.1 - 0.5 mg/dL - - -    CBC Latest Ref Rng & Units 01/29/2021 02/13/2020 08/20/2019  WBC 3.4 - 10.8 x10E3/uL 6.5 5.4 7.2  Hemoglobin 11.1 - 15.9 g/dL 10.5(L) 10.5(L) 11.4  Hematocrit 34.0 - 46.6 % 33.9(L) 33.0(L) 35.4  Platelets 150 - 450 x10E3/uL 186 195 237   Lab Results  Component Value Date   MCV 91 01/29/2021   MCV 92 02/13/2020   MCV 88 08/20/2019   Lab Results  Component Value Date   TSH 2.617 11/20/2017   Lab Results  Component Value Date   HGBA1C 7.8 (H) 01/29/2021     BNP    Component Value Date/Time   BNP 3,381.1 (H) 06/04/2018 0754    ProBNP    Component Value Date/Time   PROBNP 5,280 (H) 08/07/2018 1247     Lipid Panel     Component Value Date/Time   CHOL 132 01/29/2021 0926   TRIG 105 01/29/2021 0926   HDL 51 01/29/2021 0926   CHOLHDL 3.3 10/06/2017 0256   VLDL  22 10/06/2017 0256   LDLCALC 62 01/29/2021 0926     RADIOLOGY: No results found.   Additional studies/ records that were reviewed today include:  I reviewed her 3-week hospitalization records in detail.  I reviewed her follow-up office visit with Sheila White.  Laboratory catheterization studies, echo Doppler assessment were reviewed.  I reviewed her hospitalization, and follow-up office visit since my last evaluation. Subsequent evaluation since my January 2020 evaluation were reviewed.   ECHO 07/25/2019 IMPRESSIONS   1. Left ventricular ejection fraction, by visual estimation, is 25 to  30%. The left ventricle has severely decreased function. There is mildly  increased left ventricular hypertrophy.   2. Definity contrast agent was given IV to delineate the left ventricular  endocardial borders.   3. Left ventricular diastolic parameters are consistent with Grade III  diastolic dysfunction (restrictive).   4. Severely dilated left ventricular internal cavity size.    5. The left ventricle demonstrates regional wall motion abnormalities.   6. Only basal function preserved mid and apical inferior, anterior,  septum and apex severely hypokinetic.   7. Global right ventricle has normal systolic function.The right  ventricular size is normal. No increase in right ventricular wall  thickness.   8. Left atrial size was moderately dilated.   9. Right atrial size was normal.  10. Moderate calcification of the mitral valve leaflet(s).  11. Moderate mitral annular calcification.  12. Moderate thickening of the mitral valve leaflet(s).  13. The mitral valve is normal in structure. Mild mitral valve  regurgitation.  14. The tricuspid valve is normal in structure.  15. The tricuspid valve is normal in structure. Tricuspid valve  regurgitation is mild.  16. The aortic valve is tricuspid. Aortic valve regurgitation is mild.  severe sclerosis especially the non coronary cusp.  17. Pulmonic regurgitation is mild.  18. The pulmonic valve was grossly normal. Pulmonic valve regurgitation is  mild.   ASSESSMENT:    1. STEMI involving left anterior descending coronary artery [facility]): Late presentation on October 05, 2017   2. Ischemic cardiomyopathy   3. Coronary artery disease involving native coronary artery of native heart without angina pectoris   4. Chronic combined systolic and diastolic heart failure (HCC)   5. Stage 3b chronic kidney disease ([place])   6. Non-insulin dependent type 2 diabetes mellitus ([place])   7. Hyperlipidemia with target LDL less than 70   8. Hyperkalemia: resolved     PLAN:   Ms. Sheila White is a very pleasant 80 year-old female who is originally from [name] and presented with a late presentation anterior ST segment elevation MI in April 2019.  She had an initial significant ischemic cardiomyopathy with an EF of 30% with grade 2 diastolic dysfunction and developed post infarct pericarditis treated with Decadron and colchicine and during her  hospitalization and developed significant hyponatremia, acute kidney injury, with ultimate improvement.  Her creatinine has improved.   She has had issues with low blood pressure and was not on an ACE or ARB therapy due to previous renal insufficiency and low blood pressure.  Remotely, she had been started on hydralazine but this had to be discontinued due to low blood pressure.  A follow-up echo showed an EF of 30 to 35% and is concordant with her late presentation anterior MI.  Renal function ultimately stabilized and since January 2020 she was started on Entresto and has been maintained on low-dose 24/26 mg twice daily.  She was on isosorbide 30 mg, furosemide  20 mg on an as-needed basis in addition to carvedilol 6.25 mg twice a day.  She has felt significantly better since initiating Entresto and remained euvolemic and has denied any recurrent exertional dyspnea.  She is a Plavix nonresponder and has been on Brilinta since her intervention.  When I saw her in January 2021 I reduced her Brilinta dose to 60 mg twice a day per Pegasys trial data.  Her echo on July 25, 2019 continued to show severe LV dysfunction with EF of 25 to 30% and restrictive physiology grade 3 diastolic dysfunction.  Laboratory from August 20, 2019 showed further improvement in creatinine at 1.36 although her potassium was elevated and I discussed avoidance of potassium containing foods.  With her continued LV dysfunction when I last saw her in February 2021 I discussed potential prophylactic ICD implantation in light of her persistent ischemic cardiomyopathy.  She ultimately was evaluated by Dr. Lovena Le but the patient decided against ICD implantation.  Her last evaluation with me in August 2022 she was mildly orthostatic and medications were further adjusted with reduction of her carvedilol to 3.125 mg twice a day and isosorbide to just 15 mg daily.  Subsequently, due to recurrent potassium elevation up to 5.9 on March 31, 2021, her  Delene Loll was discontinued.  Subsequent potassium on recheck on April 03, 2021 was improved at 4.7.  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  She also is no longer on isosorbide.  Presently she feels well.  She denies any orthostatic symptoms.  There is no dizziness with standing as she had experienced previously.  She continues to be on aggressive lipid-lowering therapy with atorvastatin 80 mg with LDL cholesterol at 62.  She has a prescription for furosemide but has not needed this.  There is no edema.  Now that she is no longer on Entresto or any ARB therapy, I am recommending she undergo a follow-up echo Doppler study in January 2023 for reassessment of LV function.  She continues to be on glimepiride 2 mg for diabetes mellitus.  I will see her in follow-up of her echo Doppler study and further recommendations will be made at that time.  Medication Adjustments/Labs and Tests Ordered: Current medicines are reviewed at length with the patient today.  Concerns regarding medicines are outlined above.  Medication changes, Labs and Tests ordered today are listed in the Patient Instructions below.    Signed, Shelva Majestic, MD  05/04/2021 6:28 PM    [facility] [address] [place], [place], [place]  [address] Phone: [phone]

## 2021-05-04 NOTE — Patient Instructions (Signed)
Medication Instructions:  Continue current medications. No changes.  *If you need a refill on your cardiac medications before your next appointment, please call your pharmacy*    Testing/Procedures: to schedule for January 2023 Your physician has requested that you have an echocardiogram. Echocardiography is a painless test that uses sound waves to create images of your heart. It provides your doctor with information about the size and shape of your heart and how well your heart's chambers and valves are working. This procedure takes approximately one hour. There are no restrictions for this procedure.     Follow-Up: At [facility], you and your health needs are our priority.  As part of our continuing mission to provide you with exceptional heart care, we have created designated Provider Care Teams.  These Care Teams include your primary Cardiologist (physician) and Advanced Practice Providers (APPs -  Physician Assistants and Nurse Practitioners) who all work together to provide you with the care you need, when you need it.  We recommend signing up for the patient portal called "MyChart".  Sign up information is provided on this After Visit Summary.  MyChart is used to connect with patients for Virtual Visits (Telemedicine).  Patients are able to view lab/test results, encounter notes, upcoming appointments, etc.  Non-urgent messages can be sent to your provider as well.   To learn more about what you can do with MyChart, go to [email or web address].    Your next appointment:   Schedule after Echo   The format for your next appointment:   In Person  Provider:   Nicki Guadalajara, MD

## 2021-05-26 ENCOUNTER — Other Ambulatory Visit: Payer: Self-pay

## 2021-05-26 DIAGNOSIS — D649 Anemia, unspecified: Secondary | ICD-10-CM

## 2021-05-26 NOTE — Progress Notes (Signed)
Patient reported that she has not been taking ferrous gluconate as recommended.

## 2021-05-27 LAB — CBC WITH DIFFERENTIAL/PLATELET
Basophils Absolute: 0 10*3/uL (ref 0.0–0.2)
Basos: 0 %
EOS (ABSOLUTE): 0.2 10*3/uL (ref 0.0–0.4)
Eos: 3 %
Hematocrit: 32.3 % — ABNORMAL LOW (ref 34.0–46.6)
Hemoglobin: 10.7 g/dL — ABNORMAL LOW (ref 11.1–15.9)
Immature Grans (Abs): 0 10*3/uL (ref 0.0–0.1)
Immature Granulocytes: 0 %
Lymphocytes Absolute: 1.6 10*3/uL (ref 0.7–3.1)
Lymphs: 24 %
MCH: 28.6 pg (ref 26.6–33.0)
MCHC: 33.1 g/dL (ref 31.5–35.7)
MCV: 86 fL (ref 79–97)
Monocytes Absolute: 0.6 10*3/uL (ref 0.1–0.9)
Monocytes: 8 %
Neutrophils Absolute: 4.5 10*3/uL (ref 1.4–7.0)
Neutrophils: 65 %
Platelets: 208 10*3/uL (ref 150–450)
RBC: 3.74 x10E6/uL — ABNORMAL LOW (ref 3.77–5.28)
RDW: 13.2 % (ref 11.7–15.4)
WBC: 6.9 10*3/uL (ref 3.4–10.8)

## 2021-06-26 ENCOUNTER — Other Ambulatory Visit: Payer: Self-pay | Admitting: Internal Medicine

## 2021-07-03 ENCOUNTER — Ambulatory Visit (HOSPITAL_COMMUNITY): Payer: Self-pay

## 2021-07-03 ENCOUNTER — Telehealth: Payer: Self-pay

## 2021-07-03 NOTE — Telephone Encounter (Signed)
Pt daughter [name] called to report that patient has been experiencing a dry cough since Tuesday 06/30/21. Has taken robitussin, tea, and loratadine, but none of those have helped. Would like appt or recommendation

## 2021-07-07 ENCOUNTER — Emergency Department (HOSPITAL_COMMUNITY)
Admission: EM | Admit: 2021-07-07 | Discharge: 2021-07-07 | Disposition: A | Discharge status: Home / Self Care | Payer: Self-pay | Attending: Emergency Medicine | Admitting: Emergency Medicine

## 2021-07-07 ENCOUNTER — Other Ambulatory Visit: Payer: Self-pay

## 2021-07-07 ENCOUNTER — Emergency Department (HOSPITAL_COMMUNITY): Payer: Self-pay

## 2021-07-07 DIAGNOSIS — I509 Heart failure, unspecified: Secondary | ICD-10-CM | POA: Insufficient documentation

## 2021-07-07 DIAGNOSIS — R42 Dizziness and giddiness: Secondary | ICD-10-CM | POA: Insufficient documentation

## 2021-07-07 DIAGNOSIS — Z7982 Long term (current) use of aspirin: Secondary | ICD-10-CM | POA: Insufficient documentation

## 2021-07-07 DIAGNOSIS — Z20822 Contact with and (suspected) exposure to covid-19: Secondary | ICD-10-CM | POA: Insufficient documentation

## 2021-07-07 DIAGNOSIS — J209 Acute bronchitis, unspecified: Secondary | ICD-10-CM | POA: Insufficient documentation

## 2021-07-07 LAB — COMPREHENSIVE METABOLIC PANEL
ALT: 10 U/L (ref 0–44)
AST: 14 U/L — ABNORMAL LOW (ref 15–41)
Albumin: 3.2 g/dL — ABNORMAL LOW (ref 3.5–5.0)
Alkaline Phosphatase: 59 U/L (ref 38–126)
Anion gap: 11 (ref 5–15)
BUN: 20 mg/dL (ref 8–23)
CO2: 22 mmol/L (ref 22–32)
Calcium: 8.9 mg/dL (ref 8.9–10.3)
Chloride: 96 mmol/L — ABNORMAL LOW (ref 98–111)
Creatinine, Ser: 1.84 mg/dL — ABNORMAL HIGH (ref 0.44–1.00)
GFR, Estimated: 27 mL/min — ABNORMAL LOW (ref >60)
Glucose, Bld: 269 mg/dL — ABNORMAL HIGH (ref 70–99)
Potassium: 4.9 mmol/L (ref 3.5–5.1)
Sodium: 129 mmol/L — ABNORMAL LOW (ref 135–145)
Total Bilirubin: 0.3 mg/dL (ref 0.3–1.2)
Total Protein: 7 g/dL (ref 6.5–8.1)

## 2021-07-07 LAB — CBC WITH DIFFERENTIAL/PLATELET
Abs Immature Granulocytes: 0.04 10*3/uL (ref 0.00–0.07)
Basophils Absolute: 0.1 10*3/uL (ref 0.0–0.1)
Basophils Relative: 1 %
Eosinophils Absolute: 0.4 10*3/uL (ref 0.0–0.5)
Eosinophils Relative: 4 %
HCT: 32.1 % — ABNORMAL LOW (ref 36.0–46.0)
Hemoglobin: 10.1 g/dL — ABNORMAL LOW (ref 12.0–15.0)
Immature Granulocytes: 0 %
Lymphocytes Relative: 18 %
Lymphs Abs: 1.6 10*3/uL (ref 0.7–4.0)
MCH: 28 pg (ref 26.0–34.0)
MCHC: 31.5 g/dL (ref 30.0–36.0)
MCV: 88.9 fL (ref 80.0–100.0)
Monocytes Absolute: 0.8 10*3/uL (ref 0.1–1.0)
Monocytes Relative: 8 %
Neutro Abs: 6.3 10*3/uL (ref 1.7–7.7)
Neutrophils Relative %: 69 %
Platelets: 272 10*3/uL (ref 150–400)
RBC: 3.61 MIL/uL — ABNORMAL LOW (ref 3.87–5.11)
RDW: 12.7 % (ref 11.5–15.5)
WBC: 9.2 10*3/uL (ref 4.0–10.5)
nRBC: 0 % (ref 0.0–0.2)

## 2021-07-07 LAB — RESP PANEL BY RT-PCR (FLU A&B, COVID) ARPGX2
Influenza A by PCR: NEGATIVE
Influenza B by PCR: NEGATIVE
SARS Coronavirus 2 by RT PCR: NEGATIVE

## 2021-07-07 MED ORDER — BENZONATATE 100 MG PO CAPS: 100.0000 mg | ORAL_CAPSULE | Freq: Three times a day (TID) | ORAL | 0 refills | Status: DC

## 2021-07-07 MED ORDER — ALBUTEROL SULFATE HFA 108 (90 BASE) MCG/ACT IN AERS: 1.0000 | INHALATION_SPRAY | Freq: Four times a day (QID) | RESPIRATORY_TRACT | 0 refills | Status: DC | PRN

## 2021-07-07 NOTE — ED Triage Notes (Signed)
Daughter / translator stated, she has had a cough since last Tuesday. It started off as a dry cough. Given Muscinx but not working

## 2021-07-07 NOTE — ED Provider Triage Note (Signed)
Emergency Medicine Provider Triage Evaluation Note  Sheila White , a 81 y.o. female  was evaluated in triage.  Pt complains of cough.  Review of Systems  Positive: cough Negative: Chest pain  Physical Exam  BP 97/81 (BP Location: Right Arm)    Pulse 81    Temp 99.1 F (37.3 C) (Oral)    Resp 16    SpO2 99%  Gen:   Awake, no distress   Resp:  Normal effort  MSK:   Moves extremities without difficulty  Other:  Ble edema, rare wheezing   Medical Decision Making  Medically screening exam initiated at 4:25 PM.  Appropriate orders placed.  Sheila White was informed that the remainder of the evaluation will be completed by another provider, this initial triage assessment does not replace that evaluation, and the importance of remaining in the ED until their evaluation is complete.     Karrie Meres, PA-C 07/07/21 1628

## 2021-07-07 NOTE — Discharge Instructions (Addendum)
Your blood work and your chest x-ray were reassuring with the exception of your kidney function being elevated.  Recommend you increase your fluid intake and follow-up with your primary care provider in the next 2 to 3 days to have repeat blood work.  As discussed take over-the-counter guaifenesin 1200 mg twice a day for cough.  I have sent in cough medicine as well that she can take as needed.  If your symptoms worsen or if you develop shortness of breath, chest pain please return to the emergency room.  Otherwise you can follow-up with your primary care provider. I did also send in an albuterol inhaler.  You can use this if you have any shortness of breath or wheezing particularly like you have been having after your coughing spells.

## 2021-07-07 NOTE — ED Provider Notes (Signed)
[facility]   CSN: [id number] Arrival date & time: 07/07/21  1047     History  Chief Complaint  Patient presents with   Cough    Sheila White is a 81 y.o. female.  81 year old female presents today for evaluation of 1 week duration of productive cough without associated fever, chills, shortness of breath, lightheadedness, or chest pain.  She does report occasional shortness of breath with significant coughing spells but otherwise without dyspnea on exertion.  She is able to go about her activities of daily living without difficulties.  Denies recent sick exposure.  Patient is accompanied by daughter who is her interpreter.  The history is provided by the patient and a relative.      Home Medications Prior to Admission medications   Medication Sig Start Date End Date Taking? Authorizing Provider  aspirin 81 MG chewable tablet Chew 1 tablet (81 mg total) by mouth daily. 10/25/17   Filbert SchilderMcDaniel, Jill D, NP  atorvastatin (LIPITOR) 80 MG tablet Take 1 tablet by mouth once daily 06/27/21   Julieanne MansonMulberry, Elizabeth, MD  BRILINTA 60 MG TABS tablet Take 1 tablet by mouth twice daily 07/17/20   Lennette BihariKelly, Thomas A, MD  carvedilol (COREG) 3.125 MG tablet Take 1 tablet (3.125 mg total) by mouth 2 (two) times daily. 02/20/21 05/21/21  Lennette BihariKelly, Thomas A, MD  famotidine (PEPCID) 20 MG tablet TAKE 2 TABLETS BY MOUTH AT BEDTIME 04/11/19   Julieanne MansonMulberry, Elizabeth, MD  furosemide (LASIX) 20 MG tablet Take 1 tablet (20 mg total) by mouth daily as needed for fluid. 08/10/18   Lennette BihariKelly, Thomas A, MD  glimepiride (AMARYL) 2 MG tablet Take 1 tablet by mouth once daily with breakfast 07/19/20   Julieanne MansonMulberry, Elizabeth, MD  loratadine (CLARITIN) 10 MG tablet Take 1 tablet (10 mg total) by mouth daily. 10/25/17   Filbert SchilderMcDaniel, Jill D, NP  nitroGLYCERIN (NITROSTAT) 0.4 MG SL tablet Place 1 tablet (0.4 mg total) under the tongue every 5 (five) minutes x 3 doses as needed for chest  pain. 08/23/18   Julieanne MansonMulberry, Elizabeth, MD  Olopatadine HCl (PATADAY) 0.2 % SOLN 1 drop each eye twice daily as needed for eye allergies 02/13/20   Julieanne MansonMulberry, Elizabeth, MD  sertraline (ZOLOFT) 50 MG tablet TAKE 1/2 (ONE-HALF) TABLET BY MOUTH ONCE DAILY AT BEDTIME 05/01/21   Julieanne MansonMulberry, Elizabeth, MD  triamcinolone cream (KENALOG) 0.1 % Apply 1 application topically 2 (two) times daily. 08/23/18   Julieanne MansonMulberry, Elizabeth, MD      Allergies    Patient has no known allergies.    Review of Systems   Review of Systems  Constitutional:  Negative for activity change, chills and fever.  HENT:  Negative for congestion.   Respiratory:  Positive for cough and shortness of breath (Mild shortness of breath only with significant coughing spells otherwise without shortness of breath.). Negative for wheezing.   Cardiovascular:  Negative for chest pain.  Gastrointestinal:  Negative for abdominal pain.  Neurological:  Negative for weakness and light-headedness.  All other systems reviewed and are negative.  Physical Exam Updated Vital Signs BP (!) 147/75    Pulse 79    Temp 99.7 F (37.6 C) (Oral)    Resp (!) 22    SpO2 100%  Physical Exam Vitals and nursing note reviewed.  Constitutional:      General: She is not in acute distress.    Appearance: Normal appearance. She is not ill-appearing.  HENT:     Head:  Normocephalic and atraumatic.     Nose: Nose normal.  Eyes:     General: No scleral icterus.    Extraocular Movements: Extraocular movements intact.     Conjunctiva/sclera: Conjunctivae normal.  Cardiovascular:     Rate and Rhythm: Normal rate and regular rhythm.     Pulses: Normal pulses.     Heart sounds: Normal heart sounds.  Pulmonary:     Effort: Pulmonary effort is normal. No respiratory distress.     Breath sounds: Normal breath sounds. No wheezing or rales.  Abdominal:     General: There is no distension.     Tenderness: There is no abdominal tenderness.  Musculoskeletal:        General:  Normal range of motion.     Cervical back: Normal range of motion.     Right lower leg: No edema.     Left lower leg: No edema.  Skin:    General: Skin is warm and dry.  Neurological:     General: No focal deficit present.     Mental Status: She is alert. Mental status is at baseline.    ED Results / Procedures / Treatments   Labs (all labs ordered are listed, but only abnormal results are displayed) Labs Reviewed  CBC WITH DIFFERENTIAL/PLATELET - Abnormal; Notable for the following components:      Result Value   RBC 3.61 (*)    Hemoglobin 10.1 (*)    HCT 32.1 (*)    All other components within normal limits  COMPREHENSIVE METABOLIC PANEL - Abnormal; Notable for the following components:   Sodium 129 (*)    Chloride 96 (*)    Glucose, Bld 269 (*)    Creatinine, Ser 1.84 (*)    Albumin 3.2 (*)    AST 14 (*)    GFR, Estimated 27 (*)    All other components within normal limits  RESP PANEL BY RT-PCR (FLU A&B, COVID) [id number]    EKG None  Radiology DG Chest 2 View  Result Date: 07/07/2021 CLINICAL DATA:  Cough for several days, history coronary artery disease post MI, diabetes mellitus, CHF, ischemic cardiomyopathy, hypertension EXAM: CHEST - 2 VIEW COMPARISON:  05/31/2018 FINDINGS: Enlargement of cardiac silhouette. Calcified tortuous aorta. Mediastinal contours and pulmonary vascularity normal. Chronic accentuation of interstitial markings at the bases, stable. No acute infiltrate, pleural effusion, or pneumothorax. Bones demineralized. IMPRESSION: Enlargement of cardiac silhouette. No acute abnormalities. Aortic Atherosclerosis (ICD10-I70.0). Electronically Signed   By: Ulyses Southward M.D.   On: 07/07/2021 11:59    Procedures Procedures    Medications Ordered in ED Medications - No data to display  ED Course/ Medical Decision Making/ A&P                           Medical Decision Making 81 year old female presents today for evaluation of 1 week duration of cough without  associated fever, chills, shortness of breath, chest pain, lightheadedness.  Initial work-up significant for renal insufficiency with creatinine of 1.8 baseline around 1.5.  Patient also has history of CHF so will defer IV hydration.  Patient able to drink fluids and will increase p.o. fluid intake at home.  Discussed importance of follow-up with primary care provider to have repeat renal function in the next 2 to 3 days.  Symptomatic treatment discussed.  Return precautions discussed.  Patient and daughter both voiced understanding and are in agreement with plan.  Patient did ambulate around the emergency department  without difficulty and without O2 sats dropping below 98%.  Chest x-ray without signs concerning for pneumonia.  CBC without leukocytosis.  Final Clinical Impression(s) / ED Diagnoses Final diagnoses:  Acute bronchitis, unspecified organism    Rx / DC Orders ED Discharge Orders          Ordered    benzonatate (TESSALON) 100 MG capsule  Every 8 hours        07/07/21 2209    albuterol (VENTOLIN HFA) 108 (90 Base) MCG/ACT inhaler  Every 6 hours PRN        07/07/21 2209              Marita Kansas, PA-C 07/07/21 2210    Benjiman Core, MD 07/08/21 [id number]

## 2021-07-10 ENCOUNTER — Other Ambulatory Visit (HOSPITAL_COMMUNITY): Payer: Self-pay

## 2021-07-14 ENCOUNTER — Encounter (HOSPITAL_COMMUNITY): Payer: Self-pay | Admitting: Cardiovascular Disease

## 2021-07-14 NOTE — Telephone Encounter (Signed)
Pt visited ED

## 2021-07-22 ENCOUNTER — Ambulatory Visit: Payer: Self-pay | Admitting: Internal Medicine

## 2021-07-23 ENCOUNTER — Other Ambulatory Visit: Payer: Self-pay

## 2021-07-23 ENCOUNTER — Ambulatory Visit: Payer: Self-pay | Admitting: Internal Medicine

## 2021-07-23 ENCOUNTER — Encounter: Payer: Self-pay | Admitting: Internal Medicine

## 2021-07-23 VITALS — BP 150/78 | HR 68 | Resp 16 | Ht 59.0 in | Wt 122.5 lb

## 2021-07-23 DIAGNOSIS — E119 Type 2 diabetes mellitus without complications: Secondary | ICD-10-CM

## 2021-07-23 DIAGNOSIS — I2510 Atherosclerotic heart disease of native coronary artery without angina pectoris: Secondary | ICD-10-CM

## 2021-07-23 DIAGNOSIS — Z23 Encounter for immunization: Secondary | ICD-10-CM

## 2021-07-23 DIAGNOSIS — E785 Hyperlipidemia, unspecified: Secondary | ICD-10-CM

## 2021-07-23 DIAGNOSIS — I10 Essential (primary) hypertension: Secondary | ICD-10-CM

## 2021-07-23 DIAGNOSIS — Z9861 Coronary angioplasty status: Secondary | ICD-10-CM

## 2021-07-23 DIAGNOSIS — I255 Ischemic cardiomyopathy: Secondary | ICD-10-CM

## 2021-07-23 DIAGNOSIS — E871 Hypo-osmolality and hyponatremia: Secondary | ICD-10-CM

## 2021-07-23 NOTE — Progress Notes (Signed)
Subjective:    Patient ID: Sheila White, female   DOB: 01-04-1941, 81 y.o.   MRN: [id number]   HPI  Daughter, [name], Interprets   Episode of what sounds like URI, for which she was treated with Tessalon perles, and for some reason, Albuterol HFA.  Has not used the inhaler for about 1 week now.  Rare cough at this point.  2.   CAD/CHF, compensated:  No chest pain.  No LE edema.  No PND or orthopnea.    3.  DM:  A1C was still above goal at 7.8% in August.  Blood sugar was 269 on the 10th when being seen in ED for cough.  Not clear how well she is doing with diet.  Not very physically active.    4.  HM:  has not had bivalent COVID vaccine.  Did not come in for influenza vaccine clinic--had to cancel at last minute as no transport.  Has not had Pneumococcal 13 or Shingles vaccine as well.   Current Meds  Medication Sig   albuterol (VENTOLIN HFA) 108 (90 Base) MCG/ACT inhaler Inhale 1-2 puffs into the lungs every 6 (six) hours as needed for wheezing or shortness of breath.   aspirin 81 MG chewable tablet Chew 1 tablet (81 mg total) by mouth daily.   atorvastatin (LIPITOR) 80 MG tablet Take 1 tablet by mouth once daily   BRILINTA 60 MG TABS tablet Take 1 tablet by mouth twice daily   carvedilol (COREG) 3.125 MG tablet Take 1 tablet (3.125 mg total) by mouth 2 (two) times daily.   famotidine (PEPCID) 20 MG tablet TAKE 2 TABLETS BY MOUTH AT BEDTIME   furosemide (LASIX) 20 MG tablet Take 1 tablet (20 mg total) by mouth daily as needed for fluid.   glimepiride (AMARYL) 2 MG tablet Take 1 tablet by mouth once daily with breakfast   loratadine (CLARITIN) 10 MG tablet Take 1 tablet (10 mg total) by mouth daily.   nitroGLYCERIN (NITROSTAT) 0.4 MG SL tablet Place 1 tablet (0.4 mg total) under the tongue every 5 (five) minutes x 3 doses as needed for chest pain.   Olopatadine HCl (PATADAY) 0.2 % SOLN 1 drop each eye twice daily as needed for eye allergies   sertraline (ZOLOFT) 50 MG  tablet TAKE 1/2 (ONE-HALF) TABLET BY MOUTH ONCE DAILY AT BEDTIME   No Known Allergies   Review of Systems    Objective:   BP (!) 150/78 (BP Location: Right Arm, Patient Position: Sitting, Cuff Size: Normal)   Pulse 68   Resp 16   Ht 4\' 11"  (1.499 m)   Wt 122 lb 8 oz (55.6 kg)   BMI 24.74 kg/m   Physical Exam NAD HEENT:  PERRL, EOMI, TMs pearly gray, throat without injection.  Occasional cough Neck:  Supple, No adenopathy Chest:  dry crackles at bases that clear with cough. CV:  RRR without murmur or rub.  Radial pulses normal and equal.  No JVD.  No LE edema. Abd:  S, NT, No HSM or mass..   Assessment & Plan    Hypertension:  BP up today.  Has had entresto, hydralazine, Isosorbide, and Carvedilol stopped or decreased due to hyperkalemia or low BP, but today BP too high.  Will have her return for another BP check in 1-2 weeks, and if still high, consider adding back hydralazine low dose.  HR limits increase of Carvedilol.  BP was excellent on 05/04/21 with Dr. 13/7/22, however.  2.  DM:  A1C with goal of less than 7.0%.  referral for DM eye exam.  3.  CKD/hyponatremia:  BMP  4.  HM:  Pneumococcal 13v today.  Would like to wait until back for BP check for Moderna bivalent booster and Shingrix.  5.  CAD/Cardiomyopathy:  compensated and stable.

## 2021-07-24 LAB — BASIC METABOLIC PANEL
BUN/Creatinine Ratio: 26 (ref 12–28)
BUN: 31 mg/dL — ABNORMAL HIGH (ref 8–27)
CO2: 20 mmol/L (ref 20–29)
Calcium: 8.9 mg/dL (ref 8.7–10.3)
Chloride: 97 mmol/L (ref 96–106)
Creatinine, Ser: 1.21 mg/dL — ABNORMAL HIGH (ref 0.57–1.00)
Glucose: 182 mg/dL — ABNORMAL HIGH (ref 70–99)
Potassium: 5 mmol/L (ref 3.5–5.2)
Sodium: 131 mmol/L — ABNORMAL LOW (ref 134–144)
eGFR: 44 mL/min/{1.73_m2} — ABNORMAL LOW (ref >59)

## 2021-07-24 LAB — HGB A1C W/O EAG: Hgb A1c MFr Bld: 8.5 % — ABNORMAL HIGH (ref 4.8–5.6)

## 2021-08-06 ENCOUNTER — Other Ambulatory Visit: Payer: Self-pay | Admitting: Cardiovascular Disease

## 2021-08-07 ENCOUNTER — Other Ambulatory Visit: Payer: Self-pay

## 2021-08-07 ENCOUNTER — Ambulatory Visit: Payer: Self-pay | Admitting: Internal Medicine

## 2021-08-07 VITALS — BP 140/68 | HR 66

## 2021-08-07 DIAGNOSIS — Z0130 Encounter for examination of blood pressure without abnormal findings: Secondary | ICD-10-CM

## 2021-08-07 DIAGNOSIS — Z23 Encounter for immunization: Secondary | ICD-10-CM

## 2021-08-07 NOTE — Progress Notes (Signed)
Patient reported that she has been taking medication consistently and did take medication this morning.  After speaking with Dr Amil Amen, Patient will return for bp check in about 2 weeks. No changes to medication.

## 2021-08-24 ENCOUNTER — Ambulatory Visit (HOSPITAL_COMMUNITY): Payer: Self-pay | Attending: Cardiology

## 2021-08-24 ENCOUNTER — Other Ambulatory Visit: Payer: Self-pay

## 2021-08-24 DIAGNOSIS — I255 Ischemic cardiomyopathy: Secondary | ICD-10-CM

## 2021-08-24 LAB — ECHOCARDIOGRAM COMPLETE
AR max vel: 3.1 cm2
AV Area VTI: 3.08 cm2
AV Area mean vel: 2.97 cm2
AV Mean grad: 4 mmHg
AV Peak grad: 6.9 mmHg
Ao pk vel: 1.32 m/s
Area-P 1/2: 2.8 cm2
S' Lateral: 2.5 cm

## 2021-09-21 ENCOUNTER — Encounter: Payer: Self-pay | Admitting: Cardiovascular Disease

## 2021-09-21 ENCOUNTER — Other Ambulatory Visit: Payer: Self-pay

## 2021-09-21 ENCOUNTER — Ambulatory Visit (INDEPENDENT_AMBULATORY_CARE_PROVIDER_SITE_OTHER): Payer: Self-pay | Admitting: Cardiovascular Disease

## 2021-09-21 DIAGNOSIS — N1832 Chronic kidney disease, stage 3b: Secondary | ICD-10-CM

## 2021-09-21 DIAGNOSIS — I2510 Atherosclerotic heart disease of native coronary artery without angina pectoris: Secondary | ICD-10-CM

## 2021-09-21 DIAGNOSIS — I2102 ST elevation (STEMI) myocardial infarction involving left anterior descending coronary artery: Secondary | ICD-10-CM

## 2021-09-21 DIAGNOSIS — I255 Ischemic cardiomyopathy: Secondary | ICD-10-CM

## 2021-09-21 DIAGNOSIS — E785 Hyperlipidemia, unspecified: Secondary | ICD-10-CM

## 2021-09-21 NOTE — Patient Instructions (Signed)
Medication Instructions:  No changes.   *If you need a refill on your cardiac medications before your next appointment, please call your pharmacy*   Lab Work: None ordered.   If you have labs (blood work) drawn today and your tests are completely normal, you will receive your results only by: . MyChart Message (if you have MyChart) OR . A paper copy in the mail If you have any lab test that is abnormal or we need to change your treatment, we will call you to review the results.   Testing/Procedures: None ordered  Follow-Up: At [facility], you and your health needs are our priority.  As part of our continuing mission to provide you with exceptional heart care, we have created designated Provider Care Teams.  These Care Teams include your primary Cardiologist (physician) and Advanced Practice Providers (APPs -  Physician Assistants and Nurse Practitioners) who all work together to provide you with the care you need, when you need it.  We recommend signing up for the patient portal called "MyChart".  Sign up information is provided on this After Visit Summary.  MyChart is used to connect with patients for Virtual Visits (Telemedicine).  Patients are able to view lab/test results, encounter notes, upcoming appointments, etc.  Non-urgent messages can be sent to your provider as well.   To learn more about what you can do with MyChart, go to [email or web address].    Your next appointment:   6 month(s)  The format for your next appointment:   In Person  Provider:   Thomas Kelly, MD    

## 2021-09-21 NOTE — Progress Notes (Signed)
? ?Cardiology Office Note   ? ?Date:  09/25/2021  ? ?ID:  Damian Leavell, DOB Aug 04, 1940, MRN [id number] ? ?PCP:  Mack Hook, MD  ?Cardiologist:  Shelva Majestic, MD  ? ?5 month F/u office visit . ? ?History of Present Illness:  ?Samona Crowell is a 81 y.o. female who is originally from [name].  She presented to [facility] on October 05, 2017 with an ST segment elevation anterior myocardial infarction with late presentation.  Her symptoms had developed the day prior to admission and became more progressive with reference to shortness of breath and continued chest pain.  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  She had 50% proximal OM1 stenosis followed by 95% distal marginal stenosis and there was mild irregularity of the RCA.  She underwent successful PCI to the LAD with ultimate insertion of a 2.5 x 26 mm Resolute DES stent postdilated 2.8 proximally and 2.68 distally with a 99% stenosis reduced to 0%.  The mid LAD stenosis was treated with DES stenting with a 2 to 5 x 12 mm Resolute stent.  The apical stenosis was treated with PTCA.  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  Her peak creatinine was 3.86 which improved to 1.88 at discharge.  He was ultimately discharged on October 24, 2017 and on Oct 28, 2017 so [name] for her initial office evaluation.  Patient has had issues with chronic cough.  Her pericardial symptoms had improved and she was treated with colchicine.   ? ?I saw her for initial post hospital follow-up evaluation on Nov 18, 2017.  At that time she denied any recurrent chest pain symptomatology.  She was tentatively scheduled to travel to [place] several days later and I recommended that she postpone this trip.  Since I saw her, she  developed some  chest pain and was hospitalized on May 25 through Nov 22, 2017.  Troponins were normal.  Follow-up echo Doppler study showed an EF of 30 to 35%.  There was mid distal anterior, apical inferoapical severe hypokinesis secondary to her prior LAD territory infarct.  There was grade 1 diastolic dysfunction.  She also complained of some blurry vision with gait abnormality.  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  She was hyponatremic which revolved resolved.  She was seen by [name] post discharge on December 01, 2017.  At that time, her blood pressure was stable and pulse 77. ? ?I saw her in June 2019.  At that time the plan was to start Plavix due to her foreign status and apparently Plavix only rather than Brilinta could be used.  However, P2Y12 test demonstrated that she was not responsive to Plavix.  As a result we have been supplying her with samples of Brilinta.  ? ?When I saw her in September 2019 her renal function was gradually improving.  She was not having any anginal symptoms and her blood pressure was stable. ? I last saw her in the office in January 2020 at which time she continued to feel well.    Most recent laboratory has shown further improvement in renal function with a creatinine of 1.48 improved from 1.61 in December 2019.  She has continued to be stable.  She has been taking aspirin and Brilinta for DAPT.  She is on atorvastatin 80 mg for hyperlipidemia  with target LDL less than 70.  She is unaware of any palpitations and continues to take carvedilol 6.25 mg twice a day.  She has been taking furosemide 40 mg daily without edema.  Renal function had stabilized with creatinines in the 1.4 range.  At that time, I elected to initiate low-dose Entresto in light of her continued LV dysfunction EF at 30 to 35%.  There were no signs of overload.  I recommended follow-up laboratory 2 weeks later and reassessment with our pharmacist. ? ?She was evaluated by Almyra Deforest, PA in a telemedicine visit in  April 2020.  She was doing well and her Lasix dose had been reduced to allow the addition of Entresto at her prior evaluation.  Since her blood pressure had been low at the pharmacy evaluation she continues to be on the 24/26 mg twice daily regimen of Entresto.  She is a Plavix nonresponder and for this reason has been maintained on Brilinta. ? ?I saw her in July 17, 2019 for 1 year evaluation. She continued to do well.  She specifically denied chest pain or shortness of breath.  She has noticed significant benefit with the initiation of Entresto.  She had not had recent laboratory since July 2020 and at that time creatinine was 1.55.  Lipid studies revealed a cholesterol of 158, HDL 61, LDL 77 and triglycerides 100.  During that evaluation, I recommended that she undergo a follow-up echo Doppler study to reassess LV function.  She continues to be on aspirin/Brilinta and at that time per Pegasys trial data I reduced her Brilinta dose to 60 mg twice a day.  I recommended she undergo follow-up laboratory.   ? ?Her most recent echo Doppler study of July 25, 2019 showed continued reduced LV function with EF of 25 to 30%, mild LVH and grade 3, restrictive physiology diastolic dysfunction.  She had moderate dilation of her left atrium.  There was moderate mitral annular calcification with mild MR, mild TR and mild PR.  She continued to feel well and was without anginal symptoms or significant shortness of breath. ? ?Since her prior evaluation she has been seen by several APP's and also was evaluated by Dr. Crissie Sickles for consideration of possible implantable cardioverter defibrillator.  Ultimately, the patient decided against this and has not received therapy. ? ?I last saw her as 13 2022.  At time she denied any chest pain and felt she was doing well.   She denies significant dizziness but if she stands up quickly she does get a little lightheaded.  She continues to be on aspirin and low-dose Brilinta 60 mg twice  a day per Pegasys trial data.  She is on guideline directed medical therapy with carvedilol 6.25 mg twice a day, Entresto 24/26 mg twice a day, and continues to be on isosorbide 15 mg twice a day and 20 mg of furosemide which she states she only rarely takes.  She denies recent swelling, palpitations, chest pain, PND orthopnea.  She presents to the office today for evaluation and is here with her daughter as well as an interpreter.  During that evaluation, she had orthostatic hypotension with supine blood pressure 110/70 which decreased to 85/50 with standing.  As result, I recommended she reduce her isosorbide from 15 mg twice a day to just 50 mg daily and reduced her carvedilol from 6.25 mg twice a day down to 3.125 mg twice a day. She continued to be on DAPT with aspirin/low-dose Brilinta.  Laboratory from  August for not sent by me showed a creatinine of 1.42.  I recommended follow-up potassium level which was 5.1 with a creatinine of 1.50 ? ?she was seen by Desiree Hane on March 31, 2021.  Repeat laboratory at that time now showed potassium increased to 5.9.  Her Delene Loll was discontinued.  Apparently she was also taken off her low-dose isosorbide. Repeat laboratory on April 03, 2021 showed improvement in potassium to 4.7.  Creatinine was 1.54. ? ?I last saw her in May 04, 2021 at which time she felt well and denied any chest pain, shortness of breath, dizziness, presyncope or syncope.  She was unaware of palpitations..  She continues to be on atorvastatin 80 mg daily with most recent LDL cholesterol at 62.  She is on low-dose aspirin with Brilinta 60 mg twice a day.  She continues to be on carvedilol 3.125 mg twice a day and has a prescription for furosemide 20 mg to take as needed.  She is no longer on nitrate therapy or Entresto.  She is on on glimepiride for her diabetes mellitus.  During that evaluation, she was euvolemic and was not needing furosemide.  I recommended a follow-up echo Doppler study for  reassessment of LV function. ? ?She underwent her echo Doppler study on August 24, 2021.  LV function remained moderate to severely depressed with EF estimated at 30 to 35% with wall motion abnormalit

## 2021-09-25 ENCOUNTER — Encounter: Payer: Self-pay | Admitting: Cardiovascular Disease

## 2021-09-29 ENCOUNTER — Other Ambulatory Visit: Payer: Self-pay | Admitting: Internal Medicine

## 2021-10-29 ENCOUNTER — Telehealth: Payer: Self-pay

## 2021-10-29 NOTE — Telephone Encounter (Signed)
Pt's daughter walked in to the office today asking about pt assistance paperwork that she brought sometime in Feb for Brilinta, upon review of medical record I do not see evidence of this. Daughter also requests samples. ? ?Medication samples have been provided to the patient. ? ?Drug name: Brilinta 60mg  Qty: 2 boxes LOT: [id number] Exp.Date: 11/26/23 ? ?Samples provided to daughter. Will have primary nurse look into paperwork. Daughter also told that a second copy of paperwork may be helpful.  ? ? ?

## 2021-10-29 NOTE — Telephone Encounter (Signed)
I had Dr.Kelly sign the patient assistance forms today- they were faxed over and confirmation received fax was successful.  ?I contact daughter, and advised of this and I would keep them updated when I received a response.  ? ?Daughter verbalized understanding.  ? ?

## 2021-11-02 ENCOUNTER — Other Ambulatory Visit: Payer: Self-pay

## 2021-11-11 ENCOUNTER — Telehealth: Payer: Self-pay | Admitting: Cardiovascular Disease

## 2021-11-11 NOTE — Telephone Encounter (Signed)
Received information that patient was approved for patient assistance- I attempted to call daughter and make her aware, they will be sending it out medication but was unable to reach her and unable to leave a message.  ?Medication approval will be scanned into chart.  ? ? ?

## 2021-11-11 NOTE — Telephone Encounter (Signed)
Pt's daughter states that she had a missed call and is unsure as to who called and why.Please advise ?

## 2021-11-11 NOTE — Telephone Encounter (Signed)
See 10-29-21 telephone message. Relayed that message. [name] was thankful for the message. No further questions ?

## 2021-12-31 ENCOUNTER — Other Ambulatory Visit: Payer: Self-pay | Admitting: Internal Medicine

## 2022-03-10 ENCOUNTER — Telehealth: Payer: Self-pay

## 2022-03-10 NOTE — Telephone Encounter (Signed)
   Patient daughter called to request an appointment. Patient has been complaining of fatigue for the past 2 weeks. She is sleeping well during the night, but still feels tired and sleepy throughout the day. Patient has not taken any medication to help with this issue

## 2022-03-17 NOTE — Telephone Encounter (Signed)
Called to offer an appointment, unable to reach patient

## 2022-03-22 NOTE — Telephone Encounter (Signed)
Patient has been scheduled

## 2022-03-23 ENCOUNTER — Ambulatory Visit: Payer: Self-pay | Admitting: Internal Medicine

## 2022-03-23 ENCOUNTER — Encounter: Payer: Self-pay | Admitting: Internal Medicine

## 2022-03-23 VITALS — BP 118/64 | HR 68 | Resp 12 | Ht 59.0 in | Wt 116.0 lb

## 2022-03-23 DIAGNOSIS — I255 Ischemic cardiomyopathy: Secondary | ICD-10-CM

## 2022-03-23 DIAGNOSIS — Z604 Social exclusion and rejection: Secondary | ICD-10-CM

## 2022-03-23 DIAGNOSIS — E785 Hyperlipidemia, unspecified: Secondary | ICD-10-CM

## 2022-03-23 DIAGNOSIS — G4719 Other hypersomnia: Secondary | ICD-10-CM

## 2022-03-23 DIAGNOSIS — E119 Type 2 diabetes mellitus without complications: Secondary | ICD-10-CM

## 2022-03-23 DIAGNOSIS — I10 Essential (primary) hypertension: Secondary | ICD-10-CM

## 2022-03-23 DIAGNOSIS — R5383 Other fatigue: Secondary | ICD-10-CM

## 2022-03-23 MED ORDER — NITROFURANTOIN MONOHYD MACRO 100 MG PO CAPS: ORAL_CAPSULE | ORAL | 0 refills | Status: DC

## 2022-03-23 NOTE — Patient Instructions (Signed)
Influenza and covid vaccines

## 2022-03-23 NOTE — Progress Notes (Signed)
Subjective:    Patient ID: Sheila White, female   DOB: [birth year], 81 y.o.   MRN: [id number]   HPI  Daughter interprets   Sleepiness:  Sleeps soundly all night without snoring, then falls asleep easily during the day.  Daughter states has been going on for a month.   She is concerned something could be wrong again with her sodium as she feels when she had similar symptoms in past, her sodium was low.   She has not required furosemide in some time as not having swelling of ankles.  Daughter denies any confusion.   Patient states she feels well rested in the morning on awakening.  For some reason, whoever, just feels sleepy throughout the day and easily falls asleep.   Denies fatigue--just a sense of sleepiness.  Later, reverses this and does seem to feel fatigue. Used to be able to drink a bit of coffee and would help, but no longer does.   No melena or hematochezia. No abdominal pain. May have mild discomfort with urination at times.     Daughter states she has had some cloudy urine since around 03/10/2022.  Has noted it at least 4 days ago again.  Daughter states her urine also has an odor.   Denies vaginal discharge.   No fever.   No constipation or diarrhea.    Generally, just inside the home with very limited stimulation.  When lived [place] went back and forth with neighbors.    Current Meds  Medication Sig   albuterol (VENTOLIN HFA) 108 (90 Base) MCG/ACT inhaler Inhale 1-2 puffs into the lungs every 6 (six) hours as needed for wheezing or shortness of breath.   aspirin 81 MG chewable tablet Chew 1 tablet (81 mg total) by mouth daily.   atorvastatin (LIPITOR) 80 MG tablet Take 1 tablet by mouth once daily   carvedilol (COREG) 3.125 MG tablet Take 1 tablet (3.125 mg total) by mouth 2 (two) times daily.   famotidine (PEPCID) 20 MG tablet TAKE 2 TABLETS BY MOUTH AT BEDTIME   furosemide (LASIX) 20 MG tablet Take 1 tablet (20 mg total) by mouth daily as needed  for fluid.   glimepiride (AMARYL) 2 MG tablet Take 1 tablet by mouth once daily with breakfast   loratadine (CLARITIN) 10 MG tablet Take 1 tablet (10 mg total) by mouth daily.   nitroGLYCERIN (NITROSTAT) 0.4 MG SL tablet Place 1 tablet (0.4 mg total) under the tongue every 5 (five) minutes x 3 doses as needed for chest pain.   Olopatadine HCl (PATADAY) 0.2 % SOLN 1 drop each eye twice daily as needed for eye allergies   sertraline (ZOLOFT) 50 MG tablet TAKE 1/2 (ONE-HALF) TABLET BY MOUTH ONCE DAILY AT BEDTIME   ticagrelor (BRILINTA) 60 MG TABS tablet Take 1 tablet by mouth twice daily   No Known Allergies   Review of Systems    Objective:   BP 118/64 (BP Location: Left Arm, Patient Position: Sitting, Cuff Size: Normal)   Pulse 68   Resp 12   Ht 4\' 11"  (1.499 m)   Wt 116 lb (52.6 kg)   BMI 23.43 kg/m   Physical Exam NAD Does appear sleepy HEENT:   PERRL, EOMI, discs difficult to see.  TMs pearly gray, throat without injection, MMM Neck:  supple, No adenopathy, no thyromegaly Chest:  CTA CV:  RRR without murmur or rub.  Radial and DP pulses normal and equal LE:  No edema Abd:  S, + BS, No HSM or mass, + mild tenderness in suprapubic area.  No flank tenderness   Assessment & Plan    Sleepiness/fatigue:  CBC CMP, urine for UA and culture.  Feel most likely has a low grade UTI.  Start macrobid 100 mg twice daily for 5 days.  GFR around 44 last check.  2.  DM, dyslipidemia, hypertension, cardiomyopathy:  labs.  Follow up in 2 months for all.  Consider addition of Jardiance or switch to Jardiance with her cardiomyopathy.

## 2022-03-24 LAB — CBC WITH DIFFERENTIAL/PLATELET
Basophils Absolute: 0.1 10*3/uL (ref 0.0–0.2)
Basos: 1 %
EOS (ABSOLUTE): 0.2 10*3/uL (ref 0.0–0.4)
Eos: 3 %
Hematocrit: 33.8 % — ABNORMAL LOW (ref 34.0–46.6)
Hemoglobin: 10.6 g/dL — ABNORMAL LOW (ref 11.1–15.9)
Immature Grans (Abs): 0 10*3/uL (ref 0.0–0.1)
Immature Granulocytes: 0 %
Lymphocytes Absolute: 2.2 10*3/uL (ref 0.7–3.1)
Lymphs: 28 %
MCH: 26 pg — ABNORMAL LOW (ref 26.6–33.0)
MCHC: 31.4 g/dL — ABNORMAL LOW (ref 31.5–35.7)
MCV: 83 fL (ref 79–97)
Monocytes Absolute: 0.5 10*3/uL (ref 0.1–0.9)
Monocytes: 6 %
Neutrophils Absolute: 4.7 10*3/uL (ref 1.4–7.0)
Neutrophils: 62 %
Platelets: 341 10*3/uL (ref 150–450)
RBC: 4.08 x10E6/uL (ref 3.77–5.28)
RDW: 13.9 % (ref 11.7–15.4)
WBC: 7.6 10*3/uL (ref 3.4–10.8)

## 2022-03-24 LAB — COMPREHENSIVE METABOLIC PANEL
ALT: 12 IU/L (ref 0–32)
AST: 14 IU/L (ref 0–40)
Albumin/Globulin Ratio: 1.1 — ABNORMAL LOW (ref 1.2–2.2)
Albumin: 3.9 g/dL (ref 3.7–4.7)
Alkaline Phosphatase: 102 IU/L (ref 44–121)
BUN/Creatinine Ratio: 12 (ref 12–28)
BUN: 26 mg/dL (ref 8–27)
Bilirubin Total: 0.2 mg/dL (ref 0.0–1.2)
CO2: 19 mmol/L — ABNORMAL LOW (ref 20–29)
Calcium: 9.2 mg/dL (ref 8.7–10.3)
Chloride: 96 mmol/L (ref 96–106)
Creatinine, Ser: 2.16 mg/dL — ABNORMAL HIGH (ref 0.57–1.00)
Globulin, Total: 3.4 g/dL (ref 1.5–4.5)
Glucose: 169 mg/dL — ABNORMAL HIGH (ref 70–99)
Potassium: 5 mmol/L (ref 3.5–5.2)
Sodium: 132 mmol/L — ABNORMAL LOW (ref 134–144)
Total Protein: 7.3 g/dL (ref 6.0–8.5)
eGFR: 22 mL/min/{1.73_m2} — ABNORMAL LOW (ref >59)

## 2022-03-24 LAB — LIPID PANEL W/O CHOL/HDL RATIO
Cholesterol, Total: 179 mg/dL (ref 100–199)
HDL: 54 mg/dL (ref >39)
LDL Chol Calc (NIH): 96 mg/dL (ref 0–99)
Triglycerides: 171 mg/dL — ABNORMAL HIGH (ref 0–149)
VLDL Cholesterol Cal: 29 mg/dL (ref 5–40)

## 2022-03-24 LAB — HGB A1C W/O EAG: Hgb A1c MFr Bld: 9.5 % — ABNORMAL HIGH (ref 4.8–5.6)

## 2022-03-24 LAB — TSH: TSH: 5.35 u[IU]/mL — ABNORMAL HIGH (ref 0.450–4.500)

## 2022-03-26 LAB — SPECIMEN STATUS REPORT

## 2022-03-30 ENCOUNTER — Telehealth: Payer: Self-pay

## 2022-03-30 NOTE — Telephone Encounter (Signed)
Patients daughter wanted to report that patient has not gotten better since last visit. Patient has been scheduled to return for ECG

## 2022-04-02 ENCOUNTER — Other Ambulatory Visit: Payer: Self-pay

## 2022-04-02 DIAGNOSIS — R8290 Unspecified abnormal findings in urine: Secondary | ICD-10-CM

## 2022-04-02 DIAGNOSIS — R5383 Other fatigue: Secondary | ICD-10-CM

## 2022-04-02 DIAGNOSIS — R7989 Other specified abnormal findings of blood chemistry: Secondary | ICD-10-CM

## 2022-04-02 LAB — POCT URINALYSIS DIPSTICK
Bilirubin, UA: NEGATIVE
Blood, UA: POSITIVE
Glucose, UA: NEGATIVE
Ketones, UA: NEGATIVE
Nitrite, UA: NEGATIVE
Protein, UA: POSITIVE — AB
Spec Grav, UA: 1.01 (ref 1.010–1.025)
Urobilinogen, UA: 0.2 E.U./dL
pH, UA: 8.5 — AB (ref 5.0–8.0)

## 2022-04-02 MED ORDER — CIPROFLOXACIN HCL 500 MG PO TABS: 500.0000 mg | ORAL_TABLET | Freq: Two times a day (BID) | ORAL | 0 refills | Status: DC

## 2022-04-02 NOTE — Progress Notes (Signed)
ECG without significant change from 09/21/2021.   Also needs repeat BMP, ua and urine culture. Finished Macrobid 3 days ago. Still with some dysuria. Fatigue continues to worsen. Daughter has noted mucous in her urine Patient states she does not feel she is having discharge--daughter Urine thrown out at [place] as stated was not labeled.    Discussed her A1C is continuing to rise. Daughter states her siblings have been helping her out with care and does not know what they are giving her to eat.  Brought in urine at 5 p.m.:  Significant mucous in urine.  + leuks, blood and protein.   Sending in cipro 500 mg twice daily for 3 days.  Hopefully urine culture in by time she completes this course and will extend if UTI sensitive to the abx.

## 2022-04-02 NOTE — Patient Instructions (Signed)
Tome un vaso de agua antes de cada comida Tome un minimo de 6 a 8 vasos de agua diarios Coma tres veces al dia Coma una proteina y una grasa saludable con comida.  (huevos, pescado, pollo, pavo, y limite carnes rojas Coma 5 porciones diarias de legumbres.  Mezcle los colores Coma 2 porciones diarias de frutas con cascara cuando sea comestible Use platos pequeos Suelte su tenedor o cuchara despues de cada mordida hata que se mastique y se trague Come en la mesa con amigos o familiares por lo menos una vez al dia Apague la televisin y aparatos electrnicos durante la comida  Su objetivo debe ser perder una libra por semana  Estudios recientes indican que las personas quienes consumen todos de sus calorias durante 12 horas se bajan de pesocon Mas eficiencia.  Por ejemplo, si Usted come su primera comida a las 7:00 a.m., su comida final del dia se debe completar antes de las 7:00 p.m. 

## 2022-04-03 LAB — BASIC METABOLIC PANEL
BUN/Creatinine Ratio: 15 (ref 12–28)
BUN: 34 mg/dL — ABNORMAL HIGH (ref 8–27)
CO2: 20 mmol/L (ref 20–29)
Calcium: 8.7 mg/dL (ref 8.7–10.3)
Chloride: 90 mmol/L — ABNORMAL LOW (ref 96–106)
Creatinine, Ser: 2.24 mg/dL — ABNORMAL HIGH (ref 0.57–1.00)
Glucose: 180 mg/dL — ABNORMAL HIGH (ref 70–99)
Potassium: 5.3 mmol/L — ABNORMAL HIGH (ref 3.5–5.2)
Sodium: 125 mmol/L — ABNORMAL LOW (ref 134–144)
eGFR: 21 mL/min/{1.73_m2} — ABNORMAL LOW (ref >59)

## 2022-04-05 LAB — URINE CULTURE

## 2022-04-05 MED ORDER — CIPROFLOXACIN HCL 250 MG PO TABS: 500.0000 mg | ORAL_TABLET | Freq: Two times a day (BID) | ORAL | 0 refills | Status: DC

## 2022-04-05 NOTE — Addendum Note (Signed)
Addended by: Marcelino Duster on: 04/05/2022 11:50 PM   Modules accepted: Orders

## 2022-04-06 ENCOUNTER — Other Ambulatory Visit: Payer: Self-pay

## 2022-04-06 MED ORDER — CARVEDILOL 3.125 MG PO TABS: 3.1250 mg | ORAL_TABLET | Freq: Two times a day (BID) | ORAL | 3 refills | Status: DC

## 2022-04-14 LAB — POCT URINALYSIS DIPSTICK

## 2022-04-21 ENCOUNTER — Ambulatory Visit: Payer: Self-pay | Admitting: Internal Medicine

## 2022-04-21 LAB — T4, FREE: Free T4: 1.25 ng/dL (ref 0.82–1.77)

## 2022-04-21 LAB — SPECIMEN STATUS REPORT

## 2022-04-28 ENCOUNTER — Telehealth: Payer: Self-pay | Admitting: Cardiovascular Disease

## 2022-04-28 NOTE — Telephone Encounter (Signed)
Spoke with daughter, who was not sure who had called her. I informed daughter that patient has blood work due on 11/6. Suggested she call PCP to see if they were calling her. She also wanted to schedule her mother for the 6 month f/u.

## 2022-04-28 NOTE — Telephone Encounter (Signed)
Patient called stating she received a call from [place].  I did not see anything document in epic.

## 2022-04-29 NOTE — Telephone Encounter (Signed)
04/29/22 LVM with daughter to schedule 6 month f/u with Dr. Claiborne Billings or an APP, pt is past due - [facility]

## 2022-04-30 ENCOUNTER — Encounter: Payer: Self-pay | Admitting: Adult Health

## 2022-04-30 ENCOUNTER — Ambulatory Visit: Payer: Self-pay | Attending: Adult Health | Admitting: Adult Health

## 2022-04-30 VITALS — BP 104/60 | HR 86 | Wt 115.4 lb

## 2022-04-30 DIAGNOSIS — E7800 Pure hypercholesterolemia, unspecified: Secondary | ICD-10-CM

## 2022-04-30 DIAGNOSIS — I2510 Atherosclerotic heart disease of native coronary artery without angina pectoris: Secondary | ICD-10-CM

## 2022-04-30 DIAGNOSIS — N289 Disorder of kidney and ureter, unspecified: Secondary | ICD-10-CM

## 2022-04-30 DIAGNOSIS — I255 Ischemic cardiomyopathy: Secondary | ICD-10-CM

## 2022-04-30 DIAGNOSIS — Z9861 Coronary angioplasty status: Secondary | ICD-10-CM

## 2022-04-30 DIAGNOSIS — I5042 Chronic combined systolic (congestive) and diastolic (congestive) heart failure: Secondary | ICD-10-CM

## 2022-04-30 MED ORDER — FUROSEMIDE 20 MG PO TABS: 20.0000 mg | ORAL_TABLET | Freq: Every day | ORAL | 1 refills | Status: AC | PRN

## 2022-04-30 NOTE — Progress Notes (Signed)
Cardiology Clinic Note   Patient Name: Sheila White Sheila White Date of Encounter: 04/30/2022  Primary Care Provider:  Mack Hook, MD Primary Cardiologist:  Shelva Majestic, MD  Patient Profile     81 y.o. female who is originally from [name].  She presented to [facility] on October 05, 2017 with an ST segment elevation anterior myocardial infarction with late presentation.  Her symptoms had developed the day prior to admission and became more progressive with reference to shortness of breath and continued chest pain.  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  She had 50% proximal OM1 stenosis followed by 95% distal marginal stenosis and there was mild irregularity of the RCA.  She underwent successful PCI to the LAD with ultimate insertion of a 2.5 x 26 mm Resolute DES stent postdilated 2.8 proximally and 2.68 distally with a 99% stenosis reduced to 0%.  The mid LAD stenosis was treated with DES stenting with a 2 to 5 x 12 mm Resolute stent.  The apical stenosis was treated with PTCA. She remains on Brilinta as she is a Plavix non-responder.  Other history included, ischemic CM most recent echo 08/24/2021 EF of 30-35%, with Grade I diastolic dysfunction. Class III NYHA, HL, NIDDM, post infarction pericarditis.   Past Medical History    Past Medical History:  Diagnosis Date   Acute combined systolic and diastolic CHF, NYHA class 3 ([place]) 10/10/2017   in setting of MI   Acute cystitis without hematuria    Acute on chronic systolic congestive heart failure ([place]) 10/20/2017   Anxiety and depression 12/19/2017   Chest pain 10/31/2017   Chest pain, rule out acute myocardial infarction 11/19/2017   Coronary artery disease 10/31/2017   Dyspnea 10/05/2017   Heart failure ([place]) 12/23/2017   HOH (hard of hearing)    Ischemic cardiomyopathy 10/05/2017   MI, acute, non ST segment  elevation (HCC)    Non-insulin dependent type 2 diabetes mellitus ([place]) 2004   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   STEMI involving left anterior descending coronary artery ([place]) 10/05/2017   Past Surgical History:  Procedure Laterality Date   CORONARY/GRAFT ACUTE MI REVASCULARIZATION N/A 10/05/2017   Procedure: Coronary/Graft Acute MI Revascularization;  Surgeon: Troy Sine, MD;  Location: [place] [facility];  Service: Cardiovascular;  Laterality: N/A;   LEFT HEART CATH AND CORONARY ANGIOGRAPHY N/A 10/05/2017   Procedure: LEFT HEART CATH AND CORONARY ANGIOGRAPHY;  Surgeon: Troy Sine, MD;  Location: [facility] [place] [facility];  Service: Cardiovascular;  Laterality: N/A;    Allergies  No Known Allergies  History of Present Illness    Mrs. Marchell Froman returns today for ongoing assessment and management of coronary artery disease.  She has an extensive history as outlined above.  Briefly the patient had a STEMI in 2019 of the anterior myocardium.  This required DES stenting to a 99% stenosed LAD, also apical stenosis treated with PTCA;, pericarditis post procedure.  Plavix nonresponder now on Plavix.  Most recent ejection fraction per echocardiogram EF of 30 to 36%, grade 1 diastolic dysfunction. Not a candidate for ARNI due to Stage IIIb CKD , refused ICD RT and referral to EP.   She comes today with her daughter and has no complaints.  She denies palpitations, dyspnea on exertion, edema, PND or orthopnea, or fatigue.  She has a good appetite and her daughter is limiting her salt intake.  She has been using furosemide as needed.  Her daughter states that she uses it possibly once a week depending upon her weight.  She denies any bleeding, hemoptysis, epistaxis.  She is complaining of right shoulder pain and right neck pain today.  She states that she probably slept on it wrong, but is having chronic right shoulder pain for which she is seeing a [facility].  She is also being  treated for UTI by PCP for E. coli found on urine culture.   Home Medications    Current Outpatient Medications  Medication Sig Dispense Refill   albuterol (VENTOLIN HFA) 108 (90 Base) MCG/ACT inhaler Inhale 1-2 puffs into the lungs every 6 (six) hours as needed for wheezing or shortness of breath. 6.7 g 0   aspirin 81 MG chewable tablet Chew 1 tablet (81 mg total) by mouth daily. 60 tablet 2   atorvastatin (LIPITOR) 80 MG tablet Take 1 tablet by mouth once daily 30 tablet 6   carvedilol (COREG) 3.125 MG tablet Take 1 tablet (3.125 mg total) by mouth 2 (two) times daily. 180 tablet 3   ciprofloxacin (CIPRO) 250 MG tablet Take 2 tablets (500 mg total) by mouth 2 (two) times daily. 8 tablet 0   famotidine (PEPCID) 20 MG tablet TAKE 2 TABLETS BY MOUTH AT BEDTIME 120 tablet 0   glimepiride (AMARYL) 2 MG tablet Take 1 tablet by mouth once daily with breakfast 90 tablet 2   loratadine (CLARITIN) 10 MG tablet Take 1 tablet (10 mg total) by mouth daily. 60 tablet 2   nitrofurantoin, macrocrystal-monohydrate, (MACROBID) 100 MG capsule 1 cap by mouth twice daily for 5 days 10 capsule 0   nitroGLYCERIN (NITROSTAT) 0.4 MG SL tablet Place 1 tablet (0.4 mg total) under the tongue every 5 (five) minutes x 3 doses as needed for chest pain. 25 tablet 2   Olopatadine HCl (PATADAY) 0.2 % SOLN 1 drop each eye twice daily as needed for eye allergies 2.5 mL 11   sertraline (ZOLOFT) 50 MG tablet TAKE 1/2 (ONE-HALF) TABLET BY MOUTH ONCE DAILY AT BEDTIME 15 tablet 6   ticagrelor (BRILINTA) 60 MG TABS tablet Take 1 tablet by mouth twice daily 60 tablet 3   furosemide (LASIX) 20 MG tablet Take 1 tablet (20 mg total) by mouth daily as needed for fluid. 30 tablet 1   No current facility-administered medications for this visit.     Family History    Family History  Problem Relation Age of Onset   Alcohol abuse Father    She indicated that her mother is deceased. She indicated that her father is deceased. She  indicated that her sister is alive. She indicated that four of her five brothers are alive. She indicated that two of her three daughters are alive. She indicated that all of her four sons are alive.  Social History    Social History   Socioeconomic History   Marital status: Widowed    Spouse name: Not on file   Number of children: 8   Years of education: 9   Highest education level: Not on file  Occupational History   Occupation: Housewife, retired  Tobacco Use   Smoking status: Never   Smokeless tobacco: Never  Vaping Use   Vaping Use: Never used  Substance and Sexual Activity   Alcohol use: Never   Drug use: Never   Sexual activity: Not Currently  Other Topics Concern   Not on file  Social History Narrative   Living with  daughter, Wray KearnsRosa Cruz and her husband and her 3 sons.   Social Determinants of Health   Financial Resource Strain: Not on file  Food Insecurity: No Food Insecurity (08/23/2018)   Hunger Vital Sign    Worried About Running Out of Food in the Last Year: Never true    Ran Out of Food in the Last Year: Never true  Transportation Needs: No Transportation Needs (08/23/2018)   PRAPARE - Administrator, Civil ServiceTransportation    Lack of Transportation (Medical): No    Lack of Transportation (Non-Medical): No  Physical Activity: Not on file  Stress: Not on file  Social Connections: Unknown (08/23/2018)   Social Connection and Isolation Panel [NHANES]    Frequency of Communication with Friends and Family: More than three times a week    Frequency of Social Gatherings with Friends and Family: Not on file    Attends Religious Services: 1 to 4 times per year    Active Member of [employer]ubs or Organizations: No    Attends BankerClub or Organization Meetings: Never    Marital Status: Not on file  Intimate Partner Violence: Not on file     Review of Systems    General:  No chills, fever, night sweats or weight changes.  Cardiovascular:  No chest pain, dyspnea on exertion, edema, orthopnea,  palpitations, paroxysmal nocturnal dyspnea. Dermatological: No rash, lesions/masses Respiratory: No cough, dyspnea Urologic: No hematuria, dysuria Abdominal:   No nausea, vomiting, diarrhea, bright red blood per rectum, melena, or hematemesis Neurologic:  No visual changes, wkns, changes in mental status. All other systems reviewed and are otherwise negative except as noted above.     Physical Exam    VS:  BP 104/60 (BP Location: Left Arm, Patient Position: Sitting)   Pulse 86   Wt 115 lb 6.4 oz (52.3 kg)   SpO2 91%   BMI 23.31 kg/m  , BMI Body mass index is 23.31 kg/m.     GEN: Well nourished, well developed, in no acute distress. HEENT: normal. Neck: Supple, no JVD, carotid bruits, or masses. Cardiac: RRR, soft 1/6 systolic murmur heard best at the apex murmurs, rubs, or gallops. No clubbing, cyanosis, edema.  Radials/DP/PT 1+ and equal bilaterally.  Respiratory:  Respirations regular and unlabored, clear to auscultation bilaterally. GI: Soft, nontender, nondistended, BS + x 4. MS: no deformity or atrophy.  Pain with range of motion of the right shoulder, hunching shoulders, moving shoulder and right arm, along with tenderness along the lateral side of the neck to palpation. Skin: warm and dry, no rash. Neuro:  Strength and sensation are intact. Psych: Normal affect.  Accessory Clinical Findings    ECG personally reviewed by me today-not completed this office visit.  Lab Results  Component Value Date   WBC 7.6 03/23/2022   HGB 10.6 (L) 03/23/2022   HCT 33.8 (L) 03/23/2022   MCV 83 03/23/2022   PLT 341 03/23/2022   Lab Results  Component Value Date   CREATININE 2.24 (H) 04/02/2022   BUN 34 (H) 04/02/2022   NA 125 (L) 04/02/2022   K 5.3 (H) 04/02/2022   CL 90 (L) 04/02/2022   CO2 20 04/02/2022   Lab Results  Component Value Date   ALT 12 03/23/2022   AST 14 03/23/2022   ALKPHOS 102 03/23/2022   BILITOT 0.2 03/23/2022   Lab Results  Component Value Date    CHOL 179 03/23/2022   HDL 54 03/23/2022   LDLCALC 96 03/23/2022   TRIG 171 (H) 03/23/2022   CHOLHDL  3.3 10/06/2017    Lab Results  Component Value Date   HGBA1C 9.5 (H) 03/23/2022    Review of Prior Studies: Echocardiogram 08/24/2021 1. Akinesis of the distal anteroseptal wall and apex; overall moderate to  severe LV dysfunction.   2. Left ventricular ejection fraction, by estimation, is 30 to 35%. The  left ventricle has moderate to severely decreased function. The left  ventricle demonstrates regional wall motion abnormalities (see scoring  diagram/findings for description). Left  ventricular diastolic parameters are consistent with Grade I diastolic  dysfunction (impaired relaxation). Elevated left atrial pressure.   3. Right ventricular systolic function is normal. The right ventricular  size is normal.   4. The mitral valve is normal in structure. No evidence of mitral valve  regurgitation. No evidence of mitral stenosis.   5. The aortic valve is tricuspid. Aortic valve regurgitation is trivial.  Aortic valve sclerosis is present, with no evidence of aortic valve  stenosis.   6. The inferior vena cava is normal in size with greater than 50%  respiratory variability, suggesting right atrial pressure of 3 mmHg.   Left Heart Cath 10/05/2021 Mid LAD lesion is 90% stenosed. Prox LAD lesion is 99% stenosed. Dist LAD lesion is 99% stenosed. 1st Mrg lesion is 95% stenosed. Ost 2nd Mrg to 2nd Mrg lesion is 30% stenosed. Ost RPDA to RPDA lesion is 30% stenosed. Post intervention, there is a 0% residual stenosis. Post intervention, there is a 0% residual stenosis. Post intervention, there is a 35% residual stenosis. A stent was successfully placed. A stent was successfully placed.   Acute late presentation anterior ST segment elevation myocardial infarction with demonstration of subtotal long proximal LAD stenosis, 90% mid stenosis, and 99% apical stenosis with reduced apical  flow.   Left circumflex disease with 50% proximal OM1 stenosis followed by distal 95% marginal stenosis prior to giving off distal branches in the OM1 vessel.   Mild diffuse irregularity of a large dominant RCA with 30% narrowing in the PDA vessel.   LVEDP 34 mmHg/   Successful PCI to the LAD with ultimate insertion of a 2.526 mm Resolute DES stent postdilated to 2.8 mm proximally and 2.68 mm distally with the 99% stenosis being reduced to 0%; DES stenting of the mid 90% stenosis with ultimate insertion of a 2.2512 mm Resolute DES stent with residual narrowing at 0%, and initial 99% apical thrombotic stenosis with reduced flow treated with low-level PTCA and intracoronary verapamil with improvement to approximately 35-40% in a very small apical LAD segment.   Assessment & Plan   1.  Coronary artery disease: Status post STEMI in 2019, with successful PCI to the LAD, with DES, DES to the mid LAD stenosis, and PTCA of a 99% apical thrombotic stenosis.  The patient offers no complaints of recurrent discomfort, angina pain, or significant fatigue.  She continues to be medically compliant, her daughter make sure she takes her medicines every day.  She continues on Brilinta 60 mg twice a day with no bleeding issues.  She is a nonresponder to Plavix.  Continue her on her current regimen as she is stable.  2.  Chronic combined HFrEF:  Most recent echocardiogram revealed an EF of 30 to 35% in February 2023 with grade 1 diastolic dysfunction.  She is not a candidate for Entresto due to chronic kidney disease.  She does not have evidence of volume overload on examination.  Continue carvedilol 3.125 mg twice daily, blood pressure is low normal for her, prescription  for furosemide is provided for as needed use.  3.  Hypercholesterolemia: Goal of LDL less than 70 with known heart disease.  She remains on atorvastatin 80 mg daily.  She has had recent labs completed by her primary care provider.  I do not have  results of lipid status.  Current medicines are reviewed at length with the patient today.  I have spent 25 min's  dedicated to the care of this patient on the date of this encounter to include pre-visit review of records, assessment, management and diagnostic testing,with shared decision making.  Signed, Bettey Mare. Liborio Nixon, ANP, AACC   04/30/2022 4:03 PM      Office [phone] Fax [phone]  Notice: This dictation was prepared with Dragon dictation along with smaller phrase technology. Any transcriptional errors that result from this process are unintentional and may not be corrected upon review.

## 2022-04-30 NOTE — Patient Instructions (Signed)
Medication Instructions:  No Changes *If you need a refill on your cardiac medications before your next appointment, please call your pharmacy*   Lab Work: No Changes If you have labs (blood work) drawn today and your tests are completely normal, you will receive your results only by: [place] (if you have MyChart) OR A paper copy in the mail If you have any lab test that is abnormal or we need to change your treatment, we will call you to review the results.   Testing/Procedures: No Testing   Follow-Up: At [facility], you and your health needs are our priority.  As part of our continuing mission to provide you with exceptional heart care, we have created designated Provider Care Teams.  These Care Teams include your primary Cardiologist (physician) and Advanced Practice Providers (APPs -  Physician Assistants and Nurse Practitioners) who all work together to provide you with the care you need, when you need it.  We recommend signing up for the patient portal called "MyChart".  Sign up information is provided on this After Visit Summary.  MyChart is used to connect with patients for Virtual Visits (Telemedicine).  Patients are able to view lab/test results, encounter notes, upcoming appointments, etc.  Non-urgent messages can be sent to your provider as well.   To learn more about what you can do with MyChart, go to [email or web address].    Your next appointment:   6 week(s)  The format for your next appointment:   In Person  Provider:   Shelva Majestic, MD

## 2022-05-03 ENCOUNTER — Other Ambulatory Visit: Payer: Self-pay

## 2022-05-03 DIAGNOSIS — N1832 Chronic kidney disease, stage 3b: Secondary | ICD-10-CM

## 2022-05-04 LAB — BASIC METABOLIC PANEL
BUN/Creatinine Ratio: 20 (ref 12–28)
BUN: 35 mg/dL — ABNORMAL HIGH (ref 8–27)
CO2: 21 mmol/L (ref 20–29)
Calcium: 8.8 mg/dL (ref 8.7–10.3)
Chloride: 98 mmol/L (ref 96–106)
Creatinine, Ser: 1.79 mg/dL — ABNORMAL HIGH (ref 0.57–1.00)
Glucose: 195 mg/dL — ABNORMAL HIGH (ref 70–99)
Potassium: 5.2 mmol/L (ref 3.5–5.2)
Sodium: 131 mmol/L — ABNORMAL LOW (ref 134–144)
eGFR: 28 mL/min/{1.73_m2} — ABNORMAL LOW (ref >59)

## 2022-05-28 ENCOUNTER — Ambulatory Visit: Payer: Self-pay | Admitting: Internal Medicine

## 2022-05-28 ENCOUNTER — Encounter: Payer: Self-pay | Admitting: Internal Medicine

## 2022-05-28 VITALS — BP 138/60 | HR 68 | Resp 16 | Ht 59.0 in | Wt 117.0 lb

## 2022-05-28 DIAGNOSIS — I10 Essential (primary) hypertension: Secondary | ICD-10-CM

## 2022-05-28 DIAGNOSIS — R5383 Other fatigue: Secondary | ICD-10-CM

## 2022-05-28 DIAGNOSIS — I255 Ischemic cardiomyopathy: Secondary | ICD-10-CM

## 2022-05-28 DIAGNOSIS — E785 Hyperlipidemia, unspecified: Secondary | ICD-10-CM

## 2022-05-28 DIAGNOSIS — Z23 Encounter for immunization: Secondary | ICD-10-CM

## 2022-05-28 DIAGNOSIS — E119 Type 2 diabetes mellitus without complications: Secondary | ICD-10-CM

## 2022-05-28 DIAGNOSIS — N1832 Chronic kidney disease, stage 3b: Secondary | ICD-10-CM

## 2022-05-28 NOTE — Progress Notes (Signed)
Subjective:    Patient ID: Sheila White, female   DOB: 12/25/1940, 81 y.o.   MRN: [id number]   HPI  Daughter interprets   UTI with fatigue and poor appetite:  Originally treated with Nitrofurantoin without improvement.  Culture showed was sensitive to nitrofurantoin, but she did not improve.  Recultured and treated with Cipro with good response.  Doing very well now.    2.  DM:  Sugars generally 135, but never above 160.  When she was ill, sugars were running in 200s.  A1C in September while ill was up to 9.5%.  Has not had diabetic eye exam this year.  3.  Elevated TSH:  also when ill.  Free T4 was well within normal range.  Not clear if had anything to do with extended illness or if developing hypothyroidism/subclinical.    4.  CAD/Cardiomyopathy:  No LE edema.  No dyspnea or chest pain.  No PND or orthopnea.    Current Meds  Medication Sig   albuterol (VENTOLIN HFA) 108 (90 Base) MCG/ACT inhaler Inhale 1-2 puffs into the lungs every 6 (six) hours as needed for wheezing or shortness of breath.   aspirin 81 MG chewable tablet Chew 1 tablet (81 mg total) by mouth daily.   atorvastatin (LIPITOR) 80 MG tablet Take 1 tablet by mouth once daily   carvedilol (COREG) 3.125 MG tablet Take 1 tablet (3.125 mg total) by mouth 2 (two) times daily.   famotidine (PEPCID) 20 MG tablet TAKE 2 TABLETS BY MOUTH AT BEDTIME   furosemide (LASIX) 20 MG tablet Take 1 tablet (20 mg total) by mouth daily as needed for fluid.   glimepiride (AMARYL) 2 MG tablet Take 1 tablet by mouth once daily with breakfast   loratadine (CLARITIN) 10 MG tablet Take 1 tablet (10 mg total) by mouth daily.   nitroGLYCERIN (NITROSTAT) 0.4 MG SL tablet Place 1 tablet (0.4 mg total) under the tongue every 5 (five) minutes x 3 doses as needed for chest pain.   Olopatadine HCl (PATADAY) 0.2 % SOLN 1 drop each eye twice daily as needed for eye allergies   sertraline (ZOLOFT) 50 MG tablet TAKE 1/2 (ONE-HALF) TABLET BY  MOUTH ONCE DAILY AT BEDTIME   ticagrelor (BRILINTA) 60 MG TABS tablet Take 1 tablet by mouth twice daily   No Known Allergies   Review of Systems    Objective:   BP 138/60 (BP Location: Left Arm, Patient Position: Sitting, Cuff Size: Normal)   Pulse 68   Resp 16   Ht 4\' 11"  (1.499 m)   Wt 117 lb (53.1 kg)   BMI 23.63 kg/m   Physical Exam Constitutional:      General: She is not in acute distress. HENT:     Right Ear: Tympanic membrane normal.     Left Ear: Tympanic membrane normal.     Mouth/Throat:     Mouth: Mucous membranes are moist.     Pharynx: Oropharynx is clear.  Eyes:     Extraocular Movements: Extraocular movements intact.     Pupils: Pupils are equal, round, and reactive to light.  Neck:     Thyroid: No thyroid mass or thyromegaly.  Cardiovascular:     Rate and Rhythm: Normal rate and regular rhythm.     Heart sounds: S1 normal and S2 normal. No murmur heard.    No friction rub. No S3 or S4 sounds.     Comments: No carotid bruits.  Carotid, radial, femoral, DP  and PT pulses normal and equal.   Pulmonary:     Effort: Pulmonary effort is normal.     Breath sounds: Normal breath sounds and air entry.  Abdominal:     General: Bowel sounds are normal.     Palpations: Abdomen is soft. There is no hepatomegaly, splenomegaly or mass.     Tenderness: There is no abdominal tenderness.  Musculoskeletal:     Cervical back: Normal range of motion and neck supple.     Right lower leg: No edema.     Left lower leg: No edema.  Skin:    General: Skin is warm.  Neurological:     Mental Status: She is alert.    Assessment & Plan   UTI:  ultimately responded to Cipro and fatigue/poor appetite resolved  2.  DM:  not controlled with last check.  A1C with fasting labs in 5 weeks.  Referral for diabetic eye exam.    3.  CKD:  CMP with fasting labs in 5 weeks.  Control DM and htn/cardiomyopathy.  4.  Ischemic cardiomyopathy:  compensated.  5.  Dyslipidemia:  FLP  with fasting labs 5 weeks.  6.  HM:  Moderna COVID spikevax.  Encouraged going to PHD for RSV.  Shingrix #2 with fasting labs in 5 weeks.

## 2022-05-28 NOTE — Patient Instructions (Signed)
Recommend RSV vaccine at [facility] For immunizations at [facility]:  Children and Adults:  and [employer]: [phone] (Information in Spanish available)

## 2022-06-18 ENCOUNTER — Other Ambulatory Visit: Payer: Self-pay | Admitting: Internal Medicine

## 2022-06-18 ENCOUNTER — Other Ambulatory Visit: Payer: Self-pay

## 2022-07-02 ENCOUNTER — Other Ambulatory Visit: Payer: Self-pay

## 2022-07-02 DIAGNOSIS — Z79899 Other long term (current) drug therapy: Secondary | ICD-10-CM

## 2022-07-02 DIAGNOSIS — Z23 Encounter for immunization: Secondary | ICD-10-CM

## 2022-07-02 DIAGNOSIS — E785 Hyperlipidemia, unspecified: Secondary | ICD-10-CM

## 2022-07-02 DIAGNOSIS — E119 Type 2 diabetes mellitus without complications: Secondary | ICD-10-CM

## 2022-07-02 NOTE — Addendum Note (Signed)
Addended by: Mariah Milling on: 07/02/2022 10:02 AM   Modules accepted: Orders

## 2022-07-03 LAB — COMPREHENSIVE METABOLIC PANEL
ALT: 14 IU/L (ref 0–32)
AST: 16 IU/L (ref 0–40)
Albumin/Globulin Ratio: 1.2 (ref 1.2–2.2)
Albumin: 3.6 g/dL — ABNORMAL LOW (ref 3.7–4.7)
Alkaline Phosphatase: 97 IU/L (ref 44–121)
BUN/Creatinine Ratio: 16 (ref 12–28)
BUN: 26 mg/dL (ref 8–27)
Bilirubin Total: <0.2 mg/dL (ref 0.0–1.2)
CO2: 22 mmol/L (ref 20–29)
Calcium: 8.9 mg/dL (ref 8.7–10.3)
Chloride: 102 mmol/L (ref 96–106)
Creatinine, Ser: 1.64 mg/dL — ABNORMAL HIGH (ref 0.57–1.00)
Globulin, Total: 3 g/dL (ref 1.5–4.5)
Glucose: 135 mg/dL — ABNORMAL HIGH (ref 70–99)
Potassium: 5 mmol/L (ref 3.5–5.2)
Sodium: 136 mmol/L (ref 134–144)
Total Protein: 6.6 g/dL (ref 6.0–8.5)
eGFR: 31 mL/min/{1.73_m2} — ABNORMAL LOW (ref >59)

## 2022-07-03 LAB — CBC WITH DIFFERENTIAL/PLATELET
Basophils Absolute: 0 10*3/uL (ref 0.0–0.2)
Basos: 1 %
EOS (ABSOLUTE): 0.2 10*3/uL (ref 0.0–0.4)
Eos: 4 %
Hematocrit: 32.8 % — ABNORMAL LOW (ref 34.0–46.6)
Hemoglobin: 10.4 g/dL — ABNORMAL LOW (ref 11.1–15.9)
Immature Grans (Abs): 0 10*3/uL (ref 0.0–0.1)
Immature Granulocytes: 0 %
Lymphocytes Absolute: 2.1 10*3/uL (ref 0.7–3.1)
Lymphs: 33 %
MCH: 27.3 pg (ref 26.6–33.0)
MCHC: 31.7 g/dL (ref 31.5–35.7)
MCV: 86 fL (ref 79–97)
Monocytes Absolute: 0.5 10*3/uL (ref 0.1–0.9)
Monocytes: 8 %
Neutrophils Absolute: 3.6 10*3/uL (ref 1.4–7.0)
Neutrophils: 54 %
Platelets: 225 10*3/uL (ref 150–450)
RBC: 3.81 x10E6/uL (ref 3.77–5.28)
RDW: 15 % (ref 11.7–15.4)
WBC: 6.5 10*3/uL (ref 3.4–10.8)

## 2022-07-03 LAB — LIPID PANEL W/O CHOL/HDL RATIO
Cholesterol, Total: 143 mg/dL (ref 100–199)
HDL: 50 mg/dL (ref >39)
LDL Chol Calc (NIH): 71 mg/dL (ref 0–99)
Triglycerides: 122 mg/dL (ref 0–149)
VLDL Cholesterol Cal: 22 mg/dL (ref 5–40)

## 2022-07-03 LAB — MICROALBUMIN / CREATININE URINE RATIO

## 2022-07-03 LAB — HEMOGLOBIN A1C
Est. average glucose Bld gHb Est-mCnc: 166 mg/dL
Hgb A1c MFr Bld: 7.4 % — ABNORMAL HIGH (ref 4.8–5.6)

## 2022-08-04 ENCOUNTER — Other Ambulatory Visit: Payer: Self-pay

## 2022-08-04 MED ORDER — ATORVASTATIN CALCIUM 80 MG PO TABS: 80.0000 mg | ORAL_TABLET | Freq: Every day | ORAL | 11 refills | Status: DC

## 2022-08-23 ENCOUNTER — Other Ambulatory Visit: Payer: Self-pay

## 2022-08-23 ENCOUNTER — Other Ambulatory Visit: Payer: Self-pay | Admitting: Internal Medicine

## 2022-10-27 ENCOUNTER — Telehealth: Payer: Self-pay | Admitting: Cardiovascular Disease

## 2022-10-27 NOTE — Telephone Encounter (Signed)
Returned call to patients daughter- okay per DPR states that she dropped off patients assistance forms 3 weeks ago and has not heard back. Advised patient's daughter that I would forward message over to Dr. Landry Dyke nurse to inquire about forms.   Do not see documentation from forms being dropped off.

## 2022-10-27 NOTE — Telephone Encounter (Signed)
Pt daughter called in stating she dropped off paperwork for [employer] about 3 weeks ago for Dr. Tresa Endo and she has not heard back yet, please advise.

## 2022-10-29 NOTE — Telephone Encounter (Signed)
I had forms signed by MD, and faxed off today 10/29/2022.  Will monitor for response.  Thanks!

## 2022-11-01 ENCOUNTER — Telehealth: Payer: Self-pay | Admitting: Cardiovascular Disease

## 2022-11-01 ENCOUNTER — Encounter: Payer: Self-pay | Admitting: Cardiovascular Disease

## 2022-11-01 ENCOUNTER — Ambulatory Visit: Payer: Self-pay | Attending: Cardiovascular Disease | Admitting: Cardiovascular Disease

## 2022-11-01 DIAGNOSIS — Z7985 Long-term (current) use of injectable non-insulin antidiabetic drugs: Secondary | ICD-10-CM

## 2022-11-01 DIAGNOSIS — E119 Type 2 diabetes mellitus without complications: Secondary | ICD-10-CM

## 2022-11-01 DIAGNOSIS — I2510 Atherosclerotic heart disease of native coronary artery without angina pectoris: Secondary | ICD-10-CM

## 2022-11-01 DIAGNOSIS — I2102 ST elevation (STEMI) myocardial infarction involving left anterior descending coronary artery: Secondary | ICD-10-CM

## 2022-11-01 DIAGNOSIS — N1832 Chronic kidney disease, stage 3b: Secondary | ICD-10-CM

## 2022-11-01 DIAGNOSIS — E785 Hyperlipidemia, unspecified: Secondary | ICD-10-CM

## 2022-11-01 DIAGNOSIS — I255 Ischemic cardiomyopathy: Secondary | ICD-10-CM

## 2022-11-01 DIAGNOSIS — Z9861 Coronary angioplasty status: Secondary | ICD-10-CM

## 2022-11-01 NOTE — Progress Notes (Unsigned)
Cardiology Office Note    Date:  11/03/2022   ID:  Sheila White,  Nov 21, 1940, MRN [id number]  PCP:  Julieanne Manson, MD  Cardiologist:  Nicki Guadalajara, MD   14 month F/u office visit .  History of Present Illness:  Sheila White is a 82 y.o. female who is originally [place]  She presents for a 44-month follow-up evaluation.   Ms. Sheila White  presented to [facility] on October 05, 2017 with an ST segment elevation anterior myocardial infarction with late presentation.  Her symptoms had developed the day prior to admission and became more progressive with reference to shortness of breath and continued chest pain.  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  She had 50% proximal OM1 stenosis followed by 95% distal marginal stenosis and there was mild irregularity of the RCA.  She underwent successful PCI to the LAD with ultimate insertion of a 2.5 x 26 mm Resolute DES stent postdilated 2.8 proximally and 2.68 distally with a 99% stenosis reduced to 0%.  The mid LAD stenosis was treated with DES stenting with a 2 to 5 x 12 mm Resolute stent.  The apical stenosis was treated with PTCA.  Her White was complicated by postinfarction pericarditis, acute kidney injury, hyponatremia, abdominal pain, weakness, persistent cough.  Her peak creatinine was 3.86 which improved to 1.88 at discharge.  He was ultimately discharged on October 24, 2017 and on Oct 28, 2017 so Sheila White for her initial office evaluation.  Patient has had issues with chronic cough.  Her pericardial symptoms had improved and she was treated with colchicine.    I saw her for initial post hospital follow-up evaluation on Nov 18, 2017.  At that time she denied any recurrent chest pain symptomatology.  She was tentatively scheduled to travel to [place] several days later and I  recommended that she postpone this trip.  Since I saw her, she  developed some chest pain and was hospitalized on May 25 through Nov 22, 2017.  Troponins were normal.  Follow-up echo Doppler study showed an EF of 30 to 35%.  There was mid distal anterior, apical inferoapical severe hypokinesis secondary to her prior LAD territory infarct.  There was grade 1 diastolic dysfunction.  She also complained of some blurry vision with gait abnormality.  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  She was hyponatremic which revolved resolved.  She was seen by Sheila White post discharge on December 01, 2017.  At that time, her blood pressure was stable and pulse 77.  I saw her in June 2019.  At that time the plan was to start Plavix due to her foreign status and apparently Plavix only rather than Brilinta could be used.  However, P2Y12 test demonstrated that she was not responsive to Plavix.  As a result we have been supplying her with samples of Brilinta.   When I saw her in September 2019 her renal function was gradually improving.  She was not having any anginal symptoms and her blood pressure was stable.  I last saw her in the office in January 2020 at which time she continued to feel well.    Most recent laboratory has shown further improvement in renal function with a creatinine of 1.48 improved from 1.61 in December 2019.  She has continued to be stable.  She has been  taking aspirin and Brilinta for DAPT.  She is on atorvastatin 80 mg for hyperlipidemia with target LDL less than 70.  She is unaware of any palpitations and continues to take carvedilol 6.25 mg twice a day.  She has been taking furosemide 40 mg daily without edema.  Renal function had stabilized with creatinines in the 1.4 range.  At that time, I elected to initiate low-dose Entresto in light of her continued LV dysfunction EF at 30 to 35%.  There were no signs of overload.  I recommended follow-up laboratory 2 weeks later and reassessment with  our pharmacist.  She was evaluated by Sheila Course, PA in a telemedicine visit in April 2020.  She was doing well and her Lasix dose had been reduced to allow the addition of Entresto at her prior evaluation.  Since her blood pressure had been low at the pharmacy evaluation she continues to be on the 24/26 mg twice daily regimen of Entresto.  She is a Plavix nonresponder and for this reason has been maintained on Brilinta.  I saw her in July 17, 2019 for 1 year evaluation. She continued to do well.  She specifically denied chest pain or shortness of breath.  She has noticed significant benefit with the initiation of Entresto.  She had not had recent laboratory since July 2020 and at that time creatinine was 1.55.  Lipid studies revealed a cholesterol of 158, HDL 61, LDL 77 and triglycerides 100.  During that evaluation, I recommended that she undergo a follow-up echo Doppler study to reassess LV function.  She continues to be on aspirin/Brilinta and at that time per Pegasys trial data I reduced her Brilinta dose to 60 mg twice a day.  I recommended she undergo follow-up laboratory.    Her most recent echo Doppler study of July 25, 2019 showed continued reduced LV function with EF of 25 to 30%, mild LVH and grade 3, restrictive physiology diastolic dysfunction.  She had moderate dilation of her left atrium.  There was moderate mitral annular calcification with mild MR, mild TR and mild PR.  She continued to feel well and was without anginal symptoms or significant shortness of breath.  Since her prior evaluation she has been seen by several APP's and also was evaluated by Dr. Sharrell Ku for consideration of possible implantable cardioverter defibrillator.  Ultimately, the patient decided against this and has not received therapy.  I last saw her as 68 2022.  At time she denied any chest pain and felt she was doing well.   She denies significant dizziness but if she stands up quickly she does get a little  lightheaded.  She continues to be on aspirin and low-dose Brilinta 60 mg twice a day per Pegasys trial data.  She is on guideline directed medical therapy with carvedilol 6.25 mg twice a day, Entresto 24/26 mg twice a day, and continues to be on isosorbide 15 mg twice a day and 20 mg of furosemide which she states she only rarely takes.  She denies recent swelling, palpitations, chest pain, PND orthopnea.  She presents to the office today for evaluation and is here with her daughter as well as an interpreter.  During that evaluation, she had orthostatic hypotension with supine blood pressure 110/70 which decreased to 85/50 with standing.  As result, I recommended she reduce her isosorbide from 15 mg twice a day to just 50 mg daily and reduced her carvedilol from 6.25 mg twice a day down to 3.125 mg  twice a day. She continued to be on DAPT with aspirin/low-dose Brilinta.  Laboratory from August for not sent by me showed a creatinine of 1.42.  I recommended follow-up potassium level which was 5.1 with a creatinine of 1.50  She was seen by Fannie Knee on March 31, 2021.  Repeat laboratory at that time now showed potassium increased to 5.9.  Her Sherryll Burger was discontinued.  Apparently she was also taken off her low-dose isosorbide. Repeat laboratory on April 03, 2021 showed improvement in potassium to 4.7.  Creatinine was 1.54.  I saw her in May 04, 2021 at which time she felt well and denied any chest pain, shortness of breath, dizziness, presyncope or syncope.  She was unaware of palpitations..  She continues to be on atorvastatin 80 mg daily with most recent LDL cholesterol at 62.  She is on low-dose aspirin with Brilinta 60 mg twice a day.  She continues to be on carvedilol 3.125 mg twice a day and has a prescription for furosemide 20 mg to take as needed.  She is no longer on nitrate therapy or Entresto.  She is on on glimepiride for her diabetes mellitus.  During that evaluation, she was euvolemic and was  not needing furosemide.  I recommended a follow-up echo Doppler study for reassessment of LV function.  She underwent her echo Doppler study on August 24, 2021.  LV function remained moderate to severely depressed with EF estimated at 30 to 35% with wall motion abnormalities involving akinesis of the distal anteroseptal wall and apex.  There was grade 1 diastolic dysfunction.  There was no significant valvular abnormality.  EF was slightly improved from her prior evaluation.  I last saw her on September 21, 2021 at which time she continued to feel well.  She denied any recurrent chest pain or shortness of breath.  There were no episodes of dizziness, presyncope or syncope or palpitations.  She continues to be on DAPT with aspirin and low-dose Brilinta with her Plavix unresponsiveness.  This I last saw her, she has remained stable.  He continues to be on aspirin/Brilinta 60 mg twice a day and is without bleeding.  She is on carvedilol 3.125 mg twice a day.  She has a prescription for furosemide 20 mg but rarely takes this.  There has been no swelling.  She is diabetic on glimepiride.  She is on atorvastatin 80 mg for hyperlipidemia.  Laboratory on July 02, 2022 showed total cholesterol 143 with LDL cholesterol 71 and triglycerides 122.  Since she has CKD and creatinine was 1.64.  She will be seeing Dr. Julieanne Manson her primary MD in the near future and will undergo complete set of laboratory.  She presents for evaluation  Past Medical History:  Diagnosis Date   Acute combined systolic and diastolic CHF, NYHA class 3 (HCC) 10/10/2017   in setting of MI   Acute cystitis without hematuria    Acute on chronic systolic congestive heart failure (HCC) 10/20/2017   Anxiety and depression 12/19/2017   Chest pain 10/31/2017   Chest pain, rule out acute myocardial infarction 11/19/2017   Coronary artery disease 10/31/2017   Dyspnea 10/05/2017   Heart failure (HCC) 12/23/2017   HOH (hard of hearing)    Ischemic  cardiomyopathy 10/05/2017   MI, acute, non ST segment elevation (HCC)    Non-insulin dependent type 2 diabetes mellitus (HCC) 2004   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   STEMI involving left anterior descending coronary artery (HCC) 10/05/2017  Past Surgical History:  Procedure Laterality Date   CORONARY/GRAFT ACUTE MI REVASCULARIZATION N/A 10/05/2017   Procedure: Coronary/Graft Acute MI Revascularization;  Surgeon: Lennette Bihari, MD;  [facility] IN[facility] CV LAB;  Service: Cardiovascular;  Laterality: N/A;   LEFT HEART CATH AND CORONARY ANGIOGRAPHY N/A 10/05/2017   Procedure: LEFT HEART CATH AND CORONARY ANGIOGRAPHY;  Surgeon: Lennette Bihari, MD;  Location: [facility] INVASIVE CV LAB;  Service: Cardiovascular;  Laterality: N/A;    Current Medications: Outpatient Medications Prior to Visit  Medication Sig Dispense Refill   aspirin 81 MG chewable tablet Chew 1 tablet (81 mg total) by mouth daily. 60 tablet 2   atorvastatin (LIPITOR) 80 MG tablet Take 1 tablet (80 mg total) by mouth daily. 30 tablet 11   carvedilol (COREG) 3.125 MG tablet Take 1 tablet (3.125 mg total) by mouth 2 (two) times daily. 180 tablet 3   famotidine (PEPCID) 20 MG tablet TAKE 2 TABLETS BY MOUTH AT BEDTIME 120 tablet 0   furosemide (LASIX) 20 MG tablet Take 1 tablet (20 mg total) by mouth daily as needed for fluid. 30 tablet 1   glimepiride (AMARYL) 2 MG tablet Take 1 tablet by mouth once daily with breakfast 90 tablet 3   loratadine (CLARITIN) 10 MG tablet Take 1 tablet (10 mg total) by mouth daily. 60 tablet 2   Olopatadine HCl (PATADAY) 0.2 % SOLN 1 drop each eye twice daily as needed for eye allergies 2.5 mL 11   sertraline (ZOLOFT) 50 MG tablet TAKE 1/2 (ONE-HALF) TABLET BY MOUTH ONCE DAILY AT BEDTIME 15 tablet 9   ticagrelor (BRILINTA) 60 MG TABS tablet Take 1 tablet by mouth twice daily 60 tablet 3   albuterol (VENTOLIN HFA) 108 (90 Base) MCG/ACT inhaler Inhale 1-2 puffs into the lungs every 6 (six)  hours as needed for wheezing or shortness of breath. (Patient not taking: Reported on 11/01/2022) 6.7 g 0   nitroGLYCERIN (NITROSTAT) 0.4 MG SL tablet Place 1 tablet (0.4 mg total) under the tongue every 5 (five) minutes x 3 doses as needed for chest pain. (Patient not taking: Reported on 11/01/2022) 25 tablet 2   No facility-administered medications prior to visit.     Allergies:   Patient has no known allergies.   Social History   Socioeconomic History   Marital status: Widowed    Spouse name: Not on file   Number of children: 8   Years of education: 9   Highest education level: Not on file  Occupational History   Occupation: Housewife, retired  Tobacco Use   Smoking status: Never   Smokeless tobacco: Never  Vaping Use   Vaping Use: Never used  Substance and Sexual Activity   Alcohol use: Never   Drug use: Never   Sexual activity: Not Currently  Other Topics Concern   Not on file  Social History Narrative   Living with daughter, Wray Kearns and her husband and her 3 sons.   Social Determinants of Health   Financial Resource Strain: Not on file  Food Insecurity: No Food Insecurity (08/23/2018)   Hunger Vital Sign    Worried About Running Out of Food in the Last Year: Never true    Ran Out of Food in the Last Year: Never true  Transportation Needs: No Transportation Needs (08/23/2018)   PRAPARE - Administrator, Civil Service (Medical): No    Lack of Transportation (Non-Medical): No  Physical Activity: Not on file  Stress: Not on file  Social  Connections: Unknown (08/23/2018)   Social Connection and Isolation Panel [NHANES]    Frequency of Communication with Friends and Family: More than three times a week    Frequency of Social Gatherings with Friends and Family: Not on file    Attends Religious Services: 1 to 4 times per year    Active Member of [employer] or Organizations: No    Attends Banker Meetings: Never    Marital Status: Not on file      Family History:  The patient's family history includes Alcohol abuse in her father.   ROS General: Negative; No fevers, chills, or night sweats;  HEENT: Negative; No changes in vision or hearing, sinus congestion, difficulty swallowing Pulmonary: Negative; No cough, wheezing, shortness of breath, hemoptysis Cardiovascular: See HPI GI: Obvious nausea, resolved Musculoskeletal: Negative; no myalgias, joint pain, or weakness Hematologic/Oncology: Negative; no easy bruising, bleeding Endocrine: Negative; no heat/cold intolerance; no diabetes Neuro: Negative; no changes in balance, headaches; previous dizziness resolved Skin: Negative; No rashes or skin lesions Psychiatric: Negative; No behavioral problems, depression Sleep: Negative; No snoring, daytime sleepiness, hypersomnolence, bruxism, restless legs, hypnogognic hallucinations, no cataplexy Other comprehensive 14 point system review is negative.   PHYSICAL EXAM:   VS:  BP 118/78   Pulse 66   Ht 4\' 11"  (1.499 m)   Wt 122 lb (55.3 kg)   SpO2 98%   BMI 24.64 kg/m     Repeat blood pressure by me was 116/72.  Wt Readings from Last 3 Encounters:  11/01/22 122 lb (55.3 kg)  05/28/22 117 lb (53.1 kg)  04/30/22 115 lb 6.4 oz (52.3 kg)    General: Alert, oriented, no distress.  Skin: normal turgor, no rashes, warm and dry HEENT: Normocephalic, atraumatic. Pupils equal round and reactive to light; sclera anicteric; extraocular muscles intact;  Nose without nasal septal hypertrophy Mouth/Parynx benign; Mallinpatti scale 3 Neck: No JVD, no carotid bruits; normal carotid upstroke Lungs: clear to ausculatation and percussion; no wheezing or rales Chest wall: without tenderness to palpitation Heart: PMI not displaced, RRR, s1 s2 normal, 1/6 systolic murmur, no diastolic murmur, no rubs, gallops, thrills, or heaves Abdomen: soft, nontender; no hepatosplenomehaly, BS+; abdominal aorta nontender and not dilated by palpation. Back: no  CVA tenderness Pulses 2+ Musculoskeletal: full range of motion, normal strength, no joint deformities Extremities: no clubbing cyanosis or edema, Homan's sign negative  Neurologic: grossly nonfocal; Cranial nerves grossly wnl Psychologic: Normal mood and affect     Studies/Labs Reviewed:   Nov 01, 2022 ECG (independently read by me):  NSR at 66, LAHB, LVH, QS V1-4 c/w old AMI  September 21, 2021 ECG (independently read by me): NSR at 64, LVH, Q waves V1-6, I, aVL, no ectopy  February 20, 2021  ECG (independently read by me): NSR at 60, LAD, QS V1-6 c/w prior anterior MI; no ectopy, normal intervals  February 2021ECG (independently read by me): Normal sinus rhythm at 75 bpm.  QS complex V1 through V5, 1 and aVL with T wave inversion V3 through V6, 1 and aVL consistent with large old anterolateral lateral myocardial infarction.  No ectopy.  QTc interval 419 ms.  January 2021 ECG (independently read by me):Normal sinus rhythm at 67 bpm.  Left anterior hemiblock.  Old anterior MI with Q waves and poor R wave progression throughout the entire precordium  July 27, 2018 ECG (independently read by me): Normal sinus rhythm at 62 bpm.  Old inferior MI and anterior MI.  Left axis deviation.  No  ectopy.  QTc interval 430 ms.  March 27, 2018 ECG (independently read by me): Normal sinus rhythm at 78 bpm.  Old anterior MI with QS V1 through V5 and inferior Q waves.  No ectopy.  December 20, 2017 ECG (independently read by me): Normal sinus rhythm 70 bpm.  Old anterior MI with poor progression V1 through V5 with Q waves noted in 3 and aVF.  Normal intervals.  No ectopy.  11/18/2017 ECG (independently read by me): Sinus rhythm at 86 bpm.  Left anterior hemiblock.  QS complex V1 through V5 consistent with anterior infarct.  Inferior Q waves suggestive of inferior MI.  Normal intervals.  No ectopy.  Recent Labs:    Latest Ref Rng & Units 07/02/2022    9:21 AM 05/03/2022   10:12 AM 04/02/2022   12:21 PM  BMP   Glucose 70 - 99 mg/dL 161  096  045   BUN 8 - 27 mg/dL 26  35  34   Creatinine 0.57 - 1.00 mg/dL 4.09  8.11  9.14   BUN/Creat Ratio 12 - 28 16  20  15    Sodium 134 - 144 mmol/L 136  131  125   Potassium 3.5 - 5.2 mmol/L 5.0  5.2  5.3   Chloride 96 - 106 mmol/L 102  98  90   CO2 20 - 29 mmol/L 22  21  20    Calcium 8.7 - 10.3 mg/dL 8.9  8.8  8.7         Latest Ref Rng & Units 07/02/2022    9:21 AM 03/23/2022   12:03 PM 07/07/2021    4:51 PM  Hepatic Function  Total Protein 6.0 - 8.5 g/dL 6.6  7.3  7.0   Albumin 3.7 - 4.7 g/dL 3.6  3.9  3.2   AST 0 - 40 IU/L 16  14  14    ALT 0 - 32 IU/L 14  12  10    Alk Phosphatase 44 - 121 IU/L 97  102  59   Total Bilirubin 0.0 - 1.2 mg/dL <7.8  0.2  0.3        Latest Ref Rng & Units 07/02/2022    9:21 AM 03/23/2022   12:03 PM 07/07/2021   11:18 AM  CBC  WBC 3.4 - 10.8 x10E3/uL 6.5  7.6  9.2   Hemoglobin 11.1 - 15.9 g/dL 29.5  62.1  30.8   Hematocrit 34.0 - 46.6 % 32.8  33.8  32.1   Platelets 150 - 450 x10E3/uL 225  341  272    Lab Results  Component Value Date   MCV 86 07/02/2022   MCV 83 03/23/2022   MCV 88.9 07/07/2021   Lab Results  Component Value Date   TSH 5.350 (H) 03/23/2022   Lab Results  Component Value Date   HGBA1C 7.4 (H) 07/02/2022     BNP    Component Value Date/Time   BNP 3,381.1 (H) 06/04/2018 0754    ProBNP    Component Value Date/Time   PROBNP 5,280 (H) 08/07/2018 1247     Lipid Panel     Component Value Date/Time   CHOL 143 07/02/2022 0921   TRIG 122 07/02/2022 0921   HDL 50 07/02/2022 0921   CHOLHDL 3.3 10/06/2017 0256   VLDL 22 10/06/2017 0256   LDLCALC 71 07/02/2022 0921     RADIOLOGY: No results found.   Additional studies/ records that were reviewed today include:  I reviewed her 3-week hospitalization records in detail.  I reviewed her follow-up office visit with Sheila White.  Laboratory catheterization studies, echo Doppler assessment were reviewed.  I reviewed her  hospitalization, and follow-up office visit since my last evaluation. Subsequent evaluation since my January 2020 evaluation were reviewed.   ECHO 07/25/2019 IMPRESSIONS   1. Left ventricular ejection fraction, by visual estimation, is 25 to  30%. The left ventricle has severely decreased function. There is mildly  increased left ventricular hypertrophy.   2. Definity contrast agent was given IV to delineate the left ventricular  endocardial borders.   3. Left ventricular diastolic parameters are consistent with Grade III  diastolic dysfunction (restrictive).   4. Severely dilated left ventricular internal cavity size.   5. The left ventricle demonstrates regional wall motion abnormalities.   6. Only basal function preserved mid and apical inferior, anterior,  septum and apex severely hypokinetic.   7. Global right ventricle has normal systolic function.The right  ventricular size is normal. No increase in right ventricular wall  thickness.   8. Left atrial size was moderately dilated.   9. Right atrial size was normal.  10. Moderate calcification of the mitral valve leaflet(s).  11. Moderate mitral annular calcification.  12. Moderate thickening of the mitral valve leaflet(s).  13. The mitral valve is normal in structure. Mild mitral valve  regurgitation.  14. The tricuspid valve is normal in structure.  15. The tricuspid valve is normal in structure. Tricuspid valve  regurgitation is mild.  16. The aortic valve is tricuspid. Aortic valve regurgitation is mild.  severe sclerosis especially the non coronary cusp.  17. Pulmonic regurgitation is mild.  18. The pulmonic valve was grossly normal. Pulmonic valve regurgitation is  mild.    Echo: 08/24/2021  1. Akinesis of the distal anteroseptal wall and apex; overall moderate to  severe LV dysfunction.   2. Left ventricular ejection fraction, by estimation, is 30 to 35%. The  left ventricle has moderate to severely decreased  function. The left  ventricle demonstrates regional wall motion abnormalities (see scoring  diagram/findings for description). Left  ventricular diastolic parameters are consistent with Grade I diastolic  dysfunction (impaired relaxation). Elevated left atrial pressure.   3. Right ventricular systolic function is normal. The right ventricular  size is normal.   4. The mitral valve is normal in structure. No evidence of mitral valve  regurgitation. No evidence of mitral stenosis.   5. The aortic valve is tricuspid. Aortic valve regurgitation is trivial.  Aortic valve sclerosis is present, with no evidence of aortic valve  stenosis.   6. The inferior vena cava is normal in size with greater than 50%  respiratory variability, suggesting right atrial pressure of 3 mmHg.   ASSESSMENT:    1. STEMI involving left anterior descending coronary artery [facility]): Late presentation on October 05, 2017   2. CAD S/P percutaneous coronary angioplasty   3. Ischemic cardiomyopathy   4. Stage 3b chronic kidney disease (HCC)   5. Hyperlipidemia LDL goal <70   6. Controlled type 2 diabetes mellitus without complication, without long-term current use of insulin [facility])     PLAN:   Ms. Sheila White is a very pleasant 82 year-old female who is originally [place] and presented with a late presentation anterior ST segment elevation MI in April 2019.  She had an initial significant ischemic cardiomyopathy with an EF of 30% with grade 2 diastolic dysfunction and developed post infarct pericarditis treated with Decadron and colchicine and during her hospitalization and developed  significant hyponatremia, acute kidney injury, with ultimate improvement.  Her creatinine has improved.   She has had issues with low blood pressure and was not on an ACE or ARB therapy due to previous renal insufficiency and low blood pressure.  Remotely, she had been started on hydralazine but this had to be discontinued due to low blood  pressure.  A follow-up echo showed an EF of 30 to 35% and is concordant with her late presentation anterior MI.  Renal function ultimately stabilized and since January 2020 she was started on Entresto and has been maintained on low-dose 24/26 mg twice daily.  She was on isosorbide 30 mg, furosemide 20 mg on an as-needed basis in addition to carvedilol 6.25 mg twice a day.  She has felt significantly better since initiating Entresto and remained euvolemic and has denied any recurrent exertional dyspnea.  She is a Plavix nonresponder and has been on Brilinta since her intervention.  When I saw her in January 2021 I reduced her Brilinta dose to 60 mg twice a day per Pegasys trial data.  Her echo on July 25, 2019 continued to show severe LV dysfunction with EF of 25 to 30% and restrictive physiology grade 3 diastolic dysfunction.  Laboratory from August 20, 2019 showed further improvement in creatinine at 1.36 although her potassium was elevated and I discussed avoidance of potassium containing foods.  With her continued LV dysfunction when I saw her in February 2021 I discussed potential prophylactic ICD implantation in light of her persistent ischemic cardiomyopathy.  She was evaluated by Dr. Ladona Ridgel but the patient decided against ICD implantation. In  August 2022 she was mildly orthostatic and medications were further adjusted with reduction of her carvedilol to 3.125 mg twice a day and isosorbide to just 15 mg daily.  Subsequently, due to recurrent potassium elevation up to 5.9 on March 31, 2021, her Sherryll Burger was discontinued.  Subsequent potassium on recheck on April 03, 2021 was improved at 4.7.  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  She also is no longer on isosorbide.  Presently, she remains asymptomatic.  Most recent laboratory in January 2014 showed creatinine 1.64 with potassium 5.0.  Lipid studies were stable with total cholesterol 143, LDL cholesterol 71  triglycerides 122 and HDL 50.  Her most recent echo Doppler study did show slight improvement in LV function at 30 to 35% with findings of prior anterior MI.  Diastolic dysfunction has improved and is now grade 1 compared to previous grade 3 restrictive physiology.  She is tolerating her current medications with plans for long-term DAPT with aspirin/low-dose Brilinta, blood pressure today is stable with low-dose carvedilol.  She has a prescription for furosemide but rarely takes it.  She is euvolemic on exam.  She will have follow-up laboratory with Julieanne Manson in June.  As long as she is stable I will see her in 9 months for reevaluation or sooner as needed.   Medication Adjustments/Labs and Tests Ordered: Current medicines are reviewed at length with the patient today.  Concerns regarding medicines are outlined above.  Medication changes, Labs and Tests ordered today are listed in the Patient Instructions below.    Signed, Nicki Guadalajara, MD  11/03/2022 6:02 PM    [facility] [address], [place], [place]  [address] Phone: [phone]

## 2022-11-01 NOTE — Patient Instructions (Signed)
Medication Instructions:  NO CHANGES  *If you need a refill on your cardiac medications before your next appointment, please call your pharmacy*   Follow-Up: At [facility], you and your health needs are our priority.  As part of our continuing mission to provide you with exceptional heart care, we have created designated Provider Care Teams.  These Care Teams include your primary Cardiologist (physician) and Advanced Practice Providers (APPs -  Physician Assistants and Nurse Practitioners) who all work together to provide you with the care you need, when you need it.  We recommend signing up for the patient portal called "MyChart".  Sign up information is provided on this After Visit Summary.  MyChart is used to connect with patients for Virtual Visits (Telemedicine).  Patients are able to view lab/test results, encounter notes, upcoming appointments, etc.  Non-urgent messages can be sent to your provider as well.   To learn more about what you can do with MyChart, go to [email or web address].    Your next appointment:    December 2024

## 2022-11-01 NOTE — Telephone Encounter (Signed)
Faxed Brilinta PAP to [facility] at [phone]

## 2022-11-03 ENCOUNTER — Encounter: Payer: Self-pay | Admitting: Cardiovascular Disease

## 2022-11-29 ENCOUNTER — Ambulatory Visit: Payer: Self-pay | Admitting: Internal Medicine

## 2022-12-13 NOTE — Telephone Encounter (Signed)
Routed to covering CMA re: prescription for Brilinta PAP that will need to be signed/faxed

## 2022-12-13 NOTE — Telephone Encounter (Addendum)
Called [employer]. There is an active/valid prescription for Brilinta 60mg  #180 w/3 refills on file for patient. It has been sent on to specialty pharmacy for porcessing. Will take 10-14 business days  Can e-scribe to [facility] specialty pharmacy: [facility]  LM for daughter with this update

## 2022-12-13 NOTE — Telephone Encounter (Signed)
Pt c/o medication issue:  1. Name of Medication:   ticagrelor (BRILINTA) 60 MG TABS tablet   2. How are you currently taking this medication (dosage and times per day)?  As prescribed   3. Are you having a reaction (difficulty breathing--STAT)?  No  4. What is your medication issue?    Daughter stated [employer] wants prescription for medication faxed to fax# [phone].  Daughter stated the patient will need some samples to last until she gets the medication from the company.

## 2022-12-21 NOTE — Telephone Encounter (Signed)
Forms to [employer] were faxed today including rx for pt medication

## 2023-01-24 ENCOUNTER — Ambulatory Visit: Payer: Self-pay | Admitting: Internal Medicine

## 2023-03-21 ENCOUNTER — Encounter: Payer: Self-pay | Admitting: Internal Medicine

## 2023-03-21 ENCOUNTER — Telehealth: Payer: Self-pay

## 2023-03-21 ENCOUNTER — Ambulatory Visit: Payer: Self-pay | Admitting: Internal Medicine

## 2023-03-21 VITALS — BP 118/60 | HR 72 | Resp 16 | Ht 59.0 in | Wt 125.0 lb

## 2023-03-21 DIAGNOSIS — I10 Essential (primary) hypertension: Secondary | ICD-10-CM

## 2023-03-21 DIAGNOSIS — I255 Ischemic cardiomyopathy: Secondary | ICD-10-CM

## 2023-03-21 DIAGNOSIS — E119 Type 2 diabetes mellitus without complications: Secondary | ICD-10-CM

## 2023-03-21 DIAGNOSIS — E785 Hyperlipidemia, unspecified: Secondary | ICD-10-CM

## 2023-03-21 DIAGNOSIS — J3089 Other allergic rhinitis: Secondary | ICD-10-CM | POA: Insufficient documentation

## 2023-03-21 DIAGNOSIS — D649 Anemia, unspecified: Secondary | ICD-10-CM

## 2023-03-21 DIAGNOSIS — F0390 Unspecified dementia without behavioral disturbance: Secondary | ICD-10-CM | POA: Insufficient documentation

## 2023-03-21 DIAGNOSIS — Z23 Encounter for immunization: Secondary | ICD-10-CM

## 2023-03-21 DIAGNOSIS — N189 Chronic kidney disease, unspecified: Secondary | ICD-10-CM

## 2023-03-21 MED ORDER — DONEPEZIL HCL 5 MG PO TABS: 5.0000 mg | ORAL_TABLET | Freq: Every day | ORAL | 11 refills | Status: DC

## 2023-03-21 MED ORDER — OLOPATADINE HCL 0.2 % OP SOLN: OPHTHALMIC | 11 refills | Status: DC

## 2023-03-21 NOTE — Progress Notes (Signed)
Subjective:    Patient ID: Sheila White, female   DOB: 07/09/1940, 82 y.o.   MRN: [id number]   HPI  Daughter, Sheila White, interprets [name], Sheila White, also accompanies her today.     Ischemic cardiomyopathy and CKD:  Energy is good.  Very talkative today.  Walking 1/2 mile once weekly.  Walks around the home.  No swelling of legs.  States eating in a healthy way.  Limited salt.  No bleeding issues.  2.  Frequent clearing of throat.  Sheila White states they have not noted this at home.  She feels her eyes are watering.  No itching of eyes, but does feel her nose is itching. No sneezing.  She does have itching of her throat.  She is not using her Loratadine regularly--only once last week.  She is not using the Olapatadine.  3.  DM and Hyperlipidemia:  She is, however, nonfasting.    4.  Hypertension/CKD:  taking meds.    5. By the way:  has had loss of memory--does not remember something at times from 5 minutes ago.  Has not been able to recognize her son at times.  Is moving from one household to another due to intensity of care.  Loss of memory seems to have been gradual and not stepwise.  No ataxia of gait or urinary incontinence.  Current Meds  Medication Sig   aspirin 81 MG chewable tablet Chew 1 tablet (81 mg total) by mouth daily.   atorvastatin (LIPITOR) 80 MG tablet Take 1 tablet (80 mg total) by mouth daily.   carvedilol (COREG) 3.125 MG tablet Take 1 tablet (3.125 mg total) by mouth 2 (two) times daily.   famotidine (PEPCID) 20 MG tablet TAKE 2 TABLETS BY MOUTH AT BEDTIME   furosemide (LASIX) 20 MG tablet Take 1 tablet (20 mg total) by mouth daily as needed for fluid.   glimepiride (AMARYL) 2 MG tablet Take 1 tablet by mouth once daily with breakfast   loratadine (CLARITIN) 10 MG tablet Take 1 tablet (10 mg total) by mouth daily.   sertraline (ZOLOFT) 50 MG tablet TAKE 1/2 (ONE-HALF) TABLET BY MOUTH ONCE DAILY AT BEDTIME   ticagrelor (BRILINTA) 60 MG TABS tablet  Take 1 tablet by mouth twice daily    No Known Allergies   Review of Systems    Objective:   BP 118/60 (BP Location: Right Arm, Patient Position: Sitting, Cuff Size: Normal)   Pulse 72   Resp 16   Ht 4\' 11"  (1.499 m)   Wt 125 lb (56.7 kg)   BMI 25.25 kg/m   Physical Exam NAD HEENT:  PERRL, EOMI, conjunctivae without injection.  TMs pearly gray, posterior pharynx with mild cobbling. Neck:  Supple, No adenopathy Chest:  bibasilar dry crackles, resonant to percussion overlying area.   CV:  RRR without murmur or rub.  Radial and DP pulses normal and equal Abd:  S, NT, No HSM or mass + BS LE:  no edema.  Varicosities/spider veins. Chest:  CTA   Assessment & Plan   Cardiomyopathy:  compensated.    2.  DM:  A1C.  UrinConsider switching to Jardiance from Glimepiride.  Referral to  [facility] optometry or optometrist of choice.  Unable to get orange card as ID expired.  3.  Allergies:  to take Loratadine daily.  Refill Olopatadine eye drops as well.  Use daily.  4.  Dyslipidemia:  FLP  5.  Anemia:  CBC  6.  HM:  influenza/Fluad today.  7.  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  Discussed rhythm issues, sedation.  Return in 2 months for MMSE assessment.  TSH and Free T4

## 2023-03-21 NOTE — Telephone Encounter (Signed)
Patient needs her January  appointment to be on or after 05/21/2023 for memory evaluation.

## 2023-03-21 NOTE — Telephone Encounter (Signed)
Patient needs on appointment on or

## 2023-03-22 LAB — CBC WITH DIFFERENTIAL/PLATELET
Basophils Absolute: 0 10*3/uL (ref 0.0–0.2)
Basos: 1 %
EOS (ABSOLUTE): 0.2 10*3/uL (ref 0.0–0.4)
Eos: 4 %
Hematocrit: 34.6 % (ref 34.0–46.6)
Hemoglobin: 10.9 g/dL — ABNORMAL LOW (ref 11.1–15.9)
Immature Grans (Abs): 0 10*3/uL (ref 0.0–0.1)
Immature Granulocytes: 0 %
Lymphocytes Absolute: 1.9 10*3/uL (ref 0.7–3.1)
Lymphs: 32 %
MCH: 28.5 pg (ref 26.6–33.0)
MCHC: 31.5 g/dL (ref 31.5–35.7)
MCV: 91 fL (ref 79–97)
Monocytes Absolute: 0.5 10*3/uL (ref 0.1–0.9)
Monocytes: 8 %
Neutrophils Absolute: 3.4 10*3/uL (ref 1.4–7.0)
Neutrophils: 55 %
Platelets: 204 10*3/uL (ref 150–450)
RBC: 3.82 x10E6/uL (ref 3.77–5.28)
RDW: 13.1 % (ref 11.7–15.4)
WBC: 6.1 10*3/uL (ref 3.4–10.8)

## 2023-03-22 LAB — HGB A1C W/O EAG: Hgb A1c MFr Bld: 8.7 % — ABNORMAL HIGH (ref 4.8–5.6)

## 2023-03-22 LAB — COMPREHENSIVE METABOLIC PANEL

## 2023-03-22 LAB — LIPID PANEL W/O CHOL/HDL RATIO

## 2023-03-22 LAB — MICROALBUMIN / CREATININE URINE RATIO

## 2023-03-22 LAB — T4, FREE: Free T4: 1.21 ng/dL (ref 0.82–1.77)

## 2023-03-22 LAB — TSH: TSH: 4.82 u[IU]/mL — ABNORMAL HIGH (ref 0.450–4.500)

## 2023-03-23 LAB — COMPREHENSIVE METABOLIC PANEL
ALT: 14 IU/L (ref 0–32)
AST: 21 IU/L (ref 0–40)
Albumin: 3.9 g/dL (ref 3.7–4.7)
Alkaline Phosphatase: 85 IU/L (ref 44–121)
BUN/Creatinine Ratio: 20 (ref 12–28)
BUN: 40 mg/dL — ABNORMAL HIGH (ref 8–27)
CO2: 21 mmol/L (ref 20–29)
Calcium: 9.3 mg/dL (ref 8.7–10.3)
Chloride: 98 mmol/L (ref 96–106)
Creatinine, Ser: 1.99 mg/dL — ABNORMAL HIGH (ref 0.57–1.00)
Globulin, Total: 2.8 g/dL (ref 1.5–4.5)
Glucose: 252 mg/dL — ABNORMAL HIGH (ref 70–99)
Potassium: 5 mmol/L (ref 3.5–5.2)
Sodium: 135 mmol/L (ref 134–144)
Total Protein: 6.7 g/dL (ref 6.0–8.5)
eGFR: 24 mL/min/{1.73_m2} — ABNORMAL LOW (ref >59)

## 2023-03-23 LAB — LIPID PANEL W/O CHOL/HDL RATIO
Cholesterol, Total: 149 mg/dL (ref 100–199)
HDL: 59 mg/dL (ref >39)
LDL Chol Calc (NIH): 68 mg/dL (ref 0–99)
Triglycerides: 129 mg/dL (ref 0–149)
VLDL Cholesterol Cal: 22 mg/dL (ref 5–40)

## 2023-03-23 LAB — SPECIMEN STATUS REPORT

## 2023-03-28 ENCOUNTER — Ambulatory Visit: Payer: Self-pay | Admitting: Internal Medicine

## 2023-04-05 ENCOUNTER — Other Ambulatory Visit: Payer: Self-pay | Admitting: Internal Medicine

## 2023-04-10 MED ORDER — DAPAGLIFLOZIN PROPANEDIOL 5 MG PO TABS: 5.0000 mg | ORAL_TABLET | Freq: Every day | ORAL | 11 refills | Status: DC

## 2023-04-10 NOTE — Addendum Note (Signed)
Addended by: Marcene Duos on: 04/10/2023 12:08 AM   Modules accepted: Orders

## 2023-05-23 ENCOUNTER — Ambulatory Visit: Payer: Self-pay | Admitting: Internal Medicine

## 2023-05-30 ENCOUNTER — Encounter: Payer: Self-pay | Admitting: Cardiovascular Disease

## 2023-05-30 ENCOUNTER — Ambulatory Visit: Payer: Self-pay | Attending: Cardiovascular Disease | Admitting: Cardiovascular Disease

## 2023-05-30 VITALS — BP 124/86 | HR 63 | Ht <= 58 in | Wt 128.4 lb

## 2023-05-30 DIAGNOSIS — I255 Ischemic cardiomyopathy: Secondary | ICD-10-CM

## 2023-05-30 DIAGNOSIS — I5023 Acute on chronic systolic (congestive) heart failure: Secondary | ICD-10-CM

## 2023-05-30 DIAGNOSIS — N1832 Chronic kidney disease, stage 3b: Secondary | ICD-10-CM

## 2023-05-30 DIAGNOSIS — I2510 Atherosclerotic heart disease of native coronary artery without angina pectoris: Secondary | ICD-10-CM

## 2023-05-30 DIAGNOSIS — Z9861 Coronary angioplasty status: Secondary | ICD-10-CM

## 2023-05-30 DIAGNOSIS — I2102 ST elevation (STEMI) myocardial infarction involving left anterior descending coronary artery: Secondary | ICD-10-CM

## 2023-05-30 DIAGNOSIS — E119 Type 2 diabetes mellitus without complications: Secondary | ICD-10-CM

## 2023-05-30 MED ORDER — DAPAGLIFLOZIN PROPANEDIOL 10 MG PO TABS: ORAL_TABLET | ORAL | Status: DC

## 2023-05-30 NOTE — Patient Instructions (Addendum)
Medication Instructions:  Your physician has recommended you make the following change in your medication:   -Start farxiga 5mg  once daily before breakfast.  *If you need a refill on your cardiac medications before your next appointment, please call your pharmacy*    Testing/Procedures: Your physician has requested that you have an echocardiogram. Echocardiography is a painless test that uses sound waves to create images of your heart. It provides your doctor with information about the size and shape of your heart and how well your heart's chambers and valves are working. This procedure takes approximately one hour. There are no restrictions for this procedure. Please do NOT wear cologne, perfume, aftershave, or lotions (deodorant is allowed). Please arrive 15 minutes prior to your appointment time. This will be done at [address] [place] **To do in March 2025**  Please note: We ask at that you not bring children with you during ultrasound (echo/ vascular) testing. Due to room size and safety concerns, children are not allowed in the ultrasound rooms during exams. Our front office staff cannot provide observation of children in our lobby area while testing is being conducted. An adult accompanying a patient to their appointment will only be allowed in the ultrasound room at the discretion of the ultrasound technician under special circumstances. We apologize for any inconvenience.    Follow-Up: At [facility], you and your health needs are our priority.  As part of our continuing mission to provide you with exceptional heart care, we have created designated Provider Care Teams.  These Care Teams include your primary Cardiologist (physician) and Advanced Practice Providers (APPs -  Physician Assistants and Nurse Practitioners) who all work together to provide you with the care you need, when you need it.  We recommend signing up for the patient portal called "MyChart".  Sign up  information is provided on this After Visit Summary.  MyChart is used to connect with patients for Virtual Visits (Telemedicine).  Patients are able to view lab/test results, encounter notes, upcoming appointments, etc.  Non-urgent messages can be sent to your provider as well.   To learn more about what you can do with MyChart, go to [email or web address].    Your next appointment:   5 month(s)  Provider:   Nicki Guadalajara, MD

## 2023-05-30 NOTE — Progress Notes (Addendum)
Cardiology Office Note    Date:  05/30/2023   ID:  Sheila White, [place] [birth year], MRN [id number]  PCP:  Julieanne Manson, MD  Cardiologist:  Nicki Guadalajara, MD   6 month F/u office visit .  History of Present Illness:  Sheila White is a 82 y.o. female who is originally [place]  She presents for a 65-month follow-up evaluation.   Sheila White  presented to [facility] on October 05, 2017 with an ST segment elevation anterior myocardial infarction with late presentation.  Her symptoms had developed the day prior to admission and became more progressive with reference to shortness of breath and continued chest pain.  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  She had 50% proximal OM1 stenosis followed by 95% distal marginal stenosis and there was mild irregularity of the RCA.  She underwent successful PCI to the LAD with ultimate insertion of a 2.5 x 26 mm Resolute DES stent postdilated 2.8 proximally and 2.68 distally with a 99% stenosis reduced to 0%.  The mid LAD stenosis was treated with DES stenting with a 2 to 5 x 12 mm Resolute stent.  The apical stenosis was treated with PTCA.  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  Her peak creatinine was 3.86 which improved to 1.88 at discharge.  He was ultimately discharged on October 24, 2017 and on Oct 28, 2017 so Sheila White for her initial office evaluation.  Patient has had issues with chronic cough.  Her pericardial symptoms had improved and she was treated with colchicine.    I saw her for initial post hospital follow-up evaluation on Nov 18, 2017.  At that time she denied any recurrent chest pain symptomatology.  She was tentatively scheduled to travel to [place] several days later  and I recommended that she postpone this trip.  Since I saw her, she  developed some chest pain and was hospitalized on May 25 through Nov 22, 2017.  Troponins were normal.  Follow-up echo Doppler study showed an EF of 30 to 35%.  There was mid distal anterior, apical inferoapical severe hypokinesis secondary to her prior LAD territory infarct.  There was grade 1 diastolic dysfunction.  She also complained of some blurry vision with gait abnormality.  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  She was hyponatremic which revolved resolved.  She was seen by Sheila White post discharge on December 01, 2017.  At that time, her blood pressure was stable and pulse 77.  I saw her in June 2019.  At that time the plan was to start Plavix due to her foreign status and apparently Plavix only rather than Brilinta could be used.  However, P2Y12 test demonstrated that she was not responsive to Plavix.  As a result we have been supplying her with samples of Brilinta.   When I saw her in September 2019 her renal function was gradually improving.  She was not having any anginal symptoms and her blood pressure was stable.  I last saw her in the office in January 2020 at which time she continued to feel well.    Most recent laboratory has shown further improvement in renal function with a creatinine of 1.48 improved from 1.61 in December 2019.  She has continued to be stable.  She has been taking aspirin and Brilinta for DAPT.  She is on atorvastatin 80 mg for hyperlipidemia with target LDL less than 70.  She is unaware of any palpitations and continues to take carvedilol 6.25 mg twice a day.  She has been taking furosemide 40 mg daily without edema.  Renal function had stabilized with creatinines in the 1.4 range.  At that time, I elected to initiate low-dose Entresto in light of her continued LV dysfunction EF at 30 to 35%.  There were no signs of overload.  I recommended follow-up laboratory 2 weeks later and reassessment  with our pharmacist.  She was evaluated by Azalee Course, PA in a telemedicine visit in April 2020.  She was doing well and her Lasix dose had been reduced to allow the addition of Entresto at her prior evaluation.  Since her blood pressure had been low at the pharmacy evaluation she continues to be on the 24/26 mg twice daily regimen of Entresto.  She is a Plavix nonresponder and for this reason has been maintained on Brilinta.  I saw her in July 17, 2019 for 1 year evaluation. She continued to do well.  She specifically denied chest pain or shortness of breath.  She has noticed significant benefit with the initiation of Entresto.  She had not had recent laboratory since July 2020 and at that time creatinine was 1.55.  Lipid studies revealed a cholesterol of 158, HDL 61, LDL 77 and triglycerides 100.  During that evaluation, I recommended that she undergo a follow-up echo Doppler study to reassess LV function.  She continues to be on aspirin/Brilinta and at that time per Pegasys trial data I reduced her Brilinta dose to 60 mg twice a day.  I recommended she undergo follow-up laboratory.    Her most recent echo Doppler study of July 25, 2019 showed continued reduced LV function with EF of 25 to 30%, mild LVH and grade 3, restrictive physiology diastolic dysfunction.  She had moderate dilation of her left atrium.  There was moderate mitral annular calcification with mild MR, mild TR and mild PR.  She continued to feel well and was without anginal symptoms or significant shortness of breath.  Since her prior evaluation she has been seen by several APP's and also was evaluated by Dr. Sharrell Ku for consideration of possible implantable cardioverter defibrillator.  Ultimately, the patient decided against this and has not received therapy.  I saw her on February 20, 2021.  At time she denied any chest pain and felt she was doing well.   She denies significant dizziness but if she stands up quickly she does get  a little lightheaded.  She continues to be on aspirin and low-dose Brilinta 60 mg twice a day per Pegasys trial data.  She is on guideline directed medical therapy with carvedilol 6.25 mg twice a day, Entresto 24/26 mg twice a day, and continues to be on isosorbide 15 mg twice a day and 20 mg of furosemide which she states she only rarely takes.  She denies recent swelling, palpitations, chest pain, PND orthopnea.  She presents to the office today for evaluation and is here with her daughter as well as an interpreter.  During that evaluation, she had orthostatic hypotension with supine blood pressure 110/70 which decreased to 85/50 with standing.  As result, I recommended she reduce her isosorbide from 15 mg twice a day to just 50 mg daily and reduced her carvedilol from 6.25 mg twice a day down  to 3.125 mg twice a day. She continued to be on DAPT with aspirin/low-dose Brilinta.  Laboratory from August for not sent by me showed a creatinine of 1.42.  I recommended follow-up potassium level which was 5.1 with a creatinine of 1.50  She was seen by Fannie Knee on March 31, 2021.  Repeat laboratory at that time now showed potassium increased to 5.9.  Her Sherryll Burger was discontinued.  Apparently she was also taken off her low-dose isosorbide. Repeat laboratory on April 03, 2021 showed improvement in potassium to 4.7.  Creatinine was 1.54.  I saw her in May 04, 2021 at which time she felt well and denied any chest pain, shortness of breath, dizziness, presyncope or syncope.  She was unaware of palpitations..  She continues to be on atorvastatin 80 mg daily with most recent LDL cholesterol at 62.  She is on low-dose aspirin with Brilinta 60 mg twice a day.  She continues to be on carvedilol 3.125 mg twice a day and has a prescription for furosemide 20 mg to take as needed.  She is no longer on nitrate therapy or Entresto.  She is on on glimepiride for her diabetes mellitus.  During that evaluation, she was  euvolemic and was not needing furosemide.  I recommended a follow-up echo Doppler study for reassessment of LV function.  She underwent her echo Doppler study on August 24, 2021.  LV function remained moderate to severely depressed with EF estimated at 30 to 35% with wall motion abnormalities involving akinesis of the distal anteroseptal wall and apex.  There was grade 1 diastolic dysfunction.  There was no significant valvular abnormality.  EF was slightly improved from her prior evaluation.  I  saw her on September 21, 2021 at which time she continued to feel well.  She denied any recurrent chest pain or shortness of breath.  There were no episodes of dizziness, presyncope or syncope or palpitations.  She continues to be on DAPT with aspirin and low-dose Brilinta with her Plavix unresponsiveness.  When I last saw her on Nov 01, 2022, she  remained stable. She continues to be on aspirin/Brilinta 60 mg twice a day and is without bleeding.  She is on carvedilol 3.125 mg twice a day.  She has a prescription for furosemide 20 mg but rarely takes this.  There has been no swelling.  She is diabetic on glimepiride.  She is on atorvastatin 80 mg for hyperlipidemia.  Laboratory on July 02, 2022 showed total cholesterol 143 with LDL cholesterol 71 and triglycerides 122.  Since she has CKD and creatinine was 1.64.  She will be seeing Dr. Julieanne Manson her primary MD in the near future and will undergo complete set of laboratory.    Since I last saw her, she has establish care with [facility].  Apparently, she was given a prescription for Farxiga 5 mg cording to her daughter who is with her today, she has not begun to take this.  She continues to be on aspirin and reduced dose Brilinta 60 mg twice a day.  She is on carvedilol 3.125 mg twice a day, furosemide 20 mg as needed.  She is no longer on Entresto due to renal insufficiency.  She is diabetic on glimepiride.  She takes  sertraline 25 mg at bedtime.  She presents for evaluation.  Past Medical History:  Diagnosis Date   Acute combined systolic and diastolic CHF, NYHA class 3 (HCC) 10/10/2017   in setting  of MI   Acute cystitis without hematuria    Acute on chronic systolic congestive heart failure (HCC) 10/20/2017   Anxiety and depression 12/19/2017   Chest pain 10/31/2017   Chest pain, rule out acute myocardial infarction 11/19/2017   Coronary artery disease 10/31/2017   Dyspnea 10/05/2017   Heart failure (HCC) 12/23/2017   HOH (hard of hearing)    Ischemic cardiomyopathy 10/05/2017   MI, acute, non ST segment elevation (HCC)    Non-insulin dependent type 2 diabetes mellitus (HCC) 2004   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   STEMI involving left anterior descending coronary artery (HCC) 10/05/2017    Past Surgical History:  Procedure Laterality Date   CORONARY/GRAFT ACUTE MI REVASCULARIZATION N/A 10/05/2017   Procedure: Coronary/Graft Acute MI Revascularization;  Surgeon: Lennette Bihari, MD;  Location: [facility] INVASIVE CV [facility];  Service: Cardiovascular;  Laterality: N/A;   LEFT HEART CATH AND CORONARY ANGIOGRAPHY N/A 10/05/2017   Procedure: LEFT HEART CATH AND CORONARY ANGIOGRAPHY;  Surgeon: Lennette Bihari, MD;  Location: [facility] CV [facility];  Service: Cardiovascular;  Laterality: N/A;    Current Medications: Outpatient Medications Prior to Visit  Medication Sig Dispense Refill   aspirin 81 MG chewable tablet Chew 1 tablet (81 mg total) by mouth daily. 60 tablet 2   atorvastatin (LIPITOR) 80 MG tablet Take 1 tablet (80 mg total) by mouth daily. 30 tablet 11   carvedilol (COREG) 3.125 MG tablet Take 1 tablet by mouth twice daily 180 tablet 3   famotidine (PEPCID) 20 MG tablet TAKE 2 TABLETS BY MOUTH AT BEDTIME 120 tablet 0   furosemide (LASIX) 20 MG tablet Take 1 tablet (20 mg total) by mouth daily as needed for fluid. 30 tablet 1   glimepiride (AMARYL) 2 MG tablet Take 1 tablet by mouth once daily  with breakfast 90 tablet 3   loratadine (CLARITIN) 10 MG tablet Take 1 tablet (10 mg total) by mouth daily. 60 tablet 2   nitroGLYCERIN (NITROSTAT) 0.4 MG SL tablet Place 1 tablet (0.4 mg total) under the tongue every 5 (five) minutes x 3 doses as needed for chest pain. 25 tablet 2   Olopatadine HCl (PATADAY) 0.2 % SOLN 1 drop each eye twice daily as needed for eye allergies 2.5 mL 11   sertraline (ZOLOFT) 50 MG tablet TAKE 1/2 (ONE-HALF) TABLET BY MOUTH ONCE DAILY AT BEDTIME 15 tablet 9   ticagrelor (BRILINTA) 60 MG TABS tablet Take 1 tablet by mouth twice daily 60 tablet 3   dapagliflozin propanediol (FARXIGA) 5 MG TABS tablet Take 1 tablet (5 mg total) by mouth daily before breakfast. (Patient not taking: Reported on 05/30/2023) 30 tablet 11   donepezil (ARICEPT) 5 MG tablet Take 1 tablet (5 mg total) by mouth at bedtime. (Patient not taking: Reported on 05/30/2023) 30 tablet 11   No facility-administered medications prior to visit.     Allergies:   Patient has no known allergies.   Social History   Socioeconomic History   Marital status: Widowed    Spouse name: Not on file   Number of children: 8   Years of education: 9   Highest education level: Not on file  Occupational History   Occupation: Housewife, retired  Tobacco Use   Smoking status: Never   Smokeless tobacco: Never  Vaping Use   Vaping status: Never Used  Substance and Sexual Activity   Alcohol use: Never   Drug use: Never   Sexual activity: Not Currently  Other Topics Concern  Not on file  Social History Narrative   Living with daughter, Wray Kearns and her husband and her 3 sons.   Social Determinants of Health   Financial Resource Strain: Not on file  Food Insecurity: No Food Insecurity (08/23/2018)   Hunger Vital Sign    Worried About Running Out of Food in the Last Year: Never true    Ran Out of Food in the Last Year: Never true  Transportation Needs: No Transportation Needs (08/23/2018)   PRAPARE -  Administrator, Civil Service (Medical): No    Lack of Transportation (Non-Medical): No  Physical Activity: Not on file  Stress: Not on file  Social Connections: Unknown (08/23/2018)   Social Connection and Isolation Panel [NHANES]    Frequency of Communication with Friends and Family: More than three times a week    Frequency of Social Gatherings with Friends and Family: Not on file    Attends Religious Services: 1 to 4 times per year    Active Member of [employer] or Organizations: No    Attends Banker Meetings: Never    Marital Status: Not on file     Family History:  The patient's family history includes Alcohol abuse in her father.   ROS General: Negative; No fevers, chills, or night sweats;  HEENT: Negative; No changes in vision or hearing, sinus congestion, difficulty swallowing Pulmonary: Negative; No cough, wheezing, shortness of breath, hemoptysis Cardiovascular: See HPI GI: Obvious nausea, resolved Musculoskeletal: Negative; no myalgias, joint pain, or weakness Hematologic/Oncology: Negative; no easy bruising, bleeding Endocrine: Negative; no heat/cold intolerance; no diabetes Neuro: Negative; no changes in balance, headaches; previous dizziness resolved Skin: Negative; No rashes or skin lesions Psychiatric: Negative; No behavioral problems, depression Sleep: Negative; No snoring, daytime sleepiness, hypersomnolence, bruxism, restless legs, hypnogognic hallucinations, no cataplexy Other comprehensive 14 point system review is negative.   PHYSICAL EXAM:   VS:  BP 124/86   Pulse 63   Ht 4\' 10"  (1.473 m)   Wt 128 lb 6.4 oz (58.2 kg)   SpO2 96%   BMI 26.84 kg/m     Repeat blood pressure by me was 120/76  Wt Readings from Last 3 Encounters:  05/30/23 128 lb 6.4 oz (58.2 kg)  03/21/23 125 lb (56.7 kg)  11/01/22 122 lb (55.3 kg)    General: Alert, oriented, no distress.  Skin: normal turgor, no rashes, warm and dry HEENT: Normocephalic,  atraumatic. Pupils equal round and reactive to light; sclera anicteric; extraocular muscles intact;  Nose without nasal septal hypertrophy Mouth/Parynx benign; Mallinpatti scale 3 Neck: No JVD, no carotid bruits; normal carotid upstroke Lungs: clear to ausculatation and percussion; no wheezing or rales Chest wall: without tenderness to palpitation Heart: PMI not displaced, RRR, s1 s2 normal, 1/6 systolic murmur, no diastolic murmur, no rubs, gallops, thrills, or heaves Abdomen: soft, nontender; no hepatosplenomehaly, BS+; abdominal aorta nontender and not dilated by palpation. Back: no CVA tenderness Pulses 2+ Musculoskeletal: full range of motion, normal strength, no joint deformities Extremities: no clubbing cyanosis or edema, Homan's sign negative  Neurologic: grossly nonfocal; Cranial nerves grossly wnl Psychologic: Normal mood and affect     Studies/Labs Reviewed:   EKG Interpretation Date/Time:  Monday May 30 2023 11:35:23 EST Ventricular Rate:  62 PR Interval:  184 QRS Duration:  96 QT Interval:  438 QTC Calculation: 444 R Axis:   -67  Text Interpretation: Normal sinus rhythm Left anterior fascicular block Left ventricular hypertrophy with repolarization abnormality ( R in aVL ,  Cornell product , Romhilt-Estes ) Anteroseptal infarct , age undetermined When compared with ECG of 01-Jun-2018 01:44, PREVIOUS ECG IS PRESENT Confirmed by Nicki Guadalajara ([id number]) on 05/30/2023 11:42:49 AM    Nov 01, 2022 ECG (independently read by me):  NSR at 66, LAHB, LVH, QS V1-4 c/w old AMI  September 21, 2021 ECG (independently read by me): NSR at 64, LVH, Q waves V1-6, I, aVL, no ectopy  February 20, 2021  ECG (independently read by me): NSR at 60, LAD, QS V1-6 c/w prior anterior MI; no ectopy, normal intervals  February 2021ECG (independently read by me): Normal sinus rhythm at 75 bpm.  QS complex V1 through V5, 1 and aVL with T wave inversion V3 through V6, 1 and aVL consistent with large old  anterolateral lateral myocardial infarction.  No ectopy.  QTc interval 419 ms.  January 2021 ECG (independently read by me):Normal sinus rhythm at 67 bpm.  Left anterior hemiblock.  Old anterior MI with Q waves and poor R wave progression throughout the entire precordium  July 27, 2018 ECG (independently read by me): Normal sinus rhythm at 62 bpm.  Old inferior MI and anterior MI.  Left axis deviation.  No ectopy.  QTc interval 430 ms.  March 27, 2018 ECG (independently read by me): Normal sinus rhythm at 78 bpm.  Old anterior MI with QS V1 through V5 and inferior Q waves.  No ectopy.  December 20, 2017 ECG (independently read by me): Normal sinus rhythm 70 bpm.  Old anterior MI with poor progression V1 through V5 with Q waves noted in 3 and aVF.  Normal intervals.  No ectopy.  11/18/2017 ECG (independently read by me): Sinus rhythm at 86 bpm.  Left anterior hemiblock.  QS complex V1 through V5 consistent with anterior infarct.  Inferior Q waves suggestive of inferior MI.  Normal intervals.  No ectopy.  Recent Labs:    Latest Ref Rng & Units 03/21/2023    3:02 PM 03/21/2023   11:12 AM 07/02/2022    9:21 AM  BMP  Glucose 70 - 99 mg/dL 604  CANCELED  540   BUN 8 - 27 mg/dL 40  CANCELED  26   Creatinine 0.57 - 1.00 mg/dL 9.81  CANCELED  1.91   BUN/Creat Ratio 12 - 28 20   16    Sodium 134 - 144 mmol/L 135  CANCELED  136   Potassium 3.5 - 5.2 mmol/L 5.0  CANCELED  5.0   Chloride 96 - 106 mmol/L 98  CANCELED  102   CO2 20 - 29 mmol/L 21  CANCELED  22   Calcium 8.7 - 10.3 mg/dL 9.3  CANCELED  8.9         Latest Ref Rng & Units 03/21/2023    3:02 PM 03/21/2023   11:12 AM 07/02/2022    9:21 AM  Hepatic Function  Total Protein 6.0 - 8.5 g/dL 6.7  CANCELED  6.6   Albumin 3.7 - 4.7 g/dL 3.9  CANCELED  3.6   AST 0 - 40 IU/L 21  CANCELED  16   ALT 0 - 32 IU/L 14  CANCELED  14   Alk Phosphatase 44 - 121 IU/L 85  CANCELED  97   Total Bilirubin 0.0 - 1.2 mg/dL <4.7  CANCELED  <8.2         Latest Ref Rng & Units 03/21/2023   11:12 AM 07/02/2022    9:21 AM 03/23/2022   12:03 PM  CBC  WBC 3.4 - 10.8  x10E3/uL 6.1  6.5  7.6   Hemoglobin 11.1 - 15.9 g/dL 95.6  21.3  08.6   Hematocrit 34.0 - 46.6 % 34.6  32.8  33.8   Platelets 150 - 450 x10E3/uL 204  225  341    Lab Results  Component Value Date   MCV 91 03/21/2023   MCV 86 07/02/2022   MCV 83 03/23/2022   Lab Results  Component Value Date   TSH 4.820 (H) 03/21/2023   Lab Results  Component Value Date   HGBA1C 8.7 (H) 03/21/2023     BNP    Component Value Date/Time   BNP 3,381.1 (H) 06/04/2018 0754    ProBNP    Component Value Date/Time   PROBNP 5,280 (H) 08/07/2018 1247     Lipid Panel     Component Value Date/Time   CHOL 149 03/21/2023 1502   TRIG 129 03/21/2023 1502   HDL 59 03/21/2023 1502   CHOLHDL 3.3 10/06/2017 0256   VLDL 22 10/06/2017 0256   LDLCALC 68 03/21/2023 1502     RADIOLOGY: No results found.   Additional studies/ records that were reviewed today include:  I reviewed her 3-week hospitalization records in detail.  I reviewed her follow-up office visit with Sheila White.  Laboratory catheterization studies, echo Doppler assessment were reviewed.  I reviewed her hospitalization, and follow-up office visit since my last evaluation. Subsequent evaluation since my January 2020 evaluation were reviewed.   ECHO 07/25/2019 IMPRESSIONS   1. Left ventricular ejection fraction, by visual estimation, is 25 to  30%. The left ventricle has severely decreased function. There is mildly  increased left ventricular hypertrophy.   2. Definity contrast agent was given IV to delineate the left ventricular  endocardial borders.   3. Left ventricular diastolic parameters are consistent with Grade III  diastolic dysfunction (restrictive).   4. Severely dilated left ventricular internal cavity size.   5. The left ventricle demonstrates regional wall motion abnormalities.   6. Only basal function  preserved mid and apical inferior, anterior,  septum and apex severely hypokinetic.   7. Global right ventricle has normal systolic function.The right  ventricular size is normal. No increase in right ventricular wall  thickness.   8. Left atrial size was moderately dilated.   9. Right atrial size was normal.  10. Moderate calcification of the mitral valve leaflet(s).  11. Moderate mitral annular calcification.  12. Moderate thickening of the mitral valve leaflet(s).  13. The mitral valve is normal in structure. Mild mitral valve  regurgitation.  14. The tricuspid valve is normal in structure.  15. The tricuspid valve is normal in structure. Tricuspid valve  regurgitation is mild.  16. The aortic valve is tricuspid. Aortic valve regurgitation is mild.  severe sclerosis especially the non coronary cusp.  17. Pulmonic regurgitation is mild.  18. The pulmonic valve was grossly normal. Pulmonic valve regurgitation is  mild.    Echo: 08/24/2021  1. Akinesis of the distal anteroseptal wall and apex; overall moderate to  severe LV dysfunction.   2. Left ventricular ejection fraction, by estimation, is 30 to 35%. The  left ventricle has moderate to severely decreased function. The left  ventricle demonstrates regional wall motion abnormalities (see scoring  diagram/findings for description). Left  ventricular diastolic parameters are consistent with Grade I diastolic  dysfunction (impaired relaxation). Elevated left atrial pressure.   3. Right ventricular systolic function is normal. The right ventricular  size is normal.   4. The mitral valve is normal  in structure. No evidence of mitral valve  regurgitation. No evidence of mitral stenosis.   5. The aortic valve is tricuspid. Aortic valve regurgitation is trivial.  Aortic valve sclerosis is present, with no evidence of aortic valve  stenosis.   6. The inferior vena cava is normal in size with greater than 50%  respiratory variability,  suggesting right atrial pressure of 3 mmHg.   ASSESSMENT:    1. CAD S/P percutaneous coronary angioplasty   2. STEMI involving left anterior descending coronary artery [facility]): Late presentation on October 05, 2017   3. Ischemic cardiomyopathy   4. Acute on chronic systolic congestive heart failure (HCC)   5. Stage 3b chronic kidney disease (HCC)   6. Controlled type 2 diabetes mellitus without complication, without long-term current use of insulin [facility])     PLAN:   Ms. Sheila White is a very pleasant 82 year-old female who is originally [place] and presented with a late presentation anterior ST segment elevation MI in April 2019.  She had an initial significant ischemic cardiomyopathy with an EF of 30% with grade 2 diastolic dysfunction and developed post infarct pericarditis treated with Decadron and colchicine and during her hospitalization and developed significant hyponatremia, acute kidney injury, with ultimate improvement.  Her creatinine has improved.   She has had issues with low blood pressure and was not on an ACE or ARB therapy due to previous renal insufficiency and low blood pressure.  Remotely, she had been started on hydralazine but this had to be discontinued due to low blood pressure.  A follow-up echo showed an EF of 30 to 35% and is concordant with her late presentation anterior MI.  Renal function ultimately stabilized and since January 2020 she was started on Entresto and had been maintained on low-dose 24/26 mg twice daily.  She was on isosorbide 30 mg, furosemide 20 mg on an as-needed basis in addition to carvedilol 6.25 mg twice a day.  She has felt significantly better since initiating Entresto and remained euvolemic and has denied any recurrent exertional dyspnea.  She is a Plavix nonresponder and has been on Brilinta since her intervention.  When I saw her in January 2021 I reduced her Brilinta dose to 60 mg twice a day per Pegasys trial data.  Her echo on July 25, 2019 continued to show severe LV dysfunction with EF of 25 to 30% and restrictive physiology grade 3 diastolic dysfunction.  Laboratory from August 20, 2019 showed further improvement in creatinine at 1.36 although her potassium was elevated and I discussed avoidance of potassium containing foods.  With her continued LV dysfunction when I saw her in February 2021 I discussed potential prophylactic ICD implantation in light of her persistent ischemic cardiomyopathy.  She was evaluated by Dr. Ladona Ridgel but the patient decided against ICD implantation. In  August 2022 she was mildly orthostatic and medications were further adjusted with reduction of her carvedilol to 3.125 mg twice a day and isosorbide to just 15 mg daily.  Subsequently, due to recurrent potassium elevation up to 5.9 on March 31, 2021, her Sherryll Burger was discontinued.  Subsequent potassium on recheck on April 03, 2021 was improved at 4.7.  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  She also is no longer on isosorbide.  Her most recent echo Doppler study from August 24, 2021 did show slight improvement in LV function at 30 to 35% with findings of prior anterior MI.  Diastolic dysfunction  has improved and is now grade 1 compared to previous grade 3 restrictive physiology.  Presently, she remains asymptomatic.  She has recently establish care with Dr. Julieanne Manson at [facility].  He was advised that she start [place] and she was given a prescription for 5 mg.  She never filled the prescription.  Her blood pressure today is stable at 120/76 when checked by me.  He is no JVD.  Her lungs are clear.  Regular with rate in the low 60s.  No significant edema.  With her continued function although slightly improved, I have recommended she try initiating therapy with the Farxiga 5 mg as prescribed by Dr. Delrae Alfred.  This will be beneficial not just for slight elevation in her hemoglobin A1c but for her  ischemic cardiomyopathy and persistently reduced LV function, particularly since she is no longer on Entresto with her history of kidney insufficiency and prior hyperkalemia.  I am recommending that in 2025 she have a follow-up echo Doppler study.  I will see her in April 2025 for reevaluation.   Medication Adjustments/Labs and Tests Ordered: Current medicines are reviewed at length with the patient today.  Concerns regarding medicines are outlined above.  Medication changes, Labs and Tests ordered today are listed in the Patient Instructions below.    Signed, Nicki Guadalajara, MD  05/30/2023 1:42 PM    [facility] [address], [address], [place], [place]  [address] Phone: [phone]

## 2023-06-10 ENCOUNTER — Other Ambulatory Visit: Payer: Self-pay | Admitting: Internal Medicine

## 2023-06-14 NOTE — Telephone Encounter (Signed)
Patient was scheduled on 05/23/23 and did not show to appointment

## 2023-07-05 ENCOUNTER — Ambulatory Visit: Payer: Self-pay | Admitting: Internal Medicine

## 2023-07-14 ENCOUNTER — Other Ambulatory Visit: Payer: Self-pay

## 2023-07-14 DIAGNOSIS — E119 Type 2 diabetes mellitus without complications: Secondary | ICD-10-CM

## 2023-07-14 DIAGNOSIS — N189 Chronic kidney disease, unspecified: Secondary | ICD-10-CM

## 2023-07-15 LAB — BASIC METABOLIC PANEL
BUN/Creatinine Ratio: 17 (ref 12–28)
BUN: 34 mg/dL — ABNORMAL HIGH (ref 8–27)
CO2: 20 mmol/L (ref 20–29)
Calcium: 8.7 mg/dL (ref 8.7–10.3)
Chloride: 102 mmol/L (ref 96–106)
Creatinine, Ser: 2.01 mg/dL — ABNORMAL HIGH (ref 0.57–1.00)
Glucose: 225 mg/dL — ABNORMAL HIGH (ref 70–99)
Potassium: 4.9 mmol/L (ref 3.5–5.2)
Sodium: 138 mmol/L (ref 134–144)
eGFR: 24 mL/min/{1.73_m2} — ABNORMAL LOW (ref >59)

## 2023-07-15 LAB — HEMOGLOBIN A1C
Est. average glucose Bld gHb Est-mCnc: 255 mg/dL
Hgb A1c MFr Bld: 10.5 % — ABNORMAL HIGH (ref 4.8–5.6)

## 2023-07-21 ENCOUNTER — Other Ambulatory Visit: Payer: Self-pay

## 2023-07-21 MED ORDER — SERTRALINE HCL 50 MG PO TABS: ORAL_TABLET | ORAL | 8 refills | Status: DC

## 2023-08-10 ENCOUNTER — Telehealth: Payer: Self-pay

## 2023-08-10 NOTE — Telephone Encounter (Signed)
Patient needs an appointment for , daughter notices that when mother gets up in the morning it seems like she is about to fall, and just stares at daughter , when patient lays down she tells daughter she feels dizzy.   We will call patient if there is a cancellation.

## 2023-08-22 ENCOUNTER — Ambulatory Visit: Payer: Self-pay | Admitting: Internal Medicine

## 2023-08-22 VITALS — BP 108/60 | HR 71 | Resp 16 | Ht <= 58 in | Wt 120.0 lb

## 2023-08-22 DIAGNOSIS — F03B0 Unspecified dementia, moderate, without behavioral disturbance, psychotic disturbance, mood disturbance, and anxiety: Secondary | ICD-10-CM

## 2023-08-22 DIAGNOSIS — R42 Dizziness and giddiness: Secondary | ICD-10-CM

## 2023-08-22 DIAGNOSIS — E119 Type 2 diabetes mellitus without complications: Secondary | ICD-10-CM

## 2023-08-22 DIAGNOSIS — N189 Chronic kidney disease, unspecified: Secondary | ICD-10-CM

## 2023-08-22 DIAGNOSIS — R7989 Other specified abnormal findings of blood chemistry: Secondary | ICD-10-CM | POA: Insufficient documentation

## 2023-08-22 DIAGNOSIS — I10 Essential (primary) hypertension: Secondary | ICD-10-CM

## 2023-08-22 NOTE — Telephone Encounter (Signed)
 Patient has been scheduled

## 2023-08-22 NOTE — Progress Notes (Signed)
 Subjective:    Patient ID: Sheila White, female   DOB: Apr 15, 1941, 83 y.o.   MRN: [id number]   HPI  Sheila White is here with her mother and interprets    Dizziness with meds:  Hard to follow history with Farxiga  and donepezil , but ultimately clear they did not pick up meds when prescribed back in September.  When Sheila White was seen by Dr. Charleen in December, they were given samples of Farxiga  and Sheila White finally picked up donepezil  and started at same time.  Sheila White became dizzy after starting meds and did not feel like herself.  Stopped Farxiga  after 3 doses and a week later as she continued with some dizziness, stopped the Donepezil .   Sheila White states meds were not started in September as Sheila White's passport expired and was needed for application for Farxiga .  Sheila White was anxious regarding starting the Donepezil , so did not pick up initially.  2.  DM:  significantly worsening A1C at 10.5%.  Sheila White admits her mom has been anxious staying at her brothers' homes alone and also eating a poor diet.  Sheila White takes prepared meals to her brothers' homes and they don't make sure she eats that food.--will provide fried fatty food.  Sheila White and her sister will be taking back over her mom's care.    3.  Ischemic cardiomyopathy and CKD:  Not clear Farxiga  added alone caused dizziness.     Current Meds  Medication Sig   aspirin  81 MG chewable tablet Chew 1 tablet (81 mg total) by mouth daily.   atorvastatin  (LIPITOR ) 80 MG tablet Take 1 tablet (80 mg total) by mouth daily.   carvedilol  (COREG ) 3.125 MG tablet Take 1 tablet by mouth twice daily   famotidine  (PEPCID ) 20 MG tablet TAKE 2 TABLETS BY MOUTH AT BEDTIME   ferrous gluconate  (FERGON) 324 MG tablet Take 324 mg by mouth daily with breakfast.   furosemide  (LASIX ) 20 MG tablet Take 1 tablet (20 mg total) by mouth daily as needed for fluid.   glimepiride  (AMARYL ) 2 MG tablet Take 1 tablet by mouth once daily with breakfast   loratadine  (CLARITIN ) 10 MG  tablet Take 1 tablet (10 mg total) by mouth daily.   nitroGLYCERIN  (NITROSTAT ) 0.4 MG SL tablet Place 1 tablet (0.4 mg total) under the tongue every 5 (five) minutes x 3 doses as needed for chest pain.   Olopatadine  HCl (PATADAY ) 0.2 % SOLN 1 drop each eye twice daily as needed for eye allergies   sertraline  (ZOLOFT ) 50 MG tablet TAKE 1/2 (ONE-HALF) TABLET BY MOUTH ONCE DAILY AT BEDTIME   ticagrelor  (BRILINTA ) 60 MG TABS tablet Take 1 tablet by mouth twice daily   No Known Allergies   Review of Systems    Objective:   BP 108/60 (BP Location: Left Arm, Patient Position: Sitting, Cuff Size: Normal)   Pulse 71   Resp 16   Ht 4' 10 (1.473 m)   Wt 120 lb (54.4 kg)   SpO2 98%   BMI 25.08 kg/m   Physical Exam NAD Lungs:  CTA CV:  RRR without murmur or rub.  Radial pulses normal and equal LE:  No edema.   Assessment & Plan   Dizziness with start of Farxiga  and donepezil  at same time.  She will restart the Farxiga  from Dr. Lenoria office samples.  To apply at [facility] MAP for continuation--Rx sent previously, just needs to apply.  2.  DM:  Farxiga  as above.  Daughters taking over  her care again as diet and physical activity not great with son.  Continue glimepiride   3.  Ischemic cardiomyopathy:  compensated CHF.  Farxiga  as above.  4.  CKD:  Farxiga .  BMP  5.  Hypothyroidism:  TSH/Free T4 as TSH elevated last check.    6.  Hypertension:  controlled  7.  Dementia:  consider addition of Donepezil  if tolerating Farxiga .

## 2023-08-23 LAB — BASIC METABOLIC PANEL
BUN/Creatinine Ratio: 28 (ref 12–28)
BUN: 52 mg/dL — ABNORMAL HIGH (ref 8–27)
CO2: 20 mmol/L (ref 20–29)
Calcium: 8.9 mg/dL (ref 8.7–10.3)
Chloride: 102 mmol/L (ref 96–106)
Creatinine, Ser: 1.85 mg/dL — ABNORMAL HIGH (ref 0.57–1.00)
Glucose: 103 mg/dL — ABNORMAL HIGH (ref 70–99)
Potassium: 4.7 mmol/L (ref 3.5–5.2)
Sodium: 136 mmol/L (ref 134–144)
eGFR: 26 mL/min/{1.73_m2} — ABNORMAL LOW (ref >59)

## 2023-08-23 LAB — TSH: TSH: 5.98 u[IU]/mL — ABNORMAL HIGH (ref 0.450–4.500)

## 2023-08-23 LAB — T4, FREE: Free T4: 1.11 ng/dL (ref 0.82–1.77)

## 2023-08-24 MED ORDER — LEVOTHYROXINE SODIUM 25 MCG PO TABS: ORAL_TABLET | ORAL | 11 refills | Status: DC

## 2023-09-07 ENCOUNTER — Ambulatory Visit (HOSPITAL_COMMUNITY): Payer: Self-pay | Attending: Cardiovascular Disease

## 2023-09-07 DIAGNOSIS — I2510 Atherosclerotic heart disease of native coronary artery without angina pectoris: Secondary | ICD-10-CM | POA: Insufficient documentation

## 2023-09-07 DIAGNOSIS — I5023 Acute on chronic systolic (congestive) heart failure: Secondary | ICD-10-CM | POA: Insufficient documentation

## 2023-09-07 DIAGNOSIS — Z9861 Coronary angioplasty status: Secondary | ICD-10-CM | POA: Insufficient documentation

## 2023-09-07 DIAGNOSIS — I255 Ischemic cardiomyopathy: Secondary | ICD-10-CM | POA: Insufficient documentation

## 2023-09-07 LAB — ECHOCARDIOGRAM COMPLETE
Area-P 1/2: 3.02 cm2
Est EF: 30
S' Lateral: 3.1 cm

## 2023-09-07 MED ORDER — PERFLUTREN LIPID MICROSPHERE
1.0000 mL | INTRAVENOUS | Status: AC | PRN
  Administered 2023-09-07: 2 mL via INTRAVENOUS

## 2023-10-03 ENCOUNTER — Other Ambulatory Visit (HOSPITAL_COMMUNITY): Payer: Self-pay

## 2023-10-19 ENCOUNTER — Ambulatory Visit: Payer: Self-pay | Admitting: Internal Medicine

## 2023-10-20 ENCOUNTER — Other Ambulatory Visit: Payer: Self-pay

## 2023-10-21 ENCOUNTER — Other Ambulatory Visit: Payer: Self-pay

## 2023-10-21 DIAGNOSIS — R7989 Other specified abnormal findings of blood chemistry: Secondary | ICD-10-CM

## 2023-10-22 LAB — TSH: TSH: 5.73 u[IU]/mL — ABNORMAL HIGH (ref 0.450–4.500)

## 2023-11-10 ENCOUNTER — Telehealth: Payer: Self-pay

## 2023-11-10 ENCOUNTER — Other Ambulatory Visit (HOSPITAL_COMMUNITY): Payer: Self-pay

## 2023-11-10 ENCOUNTER — Ambulatory Visit: Payer: Self-pay | Attending: Cardiovascular Disease | Admitting: Cardiovascular Disease

## 2023-11-10 VITALS — BP 140/80 | HR 65 | Ht 59.0 in | Wt 129.8 lb

## 2023-11-10 DIAGNOSIS — I2102 ST elevation (STEMI) myocardial infarction involving left anterior descending coronary artery: Secondary | ICD-10-CM

## 2023-11-10 DIAGNOSIS — E119 Type 2 diabetes mellitus without complications: Secondary | ICD-10-CM

## 2023-11-10 DIAGNOSIS — N1832 Chronic kidney disease, stage 3b: Secondary | ICD-10-CM

## 2023-11-10 DIAGNOSIS — E785 Hyperlipidemia, unspecified: Secondary | ICD-10-CM

## 2023-11-10 DIAGNOSIS — I255 Ischemic cardiomyopathy: Secondary | ICD-10-CM

## 2023-11-10 DIAGNOSIS — Z9861 Coronary angioplasty status: Secondary | ICD-10-CM

## 2023-11-10 DIAGNOSIS — I2510 Atherosclerotic heart disease of native coronary artery without angina pectoris: Secondary | ICD-10-CM

## 2023-11-10 NOTE — Patient Instructions (Addendum)
 Medication Instructions:  No changes *If you need a refill on your cardiac medications before your next appointment, please call your pharmacy*  Lab Work: No labs If you have labs (blood work) drawn today and your tests are completely normal, you will receive your results only by: MyChart Message (if you have MyChart) OR A paper copy in the mail If you have any lab test that is abnormal or we need to change your treatment, we will call you to review the results.  Testing/Procedures: No testing  Follow-Up: At [facility], you and your health needs are our priority.  As part of our continuing mission to provide you with exceptional heart care, our providers are all part of one team.  This team includes your primary Cardiologist (physician) and Advanced Practice Providers or APPs (Physician Assistants and Nurse Practitioners) who all work together to provide you with the care you need, when you need it.  Your next appointment:   4-6 month f/u with Dr Alda Amas  We recommend signing up for the patient portal called "MyChart".  Sign up information is provided on this After Visit Summary.  MyChart is used to connect with patients for Virtual Visits (Telemedicine).  Patients are able to view lab/test results, encounter notes, upcoming appointments, etc.  Non-urgent messages can be sent to your provider as well.   To learn more about what you can do with MyChart, go to [email or web address].

## 2023-11-10 NOTE — Progress Notes (Signed)
 Cardiology Office Note    Date:  11/10/2023   ID:  Sheila White, Sheila White 1941-02-11, MRN [id number]  PCP:  Sheila Cocking, MD  Cardiologist:  Magnus Schuller, MD   5 month F/u office visit .  History of Present Illness:  Sheila White is a 83 y.o. female who is [place]  She presents for a 77-month follow-up evaluation.   Ms. Sheila White  presented to [facility] on October 05, 2017 with an ST segment elevation anterior myocardial infarction with late presentation.  Her symptoms had developed the day prior to admission and became more progressive with reference to shortness of breath and continued chest pain.  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  She had 50% proximal OM1 stenosis followed by 95% distal marginal stenosis and there was mild irregularity of the RCA.  She underwent successful PCI to the LAD with ultimate insertion of a 2.5 x 26 mm Resolute DES stent postdilated 2.8 proximally and 2.68 distally with a 99% stenosis reduced to 0%.  The mid LAD stenosis was treated with DES stenting with a 2 to 5 x 12 mm Resolute stent.  The apical stenosis was treated with PTCA.  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  Her peak creatinine was 3.86 which improved to 1.88 at discharge.  He was ultimately discharged on October 24, 2017 and on Oct 28, 2017 so Armandina Bernard for her initial office evaluation.  Patient has had issues with chronic cough.  Her pericardial symptoms had improved and she was treated with colchicine .    I saw her for initial post hospital follow-up evaluation on Nov 18, 2017.  At that time she denied any recurrent chest pain symptomatology.  She was tentatively scheduled to travel to [place]  several days later  and I recommended that she postpone this trip.  Since I saw her, she  developed some chest pain and was hospitalized on May 25 through Nov 22, 2017.  Troponins were normal.  Follow-up echo Doppler study showed an EF of 30 to 35%.  There was mid distal anterior, apical inferoapical severe hypokinesis secondary to her prior LAD territory infarct.  There was grade 1 diastolic dysfunction.  She also complained of some blurry vision with gait abnormality.  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  She was hyponatremic which revolved resolved.  She was seen by Armandina Bernard post discharge on December 01, 2017.  At that time, her blood pressure was stable and pulse 77.  I saw her in June 2019.  At that time the plan was to start Plavix  due to her foreign status and apparently Plavix  only rather than Brilinta  could be used.  However, P2Y12 test demonstrated that she was not responsive to Plavix .  As a result we have been supplying her with samples of Brilinta .   When I saw her in September 2019 her renal function was gradually improving.  She was not having any anginal symptoms and her blood pressure was stable.  I last saw her in the office in January 2020 at which time she continued to feel well.    Most recent laboratory has shown further improvement in renal function with a creatinine of 1.48 improved from 1.61 in December 2019.  She has continued to be stable.  She has been taking aspirin  and Brilinta  for DAPT.  She is on atorvastatin  80 mg for hyperlipidemia with target LDL less than 70.  She is unaware of any palpitations and continues to take carvedilol  6.25 mg twice a day.  She has been taking furosemide  40 mg daily without edema.  Renal function had stabilized with creatinines in the 1.4 range.  At that time, I elected to initiate low-dose Entresto  in light of her continued LV dysfunction EF at 30 to 35%.  There were no signs of overload.  I recommended follow-up laboratory 2 weeks later and reassessment  with our pharmacist.  She was evaluated by Sheila Heath, PA in a telemedicine visit in April 2020.  She was doing well and her Lasix  dose had been reduced to allow the addition of Entresto  at her prior evaluation.  Since her blood pressure had been low at the pharmacy evaluation she continues to be on the 24/26 mg twice daily regimen of Entresto .  She is a Plavix  nonresponder and for this reason has been maintained on Brilinta .  I saw her in July 17, 2019 for 1 year evaluation. She continued to do well.  She specifically denied chest pain or shortness of breath.  She has noticed significant benefit with the initiation of Entresto .  She had not had recent laboratory since July 2020 and at that time creatinine was 1.55.  Lipid studies revealed a cholesterol of 158, HDL 61, LDL 77 and triglycerides 100.  During that evaluation, I recommended that she undergo a follow-up echo Doppler study to reassess LV function.  She continues to be on aspirin /Brilinta  and at that time per Pegasys trial data I reduced her Brilinta  dose to 60 mg twice a day.  I recommended she undergo follow-up laboratory.    Her echo Doppler study of July 25, 2019 showed continued reduced LV function with EF of 25 to 30%, mild LVH and grade 3, restrictive physiology diastolic dysfunction.  She had moderate dilation of her left atrium.  There was moderate mitral annular calcification with mild MR, mild TR and mild PR.  She continued to feel well and was without anginal symptoms or significant shortness of breath.  Since her prior evaluation she has been seen by several APP's and also was evaluated by Dr. Jalene Mayor for consideration of possible implantable cardioverter defibrillator.  Ultimately, the patient decided against this and has not received therapy.  I saw her on February 20, 2021.  At time she denied any chest pain and felt she was doing well.   She denies significant dizziness but if she stands up quickly she does get a little  lightheaded.  She continues to be on aspirin  and low-dose Brilinta  60 mg twice a day per Pegasys trial data.  She is on guideline directed medical therapy with carvedilol  6.25 mg twice a day, Entresto  24/26 mg twice a day, and continues to be on isosorbide  15 mg twice a day and 20 mg of furosemide  which she states she only rarely takes.  She denies recent swelling, palpitations, chest pain, PND orthopnea.  She presents to the office today for evaluation and is here with her daughter as well as an interpreter.  During that evaluation, she had orthostatic hypotension with supine blood pressure 110/70 which decreased to 85/50 with standing.  As result, I recommended she reduce her isosorbide  from 15 mg twice a day to just 50 mg daily and reduced her carvedilol  from 6.25 mg twice a day down to 3.125  mg twice a day. She continued to be on DAPT with aspirin /low-dose Brilinta .  Laboratory from August for not sent by me showed a creatinine of 1.42.  I recommended follow-up potassium level which was 5.1 with a creatinine of 1.50  She was seen by Sheila Heath, PA on March 31, 2021.  Repeat laboratory at that time now showed potassium increased to 5.9.  Her Entresto  was discontinued.  Apparently she was also taken off her low-dose isosorbide . Repeat laboratory on April 03, 2021 showed improvement in potassium to 4.7.  Creatinine was 1.54.  I saw her in May 04, 2021 at which time she felt well and denied any chest pain, shortness of breath, dizziness, presyncope or syncope.  She was unaware of palpitations..  She continues to be on atorvastatin  80 mg daily with most recent LDL cholesterol at 62.  She is on low-dose aspirin  with Brilinta  60 mg twice a day.  She continues to be on carvedilol  3.125 mg twice a day and has a prescription for furosemide  20 mg to take as needed.  She is no longer on nitrate therapy or Entresto .  She is on on glimepiride  for her diabetes mellitus.  During that evaluation, she was euvolemic and  was not needing furosemide .  I recommended a follow-up echo Doppler study for reassessment of LV function.  She underwent her echo Doppler study on August 24, 2021.  LV function remained moderate to severely depressed with EF estimated at 30 to 35% with wall motion abnormalities involving akinesis of the distal anteroseptal wall and apex.  There was grade 1 diastolic dysfunction.  There was no significant valvular abnormality.  EF was slightly improved from her prior evaluation.  I saw her on September 21, 2021 at which time she continued to feel well.  She denied any recurrent chest pain or shortness of breath.  There were no episodes of dizziness, presyncope or syncope or palpitations.  She continues to be on DAPT with aspirin  and low-dose Brilinta  with her Plavix  unresponsiveness.  When I last saw her on Nov 01, 2022, she  remained stable. She continues to be on aspirin /Brilinta  60 mg twice a day and is without bleeding.  She is on carvedilol  3.125 mg twice a day.  She has a prescription for furosemide  20 mg but rarely takes this.  There has been no swelling.  She is diabetic on glimepiride .  She is on atorvastatin  80 mg for hyperlipidemia.  Laboratory on July 02, 2022 showed total cholesterol 143 with LDL cholesterol 71 and triglycerides 122.  Since she has CKD and creatinine was 1.64.  She will be seeing Dr. Ronalee White her primary MD in the near future and will undergo complete set of laboratory.    I last saw her on May 30, 2023.  Since her prior evaluation, she has establish care with [facility] Veronia [facility].  Apparently, she was given a prescription for Farxiga  5 mg according to her daughter who is with her today, she has not begun to take this.  She continues to be on aspirin  and reduced dose Brilinta  60 mg twice a day.  She is on carvedilol  3.125 mg twice a day, furosemide  20 mg as needed.  She is no longer on Entresto  due to renal insufficiency.  She is diabetic on  glimepiride .  She takes sertraline  25 mg at bedtime.   She underwent a follow-up echo Doppler study on September 07, 2023 which continued to show EF at approximately 30% with akinesis  of the mid to apical anteroseptal and inferoseptal walls as well as the apical inferior apical anterior and apical lateral wall segments consistent with her prior large LAD infarction.  She had grade 1 diastolic dysfunction.  There was mild aortic sclerosis without stenosis.  Presently, she continues to feel well.  She ran out of Farxiga  and will need to reapply to get supplies.  She continues to be on aspirin  and reduced dose Brilinta  at 60 mg twice a day, carvedilol  3.125 mg twice a day, and takes furosemide  20 mg only as needed.  She is diabetic on glimepiride  2 mg and takes levothyroxine  25 mcg for hypothyroidism.  She brought with her paperwork today for me to fill out so that hopefully she can medical support to receive Farxiga  again.  Past Medical History:  Diagnosis Date   Acute combined systolic and diastolic CHF, NYHA class 3 (HCC) 10/10/2017   in setting of MI   Acute cystitis without hematuria    Acute on chronic systolic congestive heart failure (HCC) 10/20/2017   Anxiety and depression 12/19/2017   Chest pain 10/31/2017   Chest pain, rule out acute myocardial infarction 11/19/2017   Coronary artery disease 10/31/2017   Dyspnea 10/05/2017   Heart failure (HCC) 12/23/2017   HOH (hard of hearing)    Ischemic cardiomyopathy 10/05/2017   MI, acute, non ST segment elevation (HCC)    Non-insulin  dependent type 2 diabetes mellitus (HCC) 2004   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   STEMI involving left anterior descending coronary artery (HCC) 10/05/2017    Past Surgical History:  Procedure Laterality Date   CORONARY/GRAFT ACUTE MI REVASCULARIZATION N/A 10/05/2017   Procedure: Coronary/Graft Acute MI Revascularization;  Surgeon: Millicent Ally, MD;  Location: [facility] INVASIVE CV LAB;  Service: Cardiovascular;   Laterality: N/A;   LEFT HEART CATH AND CORONARY ANGIOGRAPHY N/A 10/05/2017   Procedure: LEFT HEART CATH AND CORONARY ANGIOGRAPHY;  Surgeon: Millicent Ally, MD;  Location: [facility] INVAS[facility] CV LAB;  Service: Cardiovascular;  Laterality: N/A;    Current Medications: Outpatient Medications Prior to Visit  Medication Sig Dispense Refill   aspirin  81 MG chewable tablet Chew 1 tablet (81 mg total) by mouth daily. 60 tablet 2   atorvastatin  (LIPITOR ) 80 MG tablet Take 1 tablet (80 mg total) by mouth daily. 30 tablet 11   carvedilol  (COREG ) 3.125 MG tablet Take 1 tablet by mouth twice daily 180 tablet 3   donepezil  (ARICEPT ) 5 MG tablet Take 1 tablet (5 mg total) by mouth at bedtime. 30 tablet 11   famotidine  (PEPCID ) 20 MG tablet TAKE 2 TABLETS BY MOUTH AT BEDTIME 120 tablet 0   ferrous gluconate  (FERGON) 324 MG tablet Take 324 mg by mouth daily with breakfast.     furosemide  (LASIX ) 20 MG tablet Take 1 tablet (20 mg total) by mouth daily as needed for fluid. 30 tablet 1   glimepiride  (AMARYL ) 2 MG tablet Take 1 tablet by mouth once daily with breakfast 90 tablet 2   levothyroxine  (SYNTHROID ) 25 MCG tablet 1/2 tab by mouth daily 30 tablet 11   loratadine  (CLARITIN ) 10 MG tablet Take 1 tablet (10 mg total) by mouth daily. 60 tablet 2   nitroGLYCERIN  (NITROSTAT ) 0.4 MG SL tablet Place 1 tablet (0.4 mg total) under the tongue every 5 (five) minutes x 3 doses as needed for chest pain. 25 tablet 2   Olopatadine  HCl (PATADAY ) 0.2 % SOLN 1 drop each eye twice daily as needed for eye allergies 2.5 mL 11  sertraline  (ZOLOFT ) 50 MG tablet TAKE 1/2 (ONE-HALF) TABLET BY MOUTH ONCE DAILY AT BEDTIME 15 tablet 8   ticagrelor  (BRILINTA ) 60 MG TABS tablet Take 1 tablet by mouth twice daily 60 tablet 3   dapagliflozin  propanediol (FARXIGA ) 10 MG TABS tablet Take 5mg  (0.5 tablet) once daily before breakfast. (Patient not taking: Reported on 11/10/2023) 14 tablet    dapagliflozin  propanediol (FARXIGA ) 5 MG TABS tablet Take 1  tablet (5 mg total) by mouth daily before breakfast. (Patient not taking: Reported on 11/10/2023) 30 tablet 11   No facility-administered medications prior to visit.     Allergies:   Patient has no known allergies.   Social History   Socioeconomic History   Marital status: Widowed    Spouse name: Not on file   Number of children: 8   Years of education: 9   Highest education level: Not on file  Occupational History   Occupation: Housewife, retired  Tobacco Use   Smoking status: Never   Smokeless tobacco: Never  Vaping Use   Vaping status: Never Used  Substance and Sexual Activity   Alcohol use: Never   Drug use: Never   Sexual activity: Not Currently  Other Topics Concern   Not on file  Social History Narrative   Living with daughter, Sheila White and her husband and her 3 sons.   Social Drivers of Corporate investment banker Strain: Not on file  Food Insecurity: No Food Insecurity (08/23/2018)   Hunger Vital Sign    Worried About Running Out of Food in the Last Year: Never true    Ran Out of Food in the Last Year: Never true  Transportation Needs: No Transportation Needs (08/23/2018)   PRAPARE - Administrator, Civil Service (Medical): No    Lack of Transportation (Non-Medical): No  Physical Activity: Not on file  Stress: Not on file  Social Connections: Unknown (08/23/2018)   Social Connection and Isolation Panel [NHANES]    Frequency of Communication with Friends and Family: More than three times a week    Frequency of Social Gatherings with Friends and Family: Not on file    Attends Religious Services: 1 to 4 times per year    Active Member of [employer] or Organizations: No    Attends Banker Meetings: Never    Marital Status: Not on file     Family History:  The patient's family history includes Alcohol abuse in her father.   ROS General: Negative; No fevers, chills, or night sweats;  HEENT: Negative; No changes in vision or hearing, sinus  congestion, difficulty swallowing Pulmonary: Negative; No cough, wheezing, shortness of breath, hemoptysis Cardiovascular: See HPI GI: Obvious nausea, resolved Musculoskeletal: Negative; no myalgias, joint pain, or weakness Hematologic/Oncology: Negative; no easy bruising, bleeding Endocrine: Negative; no heat/cold intolerance; no diabetes Neuro: Negative; no changes in balance, headaches; previous dizziness resolved Skin: Negative; No rashes or skin lesions Psychiatric: Negative; No behavioral problems, depression Sleep: Negative; No snoring, daytime sleepiness, hypersomnolence, bruxism, restless legs, hypnogognic hallucinations, no cataplexy Other comprehensive 14 point system review is negative.   PHYSICAL EXAM:   VS:  BP (!) 140/80 (BP Location: Right Arm, Patient Position: Sitting, Cuff Size: Normal)   Pulse 65   Ht 4\' 11"  (1.499 m)   Wt 129 lb 12.8 oz (58.9 kg)   SpO2 98%   BMI 26.22 kg/m     Repeat blood pressure by me was 120/78  Wt Readings from Last 3 Encounters:  11/10/23  129 lb 12.8 oz (58.9 kg)  08/22/23 120 lb (54.4 kg)  05/30/23 128 lb 6.4 oz (58.2 kg)    General: Alert, oriented, no distress.  Skin: normal turgor, no rashes, warm and dry HEENT: Normocephalic, atraumatic. Pupils equal round and reactive to light; sclera anicteric; extraocular muscles intact;  Nose without nasal septal hypertrophy Mouth/Parynx benign; Mallinpatti scale 3 Neck: No JVD, no carotid bruits; normal carotid upstroke Lungs: clear to ausculatation and percussion; no wheezing or rales Chest wall: without tenderness to palpitation Heart: PMI not displaced, RRR, s1 s2 normal, 1/6 systolic murmur, no diastolic murmur, no rubs, gallops, thrills, or heaves Abdomen: soft, nontender; no hepatosplenomehaly, BS+; abdominal aorta nontender and not dilated by palpation. Back: no CVA tenderness Pulses 2+ Musculoskeletal: full range of motion, normal strength, no joint deformities Extremities: no  clubbing cyanosis or edema, Homan's sign negative  Neurologic: grossly nonfocal; Cranial nerves grossly wnl Psychologic: Normal mood and affect    Studies/Labs Reviewed:   EKG Interpretation Date/Time:  Thursday Nov 10 2023 13:29:39 EDT Ventricular Rate:  59 PR Interval:  198 QRS Duration:  98 QT Interval:  442 QTC Calculation: 437 R Axis:   93  Text Interpretation: Sinus bradycardia Septal infarct (cited on or before 30-May-2023) Lateral infarct (cited on or before 30-May-2023) When compared with ECG of 30-May-2023 11:35, Left anterior fascicular block is no longer Present Questionable change in initial forces of Lateral leads Non-specific change in ST segment in Inferior leads Confirmed by Magnus Schuller ([id number]) on 11/10/2023 2:08:42 PM     May 30, 2023 ECG (independently read by me): NSR at 62, left anterior fascicular block, LVH, old anteroseptal infarct.  K  Nov 01, 2022 ECG (independently read by me):  NSR at 66, LAHB, LVH, QS V1-4 c/w old AMI  September 21, 2021 ECG (independently read by me): NSR at 64, LVH, Q waves V1-6, I, aVL, no ectopy  February 20, 2021  ECG (independently read by me): NSR at 60, LAD, QS V1-6 c/w prior anterior MI; no ectopy, normal intervals  February 2021 ECG (independently read by me): Normal sinus rhythm at 75 bpm.  QS complex V1 through V5, 1 and aVL with T wave inversion V3 through V6, 1 and aVL consistent with large old anterolateral lateral myocardial infarction.  No ectopy.  QTc interval 419 ms.  January 2021 ECG (independently read by me):Normal sinus rhythm at 67 bpm.  Left anterior hemiblock.  Old anterior MI with Q waves and poor R wave progression throughout the entire precordium  July 27, 2018 ECG (independently read by me): Normal sinus rhythm at 62 bpm.  Old inferior MI and anterior MI.  Left axis deviation.  No ectopy.  QTc interval 430 ms.  March 27, 2018 ECG (independently read by me): Normal sinus rhythm at 78 bpm.  Old anterior  MI with QS V1 through V5 and inferior Q waves.  No ectopy.  December 20, 2017 ECG (independently read by me): Normal sinus rhythm 70 bpm.  Old anterior MI with poor progression V1 through V5 with Q waves noted in 3 and aVF.  Normal intervals.  No ectopy.  11/18/2017 ECG (independently read by me): Sinus rhythm at 86 bpm.  Left anterior hemiblock.  QS complex V1 through V5 consistent with anterior infarct.  Inferior Q waves suggestive of inferior MI.  Normal intervals.  No ectopy.  Recent Labs:    Latest Ref Rng & Units 08/22/2023   12:37 PM 07/14/2023   10:48 AM 03/21/2023  3:02 PM  BMP  Glucose 70 - 99 mg/dL 161  096  045   BUN 8 - 27 mg/dL 52  34  40   Creatinine 0.57 - 1.00 mg/dL 4.09  8.11  9.14   BUN/Creat Ratio 12 - 28 28  17  20    Sodium 134 - 144 mmol/L 136  138  135   Potassium 3.5 - 5.2 mmol/L 4.7  4.9  5.0   Chloride 96 - 106 mmol/L 102  102  98   CO2 20 - 29 mmol/L 20  20  21    Calcium  8.7 - 10.3 mg/dL 8.9  8.7  9.3         Latest Ref Rng & Units 03/21/2023    3:02 PM 03/21/2023   11:12 AM 07/02/2022    9:21 AM  Hepatic Function  Total Protein 6.0 - 8.5 g/dL 6.7  CANCELED  6.6   Albumin 3.7 - 4.7 g/dL 3.9  CANCELED  3.6   AST 0 - 40 IU/L 21  CANCELED  16   ALT 0 - 32 IU/L 14  CANCELED  14   Alk Phosphatase 44 - 121 IU/L 85  CANCELED  97   Total Bilirubin 0.0 - 1.2 mg/dL <7.8  CANCELED  <2.9        Latest Ref Rng & Units 03/21/2023   11:12 AM 07/02/2022    9:21 AM 03/23/2022   12:03 PM  CBC  WBC 3.4 - 10.8 x10E3/uL 6.1  6.5  7.6   Hemoglobin 11.1 - 15.9 g/dL 56.2  13.0  86.5   Hematocrit 34.0 - 46.6 % 34.6  32.8  33.8   Platelets 150 - 450 x10E3/uL 204  225  341    Lab Results  Component Value Date   MCV 91 03/21/2023   MCV 86 07/02/2022   MCV 83 03/23/2022   Lab Results  Component Value Date   TSH 5.730 (H) 10/21/2023   Lab Results  Component Value Date   HGBA1C 10.5 (H) 07/14/2023     BNP    Component Value Date/Time   BNP 3,381.1 (H) 06/04/2018  0754    ProBNP    Component Value Date/Time   PROBNP 5,280 (H) 08/07/2018 1247     Lipid Panel     Component Value Date/Time   CHOL 149 03/21/2023 1502   TRIG 129 03/21/2023 1502   HDL 59 03/21/2023 1502   CHOLHDL 3.3 10/06/2017 0256   VLDL 22 10/06/2017 0256   LDLCALC 68 03/21/2023 1502     RADIOLOGY: No results found.   Additional studies/ records that were reviewed today include:  I reviewed her 3-week hospitalization records in detail.  I reviewed her follow-up office visit with Armandina Bernard.  Laboratory catheterization studies, echo Doppler assessment were reviewed.  I reviewed her hospitalization, and follow-up office visit since my last evaluation. Subsequent evaluation since my January 2020 evaluation were reviewed.   ECHO 07/25/2019 IMPRESSIONS   1. Left ventricular ejection fraction, by visual estimation, is 25 to  30%. The left ventricle has severely decreased function. There is mildly  increased left ventricular hypertrophy.   2. Definity  contrast agent was given IV to delineate the left ventricular  endocardial borders.   3. Left ventricular diastolic parameters are consistent with Grade III  diastolic dysfunction (restrictive).   4. Severely dilated left ventricular internal cavity size.   5. The left ventricle demonstrates regional wall motion abnormalities.   6. Only basal function preserved mid and apical inferior,  anterior,  septum and apex severely hypokinetic.   7. Global right ventricle has normal systolic function.The right  ventricular size is normal. No increase in right ventricular wall  thickness.   8. Left atrial size was moderately dilated.   9. Right atrial size was normal.  10. Moderate calcification of the mitral valve leaflet(s).  11. Moderate mitral annular calcification.  12. Moderate thickening of the mitral valve leaflet(s).  13. The mitral valve is normal in structure. Mild mitral valve  regurgitation.  14. The tricuspid  valve is normal in structure.  15. The tricuspid valve is normal in structure. Tricuspid valve  regurgitation is mild.  16. The aortic valve is tricuspid. Aortic valve regurgitation is mild.  severe sclerosis especially the non coronary cusp.  17. Pulmonic regurgitation is mild.  18. The pulmonic valve was grossly normal. Pulmonic valve regurgitation is  mild.    Echo: 08/24/2021  1. Akinesis of the distal anteroseptal wall and apex; overall moderate to  severe LV dysfunction.   2. Left ventricular ejection fraction, by estimation, is 30 to 35%. The  left ventricle has moderate to severely decreased function. The left  ventricle demonstrates regional wall motion abnormalities (see scoring  diagram/findings for description). Left  ventricular diastolic parameters are consistent with Grade I diastolic  dysfunction (impaired relaxation). Elevated left atrial pressure.   3. Right ventricular systolic function is normal. The right ventricular  size is normal.   4. The mitral valve is normal in structure. No evidence of mitral valve  regurgitation. No evidence of mitral stenosis.   5. The aortic valve is tricuspid. Aortic valve regurgitation is trivial.  Aortic valve sclerosis is present, with no evidence of aortic valve  stenosis.   6. The inferior vena cava is normal in size with greater than 50%  respiratory variability, suggesting right atrial pressure of 3 mmHg.   ECHO: 09/07/2023  1. Left ventricular ejection fraction, by estimation, is 30%. The left  ventricle has moderate to severely decreased function. The left ventricle  demonstrates regional wall motion abnormalities. 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  There is akinesis of the mid to  apical anteroseptal and inferoseptal walls and the apical inferior, apical  anterior, and apical lateral wall segments. This is suggestive of prior  large LAD territory infarction. No LV thrombus noted. Left ventricular  diastolic parameters are consistent   with Grade I diastolic dysfunction (impaired relaxation).   2. Right ventricular systolic function is normal. The right ventricular  size is normal. Tricuspid regurgitation signal is inadequate for assessing  PA pressure.   3. The mitral valve is normal in structure. Trivial mitral valve  regurgitation. No evidence of mitral stenosis.   4. The aortic valve was not well visualized. There is mild calcification  of the aortic valve. Aortic valve regurgitation is not visualized. No  aortic stenosis is present.   5. The inferior vena cava is normal in size with greater than 50%  respiratory variability, suggesting right atrial pressure of 3 mmHg.    ASSESSMENT:    1. CAD S/P percutaneous coronary angioplasty     PLAN:   Ms. Vonette Grosso is a very pleasant 83 year-old female who [place] and presented with a late presentation anterior ST segment elevation MI in April 2019.  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  Her creatinine has improved.   She has had issues with low blood pressure and was not on an ACE or ARB therapy due to previous renal insufficiency and low blood pressure.  Remotely, she had been started on hydralazine  but this had to be discontinued due to low blood pressure.  A follow-up echo showed an EF of 30 to 35% and is concordant with her late presentation anterior MI.  Renal function ultimately stabilized and since January 2020 she was started on Entresto  and had been maintained on low-dose 24/26 mg twice daily.  She was on isosorbide  30 mg, furosemide  20 mg on an as-needed basis in addition to carvedilol  6.25 mg twice a day.  She has felt significantly better since initiating Entresto  and remained  euvolemic and has denied any recurrent exertional dyspnea.  She is a Plavix  nonresponder and has been on Brilinta  since her intervention.  When I saw her in January 2021 I reduced her Brilinta  dose to 60 mg twice a day per Pegasys trial data.  Her echo on July 25, 2019 continued to show severe LV dysfunction with EF of 25 to 30% and restrictive physiology grade 3 diastolic dysfunction.  Laboratory from August 20, 2019 showed further improvement in creatinine at 1.36 although her potassium was elevated and I discussed avoidance of potassium containing foods.  With her continued LV dysfunction when I saw her in February 2021 I discussed potential prophylactic ICD implantation in light of her persistent ischemic cardiomyopathy.  She was evaluated by Dr. Carolynne Citron but the patient decided against ICD implantation. In  August 2022 she was mildly orthostatic and medications were further adjusted with reduction of her carvedilol  to 3.125 mg twice a day and isosorbide  to just 15 mg daily.  Subsequently, due to recurrent potassium elevation up to 5.9 on March 31, 2021, her Entresto  was discontinued.  Subsequent potassium on recheck on April 03, 2021 was improved at 4.7.  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  She also is no longer on isosorbide .  Her echo Doppler study from August 24, 2021 did show slight improvement in LV function at 30 to 35% with findings of prior anterior MI.  Diastolic dysfunction has improved and is now grade 1 compared to previous grade 3 restrictive physiology.  Would last seen by me in December 2024 she was advised to start Farxiga  and had been given a prescription by Dr. Ronalee White for 5 mg dosing.  She tolerated this and when I saw her in December she was provided with additional samples.  Apparently, she has not been on Farxiga  since she now needs reapproval by the company to receive free samples.  She brought paperwork with her today and I filled  this out in the office and in addition provided her with several weeks of the 10 mg dose for which she can cut in half and take 5 mg.  She continues to be followed by Dr. Jayne Mews.   Presently, she remains fairly asymptomatic.  I reviewed her most recent echo Doppler study from September 07, 2023 which continues to show depressed LV function with EF estimated at 30% and large wall motion abnormality consistent with her late presentation LAD infarction.  She had mild grade 1 diastolic impairment with mild aortic sclerosis.  Recent TSH in April was 5.7 and her levothyroxine  dose was increased to 25 mcg.  Most recent creatinine had improved from 2.01 to1.85 in February  2025.  She continues to be on atorvastatin  80 mg daily for hyperlipidemia. She is aware of my upcoming retirement and is very appreciative of my care over these past years.  I will transition her to the care of Dr. Carson Clara and recommend follow-up in 4 to 6 months or sooner as needed.   Medication Adjustments/Labs and Tests Ordered: Current medicines are reviewed at length with the patient today.  Concerns regarding medicines are outlined above.  Medication changes, Labs and Tests ordered today are listed in the Patient Instructions below.    Signed, Magnus Schuller, MD  11/10/2023 2:07 PM    [facility] [address], [place], [place]  [address] Phone: [phone]

## 2023-11-10 NOTE — Telephone Encounter (Signed)
 Unable to pull pharmacy coverage in [facility]. Calling last dispensing pharmacy to see if they have any active coverage on file for patient. Per [facility], no active RX benefits on file for patient. Patient has been using a discount card.

## 2023-11-12 ENCOUNTER — Encounter: Payer: Self-pay | Admitting: Cardiovascular Disease

## 2023-11-14 ENCOUNTER — Telehealth: Payer: Self-pay | Admitting: Licensed Clinical Social Worker

## 2023-11-14 MED ORDER — DAPAGLIFLOZIN PROPANEDIOL 5 MG PO TABS: 5.0000 mg | ORAL_TABLET | Freq: Every day | ORAL | 3 refills | Status: DC

## 2023-11-14 MED ORDER — TICAGRELOR 60 MG PO TABS: 60.0000 mg | ORAL_TABLET | Freq: Two times a day (BID) | ORAL | 3 refills | Status: AC

## 2023-11-14 NOTE — Addendum Note (Signed)
 Addended by: Gayleen Kawasaki D on: 11/14/2023 12:44 PM   Modules accepted: Orders

## 2023-11-14 NOTE — Telephone Encounter (Signed)
 H&V Care Navigation CSW Progress Note  Clinical Social Worker contacted caregiver by phone to f/u after appt. Was informed by daughter [name] [phone]) that pt needs new script for Brilinta  for assistance program. Called [place] [employer] [place] and confirmed that was true, pt approved through 06/27/24 from previous approval. Will route to team to send in script to [facility].  Patient is participating in a Managed Medicaid Plan:  No, self pay only  SDOH Screenings   Food Insecurity: No Food Insecurity (08/23/2018)  Transportation Needs: No Transportation Needs (08/23/2018)  Social Connections: Unknown (08/23/2018)  Tobacco Use: Low Risk  (11/12/2023)    [name], MSW, LCSW Clinical Social Worker II  [facility] Heart/Vascular Care Navigation  [phone]- work cell phone (preferred)

## 2023-11-14 NOTE — Telephone Encounter (Signed)
 H&V Care Navigation CSW Progress Note  Clinical Social Worker contacted caregiver by phone to f/u after appt. Was informed by daughter [name] [phone]) that pt needs new script for Farxiga  for assistance program. Called [place] [employer] [place] and confirmed that was true, pt approved through 11/10/24 from previous approval. Last script was missing quantity. Will rouet this to refills for new script to be sent to [facility]   Patient is participating in a Managed Medicaid Plan:  No, self pay only  SDOH Screenings   Food Insecurity: No Food Insecurity (08/23/2018)  Transportation Needs: No Transportation Needs (08/23/2018)  Social Connections: Unknown (08/23/2018)  Tobacco Use: Low Risk  (11/12/2023)    [name], MSW, LCSW Clinical Social Worker II [facility] Health Heart/Vascular Care Navigation  [phone]- work cell phone (preferred)

## 2023-11-14 NOTE — Telephone Encounter (Signed)
 Pt's medication was sent to pt's pharmacy as requested. Confirmation received.

## 2023-11-14 NOTE — Addendum Note (Signed)
 Addended by: Gayleen Kawasaki D on: 11/14/2023 12:42 PM   Modules accepted: Orders

## 2023-11-14 NOTE — Progress Notes (Signed)
 Heart and Vascular Care Navigation  11/14/2023  Sheila White Sheila White 1941-04-20 [id number]  Reason for Referral: self pay; medication assistance Patient is participating in a Managed Medicaid Plan:No, self pay only  Engaged with patient by telephone for initial visit for Heart and Vascular Care Coordination.                                                                                                   Assessment:    LCSW was able to reach pt daughter today at [phone] with assistance of Spanish language interpreter Sheila White, [phone]. Pt daughter Sheila White, manages all pt health needs. Shares they live together- pt not eligible for Medicaid or Medicare at this time. She previously had applied for the [facility] and used that as eligible for assistance. They were concerned as it didn't seemed to be taken everywhere and did not reapply. LCSW shared that she has the option to apply for [employer] and [employer] Card to help with outpatient and inpatient bills. Pt daughter received a call from [employer] [place] and they shared pt needed updated scripts sent for Farxiga  and Brilinta . LCSW offered to also send [employer] application as pt may be eligible for that program as well. Pt daughter able to assist with all of these applications. No additional concerns when asked with food, transportation or housing costs at this time. Encouraged daughter to let us  know if needed.  LCSW then called [place] [employer] [place] to clarify assistance forms since Brilinta  assistance has gone away from manufacturer as of April. Was able to reach representative who confirmed pt approved for the following:  - Brilinta  approved through 06/27/2024 and she needs new script sent to [facility] -Farxiga  approved through 11/10/24 but will need new script also sent to [facility] as previous application had quantity blank.   LCSW route these concerns to refill team to complete.                                     HRT/VAS Care Coordination      Patients Home Cardiology Office --  [facility]   Outpatient Care Team Social Worker   Social Worker Name: Sheila White, [place], [phone]   Living arrangements for the past 2 months Single Family Home   Lives with: Relatives; Adult Children   Patient Current Insurance Coverage Self-Pay   Patient Has Concern With Paying Medical Bills Yes   Patient Concerns With Medical Bills self pay, not Medicaid or Medicare eligible   Medical Bill Referrals: CAFA/[employer] Card   Does Patient Have Prescription Coverage? No   Patient Prescription Assistance Programs Patient Assistance Programs   Home Assistive Devices/Equipment [name] (specify type); Dentures (specify type)   DME Agency NA   [facility] Agency [facility]       Social History:  SDOH Screenings   Food Insecurity: No Food Insecurity (11/14/2023)  Housing: Low Risk  (11/14/2023)  Transportation Needs: No Transportation Needs (11/14/2023)  Utilities: Not At Risk (11/14/2023)  Financial Resource Strain: Medium Risk (11/14/2023)  Social Connections: Unknown (08/23/2018)  Tobacco Use: Low Risk  (11/12/2023)  Health Literacy: Inadequate Health Literacy (11/14/2023)    SDOH Interventions: Financial Resources:  Surveyor, quantity Strain Interventions: Artist, Other (Comment) (patient assistance forms) Editor, commissioning for [employer] Program  Food Insecurity:  Food Insecurity Interventions: Intervention Not Indicated  Housing Insecurity:  Housing Interventions: Intervention Not Indicated  Transportation:   Transportation Interventions: Intervention Not Indicated     Other Care Navigation Interventions:     Provided Pharmacy assistance resources Patient Assistance Programs   Follow-up plan:   Noted refill team has completed needed scripts sent to [facility] for Farxiga /Brilinta . I will send CAFA and [employer] information as well as  information about how to apply with assistance of Sheila White, Artist. Will call daughter back and ensure she has received applications and medication refills.

## 2023-11-18 ENCOUNTER — Telehealth: Payer: Self-pay | Admitting: Pharmacy Technician

## 2023-11-28 ENCOUNTER — Telehealth: Payer: Self-pay | Admitting: Licensed Clinical Social Worker

## 2023-11-28 NOTE — Telephone Encounter (Signed)
 H&V Care Navigation CSW Progress Note  Clinical Social Worker contacted patient by phone to f/u on patient assistance forms and medications. Was able to reach daughter [place], [place] on file, at [phone] with assistance of Spanish language interpreter Raymona Caldwell, [phone]. Confirmed they have received applications, plan on submitting them this week. Pt also received her medications in the mail without further issues. No additional questions at this time. Remain available as needed, pt daughter confirms she has my business card.  Patient is participating in a Managed Medicaid Plan:  No, self pay only  SDOH Screenings   Food Insecurity: No Food Insecurity (11/14/2023)  Housing: Low Risk  (11/14/2023)  Transportation Needs: No Transportation Needs (11/14/2023)  Utilities: Not At Risk (11/14/2023)  Financial Resource Strain: Medium Risk (11/14/2023)  Social Connections: Unknown (08/23/2018)  Tobacco Use: Low Risk  (11/12/2023)  Health Literacy: Inadequate Health Literacy (11/14/2023)    [name], MSW, LCSW Clinical Social Worker II [facility] Health Heart/Vascular Care Navigation  [phone]- work cell phone (preferred)

## 2023-11-29 ENCOUNTER — Telehealth: Payer: Self-pay | Admitting: Licensed Clinical Social Worker

## 2023-11-29 NOTE — Telephone Encounter (Signed)
 H&V Care Navigation CSW Progress Note  Clinical Social Worker contacted caregiver by phone to f/u again on assistance applications. Forgot to mention to pt daughter that since pt is a [facility] pt that she will need to submit her applications with [name], Artist. Was able to reach pt daughter [name] (DPR on file) with assistance of Spanish language interpreter [place], [phone]. Explained how to submit and get in contact with [facility]. Pt daughter states understanding and was provided with contact information which she made note of. No additional questions at this time.  Patient is participating in a Managed Medicaid Plan:  No, self pay only  SDOH Screenings   Food Insecurity: No Food Insecurity (11/14/2023)  Housing: Low Risk  (11/14/2023)  Transportation Needs: No Transportation Needs (11/14/2023)  Utilities: Not At Risk (11/14/2023)  Financial Resource Strain: Medium Risk (11/14/2023)  Social Connections: Unknown (08/23/2018)  Tobacco Use: Low Risk  (11/12/2023)  Health Literacy: Inadequate Health Literacy (11/14/2023)   [name], MSW, LCSW Clinical Social Worker II [facility] Health Heart/Vascular Care Navigation  [phone]- work cell phone (preferred)

## 2023-12-02 ENCOUNTER — Telehealth: Payer: Self-pay | Admitting: Licensed Clinical Social Worker

## 2023-12-02 NOTE — Telephone Encounter (Signed)
 H&V Care Navigation CSW Progress Note  Clinical Social Worker contacted caregiver by phone to f/u on paperwork. Pt daughter [name] had called at the end of clinic yesterday (6/5) she speaks [place] and did not require an interpreter. Shares she went to [place] and he was able to process her applications but does not manage [employer] applications. Pt daughter planned to bring paperwork by [facility] yesterday. No paperwork when I returned to clinic today. I called [name] at [phone]. She will try and bring it by today at 2pm. Not additional questions at this time. Requested she give me a call if any issues.   Patient is participating in a Managed Medicaid Plan:  No, self pay only  SDOH Screenings   Food Insecurity: No Food Insecurity (11/14/2023)  Housing: Low Risk  (11/14/2023)  Transportation Needs: No Transportation Needs (11/14/2023)  Utilities: Not At Risk (11/14/2023)  Financial Resource Strain: Medium Risk (11/14/2023)  Social Connections: Unknown (08/23/2018)  Tobacco Use: Low Risk  (11/12/2023)  Health Literacy: Inadequate Health Literacy (11/14/2023)   [name], MSW, LCSW Clinical Social Worker II [facility] Health Heart/Vascular Care Navigation  [phone]- work cell phone (preferred)

## 2023-12-02 NOTE — Telephone Encounter (Signed)
 H&V Care Navigation CSW Progress Note  Clinical Social Worker received paperwork from pt daughter [name] to submit to [facility], securely faxed into their program. If anything missing will coordinate with pt daughter.  Patient is participating in a Managed Medicaid Plan:  No, self pay only  SDOH Screenings   Food Insecurity: No Food Insecurity (11/14/2023)  Housing: Low Risk  (11/14/2023)  Transportation Needs: No Transportation Needs (11/14/2023)  Utilities: Not At Risk (11/14/2023)  Financial Resource Strain: Medium Risk (11/14/2023)  Social Connections: Unknown (08/23/2018)  Tobacco Use: Low Risk  (11/12/2023)  Health Literacy: Inadequate Health Literacy (11/14/2023)   [name], MSW, LCSW Clinical Social Worker II [facility] Health Heart/Vascular Care Navigation  [phone]- work cell phone (preferred)

## 2023-12-16 ENCOUNTER — Other Ambulatory Visit: Payer: Self-pay

## 2023-12-16 ENCOUNTER — Telehealth: Payer: Self-pay | Admitting: Licensed Clinical Social Worker

## 2023-12-16 NOTE — Telephone Encounter (Signed)
 H&V Care Navigation CSW Progress Note  Clinical Social Worker completed chart review and note that pt was approved for CAFA/[employer] Financial Assistance at 100%-  PATIENT APPROVED FOR 100% FINANCIAL ASSISTANCE PER CAFA APPLICATION AND SUPPORTING DOCUMENTATION. CAFA APP SCANNED TO ACCOUNT [id number] APPROVAL DATES OF FPL ---- 12/01/23 - 06/01/24 APPROVAL LETTER ON ACCOUNT [id number]  +++ FPL VALID ONLY FOR 2025 PB CHRGS. 2025 HB ACCTS WILL BE PROCESSED ON AN INDIVIDUAL BASIS THRU THE MEDICAL DEBT MITIGATION PROGRAM +++   Mailed copy of approval letter.  Patient is participating in a Managed Medicaid Plan:  No, self pay, CAFA 100%  SDOH Screenings   Food Insecurity: No Food Insecurity (11/14/2023)  Housing: Low Risk  (11/14/2023)  Transportation Needs: No Transportation Needs (11/14/2023)  Utilities: Not At Risk (11/14/2023)  Financial Resource Strain: Medium Risk (11/14/2023)  Social Connections: Unknown (08/23/2018)  Tobacco Use: Low Risk  (11/12/2023)  Health Literacy: Inadequate Health Literacy (11/14/2023)    [name], MSW, LCSW Clinical Social Worker II [facility] Health Heart/Vascular Care Navigation  [phone]- work cell phone (preferred)

## 2023-12-19 ENCOUNTER — Other Ambulatory Visit: Payer: Self-pay

## 2023-12-19 DIAGNOSIS — E039 Hypothyroidism, unspecified: Secondary | ICD-10-CM

## 2023-12-19 NOTE — Telephone Encounter (Signed)
 H&V Care Navigation CSW Progress Note  Clinical Social Worker contacted [employer] to check on status of application, pt approved through 11/26/24. Will route to refill team for medications to be sent to [facility] and f/u with PCP office regarding those medications they manage.  Called pt daughter [place], [phone], no interpreter needed. Confirmed approval for [employer] and [employer], will mail information about both to home address.   Patient is participating in a Managed Medicaid Plan:  No, self pay only, CAFA 100%  SDOH Screenings   Food Insecurity: No Food Insecurity (11/14/2023)  Housing: Low Risk  (11/14/2023)  Transportation Needs: No Transportation Needs (11/14/2023)  Utilities: Not At Risk (11/14/2023)  Financial Resource Strain: Medium Risk (11/14/2023)  Social Connections: Unknown (08/23/2018)  Tobacco Use: Low Risk  (11/12/2023)  Health Literacy: Inadequate Health Literacy (11/14/2023)    Marit Lark, MSW, LCSW Clinical Social Worker II [facility] Health Heart/Vascular Care Navigation  [phone]- work cell phone (preferred)

## 2023-12-19 NOTE — Progress Notes (Signed)
 She has been taking Levothyroxine  25 mcg daily as her TSH was remaining a bit high.  May have more energy.   No palpitations or shakiness.

## 2023-12-20 ENCOUNTER — Ambulatory Visit: Payer: Self-pay | Admitting: Internal Medicine

## 2023-12-20 LAB — TSH: TSH: 4.19 u[IU]/mL (ref 0.450–4.500)

## 2023-12-22 ENCOUNTER — Ambulatory Visit: Payer: Self-pay | Admitting: Internal Medicine

## 2024-01-03 ENCOUNTER — Telehealth (HOSPITAL_BASED_OUTPATIENT_CLINIC_OR_DEPARTMENT_OTHER): Payer: Self-pay | Admitting: Licensed Clinical Social Worker

## 2024-01-03 ENCOUNTER — Telehealth: Payer: Self-pay | Admitting: Internal Medicine

## 2024-01-03 MED ORDER — ASPIRIN 81 MG PO CHEW
81.0000 mg | CHEWABLE_TABLET | Freq: Every day | ORAL | 3 refills | Status: AC
  Filled 2024-01-03: qty 90, 90d supply, fill #0

## 2024-01-03 MED ORDER — GLIMEPIRIDE 2 MG PO TABS
2.0000 mg | ORAL_TABLET | Freq: Every day | ORAL | 3 refills | Status: AC
  Filled 2024-01-03 – 2024-02-29 (×2): qty 90, 90d supply, fill #0
  Filled 2024-03-12: qty 30, 30d supply, fill #0
  Filled 2024-03-12: qty 90, 90d supply, fill #0
  Filled 2024-04-10: qty 30, 30d supply, fill #1
  Filled 2024-05-10: qty 90, 90d supply, fill #2
  Filled 2024-07-26 (×2): qty 90, 90d supply, fill #3

## 2024-01-03 MED ORDER — CARVEDILOL 3.125 MG PO TABS
3.1250 mg | ORAL_TABLET | Freq: Two times a day (BID) | ORAL | 3 refills | Status: DC
  Filled 2024-01-03: qty 180, 90d supply, fill #0

## 2024-01-03 MED ORDER — OLOPATADINE HCL 0.2 % OP SOLN
1.0000 [drp] | Freq: Every day | OPHTHALMIC | 3 refills | Status: AC | PRN
  Filled 2024-01-03 – 2024-05-10 (×4): qty 2.5, 25d supply, fill #0

## 2024-01-03 MED ORDER — NITROGLYCERIN 0.4 MG SL SUBL
0.4000 mg | SUBLINGUAL_TABLET | SUBLINGUAL | 1 refills | Status: DC | PRN
  Filled 2024-01-03 – 2024-02-29 (×2): qty 25, 15d supply, fill #0

## 2024-01-03 MED ORDER — LEVOTHYROXINE SODIUM 25 MCG PO TABS
12.5000 ug | ORAL_TABLET | Freq: Every day | ORAL | 3 refills | Status: DC
  Filled 2024-01-03: qty 45, 90d supply, fill #0

## 2024-01-03 MED ORDER — ATORVASTATIN CALCIUM 80 MG PO TABS
80.0000 mg | ORAL_TABLET | Freq: Every day | ORAL | 3 refills | Status: DC
  Filled 2024-01-03: qty 90, 90d supply, fill #0

## 2024-01-03 MED ORDER — SERTRALINE HCL 50 MG PO TABS
25.0000 mg | ORAL_TABLET | Freq: Every evening | ORAL | 3 refills | Status: DC
  Filled 2024-01-03: qty 45, 90d supply, fill #0

## 2024-01-03 MED ORDER — DONEPEZIL HCL 5 MG PO TABS
5.0000 mg | ORAL_TABLET | Freq: Every day | ORAL | 3 refills | Status: DC
  Filled 2024-01-03 – 2024-02-29 (×2): qty 90, 90d supply, fill #0
  Filled 2024-03-12: qty 30, 30d supply, fill #0
  Filled 2024-03-12: qty 90, 90d supply, fill #0

## 2024-01-03 MED ORDER — DAPAGLIFLOZIN PROPANEDIOL 5 MG PO TABS
5.0000 mg | ORAL_TABLET | Freq: Every day | ORAL | 3 refills | Status: DC
  Filled 2024-01-03: qty 90, 90d supply, fill #0

## 2024-01-03 NOTE — Telephone Encounter (Signed)
 H&V Care Navigation CSW Progress Note  Clinical Social Worker contacted caregiver by phone to f/u on medication assistance. Was able to leave a message for pt daughter Raoul, only number on file, at [phone]. [name] speaks english, returned my call, I shared that I had sent a list of medications to pt PCP clinic for [facility] but note things havent been sent. Pt daughter will f/u with clinic and provide my number if any additional questions.  Patient is participating in a Managed Medicaid Plan:  No, self pay only  SDOH Screenings   Food Insecurity: No Food Insecurity (11/14/2023)  Housing: Low Risk  (11/14/2023)  Transportation Needs: No Transportation Needs (11/14/2023)  Utilities: Not At Risk (11/14/2023)  Financial Resource Strain: Medium Risk (11/14/2023)  Social Connections: Unknown (08/23/2018)  Tobacco Use: Low Risk  (11/12/2023)  Health Literacy: Inadequate Health Literacy (11/14/2023)    Marit Lark, MSW, LCSW Clinical Social Worker II [facility] Health Heart/Vascular Care Navigation  [phone]- work cell phone (preferred)

## 2024-01-03 NOTE — Telephone Encounter (Signed)
 Patient's daughter called to change pharmacies, daughter was told to call doctor's office to approve and move all medications to [facility] with address [address].   Patient's daughter states pharmacist stated that she had sent doctor a request for medications but is waiting on approval.

## 2024-01-04 ENCOUNTER — Other Ambulatory Visit (HOSPITAL_COMMUNITY): Payer: Self-pay

## 2024-01-04 ENCOUNTER — Other Ambulatory Visit: Payer: Self-pay

## 2024-01-05 NOTE — Telephone Encounter (Signed)
 Called patient and spoke with patient's daughter Sheila White ,  Daughter states patient is getting Farxiga  and Brilinta  through a program that patient's cardiologist provided and she can not remember the name of program , daughter also stated that patient applied for another program that is called [employer] and was approved for the other medications that patient receives to go to the [facility].

## 2024-01-05 NOTE — Telephone Encounter (Signed)
 Got it.

## 2024-01-06 ENCOUNTER — Telehealth: Payer: Self-pay | Admitting: Internal Medicine

## 2024-01-10 NOTE — Telephone Encounter (Signed)
 Can you call Sheila White and find out where she wants these sent?  I am happy to send to [employer] and need to know what address to have them delivered. Have they made the application to [employer] through Cardiology?   If not, they will need to go online and do so.   Call if do not understand how to apply

## 2024-01-13 ENCOUNTER — Other Ambulatory Visit (HOSPITAL_COMMUNITY): Payer: Self-pay

## 2024-01-16 ENCOUNTER — Telehealth: Payer: Self-pay | Admitting: Pharmacy Technician

## 2024-01-24 ENCOUNTER — Telehealth: Payer: Self-pay | Admitting: Licensed Clinical Social Worker

## 2024-01-24 NOTE — Telephone Encounter (Signed)
 H&V Care Navigation CSW Progress Note  Clinical Social Worker contacted PCP clinic to f/u again on medication assistance approval and for medications to be sent e-scribe to [employer] [facility] [place] program. Unclear if this has been done but note no updates on Epic. Have sent Erminio, office staff, and Dr. Adella several messages, left voicemail today with reminder and letting them know to reach out to me if any questions.  Patient is participating in a Managed Medicaid Plan:  No, self pay only, 100% CAFA  SDOH Screenings   Food Insecurity: No Food Insecurity (11/14/2023)  Housing: Low Risk  (11/14/2023)  Transportation Needs: No Transportation Needs (11/14/2023)  Utilities: Not At Risk (11/14/2023)  Financial Resource Strain: Medium Risk (11/14/2023)  Social Connections: Unknown (08/23/2018)  Tobacco Use: Low Risk  (11/12/2023)  Health Literacy: Inadequate Health Literacy (11/14/2023)    Marit Lark, MSW, LCSW Clinical Social Worker II [facility] Health Heart/Vascular Care Navigation  [phone]- work cell phone (preferred)  '

## 2024-02-29 ENCOUNTER — Telehealth: Payer: Self-pay | Admitting: Licensed Clinical Social Worker

## 2024-02-29 ENCOUNTER — Other Ambulatory Visit (HOSPITAL_COMMUNITY): Payer: Self-pay

## 2024-02-29 ENCOUNTER — Other Ambulatory Visit: Payer: Self-pay

## 2024-02-29 ENCOUNTER — Telehealth: Payer: Self-pay

## 2024-02-29 MED ORDER — LORATADINE 10 MG PO TABS: 10.0000 mg | ORAL_TABLET | Freq: Every day | ORAL | 2 refills | Status: DC

## 2024-02-29 MED ORDER — LEVOTHYROXINE SODIUM 25 MCG PO TABS: 12.5000 ug | ORAL_TABLET | Freq: Every day | ORAL | 3 refills | Status: AC

## 2024-02-29 MED ORDER — CARVEDILOL 3.125 MG PO TABS: 3.1250 mg | ORAL_TABLET | Freq: Two times a day (BID) | ORAL | 3 refills | Status: DC

## 2024-02-29 MED ORDER — ATORVASTATIN CALCIUM 80 MG PO TABS: 80.0000 mg | ORAL_TABLET | Freq: Every day | ORAL | 3 refills | Status: AC

## 2024-02-29 MED ORDER — SERTRALINE HCL 50 MG PO TABS: 25.0000 mg | ORAL_TABLET | Freq: Every evening | ORAL | 3 refills | Status: AC

## 2024-02-29 NOTE — Telephone Encounter (Signed)
 H&V Care Navigation CSW Progress Note  Clinical Social Worker received a call from pt daughter [name],who manages pt care to inquire about medication assistance. Note that no medications had been sent from PCP as requested during previous calls in July. LCSW again called [facility] and spoke with Viki, CMA and made request again that PCP send Atorvastatin , Carvedilol , Levothyroxine , Loratadine  and Sertraline  in to [employer] [facility] [place]. She will work on this.   Camellia, CMA at [facility], was able to assist by calling in Furosemide  and Aspirin  refill to [facility] since it was not sent in July when requested.   I also messaged [facility] and requested refills of Donepezil , Ferrous Gluconate , Glimepiride , Nitroglycerin , Olopatadine .   Pt gets Brilinta  and Farxiga  from pharmaceutical company patient assistance programs.   Patient is participating in a Managed Medicaid Plan:  No, self pay only  SDOH Screenings   Food Insecurity: No Food Insecurity (11/14/2023)  Housing: Low Risk  (11/14/2023)  Transportation Needs: No Transportation Needs (11/14/2023)  Utilities: Not At Risk (11/14/2023)  Financial Resource Strain: Medium Risk (11/14/2023)  Social Connections: Unknown (08/23/2018)  Tobacco Use: Low Risk  (11/12/2023)  Health Literacy: Inadequate Health Literacy (11/14/2023)    Marit Lark, MSW, LCSW Clinical Social Worker II [facility] Health Heart/Vascular Care Navigation  [phone]- work cell phone (preferred)

## 2024-03-02 NOTE — Telephone Encounter (Signed)
 Per CMA patient contacted patient and medications have been sent

## 2024-03-06 ENCOUNTER — Other Ambulatory Visit: Payer: Self-pay | Admitting: Internal Medicine

## 2024-03-09 ENCOUNTER — Other Ambulatory Visit (HOSPITAL_COMMUNITY): Payer: Self-pay

## 2024-03-12 ENCOUNTER — Encounter: Payer: Self-pay | Admitting: Cardiology

## 2024-03-12 ENCOUNTER — Ambulatory Visit: Payer: Self-pay | Attending: Cardiology | Admitting: Cardiology

## 2024-03-12 ENCOUNTER — Other Ambulatory Visit (HOSPITAL_COMMUNITY): Payer: Self-pay

## 2024-03-12 VITALS — BP 139/70 | HR 72 | Ht 59.0 in | Wt 128.6 lb

## 2024-03-12 DIAGNOSIS — I10 Essential (primary) hypertension: Secondary | ICD-10-CM

## 2024-03-12 DIAGNOSIS — I2510 Atherosclerotic heart disease of native coronary artery without angina pectoris: Secondary | ICD-10-CM

## 2024-03-12 DIAGNOSIS — I5042 Chronic combined systolic (congestive) and diastolic (congestive) heart failure: Secondary | ICD-10-CM

## 2024-03-12 DIAGNOSIS — I2102 ST elevation (STEMI) myocardial infarction involving left anterior descending coronary artery: Secondary | ICD-10-CM

## 2024-03-12 DIAGNOSIS — E785 Hyperlipidemia, unspecified: Secondary | ICD-10-CM

## 2024-03-12 NOTE — Patient Instructions (Signed)
 Medication Instructions:  Your physician recommends that you continue on your current medications as directed. Please refer to the Current Medication list given to you today.  *If you need a refill on your cardiac medications before your next appointment, please call your pharmacy*  Lab Work: Bmet, cbc, lipid panel, A1c today If you have labs (blood work) drawn today and your tests are completely normal, you will receive your results only by: MyChart Message (if you have MyChart) OR A paper copy in the mail If you have any lab test that is abnormal or we need to change your treatment, we will call you to review the results.  Testing/Procedures: none  Follow-Up: At [facility], you and your health needs are our priority.  As part of our continuing mission to provide you with exceptional heart care, our providers are all part of one team.  This team includes your primary Cardiologist (physician) and Advanced Practice Providers or APPs (Physician Assistants and Nurse Practitioners) who all work together to provide you with the care you need, when you need it.  Your next appointment:   6 months  Provider:   Dr. Kate  We recommend signing up for the patient portal called MyChart.  Sign up information is provided on this After Visit Summary.  MyChart is used to connect with patients for Virtual Visits (Telemedicine).  Patients are able to view lab/test results, encounter notes, upcoming appointments, etc.  Non-urgent messages can be sent to your provider as well.   To learn more about what you can do with MyChart, go to [email or web address].   Other Instructions NONE

## 2024-03-12 NOTE — Progress Notes (Unsigned)
 Cardiology Office Note:    Date:  03/13/2024   ID:  Sheila White, [name] [birth year], MRN [id number]  PCP:  Adella Norris, MD  Cardiologist:  Debby Sor, MD (Inactive)  Electrophysiologist:  None   Referring MD: Adella Norris, MD   Chief Complaint  Patient presents with   Coronary Artery Disease    History of Present Illness:    Sheila White is a 83 y.o. female with a hx of CAD, chronic combined heart failure, T2DM who presents for follow-up.  She was admitted 09/2017 with late presentation of anterior STEMI.  LHC showed 99% proximal LAD stenosis, 90% mid LAD, 99% apical LAD, 50% proximal OM1, 95% distal OM.  She underwent PCI to LAD with DES to proximal and mid lesions, with PTCA to the apical lesion.  Her course was complicated by pericarditis and AKI, creatinine peaked at 3.86.  Initial echocardiogram showed EF 30%.  Improved to 1.88 on discharge.  Most recent echocardiogram 08/2023 showed EF 30%, aneurysmal apex, no significant valvular disease.  Since last clinic visit, she reports she is doing okay.  Does report some pain in her chest when she coughs.  Denies any exertional chest pain.  Reports exertion has been limited by balance issues.  Denies any shortness of breath.  She has not needed to use her Lasix .  Past Medical History:  Diagnosis Date   Acute combined systolic and diastolic CHF, NYHA class 3 (HCC) 10/10/2017   in setting of MI   Acute cystitis without hematuria    Acute on chronic systolic congestive heart failure (HCC) 10/20/2017   Anxiety and depression 12/19/2017   Chest pain 10/31/2017   Chest pain, rule out acute myocardial infarction 11/19/2017   Coronary artery disease 10/31/2017   Dyspnea 10/05/2017   Heart failure (HCC) 12/23/2017   HOH (hard of hearing)    Ischemic cardiomyopathy 10/05/2017   MI, acute, non ST segment elevation (HCC)    Non-insulin  dependent type 2 diabetes mellitus (HCC) 2004   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   STEMI involving left anterior descending coronary artery (HCC) 10/05/2017    Past Surgical History:  Procedure Laterality Date   CORONARY/GRAFT ACUTE MI REVASCULARIZATION N/A 10/05/2017   Procedure: Coronary/Graft Acute MI Revascularization;  Surgeon: Sor Debby LABOR, MD;  [facility];  Service: Cardiovascular;  Laterality: N/A;   LEFT HEART CATH AND CORONARY ANGIOGRAPHY N/A 10/05/2017   Procedure: LEFT HEART CATH AND CORONARY ANGIOGRAPHY;  Surgeon: Sor Debby LABOR, MD;  Location: [facility] INVASIVE CV LAB;  Service: Cardiovascular;  Laterality: N/A;    Current Medications: Current Meds  Medication Sig   aspirin  81 MG chewable tablet Chew 1 tablet (81 mg total) by mouth daily.   atorvastatin  (LIPITOR ) 80 MG tablet Take 1 tablet (80 mg total) by mouth daily.   carvedilol  (COREG ) 3.125 MG tablet Take 1 tablet (3.125 mg total) by mouth 2 (two) times daily.   dapagliflozin  propanediol (FARXIGA ) 5 MG TABS tablet Take 1 tablet (5 mg total) by mouth daily before breakfast.   donepezil  (ARICEPT ) 5 MG tablet Take 1 tablet (5 mg total) by mouth at bedtime.   ferrous gluconate  (FERGON) 324 MG tablet Take 324 mg by mouth daily with breakfast.   furosemide  (LASIX ) 20 MG tablet Take 1 tablet (20 mg total) by mouth daily as needed for fluid.   glimepiride  (AMARYL ) 2 MG tablet Take 1 tablet (2 mg total) by mouth daily with breakfast.   levothyroxine  (SYNTHROID )  25 MCG tablet Take 1/2 tablet (12.5 mcg total) by mouth daily.   loratadine  (CLARITIN ) 10 MG tablet Take 1 tablet (10 mg total) by mouth daily.   nitroGLYCERIN  (NITROSTAT ) 0.4 MG SL tablet Place 1 tablet (0.4 mg total) under the tongue every 5 (five) minutes x 3 doses as needed for chest pain.   Olopatadine  HCl (PATADAY ) 0.2 % SOLN Place 1 drop into both eyes daily as needed for eye allergies .   sertraline  (ZOLOFT ) 50 MG tablet Take 1/2 tablet (25 mg total) by mouth at bedtime.   ticagrelor  (BRILINTA ) 60 MG  TABS tablet Take 1 tablet (60 mg total) by mouth 2 (two) times daily.     Allergies:   Patient has no known allergies.   Social History   Socioeconomic History   Marital status: Widowed    Spouse name: Not on file   Number of children: 8   Years of education: 9   Highest education level: Not on file  Occupational History   Occupation: Housewife, retired  Tobacco Use   Smoking status: Never   Smokeless tobacco: Never  Vaping Use   Vaping status: Never Used  Substance and Sexual Activity   Alcohol use: Never   Drug use: Never   Sexual activity: Not Currently  Other Topics Concern   Not on file  Social History Narrative   Living with daughter, Raoul Breed and her husband and her 3 sons.   Social Drivers of Health   Financial Resource Strain: Medium Risk (11/14/2023)   Overall Financial Resource Strain (CARDIA)    Difficulty of Paying Living Expenses: Somewhat hard  Food Insecurity: No Food Insecurity (11/14/2023)   Hunger Vital Sign    Worried About Running Out of Food in the Last Year: Never true    Ran Out of Food in the Last Year: Never true  Transportation Needs: No Transportation Needs (11/14/2023)   PRAPARE - Administrator, Civil Service (Medical): No    Lack of Transportation (Non-Medical): No  Physical Activity: Not on file  Stress: Not on file  Social Connections: Unknown (08/23/2018)   Social Connection and Isolation Panel    Frequency of Communication with Friends and Family: More than three times a week    Frequency of Social Gatherings with Friends and Family: Not on file    Attends Religious Services: 1 to 4 times per year    Active Member of [employer] or Organizations: No    Attends Banker Meetings: Never    Marital Status: Not on file     Family History: The patient's family history includes Alcohol abuse in her father.  ROS:   Please see the history of present illness.     All other systems reviewed and are  negative.  EKGs/Labs/Other Studies Reviewed:    The following studies were reviewed today:    EKG:   03/12/2024: Normal sinus rhythm, rate 69, LVH, anterior/lateral Q waves  Recent Labs: 03/21/2023: ALT 14; Hemoglobin 10.9; Platelets 204 08/22/2023: BUN 52; Creatinine, Ser 1.85; Potassium 4.7; Sodium 136 12/19/2023: TSH 4.190  Recent Lipid Panel    Component Value Date/Time   CHOL 149 03/21/2023 1502   TRIG 129 03/21/2023 1502   HDL 59 03/21/2023 1502   CHOLHDL 3.3 10/06/2017 0256   VLDL 22 10/06/2017 0256   LDLCALC 68 03/21/2023 1502    Physical Exam:    VS:  BP 139/70   Pulse 72   Ht 4' 11 (1.499 m)  Wt 128 lb 9.6 oz (58.3 kg)   SpO2 97%   BMI 25.97 kg/m     Wt Readings from Last 3 Encounters:  03/12/24 128 lb 9.6 oz (58.3 kg)  11/10/23 129 lb 12.8 oz (58.9 kg)  08/22/23 120 lb (54.4 kg)     GEN:   in no acute distress HEENT: Normal NECK: No JVD; No carotid bruits CARDIAC: RRR, no murmurs, rubs, gallops RESPIRATORY:  Clear to auscultation without rales, wheezing or rhonchi  ABDOMEN: Soft, non-tender, non-distended MUSCULOSKELETAL:  No edema; No deformity  SKIN: Warm and dry NEUROLOGIC:  Alert and oriented  PSYCHIATRIC:  Normal affect   ASSESSMENT:    1. Coronary artery disease involving native coronary artery of native heart without angina pectoris   2. Chronic combined systolic and diastolic CHF, NYHA class 2 (HCC)   3. Essential hypertension   4. Hyperlipidemia, unspecified hyperlipidemia type    PLAN:    CAD: She was admitted 09/2017 with late presentation of anterior STEMI.  LHC showed 99% proximal LAD stenosis, 90% mid LAD, 99% apical LAD, 50% proximal OM1, 95% distal OM.  She underwent PCI to LAD with DES to proximal and mid lesions, with PTCA to the apical lesion.  Her course was complicated by pericarditis and AKI, creatinine peaked at 3.86.  Initial echocardiogram showed EF 30%.  Improved to 1.88 on discharge.  Most recent echocardiogram 08/2023  showed EF 30%, aneurysmal apex, no significant valvular disease. - Continue aspirin , ticagrelor  - Continue carvedilol  3.125 mg twice daily - Continue atorvastatin  80 mg daily  Chronic combined heart failure: Echocardiogram at time of STEMI in 2019 showed EF 30%. Most recent echocardiogram 08/2023 showed EF 30%, aneurysmal apex, no significant valvular disease. - Previously evaluated for ICD but patient declined.  Given age, would not pursue ICD - Continue carvedilol  3.125 mg twice daily - Continue Farxiga  5 mg daily - Continue Lasix  20 mg daily as needed.  She appears euvolemic - GDMT has been limited by renal function and soft BP.  Previously on hydralazine  and Imdur  but had to be discontinued due to low BP.  Entresto  discontinued due to hyperkalemia  Hyperlipidemia: On atorvastatin  80 mg daily.  Update lipid panel  T2DM: On Farxiga , glimepiride .  A1c was up to 10.5% 06/2023.  Her daughter reports that her glucose control has improved.  Will repeat A1c, if remains elevated plan referral to endocrinology  RTC in 6 months  Medication Adjustments/Labs and Tests Ordered: Current medicines are reviewed at length with the patient today.  Concerns regarding medicines are outlined above.  Orders Placed This Encounter  Procedures   Lipid panel   Hemoglobin A1c   CBC   Comp Met (CMET)   EKG 12-Lead   No orders of the defined types were placed in this encounter.   Patient Instructions  Medication Instructions:  Your physician recommends that you continue on your current medications as directed. Please refer to the Current Medication list given to you today.  *If you need a refill on your cardiac medications before your next appointment, please call your pharmacy*  Lab Work: Bmet, cbc, lipid panel, A1c today If you have labs (blood work) drawn today and your tests are completely normal, you will receive your results only by: MyChart Message (if you have MyChart) OR A paper copy in the  mail If you have any lab test that is abnormal or we need to change your treatment, we will call you to review the results.  Testing/Procedures: none  Follow-Up: At [facility], you and your health needs are our priority.  As part of our continuing mission to provide you with exceptional heart care, our providers are all part of one team.  This team includes your primary Cardiologist (physician) and Advanced Practice Providers or APPs (Physician Assistants and Nurse Practitioners) who all work together to provide you with the care you need, when you need it.  Your next appointment:   6 months  Provider:   Dr. Kate  We recommend signing up for the patient portal called MyChart.  Sign up information is provided on this After Visit Summary.  MyChart is used to connect with patients for Virtual Visits (Telemedicine).  Patients are able to view lab/test results, encounter notes, upcoming appointments, etc.  Non-urgent messages can be sent to your provider as well.   To learn more about what you can do with MyChart, go to [email or web address].   Other Instructions NONE           Signed, Lonni LITTIE Kate, MD  03/13/2024 10:03 AM    [facility]

## 2024-03-14 ENCOUNTER — Telehealth: Payer: Self-pay | Admitting: Pharmacy Technician

## 2024-03-14 NOTE — Telephone Encounter (Signed)
farxiga

## 2024-03-14 NOTE — Telephone Encounter (Signed)
 Spoke with patients daughter Flowood. She confirmed atorvastatin ,carvedilol ,sertraline ,and loratadine  are to be fill through [facility],Other medications she would sent [facility] States medication is more cost effective.

## 2024-03-19 LAB — CBC

## 2024-03-19 LAB — LIPID PANEL

## 2024-03-20 ENCOUNTER — Ambulatory Visit: Payer: Self-pay | Admitting: Cardiology

## 2024-03-20 DIAGNOSIS — D582 Other hemoglobinopathies: Secondary | ICD-10-CM

## 2024-03-20 DIAGNOSIS — I10 Essential (primary) hypertension: Secondary | ICD-10-CM

## 2024-03-20 LAB — COMPREHENSIVE METABOLIC PANEL WITH GFR
ALT: 14 IU/L (ref 0–32)
AST: 17 IU/L (ref 0–40)
Albumin: 3.9 g/dL (ref 3.7–4.7)
Alkaline Phosphatase: 98 IU/L (ref 48–129)
BUN/Creatinine Ratio: 16 (ref 12–28)
BUN: 39 mg/dL — ABNORMAL HIGH (ref 8–27)
Bilirubin Total: <0.2 mg/dL (ref 0.0–1.2)
CO2: 18 mmol/L — ABNORMAL LOW (ref 20–29)
Calcium: 8.8 mg/dL (ref 8.7–10.3)
Chloride: 98 mmol/L (ref 96–106)
Creatinine, Ser: 2.42 mg/dL — ABNORMAL HIGH (ref 0.57–1.00)
Globulin, Total: 2.6 g/dL (ref 1.5–4.5)
Glucose: 268 mg/dL — ABNORMAL HIGH (ref 70–99)
Potassium: 4.7 mmol/L (ref 3.5–5.2)
Sodium: 132 mmol/L — ABNORMAL LOW (ref 134–144)
Total Protein: 6.5 g/dL (ref 6.0–8.5)
eGFR: 19 mL/min/1.73 — ABNORMAL LOW (ref >59)

## 2024-03-20 LAB — LIPID PANEL
Cholesterol, Total: 148 mg/dL (ref 100–199)
HDL: 54 mg/dL (ref >39)
LDL CALC COMMENT:: 2.7 ratio (ref 0.0–4.4)
LDL Chol Calc (NIH): 64 mg/dL (ref 0–99)
Triglycerides: 181 mg/dL — AB (ref 0–149)
VLDL Cholesterol Cal: 30 mg/dL (ref 5–40)

## 2024-03-20 LAB — HEMOGLOBIN A1C
Est. average glucose Bld gHb Est-mCnc: 203 mg/dL
Hgb A1c MFr Bld: 8.7 % — AB (ref 4.8–5.6)

## 2024-03-20 LAB — CBC
Hematocrit: 36.1 % (ref 34.0–46.6)
Hemoglobin: 11.4 g/dL (ref 11.1–15.9)
MCH: 28.1 pg (ref 26.6–33.0)
MCHC: 31.6 g/dL (ref 31.5–35.7)
MCV: 89 fL (ref 79–97)
Platelets: 238 x10E3/uL (ref 150–450)
RBC: 4.06 x10E6/uL (ref 3.77–5.28)
RDW: 13.3 % (ref 11.7–15.4)
WBC: 8.1 x10E3/uL (ref 3.4–10.8)

## 2024-03-29 ENCOUNTER — Encounter: Payer: Self-pay | Admitting: Internal Medicine

## 2024-04-03 ENCOUNTER — Other Ambulatory Visit: Payer: Self-pay | Admitting: *Deleted

## 2024-04-03 DIAGNOSIS — I10 Essential (primary) hypertension: Secondary | ICD-10-CM

## 2024-04-04 ENCOUNTER — Other Ambulatory Visit: Payer: Self-pay | Admitting: Internal Medicine

## 2024-04-04 ENCOUNTER — Ambulatory Visit: Payer: Self-pay | Admitting: Cardiology

## 2024-04-04 LAB — BASIC METABOLIC PANEL WITH GFR
BUN/Creatinine Ratio: 16 (ref 12–28)
BUN: 36 mg/dL — ABNORMAL HIGH (ref 8–27)
CO2: 17 mmol/L — ABNORMAL LOW (ref 20–29)
Calcium: 8.7 mg/dL (ref 8.7–10.3)
Chloride: 96 mmol/L (ref 96–106)
Creatinine, Ser: 2.31 mg/dL — ABNORMAL HIGH (ref 0.57–1.00)
Glucose: 150 mg/dL — ABNORMAL HIGH (ref 70–99)
Potassium: 4.9 mmol/L (ref 3.5–5.2)
Sodium: 129 mmol/L — ABNORMAL LOW (ref 134–144)
eGFR: 20 mL/min/1.73 — ABNORMAL LOW (ref >59)

## 2024-04-09 ENCOUNTER — Ambulatory Visit: Payer: Self-pay

## 2024-04-10 ENCOUNTER — Other Ambulatory Visit (HOSPITAL_COMMUNITY): Payer: Self-pay

## 2024-04-12 ENCOUNTER — Other Ambulatory Visit: Payer: Self-pay | Admitting: Internal Medicine

## 2024-04-13 NOTE — Telephone Encounter (Signed)
  Patient's daughter Raoul returning call. She can be reach at [phone]

## 2024-04-18 ENCOUNTER — Telehealth: Payer: Self-pay | Admitting: Internal Medicine

## 2024-04-19 ENCOUNTER — Ambulatory Visit: Payer: Self-pay | Admitting: Internal Medicine

## 2024-04-19 VITALS — BP 120/70 | HR 60 | Resp 16 | Ht <= 58 in | Wt 129.0 lb

## 2024-04-19 DIAGNOSIS — Z23 Encounter for immunization: Secondary | ICD-10-CM

## 2024-04-19 DIAGNOSIS — J3089 Other allergic rhinitis: Secondary | ICD-10-CM

## 2024-04-19 NOTE — Telephone Encounter (Signed)
 Spoke with Kyle , patient's daughter.  Daughter states patient is receiving all her medications through [facility] program, except for two that are at the Cardiologist office and has also another two medications that she can not remember name but they are at the [place] [facility].   Daughter states she has not picked up medications at [facility] anymore , daughter believes [facility] might have medication refill requests automatic and that is why office keeps receiving refills request

## 2024-04-19 NOTE — Progress Notes (Signed)
 Patient ID: Sheila White, female   DOB: 1940/07/09, 83 y.o.   MRN: [id number]  Cc- cough  Pt with Alzheimers. Lives with her daughter.    Daughter interpretes. Hx  is limited: Hx is from pt and daughter.  HPI and ROS-  2 weeks of cough , getting worse and more in frequency. No sputum. No fever.  No SOB.  During the night pt says lying down does not affect the cough.   Daughter says she does not hear the cough at night except maybe occaisonally.  Denied PND No N, V,  change in appetite No runny nose or sore throat. +Stuffy nose and burning in her throat.  + watery eyes 3 days  No chest pain. No change in baseline mental status; h/o alzhiemers which is stable on Danazol.  No change in sleep. No rash. No new medication. On Claritin  PRN and on Furosomide PRN for leg swelling. Was given 1 Claritin  last week. Not on ace inhibitors. Coricidin OTC given for past 2 days. It helped a little.  No  leg edema    No weight loss   Has not had vaccines for flu  PMH- reviewed on epic. Pertinants are: DM CHF- Class 3 w systolic and diastolic dysfunction Seasonal Allergies H/o cough H/O chest congestion  Past Surgical History:  Procedure Laterality Date   CORONARY/GRAFT ACUTE MI REVASCULARIZATION N/A 10/05/2017   Procedure: Coronary/Graft Acute MI Revascularization;  Surgeon: Burnard Debby LABOR, MD;  [facility]: [facility];  Service: Cardiovascular;  Laterality: N/A;   LEFT HEART CATH AND CORONARY ANGIOGRAPHY N/A 10/05/2017   Procedure: LEFT HEART CATH AND CORONARY ANGIOGRAPHY;  Surgeon: Burnard Debby LABOR, MD;  Location: [facility] INV[facility] CV LAB;  Service: Cardiovascular;  Laterality: N/A;    No Known Allergies  Meds  Lasix  20 mg every day prn leg edema Claritin  10 mg prn dry eyes Cloracidine OTC  Physical Exam Vitals and nursing note reviewed.  Constitutional:      General: She is not in acute distress.    Appearance: Normal appearance. She is not ill-appearing, toxic-appearing or  diaphoretic.  HENT:     Ears:     Comments: Cerumen B but can see Dr. Adella can see patent canals. Can hear finger rubbing at around 1/2 feet.  Can hear daughter talking to her loudly    Nose:     Comments: Patent, pale nasal mucosa per Dr. Adella Neck:     Comments: No JVD Cardiovascular:     Rate and Rhythm: Normal rate and regular rhythm.  Pulmonary:     Effort: Pulmonary effort is normal.     Comments: Dry crackles per Dr. Adella Musculoskeletal:     Comments: No LE edema  Skin:    General: Skin is warm and dry.  Neurological:     Mental Status: She is alert.     Comments: Walks w assistance. Normal speech. Smiles. Follows simple directions.  Answers questions with past information  Psychiatric:        Mood and Affect: Mood normal.     Comments: Slow to respond to questions.- baseline     A/P- 2 weeks of cough, watery eyes, nasal stuffiness with h/o allergies not taking Claritin  as prescribed. CHF ruled out clinically as there is no wet crackles per Dr. Adella, no JVD, no leg edema or sob or pnd.   -Stop Cloracidin -Start Claritin  10mg  every day. - flu shot given by Dr. Adella  -RTC PRN

## 2024-05-10 ENCOUNTER — Other Ambulatory Visit (HOSPITAL_COMMUNITY): Payer: Self-pay

## 2024-05-14 MED ORDER — LORATADINE 10 MG PO TABS: ORAL_TABLET | ORAL | 3 refills | Status: AC

## 2024-05-14 MED ORDER — DONEPEZIL HCL 5 MG PO TABS: 5.0000 mg | ORAL_TABLET | Freq: Every day | ORAL | Status: DC

## 2024-05-14 NOTE — Telephone Encounter (Signed)
 Attempted to call patient on number on file. Call does not go through

## 2024-05-17 NOTE — Telephone Encounter (Signed)
 Spoke with patient's daughter Kenton Vale.  She states that yes she wants levothyroxine  medication to go to [facility]. Also, patient's daughter is asking if Glimepiride  and Donepezil  can be sent to [facility] also, or if they are not accepted at [facility] if prescriptions can be sent to [facility] as daughter states she had problems at the pharmacy when she last refill. She was told they had cancel that medication, daughter states she does not know what exactly happened.

## 2024-05-28 ENCOUNTER — Telehealth: Payer: Self-pay | Admitting: Pharmacy Technician

## 2024-05-30 ENCOUNTER — Other Ambulatory Visit: Payer: Self-pay

## 2024-06-05 MED ORDER — DAPAGLIFLOZIN PROPANEDIOL 5 MG PO TABS: 5.0000 mg | ORAL_TABLET | Freq: Every day | ORAL | 2 refills | Status: AC

## 2024-06-06 ENCOUNTER — Other Ambulatory Visit: Payer: Self-pay | Admitting: Internal Medicine

## 2024-07-04 ENCOUNTER — Ambulatory Visit: Payer: Self-pay | Admitting: Internal Medicine

## 2024-07-04 ENCOUNTER — Encounter: Payer: Self-pay | Admitting: Internal Medicine

## 2024-07-04 VITALS — BP 134/70 | HR 64 | Resp 18 | Ht <= 58 in | Wt 122.5 lb

## 2024-07-04 DIAGNOSIS — E119 Type 2 diabetes mellitus without complications: Secondary | ICD-10-CM

## 2024-07-04 DIAGNOSIS — F03B0 Unspecified dementia, moderate, without behavioral disturbance, psychotic disturbance, mood disturbance, and anxiety: Secondary | ICD-10-CM

## 2024-07-04 DIAGNOSIS — R052 Subacute cough: Secondary | ICD-10-CM

## 2024-07-04 DIAGNOSIS — N189 Chronic kidney disease, unspecified: Secondary | ICD-10-CM

## 2024-07-04 DIAGNOSIS — D649 Anemia, unspecified: Secondary | ICD-10-CM

## 2024-07-04 MED ORDER — NITROGLYCERIN 0.4 MG SL SUBL: 0.4000 mg | SUBLINGUAL_TABLET | SUBLINGUAL | 3 refills | Status: AC | PRN

## 2024-07-04 MED ORDER — AMOXICILLIN 875 MG PO TABS: ORAL_TABLET | ORAL | 0 refills | Status: AC

## 2024-07-04 NOTE — Progress Notes (Signed)
 Patient seen with Dr. Fleta.  Agree with evaluation and assessment/plan

## 2024-07-04 NOTE — Progress Notes (Unsigned)
" ° ° °  Subjective:    Patient ID: Sheila White, female   DOB: [birth year], 84 y.o.   MRN: [id number]   HPI   Cough continues to be a problem.  See appt from 04/19/2024.  Cough resolved about 1 week after starting Claritin  after last visit.  She was doing well until she started coughing about 3 weeks ago.   Daughter states she started again with a cough that generally seems dry, but does cough up sticky clear to light yellow phlegm.  Will cough until gags and then coughs up mucous.  No fever.   She does note posterior pharyngeal drainage and sinus pressure as well.   She is somewhat limited with her fluid intake  2.  DM:  A1C was improved in September, but previously living with her son, who was allowing her to take in as much sugar as she wanted.  Sounds like she has been referred by cardiology to endocrine to discuss other options for treatment.  They are not interested in insulin  and patient with CKD and elderly.    3.  CAD:  New cardiologist discussing stopping Brilinta  soon.     Current Meds  Medication Sig   aspirin  81 MG chewable tablet Chew 1 tablet (81 mg total) by mouth daily.   atorvastatin  (LIPITOR ) 80 MG tablet Take 1 tablet (80 mg total) by mouth daily.   carvedilol  (COREG ) 3.125 MG tablet Take 1 tablet (3.125 mg total) by mouth 2 (two) times daily.   dapagliflozin  propanediol (FARXIGA ) 5 MG TABS tablet Take 1 tablet (5 mg total) by mouth daily before breakfast.   donepezil  (ARICEPT ) 5 MG tablet TAKE 1 TABLET BY MOUTH AT BEDTIME   furosemide  (LASIX ) 20 MG tablet Take 1 tablet (20 mg total) by mouth daily as needed for fluid.   glimepiride  (AMARYL ) 2 MG tablet Take 1 tablet (2 mg total) by mouth daily with breakfast.   levothyroxine  (SYNTHROID ) 25 MCG tablet Take 1/2 tablet (12.5 mcg total) by mouth daily.   loratadine  (CLARITIN ) 10 MG tablet 1 tab by mouth daily as needed for allergies   Olopatadine  HCl (PATADAY ) 0.2 % SOLN Place 1 drop into both eyes daily as  needed for eye allergies .   sertraline  (ZOLOFT ) 50 MG tablet Take 1/2 tablet (25 mg total) by mouth at bedtime.   ticagrelor  (BRILINTA ) 60 MG TABS tablet Take 1 tablet (60 mg total) by mouth 2 (two) times daily.   No Known Allergies   Review of Systems    Objective:   BP 134/70 (BP Location: Left Arm, Patient Position: Sitting, Cuff Size: Normal)   Pulse 64   Resp 18   Ht 4' 9 (1.448 m)   Wt 122 lb 8 oz (55.6 kg)   BMI 26.51 kg/m   Physical Exam   Assessment & Plan   "

## 2024-07-10 LAB — COMPREHENSIVE METABOLIC PANEL WITH GFR

## 2024-07-12 ENCOUNTER — Telehealth (HOSPITAL_BASED_OUTPATIENT_CLINIC_OR_DEPARTMENT_OTHER): Payer: Self-pay | Admitting: Licensed Clinical Social Worker

## 2024-07-12 ENCOUNTER — Other Ambulatory Visit: Payer: Self-pay | Admitting: Internal Medicine

## 2024-07-12 LAB — CBC WITH DIFFERENTIAL/PLATELET
Basophils Absolute: 0 x10E3/uL (ref 0.0–0.2)
Basos: 1 %
EOS (ABSOLUTE): 0.4 x10E3/uL (ref 0.0–0.4)
Eos: 6 %
Hematocrit: 38.1 % (ref 34.0–46.6)
Hemoglobin: 12.1 g/dL (ref 11.1–15.9)
Immature Grans (Abs): 0 x10E3/uL (ref 0.0–0.1)
Immature Granulocytes: 0 %
Lymphocytes Absolute: 2.1 x10E3/uL (ref 0.7–3.1)
Lymphs: 31 %
MCH: 28.9 pg (ref 26.6–33.0)
MCHC: 31.8 g/dL (ref 31.5–35.7)
MCV: 91 fL (ref 79–97)
Monocytes Absolute: 0.4 x10E3/uL (ref 0.1–0.9)
Monocytes: 6 %
Neutrophils Absolute: 3.9 x10E3/uL (ref 1.4–7.0)
Neutrophils: 56 %
Platelets: 240 x10E3/uL (ref 150–450)
RBC: 4.19 x10E6/uL (ref 3.77–5.28)
RDW: 13.8 % (ref 11.7–15.4)
WBC: 6.8 x10E3/uL (ref 3.4–10.8)

## 2024-07-12 LAB — COMPREHENSIVE METABOLIC PANEL WITH GFR

## 2024-07-12 LAB — HGB A1C W/O EAG: Hgb A1c MFr Bld: 9.5 % — ABNORMAL HIGH (ref 4.8–5.6)

## 2024-07-12 NOTE — Telephone Encounter (Signed)
 H&V Care Navigation CSW Progress Note  Clinical Social Worker received a call from pt daughter [name], speaks English) to inquire how to refill with [facility]. Discussed how to find number on med bottles and provided her with number verbally. Pt still receiving Farxiga  through [place] [facility] [place] through [place], unfortunately Brilinta  assistance programs ended in December. Even with GoodRX coupon is 400+ dollars. Will route to triage to see if appt needs to be made/alternative can be recommended.   Patient is participating in a Managed Medicaid Plan:  No, self pay only, CAFA  SDOH Screenings   Food Insecurity: No Food Insecurity (11/14/2023)  Housing: Low Risk (11/14/2023)  Transportation Needs: No Transportation Needs (11/14/2023)  Utilities: Not At Risk (11/14/2023)  Financial Resource Strain: Medium Risk (11/14/2023)  Tobacco Use: Low Risk (07/04/2024)  Health Literacy: Inadequate Health Literacy (11/14/2023)    Marit Lark, MSW, LCSW Clinical Social Worker II [facility] Health Heart/Vascular Care Navigation  [phone]- work cell phone (preferred)

## 2024-07-13 NOTE — Telephone Encounter (Signed)
 Given has been years since her PCI, okay to discontinue Brilinta .  Would continue aspirin  81 mg daily

## 2024-07-13 NOTE — Telephone Encounter (Signed)
 Spoke with daughter over the phone to advise her of Dr Morrell instructions. Pt daughter concerned stating that Dr Burnard told her she would have to take this medication until she died and is worried that its very unsafe to stop. Informed daughter that I would like Dr Kate know her concerns, but reiterated his instructions.

## 2024-07-17 NOTE — Telephone Encounter (Signed)
 Can we schedule her appointment for sometime in the next month to discuss?

## 2024-07-20 ENCOUNTER — Telehealth: Payer: Self-pay | Admitting: Internal Medicine

## 2024-07-20 MED ORDER — CARVEDILOL 3.125 MG PO TABS: 3.1250 mg | ORAL_TABLET | Freq: Two times a day (BID) | ORAL | 0 refills | Status: AC

## 2024-07-20 NOTE — Telephone Encounter (Signed)
 Patient's daughter called and states that she would like to know if Doctor can send a prescription for medication  carvedilol  (COREG ) 3.125 MG tablet [[id number]   Patient's daughter stated she normally gets medication from [facility] but she is unsure why there is a delay with medication and patient will be running out.  Would like to send prescription only temporarily for one month to  [facility] [address] [place] - [address] [place], [place] [address] Phone: [phone]  Fax: [phone]

## 2024-07-20 NOTE — Telephone Encounter (Signed)
Please notify sent

## 2024-07-26 ENCOUNTER — Other Ambulatory Visit (HOSPITAL_COMMUNITY): Payer: Self-pay

## 2024-07-27 ENCOUNTER — Other Ambulatory Visit (HOSPITAL_COMMUNITY): Payer: Self-pay

## 2024-11-05 ENCOUNTER — Ambulatory Visit: Payer: Self-pay | Admitting: Internal Medicine
